# Patient Record
Sex: Male | Born: 1950
Health system: Southern US, Community
[De-identification: ages and names within clinical notes are randomized; demographics above are authoritative.]

## PROBLEM LIST (undated history)

## (undated) DIAGNOSIS — Z9989 Dependence on other enabling machines and devices: Secondary | ICD-10-CM

## (undated) DIAGNOSIS — I4891 Unspecified atrial fibrillation: Secondary | ICD-10-CM

## (undated) DIAGNOSIS — G4733 Obstructive sleep apnea (adult) (pediatric): Secondary | ICD-10-CM

## (undated) DIAGNOSIS — I779 Disorder of arteries and arterioles, unspecified: Secondary | ICD-10-CM

## (undated) DIAGNOSIS — E785 Hyperlipidemia, unspecified: Secondary | ICD-10-CM

## (undated) DIAGNOSIS — I219 Acute myocardial infarction, unspecified: Secondary | ICD-10-CM

## (undated) DIAGNOSIS — R001 Bradycardia, unspecified: Secondary | ICD-10-CM

## (undated) DIAGNOSIS — I2510 Atherosclerotic heart disease of native coronary artery without angina pectoris: Secondary | ICD-10-CM

## (undated) DIAGNOSIS — I10 Essential (primary) hypertension: Secondary | ICD-10-CM

## (undated) DIAGNOSIS — I739 Peripheral vascular disease, unspecified: Secondary | ICD-10-CM

## (undated) DIAGNOSIS — K219 Gastro-esophageal reflux disease without esophagitis: Secondary | ICD-10-CM

## (undated) DIAGNOSIS — I483 Typical atrial flutter: Secondary | ICD-10-CM

## (undated) HISTORY — DX: Peripheral vascular disease, unspecified: I73.9

## (undated) HISTORY — DX: Unspecified atrial fibrillation: I48.91

## (undated) HISTORY — DX: Typical atrial flutter: I48.3

## (undated) HISTORY — PX: KNEE ARTHROSCOPY: SHX127

## (undated) HISTORY — DX: Disorder of arteries and arterioles, unspecified: I77.9

## (undated) HISTORY — DX: Acute myocardial infarction, unspecified: I21.9

## (undated) HISTORY — DX: Hyperlipidemia, unspecified: E78.5

## (undated) HISTORY — DX: Essential (primary) hypertension: I10

## (undated) HISTORY — PX: MOLE REMOVAL: SHX2046

## (undated) HISTORY — DX: Atherosclerotic heart disease of native coronary artery without angina pectoris: I25.10

## (undated) HISTORY — DX: Bradycardia, unspecified: R00.1

## (undated) HISTORY — DX: Gastro-esophageal reflux disease without esophagitis: K21.9

---

## 2000-03-08 HISTORY — PX: CORONARY ANGIOPLASTY WITH STENT PLACEMENT: SHX49

## 2002-01-19 ENCOUNTER — Ambulatory Visit (HOSPITAL_COMMUNITY): Admission: RE | Admit: 2002-01-19 | Discharge: 2002-01-19 | Payer: Self-pay | Admitting: Cardiology

## 2002-01-19 HISTORY — PX: CARDIAC CATHETERIZATION: SHX172

## 2002-06-26 ENCOUNTER — Encounter: Payer: Self-pay | Admitting: Vascular Surgery

## 2002-06-26 ENCOUNTER — Inpatient Hospital Stay (HOSPITAL_COMMUNITY): Admission: RE | Admit: 2002-06-26 | Discharge: 2002-06-27 | Payer: Self-pay | Admitting: Vascular Surgery

## 2002-06-26 ENCOUNTER — Encounter (INDEPENDENT_AMBULATORY_CARE_PROVIDER_SITE_OTHER): Payer: Self-pay | Admitting: *Deleted

## 2002-06-26 HISTORY — PX: CAROTID ENDARTERECTOMY: SUR193

## 2006-02-02 ENCOUNTER — Ambulatory Visit: Payer: Self-pay | Admitting: *Deleted

## 2006-02-02 ENCOUNTER — Observation Stay (HOSPITAL_COMMUNITY): Admission: EM | Admit: 2006-02-02 | Discharge: 2006-02-02 | Payer: Self-pay | Admitting: Emergency Medicine

## 2006-02-02 ENCOUNTER — Ambulatory Visit: Payer: Self-pay | Admitting: Cardiology

## 2006-02-02 HISTORY — PX: CARDIAC CATHETERIZATION: SHX172

## 2006-02-16 ENCOUNTER — Ambulatory Visit: Payer: Self-pay | Admitting: *Deleted

## 2006-03-25 ENCOUNTER — Ambulatory Visit: Payer: Self-pay | Admitting: Internal Medicine

## 2006-11-02 ENCOUNTER — Ambulatory Visit: Payer: Self-pay | Admitting: Cardiology

## 2007-05-31 ENCOUNTER — Ambulatory Visit: Payer: Self-pay | Admitting: Cardiology

## 2007-05-31 ENCOUNTER — Ambulatory Visit: Payer: Self-pay | Admitting: Internal Medicine

## 2007-05-31 LAB — CONVERTED CEMR LAB
BUN: 17 mg/dL
Basophils Absolute: 0.1 10*3/uL
Basophils Relative: 1 %
CO2: 33 meq/L — ABNORMAL HIGH
Calcium: 9.8 mg/dL
Chloride: 109 meq/L
Creatinine, Ser: 1 mg/dL
Eosinophils Absolute: 0.1 10*3/uL
Eosinophils Relative: 2 %
GFR calc Af Amer: 100 mL/min
GFR calc non Af Amer: 83 mL/min
Glucose, Bld: 98 mg/dL
HCT: 43.8 %
Hemoglobin: 14.9 g/dL
INR: 1
Lymphocytes Relative: 19.5 %
MCHC: 34.1 g/dL
MCV: 90 fL
Monocytes Absolute: 0.8 10*3/uL — ABNORMAL HIGH
Monocytes Relative: 10.5 %
Neutro Abs: 4.8 10*3/uL
Neutrophils Relative %: 67 %
Platelets: 216 10*3/uL
Potassium: 3.8 meq/L
Prothrombin Time: 12.3 s
RBC: 4.87 M/uL
RDW: 12.4 %
Sodium: 146 meq/L — ABNORMAL HIGH
TSH: 0.82 u[IU]/mL
WBC: 7.2 10*3/uL
aPTT: 29.2 s

## 2007-06-01 ENCOUNTER — Inpatient Hospital Stay (HOSPITAL_BASED_OUTPATIENT_CLINIC_OR_DEPARTMENT_OTHER): Admission: RE | Admit: 2007-06-01 | Discharge: 2007-06-01 | Payer: Self-pay | Admitting: Cardiology

## 2007-06-01 ENCOUNTER — Ambulatory Visit: Payer: Self-pay | Admitting: Cardiology

## 2007-06-01 HISTORY — PX: CARDIAC CATHETERIZATION: SHX172

## 2007-10-08 ENCOUNTER — Encounter: Payer: Self-pay | Admitting: *Deleted

## 2007-10-08 DIAGNOSIS — I10 Essential (primary) hypertension: Secondary | ICD-10-CM

## 2007-10-08 DIAGNOSIS — K219 Gastro-esophageal reflux disease without esophagitis: Secondary | ICD-10-CM

## 2007-10-08 DIAGNOSIS — E785 Hyperlipidemia, unspecified: Secondary | ICD-10-CM | POA: Insufficient documentation

## 2007-10-08 DIAGNOSIS — Z9889 Other specified postprocedural states: Secondary | ICD-10-CM | POA: Insufficient documentation

## 2007-10-08 DIAGNOSIS — R0989 Other specified symptoms and signs involving the circulatory and respiratory systems: Secondary | ICD-10-CM | POA: Insufficient documentation

## 2007-10-08 HISTORY — DX: Gastro-esophageal reflux disease without esophagitis: K21.9

## 2007-10-08 HISTORY — DX: Essential (primary) hypertension: I10

## 2007-11-28 ENCOUNTER — Encounter: Payer: Self-pay | Admitting: Internal Medicine

## 2007-11-30 ENCOUNTER — Ambulatory Visit: Payer: Self-pay | Admitting: Cardiology

## 2007-12-09 ENCOUNTER — Ambulatory Visit: Payer: Self-pay | Admitting: Cardiology

## 2007-12-09 ENCOUNTER — Ambulatory Visit: Payer: Self-pay

## 2007-12-09 ENCOUNTER — Encounter: Payer: Self-pay | Admitting: Internal Medicine

## 2007-12-09 LAB — CONVERTED CEMR LAB
BUN: 15 mg/dL (ref 6–23)
CO2: 32 meq/L (ref 19–32)
Calcium: 9 mg/dL (ref 8.4–10.5)
Chloride: 110 meq/L (ref 96–112)
Creatinine, Ser: 1.3 mg/dL (ref 0.4–1.5)
GFR calc Af Amer: 74 mL/min
GFR calc non Af Amer: 61 mL/min
Glucose, Bld: 106 mg/dL — ABNORMAL HIGH (ref 70–99)
Potassium: 4.1 meq/L (ref 3.5–5.1)
Sodium: 145 meq/L (ref 135–145)

## 2008-01-13 ENCOUNTER — Ambulatory Visit: Payer: Self-pay | Admitting: Cardiology

## 2008-02-03 ENCOUNTER — Ambulatory Visit: Payer: Self-pay | Admitting: Pulmonary Disease

## 2008-02-03 DIAGNOSIS — I219 Acute myocardial infarction, unspecified: Secondary | ICD-10-CM

## 2008-02-03 DIAGNOSIS — G4733 Obstructive sleep apnea (adult) (pediatric): Secondary | ICD-10-CM | POA: Insufficient documentation

## 2008-02-03 HISTORY — DX: Obstructive sleep apnea (adult) (pediatric): G47.33

## 2008-02-03 HISTORY — DX: Acute myocardial infarction, unspecified: I21.9

## 2008-02-04 ENCOUNTER — Encounter: Payer: Self-pay | Admitting: Pulmonary Disease

## 2008-02-04 ENCOUNTER — Ambulatory Visit (HOSPITAL_BASED_OUTPATIENT_CLINIC_OR_DEPARTMENT_OTHER): Admission: RE | Admit: 2008-02-04 | Discharge: 2008-02-04 | Payer: Self-pay | Admitting: Pulmonary Disease

## 2008-02-21 ENCOUNTER — Ambulatory Visit: Payer: Self-pay | Admitting: Pulmonary Disease

## 2008-02-22 ENCOUNTER — Telehealth (INDEPENDENT_AMBULATORY_CARE_PROVIDER_SITE_OTHER): Payer: Self-pay | Admitting: *Deleted

## 2008-04-11 ENCOUNTER — Ambulatory Visit: Payer: Self-pay | Admitting: Pulmonary Disease

## 2008-05-04 ENCOUNTER — Telehealth: Payer: Self-pay | Admitting: Pulmonary Disease

## 2008-06-07 ENCOUNTER — Encounter: Payer: Self-pay | Admitting: Pulmonary Disease

## 2008-07-17 ENCOUNTER — Telehealth: Payer: Self-pay | Admitting: Internal Medicine

## 2008-07-18 ENCOUNTER — Ambulatory Visit: Payer: Self-pay | Admitting: Pulmonary Disease

## 2008-12-10 ENCOUNTER — Encounter: Payer: Self-pay | Admitting: Cardiology

## 2008-12-10 ENCOUNTER — Ambulatory Visit: Payer: Self-pay

## 2008-12-10 ENCOUNTER — Ambulatory Visit: Payer: Self-pay | Admitting: Cardiology

## 2008-12-10 LAB — CONVERTED CEMR LAB
ALT: 41 U/L (ref 0–53)
AST: 28 U/L (ref 0–37)
Albumin: 4.1 g/dL (ref 3.5–5.2)
Alkaline Phosphatase: 53 U/L (ref 39–117)
BUN: 16 mg/dL (ref 6–23)
Basophils Absolute: 0.1 K/uL (ref 0.0–0.1)
Basophils Relative: 0.9 % (ref 0.0–3.0)
Bilirubin, Direct: 0.1 mg/dL (ref 0.0–0.3)
CO2: 30 meq/L (ref 19–32)
Calcium: 9.1 mg/dL (ref 8.4–10.5)
Chloride: 107 meq/L (ref 96–112)
Cholesterol: 197 mg/dL (ref 0–200)
Creatinine, Ser: 1 mg/dL (ref 0.4–1.5)
Eosinophils Absolute: 0.2 K/uL (ref 0.0–0.7)
Eosinophils Relative: 3.1 % (ref 0.0–5.0)
GFR calc Af Amer: 99 mL/min
GFR calc non Af Amer: 82 mL/min
Glucose, Bld: 94 mg/dL (ref 70–99)
HCT: 39.3 % (ref 39.0–52.0)
HDL: 23.8 mg/dL — ABNORMAL LOW (ref >39.0)
Hemoglobin: 13.4 g/dL (ref 13.0–17.0)
LDL Cholesterol: 145 mg/dL — ABNORMAL HIGH (ref 0–99)
Lymphocytes Relative: 28.6 % (ref 12.0–46.0)
MCHC: 34.2 g/dL (ref 30.0–36.0)
MCV: 88.9 fL (ref 78.0–100.0)
Monocytes Absolute: 0.6 K/uL (ref 0.1–1.0)
Monocytes Relative: 10.9 % (ref 3.0–12.0)
Neutro Abs: 3.1 K/uL (ref 1.4–7.7)
Neutrophils Relative %: 56.5 % (ref 43.0–77.0)
Platelets: 178 K/uL (ref 150–400)
Potassium: 3.9 meq/L (ref 3.5–5.1)
RBC: 4.42 M/uL (ref 4.22–5.81)
RDW: 12.1 % (ref 11.5–14.6)
Sodium: 144 meq/L (ref 135–145)
Total Bilirubin: 1.4 mg/dL — ABNORMAL HIGH (ref 0.3–1.2)
Total CHOL/HDL Ratio: 8.3
Total Protein: 6.8 g/dL (ref 6.0–8.3)
Triglycerides: 143 mg/dL (ref 0–149)
VLDL: 29 mg/dL (ref 0–40)
WBC: 5.6 10*3/microliter (ref 4.5–10.5)

## 2008-12-21 ENCOUNTER — Telehealth: Payer: Self-pay | Admitting: Gastroenterology

## 2008-12-25 ENCOUNTER — Ambulatory Visit: Payer: Self-pay | Admitting: Gastroenterology

## 2008-12-25 DIAGNOSIS — I2510 Atherosclerotic heart disease of native coronary artery without angina pectoris: Secondary | ICD-10-CM | POA: Insufficient documentation

## 2008-12-25 DIAGNOSIS — R079 Chest pain, unspecified: Secondary | ICD-10-CM | POA: Insufficient documentation

## 2008-12-25 DIAGNOSIS — M79609 Pain in unspecified limb: Secondary | ICD-10-CM | POA: Insufficient documentation

## 2008-12-25 HISTORY — DX: Atherosclerotic heart disease of native coronary artery without angina pectoris: I25.10

## 2008-12-26 ENCOUNTER — Encounter: Payer: Self-pay | Admitting: Gastroenterology

## 2008-12-31 ENCOUNTER — Ambulatory Visit: Payer: Self-pay | Admitting: Internal Medicine

## 2008-12-31 DIAGNOSIS — IMO0001 Reserved for inherently not codable concepts without codable children: Secondary | ICD-10-CM | POA: Insufficient documentation

## 2008-12-31 LAB — CONVERTED CEMR LAB
ALT: 36 units/L (ref 0–53)
ALT: 36 units/L (ref 0–53)
AST: 23 units/L (ref 0–37)
AST: 23 units/L (ref 0–37)
Albumin: 4.2 g/dL (ref 3.5–5.2)
Albumin: 4.2 g/dL (ref 3.5–5.2)
Alkaline Phosphatase: 57 units/L (ref 39–117)
Alkaline Phosphatase: 57 units/L (ref 39–117)
BUN: 16 mg/dL (ref 6–23)
BUN: 16 mg/dL (ref 6–23)
Bilirubin, Direct: 0.2 mg/dL (ref 0.0–0.3)
Bilirubin, Direct: 0.2 mg/dL (ref 0.0–0.3)
CK-MB: 1.1 ng/mL (ref 0.3–4.0)
CK-MB: 1.1 ng/mL (ref 0.3–4.0)
CO2: 32 meq/L (ref 19–32)
CO2: 32 meq/L (ref 19–32)
Calcium: 9.8 mg/dL (ref 8.4–10.5)
Calcium: 9.8 mg/dL (ref 8.4–10.5)
Chloride: 104 meq/L (ref 96–112)
Chloride: 104 meq/L (ref 96–112)
Cholesterol, target level: 200 mg/dL
Creatinine, Ser: 1.2 mg/dL (ref 0.4–1.5)
Creatinine, Ser: 1.2 mg/dL (ref 0.4–1.5)
GFR calc Af Amer: 81 mL/min
GFR calc Af Amer: 81 mL/min
GFR calc non Af Amer: 67 mL/min
GFR calc non Af Amer: 67 mL/min
Glucose, Bld: 116 mg/dL — ABNORMAL HIGH (ref 70–99)
Glucose, Bld: 116 mg/dL — ABNORMAL HIGH (ref 70–99)
HDL goal, serum: 40 mg/dL
LDL Goal: 100 mg/dL
Potassium: 4.9 meq/L (ref 3.5–5.1)
Potassium: 4.9 meq/L (ref 3.5–5.1)
Sed Rate: 21 mm/hr — ABNORMAL HIGH (ref 0–16)
Sed Rate: 21 mm/hr — ABNORMAL HIGH (ref 0–16)
Sodium: 142 meq/L (ref 135–145)
Sodium: 142 meq/L (ref 135–145)
Total Bilirubin: 1.3 mg/dL — ABNORMAL HIGH (ref 0.3–1.2)
Total Bilirubin: 1.3 mg/dL — ABNORMAL HIGH (ref 0.3–1.2)
Total CK: 109 units/L (ref 7–195)
Total Protein: 7.3 g/dL (ref 6.0–8.3)
Total Protein: 7.3 g/dL (ref 6.0–8.3)

## 2009-03-28 ENCOUNTER — Telehealth: Payer: Self-pay | Admitting: Cardiology

## 2009-04-12 ENCOUNTER — Telehealth: Payer: Self-pay | Admitting: Cardiology

## 2009-04-12 ENCOUNTER — Ambulatory Visit: Payer: Self-pay

## 2009-04-12 ENCOUNTER — Ambulatory Visit: Payer: Self-pay | Admitting: Cardiology

## 2009-04-12 DIAGNOSIS — I498 Other specified cardiac arrhythmias: Secondary | ICD-10-CM | POA: Insufficient documentation

## 2009-08-25 ENCOUNTER — Encounter: Payer: Self-pay | Admitting: Cardiology

## 2009-11-12 ENCOUNTER — Telehealth: Payer: Self-pay | Admitting: Cardiology

## 2009-12-11 ENCOUNTER — Ambulatory Visit: Payer: Self-pay | Admitting: Cardiology

## 2009-12-11 DIAGNOSIS — R5383 Other fatigue: Secondary | ICD-10-CM

## 2009-12-11 DIAGNOSIS — R5381 Other malaise: Secondary | ICD-10-CM | POA: Insufficient documentation

## 2009-12-18 ENCOUNTER — Telehealth (INDEPENDENT_AMBULATORY_CARE_PROVIDER_SITE_OTHER): Payer: Self-pay | Admitting: *Deleted

## 2009-12-19 ENCOUNTER — Ambulatory Visit: Payer: Self-pay | Admitting: Internal Medicine

## 2009-12-19 ENCOUNTER — Encounter (HOSPITAL_COMMUNITY)
Admission: RE | Admit: 2009-12-19 | Discharge: 2010-02-18 | Payer: Self-pay | Source: Home / Self Care | Admitting: Cardiology

## 2009-12-19 ENCOUNTER — Ambulatory Visit: Payer: Self-pay

## 2009-12-19 ENCOUNTER — Ambulatory Visit: Payer: Self-pay | Admitting: Cardiology

## 2009-12-20 ENCOUNTER — Telehealth: Payer: Self-pay | Admitting: Cardiology

## 2009-12-20 LAB — CONVERTED CEMR LAB
ALT: 25 units/L (ref 0–53)
AST: 22 units/L (ref 0–37)
Albumin: 4 g/dL (ref 3.5–5.2)
Alkaline Phosphatase: 62 units/L (ref 39–117)
BUN: 16 mg/dL (ref 6–23)
Basophils Absolute: 0 10*3/uL (ref 0.0–0.1)
Basophils Relative: 0.4 % (ref 0.0–3.0)
Bilirubin, Direct: 0.1 mg/dL (ref 0.0–0.3)
CO2: 31 meq/L (ref 19–32)
Calcium: 9.5 mg/dL (ref 8.4–10.5)
Chloride: 110 meq/L (ref 96–112)
Cholesterol: 157 mg/dL (ref 0–200)
Creatinine, Ser: 1.1 mg/dL (ref 0.4–1.5)
Eosinophils Absolute: 0.2 10*3/uL (ref 0.0–0.7)
Eosinophils Relative: 3.3 % (ref 0.0–5.0)
GFR calc non Af Amer: 73.3 mL/min (ref >60)
Glucose, Bld: 103 mg/dL — ABNORMAL HIGH (ref 70–99)
HCT: 39.6 % (ref 39.0–52.0)
HDL: 31.1 mg/dL — ABNORMAL LOW (ref >39.00)
Hemoglobin: 13.3 g/dL (ref 13.0–17.0)
LDL Cholesterol: 100 mg/dL — ABNORMAL HIGH (ref 0–99)
Lymphocytes Relative: 15.9 % (ref 12.0–46.0)
Lymphs Abs: 1 10*3/uL (ref 0.7–4.0)
MCHC: 33.6 g/dL (ref 30.0–36.0)
MCV: 90.1 fL (ref 78.0–100.0)
Monocytes Absolute: 0.7 10*3/uL (ref 0.1–1.0)
Monocytes Relative: 10.6 % (ref 3.0–12.0)
Neutro Abs: 4.4 10*3/uL (ref 1.4–7.7)
Neutrophils Relative %: 69.8 % (ref 43.0–77.0)
Platelets: 160 10*3/uL (ref 150.0–400.0)
Potassium: 5.4 meq/L — ABNORMAL HIGH (ref 3.5–5.1)
RBC: 4.4 M/uL (ref 4.22–5.81)
RDW: 11.9 % (ref 11.5–14.6)
Sodium: 145 meq/L (ref 135–145)
TSH: 0.81 microintl units/mL (ref 0.35–5.50)
Total Bilirubin: 0.9 mg/dL (ref 0.3–1.2)
Total CHOL/HDL Ratio: 5
Total Protein: 7 g/dL (ref 6.0–8.3)
Triglycerides: 131 mg/dL (ref 0.0–149.0)
VLDL: 26.2 mg/dL (ref 0.0–40.0)
WBC: 6.3 10*3/uL (ref 4.5–10.5)

## 2009-12-25 ENCOUNTER — Telehealth: Payer: Self-pay | Admitting: Cardiology

## 2009-12-31 ENCOUNTER — Telehealth: Payer: Self-pay | Admitting: Cardiology

## 2010-03-10 ENCOUNTER — Telehealth: Payer: Self-pay | Admitting: Cardiology

## 2010-03-14 ENCOUNTER — Telehealth (INDEPENDENT_AMBULATORY_CARE_PROVIDER_SITE_OTHER): Payer: Self-pay | Admitting: *Deleted

## 2010-04-23 ENCOUNTER — Encounter (INDEPENDENT_AMBULATORY_CARE_PROVIDER_SITE_OTHER): Payer: Self-pay | Admitting: *Deleted

## 2010-05-30 ENCOUNTER — Encounter: Payer: Self-pay | Admitting: Cardiology

## 2010-07-31 ENCOUNTER — Telehealth (INDEPENDENT_AMBULATORY_CARE_PROVIDER_SITE_OTHER): Payer: Self-pay | Admitting: *Deleted

## 2010-11-18 NOTE — Progress Notes (Signed)
Summary: pt needs a generic med  Phone Note Call from Patient Call back at [phone]   Caller: Spouse Reason for Call: Talk to Nurse, Talk to Doctor Summary of Call: pt went to get his lipitor 80mg  and the price has gone up so high they just wanted to know is there something else he could take Initial call taken by: Omer Jack,  Mar 10, 2010 8:59 AM  Follow-up for Phone Call        I called and spoke with the pt's wife. I made her aware that we can probably change the pt to something generic, but the pt may not get as good of control on his lipids. I will discuss with Dr. Juanda Chance and call her back tomorrow.  Follow-up by: Sherri Rad, RN, BSN,  Mar 10, 2010 10:33 AM  Additional Follow-up for Phone Call Additional follow up Details #1::        Dr. Juanda Chance would like the pt to remain on lipitor. I will look at the $4 card and call the pt's wife back. Sherri Rad, RN, BSN  Mar 11, 2010 6:11 PM     Additional Follow-up for Phone Call Additional follow up Details #2::    wife calling again, [phone], Migdalia Dk  Mar 12, 2010 8:10 AM   Additional Follow-up for Phone Call Additional follow up Details #3:: Details for Additional Follow-up Action Taken: Called pt's wife back and advised that Herbert Seta and Dr.BB will be in tomorrow and look at med list. I advised her that Lipitor will go generic in about 7 months but she states that right now they cannot afford the 133.00 per month.  Additional Follow-up by: J REISS RN   Appended Document: pt needs a generic med I called Mrs. Vick at her contact # and left a message for the [phone] # or website of www. [email or web address].com/save so they can investigate the $4 co-pay card. Per Dr. Juanda Chance, he would like the pt to stay on lipitor if possible. I have also left a message that if the $4 co-pay card does not work out for him, to call us back with a list of meds that his insurance will cover at a lower co-pay.

## 2010-11-18 NOTE — Progress Notes (Signed)
Summary: sooner appt request  Phone Note Call from Patient Call back at [phone]   Caller: Spouse Call For: Dr. Russella Dar Reason for Call: Talk to Nurse Details for Reason: sooner appt request Summary of Call: pt has appt on the 9th... pt will be going out of town long term soon and wife wants to know if pt can have tests ordered without having to wait for upsoming consultation... explained to wife that doctor cannot order tests blindly with no evaluation of a patient that he has also never seen before...  Initial call taken by: Vallarie Mare,  December 21, 2008 8:22 AM  Follow-up for Phone Call        patient has never been seen here before, he has CP and has been referred by Cardiology.  Wife wants to have testing ordered prior to office visit on Tuesday.  Advised her that he need to come in and be examined and then Dr Russella Dar will determine what tests need to be performed. Follow-up by: Darcey Nora RN,  December 21, 2008 8:57 AM

## 2010-11-18 NOTE — Letter (Signed)
Summary: Results Follow-up Letter  [facility]  [address] [place], [place] [address]   Phone: [phone]  Fax: [phone]    12/31/2008  [address] [place], [place]  [address]  Dear Mr. HARTSTEIN,   The following are the results of your recent test(s):  Test     Result     blood sugar     slightly elevated liver/kidney   normal muscle enzyme   normal   _________________________________________________________  Please call for an appointment as directed _________________________________________________________ _________________________________________________________ _________________________________________________________  Sincerely,  Sanda Linger MD [facility]

## 2010-11-18 NOTE — Progress Notes (Signed)
  Faxed ROI over to [facility] @ [phone] [facility]  July 31, 2010 8:49 AM     Appended Document:  Release faxed to [facility] not [facility], records received back gave to [place]

## 2010-11-18 NOTE — Assessment & Plan Note (Signed)
Summary: rov for osa   Referred by:  Charlies Constable  Chief Complaint:  Pt is here for a follow up appt.  Pt states he uses his cpap machine every night- approx 8 hours per night.  Pt denied any new complaints. .  History of Present Illness: pt doing well with cpap.  He is sleeping well, and has seen improved daytime alertness.  He is having no difficulties with the mask or cpap pressure.  He comes in today for his yearly  visit.     Current Allergies: * NIASPAN     Review of Systems      See HPI   Vital Signs:  Patient Profile:   60 Years Old Male Weight:      208.25 pounds O2 Sat:      97 % O2 treatment:    Room Air Temp:     97.9 degrees F oral Pulse rate:   71 / minute BP sitting:   122 / 86  (right arm) Cuff size:   regular  Vitals Entered By: Cyndia Diver LPN (July 18, 2008 2:46 PM)             Comments Medications reviewed with patient Cyndia Diver LPN  July 18, 2008 2:46 PM      Physical Exam  General:     wd male in nad Nose:     no skin breakdown or pressure necrosis from the cpap mask      Impression & Recommendations:  Problem # 1:  OBSTRUCTIVE SLEEP APNEA (ICD-327.23) the pt is doing well with cpap, and has seen the benefit from therapy.  I have asked him to continue on cpap, and work on modest weight loss.  We will have his machine set to 11cm based on 2 wks of data from autotitrating device.   Patient Instructions: 1)  Please schedule a follow-up appointment in 1 year.   ]

## 2010-11-18 NOTE — Progress Notes (Signed)
Summary: refill folbee  Phone Note Refill Request   Refills Requested: Medication #1:  FOLIC ACID-VIT B6-VIT B12 0.4-50-0.1 MG  TABS Take 1 tablet by mouth once a day   Supply Requested: 1 year [employer]   Method Requested: Fax to Local Pharmacy Initial call taken by: Migdalia Dk,  November 12, 2009 12:12 PM  Follow-up for Phone Call        pharmacy called to verfiy med explained -which folbic pt is on . I called to see if he received his med ,there was no anwer so I left message for pt to call me back to make sure He got his med Follow-up by: Burnett Kanaris, CNA,  November 15, 2009 12:52 PM

## 2010-11-18 NOTE — Progress Notes (Signed)
Summary: set up f/u appt   ---- Converted from flag ---- ---- 12/28/2009 12:18 AM, Lenoria Farrier, MD, [facility] wrote: Herbert Seta, Can you schedule an apt week after next.  WE may need to consider cath. BB  ---- 12/25/2009 12:42 PM, Sherri Rad, RN, BSN wrote: Please review 12/25/09 phone note. ------------------------------  Phone Note Outgoing Call   Call placed by: Sherri Rad, RN, BSN,  December 31, 2009 12:42 PM Call placed to: spouse Summary of Call: I called and left Mrs. Lor a message to call back regarding setting up Mr. Ambs for an appt. Sherri Rad, RN, BSN  December 31, 2009 12:42 PM   Follow-up for Phone Call        returning [phone] or [phone], Migdalia Dk  January 01, 2010 12:08 PM   The pt's wife called back. She states the pt has had caths before that have been negative. He is very frustrated with not being able to find out what is going on. The pt is still out of town. I explained to Mrs. Francisco to talk with the pt. If he wants to see Dr. Juanda Chance when he comes back in town we will set his appt. up. If he wants to wait it out for now, then that is fine. Mrs. Hartog will talk to the pt. and they will call me back when they are ready with what they want to do. Follow-up by: Sherri Rad, RN, BSN,  January 01, 2010 4:06 PM

## 2010-11-18 NOTE — Progress Notes (Signed)
Summary: Patient's at Home Med List  Patient's at Home Med List   Imported By: Marylou Mccoy 01/31/2010 13:42:18  _____________________________________________________________________  External Attachment:    Type:   Image     Comment:   External Document

## 2010-11-18 NOTE — Assessment & Plan Note (Signed)
Summary: yearly/sl   Visit Type:  Follow-up Referring Provider:  Charlies Constable, MD Primary Provider:  Illene Regulus MD  CC:  no complaints.  History of Present Illness: The patient is 60 years old and return for management of CAD. In 2002 he had a bare metal stent placed to the right coronary artery. A catheterization in 2008 which showed nondestructive disease. Had a Myoview scan in 2009 which was negative for ischemia.  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 He says he is having increased fatigue over the last several weeks and he also says he has had some diaphoresis at night. He was concerned about his heart rate got some of the symptoms may be related to working harder  His past history is significant for hypertension and hyperlipidemia. He also had previous carotid enterectomy. He had duplex studies of his carotid one years ago with mild disease and followup recommended 2 years from that point.  Current Medications (verified): 1)  Altace 10 Mg Caps (Ramipril) .... Take 1 Capsule Bid 2)  Hydrochlorothiazide 50 Mg Tabs (Hydrochlorothiazide) .... Take 1/2 Tab Once Daily 3)  K-Tabs 10 Meq  Tbcr (Potassium Chloride) .... Take 2 Tabs Two Times A Day 4)  Tenormin 50 Mg  Tabs (Atenolol) .... Take 1/2 Tab By Mouth Once Daily 5)  Plavix 75 Mg  Tabs (Clopidogrel Bisulfate) .... Take One Tablet Once Daily 6)  Baby Aspirin 81 Mg Chew (Aspirin) 7)  Nitroquick 0.4 Mg  Subl (Nitroglycerin) .... Take Sl As As Needed and As Directed For Chest Pains. 8)  Amlodipine Besylate 5 Mg  Tabs (Amlodipine Besylate) .... Take 1 Tablet By Mouth Once A Day 9)  Prilosec 40 Mg Cpdr (Omeprazole) .Marland Kitchen.. 1 Tab Once Daily 10)  Folic Acid-Vit B6-Vit B12 0.4-50-0.1 Mg  Tabs (Folic Acid-Vit B6-Vit B12) .... Take 1 Tablet By Mouth Once A Day 11)  Imdur 30 Mg Xr24h-Tab (Isosorbide Mononitrate) .... Take 1 Tablet By Mouth Once A Day 12)  Lipitor 80 Mg Tabs (Atorvastatin Calcium)  .... Once Daily  Allergies (verified): 1)  * Niaspan  Past History:  Past Medical History: Reviewed history from 04/12/2009 and no changes required. Current Problems:  CORONARY ARTERY DISEASE (ICD-414.00) Hx of MYOCARDIAL INFARCTION (ICD-410.90) CHEST PAIN (ICD-786.50) BRADYCARDIA (ICD-427.89) HYPERTENSION (ICD-401.9) CAROTID BRUIT, RIGHT (ICD-785.9) HYPERLIPIDEMIA (ICD-272.4) GASTROESOPHAGEAL REFLUX DISEASE (ICD-530.81) MUSCLE PAIN (ICD-729.1) LEG PAIN (ICD-729.5) SPECIAL SCREENING FOR MALIGNANT NEOPLASMS COLON (ICD-V76.51) OBSTRUCTIVE SLEEP APNEA (ICD-327.23) ARTHROSCOPY, RIGHT KNEE, HX OF (ICD-V45.89) CAROTID ENDARTERECTOMY, RIGHT, HX OF (ICD-V15.1)  Review of Systems       ROS is negative except as outlined in HPI.   Vital Signs:  Patient profile:   60 year old male Height:      73 inches Weight:      193 pounds BMI:     25.56 Pulse rate:   67 / minute BP sitting:   120 / 73  (left arm) Cuff size:   large  Vitals Entered By: Burnett Kanaris, CNA (December 11, 2009 2:42 PM)  Physical Exam  Additional Exam:  Gen. Well-nourished, in no distress   Neck: No JVD, thyroid not enlarged, carotid endarterectomy scar Lungs: No tachypnea, clear without rales, rhonchi or wheezes Cardiovascular: Rhythm regular, PMI not displaced,  heart sounds  normal, no murmurs or gallops, no peripheral edema, pulses normal in all 4 extremities. Abdomen: BS normal, abdomen soft and non-tender without masses or organomegaly, no hepatosplenomegaly. MS: No deformities, no cyanosis or clubbing  Neuro:  No focal sns   Skin:  no lesions    Impression & Recommendations:  Problem # 1:  FATIGUE / MALAISE (ICD-780.79)  The patient has had symptoms of fatigue and diaphoresis. These could possibly bet is an ischemic equivalent. We will plan to evaluate this further with a rest chest exercise Myoview scan. We'll also get some blood work to evaluate for symptoms of fatigue. If all this is negative  we won't pursue further evaluation.  Orders: EKG w/ Interpretation (93000) Nuclear Stress Test (Nuc Stress Test)  Problem # 2:  CORONARY ARTERY DISEASE (ICD-414.00)  He had bare metal stent placed to the right coronary 2002 and had a negative catheter in 2008. We plan evaluation as outlined above. His updated medication list for this problem includes:    Altace 10 Mg Caps (Ramipril) .Marland Kitchen... Take 1 capsule bid    Tenormin 50 Mg Tabs (Atenolol) .Marland Kitchen... Take 1/2 tab by mouth once daily    Plavix 75 Mg Tabs (Clopidogrel bisulfate) .Marland Kitchen... Take one tablet once daily    Baby Aspirin 81 Mg Chew (Aspirin)    Nitroquick 0.4 Mg Subl (Nitroglycerin) .Marland Kitchen... Take sl as as needed and as directed for chest pains.    Amlodipine Besylate 5 Mg Tabs (Amlodipine besylate) .Marland Kitchen... Take 1 tablet by mouth once a day    Imdur 30 Mg Xr24h-tab (Isosorbide mononitrate) .Marland Kitchen... Take 1 tablet by mouth once a day  His updated medication list for this problem includes:    Altace 10 Mg Caps (Ramipril) .Marland Kitchen... Take 1 capsule bid    Tenormin 50 Mg Tabs (Atenolol) .Marland Kitchen... Take 1/2 tab by mouth once daily    Plavix 75 Mg Tabs (Clopidogrel bisulfate) .Marland Kitchen... Take one tablet once daily    Baby Aspirin 81 Mg Chew (Aspirin)    Nitroquick 0.4 Mg Subl (Nitroglycerin) .Marland Kitchen... Take sl as as needed and as directed for chest pains.    Amlodipine Besylate 5 Mg Tabs (Amlodipine besylate) .Marland Kitchen... Take 1 tablet by mouth once a day    Imdur 30 Mg Xr24h-tab (Isosorbide mononitrate) .Marland Kitchen... Take 1 tablet by mouth once a day  Orders: EKG w/ Interpretation (93000) Nuclear Stress Test (Nuc Stress Test)  Problem # 3:  HYPERTENSION (ICD-401.9)  His updated medication list for this problem includes:    Altace 10 Mg Caps (Ramipril) .Marland Kitchen... Take 1 capsule bid    Hydrochlorothiazide 50 Mg Tabs (Hydrochlorothiazide) .Marland Kitchen... Take 1/2 tab once daily    Tenormin 50 Mg Tabs (Atenolol) .Marland Kitchen... Take 1/2 tab by mouth once daily    Baby Aspirin 81 Mg Chew (Aspirin)     Amlodipine Besylate 5 Mg Tabs (Amlodipine besylate) .Marland Kitchen... Take 1 tablet by mouth once a day  His updated medication list for this problem includes:    Altace 10 Mg Caps (Ramipril) .Marland Kitchen... Take 1 capsule bid    Hydrochlorothiazide 50 Mg Tabs (Hydrochlorothiazide) .Marland Kitchen... Take 1/2 tab once daily    Tenormin 50 Mg Tabs (Atenolol) .Marland Kitchen... Take 1/2 tab by mouth once daily    Baby Aspirin 81 Mg Chew (Aspirin)    Amlodipine Besylate 5 Mg Tabs (Amlodipine besylate) .Marland Kitchen... Take 1 tablet by mouth once a day  Problem # 4:  CAROTID ENDARTERECTOMY, RIGHT, HX OF (ICD-V15.1) He had remote carotid endarterectomy and had Doppler studies a year ago which showed mild disease. The recommended followup is one year from now.  Patient Instructions: 1)  Your physician recommends that you return for lab work the day of your stress test: lipid/liver/bmet/cbc/tsh (  414.01;272.2;401.1) 2)  Your physician wants you to follow-up in: 1 year with Dr. Lisette Grinder will receive a reminder letter in the mail two months in advance. If you don't receive a letter, please call our office to schedule the follow-up appointment. 3)  Your physician has requested that you have an exercise stress myoview.  For further information please visit [email or web address]  Please follow instruction sheet, as given.

## 2010-11-18 NOTE — Letter (Signed)
Summary: Generic Letter  [facility], Main Office  [address]   [place], [place] [address]   Phone: [phone]  Fax: [phone]        April 23, 2010 MRN: [id number]    [facility] [address] [place], [place]  [address]    Dear Mr. GROSSER,  According to our records, you are due for a repeat lipid and liver panel since we have changed your cholesterol medication. Please call our office and we will be glad to set this appointment up for you, or you may take the enclosed lab order and have your bloodwork drawn closer to home. Do not eat or drink anything after midnight the night before your labs are drawn. If you have your labs drawn elsewhere, please encourage the lab to fax the results to our office at [phone].         Sincerely,  Sherri Rad, RN, BSN  This letter has been electronically signed by your physician.

## 2010-11-18 NOTE — Progress Notes (Signed)
Summary: returning call  Phone Note Call from Patient Call back at [phone]   Caller: Spouse Reason for Call: Talk to Nurse Summary of Call: returning call, request call back asap, almost out of meds Initial call taken by: Migdalia Dk,  Mar 14, 2010 8:19 AM  Follow-up for Phone Call        Talked with Mrs. Mancino.  She said they do not qualify for the online program of $4.00 for Lipitor.  Their insurance will cover Lovastatin, pravastatin, and simvastatin.  I spoke with Dr. Juanda Chance and medication was changed to Simvastatin 40mg  daily.  Mrs. Jelley is aware and prescription called in. Lisabeth Devoid RN Pt returning call  pt want a 90 day supply Judie Grieve  Mar 14, 2010 9:23 AM    New/Updated Medications: SIMVASTATIN 40 MG TABS (SIMVASTATIN) take 1 tablet daily Prescriptions: SIMVASTATIN 40 MG TABS (SIMVASTATIN) take 1 tablet daily  #90 x 3   Entered by:   Lisabeth Devoid RN   Authorized by:   Lenoria Farrier, MD, [facility]   Signed by:   Lisabeth Devoid RN on 03/14/2010   Method used:   Electronically to        [facility] [id number] (retail)       [address] [place], [place]  [address]       Ph: [phone]       Fax: [phone]   RxID:   [id number]

## 2010-11-18 NOTE — Progress Notes (Signed)
Summary: Nuclear Pre-Procedure  Phone Note Outgoing Call   Call placed by: Milana Na, EMT-P,  December 18, 2009 4:18 PM Summary of Call: Left message with information on Myoview Information Sheet (see scanned document for details).      Nuclear Med Background Indications for Stress Test: Evaluation for Ischemia, Stent Patency   History: Heart Catheterization, Myocardial Infarction, Myocardial Perfusion Study, Stents  History Comments: '02 STENTS RCA  MI '08 Heart Cath N/O CAD '09 MPS (-) ischemia EF 59%  Symptoms: Diaphoresis, Fatigue, Rapid HR    Nuclear Pre-Procedure Cardiac Risk Factors: Carotid Disease, Hypertension, Lipids Height (in): 73  Nuclear Med Study Referring MD:  B.Juanda Chance

## 2010-11-18 NOTE — Progress Notes (Signed)
Summary: NEED RESULTS  Phone Note Call from Patient Call back at [phone]   Caller: Spouse Call For: [facility] Summary of Call: CALLING FOR SLEEP STUDY RESULTS  Initial call taken by: Rickard Patience,  Feb 22, 2008 9:04 AM  Follow-up for Phone Call        Pt had sleep study done 02-04-08.  Results still not in e chart.  Have you read yet?  Please advise.  Thanks! Follow-up by: Vernie Murders,  Feb 22, 2008 9:38 AM  Additional Follow-up for Phone Call Additional follow up Details #1::        pt needs ov to discuss sleep study results per dr. Shelle Iron ....Marland KitchenMarland KitchenCyndia Diver LPN  Feb 22, 4539 10:54 AM   Pt called back and Nicholos Johns is going to schedule f/u appt w/ KC to discuss. Additional Follow-up by: Michel Bickers CMA,  Feb 23, 2008 10:07 AM    Additional Follow-up for Phone Call Additional follow up Details #2::    atc pt at [phone] na.  lmom at home tcb Vernie Murders  Feb 22, 2008 12:05 PM

## 2010-11-18 NOTE — Progress Notes (Signed)
Summary: test results (discuss w/ BB)  Phone Note Call from Patient Call back at Home Phone [phone] Call back at Work Phone [phone] Call back at [phone]   Caller: Spouse Reason for Call: Talk to Nurse, Lab or Test Results Initial call taken by: Lorne Skeens,  December 25, 2009 11:43 AM  Follow-up for Phone Call        I called and spoke with the pt's wife and made her aware that the pt's myoview is normal. She is still concerned b/c the pt has episodes of cp every month to 3 months and they end up in the ER with negative tests. She stated that the pt is on C-PAP and has been for a while. I explained there is a possibility that this may need to be readjusted. I will discuss with Dr. Juanda Chance and call her back next week.  Follow-up by: Sherri Rad, RN, BSN,  December 25, 2009 12:42 PM

## 2010-11-18 NOTE — Assessment & Plan Note (Signed)
Summary: Cardiology Nuclear Study  Nuclear Med Background Indications for Stress Test: Evaluation for Ischemia, Stent Patency   History: Heart Catheterization, Myocardial Infarction, Myocardial Perfusion Study, Stents  History Comments: '02 STENTS RCA  MI '08 Heart Cath N/O CAD '09 MPS (-) ischemia EF 59%  Symptoms: Chest Pain, Diaphoresis, Dizziness, Fatigue, Fatigue with Exertion, Light-Headedness, Rapid HR    Nuclear Pre-Procedure Cardiac Risk Factors: Carotid Disease, Family History - CAD, History of Smoking, Hypertension, Lipids Caffeine/Decaff Intake: None NPO After: 12:00 AM Lungs: clear IV 0.9% NS with Angio Cath: 22g     IV Site: (R) AC IV Started by: Irean Hong RN Chest Size (in) 44     Height (in): 73 Weight (lb): 188 BMI: 24.89 Tech Comments: Tenormin this am, HR=58,and the patient complaining of bilateral calf and knee pain, changed to lexiscan.Patsy Edwards,RN.  Nuclear Med Study 1 or 2 day study:  1 day     Stress Test Type:  Eugenie Birks Reading MD:  Dietrich Pates, MD     Referring MD:  B.Brodie Resting Radionuclide:  Technetium 20m Tetrofosmin     Resting Radionuclide Dose:  11.0 mCi  Stress Radionuclide:  Technetium 42m Tetrofosmin     Stress Radionuclide Dose:  33.0 mCi   Stress Protocol      Max HR:  78 bpm Max Systolic BP: 135 mm HgRate Pressure Product:  43329  Lexiscan: 0.4 mg   Stress Test Technologist:  Irean Hong RN     Nuclear Technologist:  Harlow Asa CNMT  Rest Procedure  Myocardial perfusion imaging was performed at rest 45 minutes following the intravenous administration of Myoview Technetium 9m Tetrofosmin.  Stress Procedure  The patient received IV Lexiscan 0.4 mg over 15-seconds.  Myoview injected at 30-seconds.  There were no significant changes with infusion.  Quantitative spect images were obtained after a 45 minute delay.  QPS Raw Data Images:  Soft tissue (diaphragm) underlies heart. Stress Images:  There is normal uptake  in all areas. Rest Images:  Normal homogeneous uptake in all areas of the myocardium. Subtraction (SDS):  No evidence of ischemia. Transient Ischemic Dilatation:  1.10  (Normal <1.22)  Lung/Heart Ratio:  .31  (Normal <0.45)  Quantitative Gated Spect Images QGS EDV:  116 ml QGS ESV:  52 ml QGS EF:  55 %   Overall Impression  Exercise Capacity: Lexiscan protocol BP Response: Normal blood pressure response. Clinical Symptoms: Short of breath ECG Impression: No significant ST segment change suggestive of ischemia. Overall Impression: Normal stress nuclear study.  Appended Document: Cardiology Nuclear Study No answer x 10 rings.  Appended Document: Cardiology Nuclear [facility].  Appended Document: Cardiology Nuclear Study Pt wife is aware of results.

## 2010-11-18 NOTE — Progress Notes (Signed)
Summary: phone note   ---- Converted from flag ---- ---- 02/22/2008 9:36 AM, Cyndia Diver LPN wrote: pt needs to schedule ov to discuss sleep study results with dr. Shelle Iron ------------------------------  [facility] and schedule ov....................Marland Kitchen Cyndia Diver LPN  Feb 21, 1609 9:36 AM   [facility] 02-27-08 Cloyde Reams, RN   See triage phone note from 02-22-2008 stating pt will call us to schedule ov with Dr. Shelle Iron to discuss sleep study results...........Marland Kitchen Cyndia Diver LPN  Mar 05, 2008 10:39 AM

## 2010-11-18 NOTE — Progress Notes (Signed)
Summary: returning call   Phone Note Call from Patient Call back at [phone]   Caller: Spouse Reason for Call: Talk to Nurse Summary of Call: returning call, call after 1:30 Initial call taken by: Migdalia Dk,  December 20, 2009 11:27 AM  Follow-up for Phone Call        I spoke with the pt's wife. She states the pt has only been taking potassium two times a day. The pt will be going out of town on [place] for a month. I instructed her to have the pt hold his potassium the rest of today and through sunday, then restart potassium at once daily. The pt's wife verbalizes understanding. Follow-up by: Sherri Rad, RN, BSN,  December 20, 2009 2:36 PM    New/Updated Medications: POTASSIUM CHLORIDE CR 10 MEQ CR-CAPS (POTASSIUM CHLORIDE) Take one tablet by mouth daily

## 2010-11-21 ENCOUNTER — Encounter: Payer: Self-pay | Admitting: Cardiovascular Disease

## 2010-11-24 ENCOUNTER — Encounter: Payer: Self-pay | Admitting: Cardiovascular Disease

## 2010-11-24 ENCOUNTER — Ambulatory Visit (INDEPENDENT_AMBULATORY_CARE_PROVIDER_SITE_OTHER): Payer: BC Managed Care – PPO | Admitting: Cardiovascular Disease

## 2010-11-24 DIAGNOSIS — I739 Peripheral vascular disease, unspecified: Secondary | ICD-10-CM

## 2010-11-24 DIAGNOSIS — I779 Disorder of arteries and arterioles, unspecified: Secondary | ICD-10-CM | POA: Insufficient documentation

## 2010-11-24 DIAGNOSIS — I2510 Atherosclerotic heart disease of native coronary artery without angina pectoris: Secondary | ICD-10-CM

## 2010-11-24 DIAGNOSIS — I10 Essential (primary) hypertension: Secondary | ICD-10-CM

## 2010-11-24 DIAGNOSIS — E7800 Pure hypercholesterolemia, unspecified: Secondary | ICD-10-CM

## 2010-11-24 DIAGNOSIS — I6529 Occlusion and stenosis of unspecified carotid artery: Secondary | ICD-10-CM

## 2010-11-25 ENCOUNTER — Telehealth: Payer: Self-pay | Admitting: Cardiovascular Disease

## 2010-11-26 ENCOUNTER — Telehealth: Payer: Self-pay | Admitting: Cardiovascular Disease

## 2010-11-26 NOTE — Miscellaneous (Signed)
  Clinical Lists Changes  Observations: Added new observation of NUCLEAR NOS: Exercise Capacity: Lexiscan protocol BP Response: Normal blood pressure response. Clinical Symptoms: Short of breath ECG Impression: No significant ST segment change suggestive of ischemia. Overall Impression: Normal stress nuclear study.   (12/19/2009 11:11)      Nuclear Study  Procedure date:  12/19/2009  Findings:      Exercise Capacity: Lexiscan protocol BP Response: Normal blood pressure response. Clinical Symptoms: Short of breath ECG Impression: No significant ST segment change suggestive of ischemia. Overall Impression: Normal stress nuclear study.

## 2010-12-04 NOTE — Assessment & Plan Note (Signed)
Summary: F1Y/PER PT WIFE CALL=MJ   Visit Type:  1 yr follow up Referring Provider:  Charlies Constable, MD Primary Provider:  Illene Regulus MD  CC:  occassionally has burning in chest. no other complaints today.  History of Present Illness: 60 yo WM with history of CAD, HTN, hyperlipidemia, carotid artery disease s/p CEA who is here today for routine cardiac follow up. He has been followed in the past by Dr. Juanda Chance.  In 2002 he had a bare metal stent placed to the right coronary artery. A catheterization in 2008 which showed nondestructive disease. Had a Myoview scan in March 2011 which was negative for ischemia. He was last seen by Dr. Juanda Chance in February 2011. He had duplex studies of his carotid in 2010  with mild disease and followup recommended 2 years from that point. Lipids in August at outside facility with total chol of 191, TG 133, HDL of 31, LDL 133.  Liver function tests were normal.   He tells me that he has been doing well. He has occasional heart burn. This is mostly at night while laying down. No associated SOB, N/V, diaphoresis. He denies dizziness, palpitations, near sycope or syncope.  He was admitted to [facility] in June 2011 with chest pain and had a negative workup per the pt including a stress test.   Current Medications (verified): 1)  Altace 10 Mg Caps (Ramipril) .... Take 1 Capsule Bid 2)  Hydrochlorothiazide 50 Mg Tabs (Hydrochlorothiazide) .... Take 1/2 Tab Once Daily 3)  Potassium Chloride Cr 10 Meq Cr-Caps (Potassium Chloride) .... Take One Tablet By Mouth Daily 4)  Tenormin 50 Mg  Tabs (Atenolol) .... Take 1/2 Tab By Mouth Once Daily 5)  Plavix 75 Mg  Tabs (Clopidogrel Bisulfate) .... Take One Tablet Once Daily 6)  Baby Aspirin 81 Mg Chew (Aspirin) 7)  Nitroquick 0.4 Mg  Subl (Nitroglycerin) .... Take Sl As As Needed and As Directed For Chest Pains. 8)  Folic Acid-Vit B6-Vit B12 0.4-50-0.1 Mg  Tabs (Folic Acid-Vit B6-Vit B12) .... Take 1 Tablet By Mouth Once A  Day 9)  Imdur 30 Mg Xr24h-Tab (Isosorbide Mononitrate) .... Take 1 Tablet By Mouth Once A Day 10)  Simvastatin 40 Mg Tabs (Simvastatin) .... Take 1 Tablet Daily  Allergies (verified): 1)  * Niaspan  Past History:  Past Medical History: Current Problems:  CORONARY ARTERY DISEASE (ICD-414.00) Hx of MYOCARDIAL INFARCTION (ICD-410.90) CHEST PAIN (ICD-786.50) BRADYCARDIA (ICD-427.89) HYPERTENSION (ICD-401.9) Carotid artery disease-right CEA  HYPERLIPIDEMIA (ICD-272.4) GASTROESOPHAGEAL REFLUX DISEASE (ICD-530.81) MUSCLE PAIN (ICD-729.1) LEG PAIN (ICD-729.5) SPECIAL SCREENING FOR MALIGNANT NEOPLASMS COLON (ICD-V76.51) OBSTRUCTIVE SLEEP APNEA (ICD-327.23) ARTHROSCOPY, RIGHT KNEE, HX OF (ICD-V45.89) CAROTID ENDARTERECTOMY, RIGHT, HX OF (ICD-V15.1)  Past Surgical History: ARTHROSCOPY, RIGHT KNEE, HX OF (ICD-V45.89) CAROTID ENDARTERECTOMY, RIGHT, HX OF (ICD-V15.1) Bare-metal stent placed in the  right coronary artery in 2002 in [place], [place]  and had a cath in 2008 Tonsillectomy  Family History: Reviewed history from 04/12/2009 and no changes required.  His father had an MI.    Mother alive, with PVD, pacemer,  had a carotid endarterectomy.  Two brothers alive with hypertension.  Social History: Patient states former smoker. Quit 2002. Pt is married. 1 child. 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 No changes. Will add PPI-Protonix 40 mg by mouth Qdaily.   The following medications were removed from the medication list:    Amlodipine Besylate 5 Mg Tabs (Amlodipine  besylate) .Marland Kitchen... Take 1 tablet by mouth once a day His updated medication list for this problem includes:    Altace 10 Mg Caps (Ramipril) .Marland Kitchen... Take 1 capsule bid    Tenormin 50 Mg Tabs (Atenolol) .Marland Kitchen... Take 1/2 tab by mouth once daily    Plavix 75 Mg Tabs (Clopidogrel bisulfate) .Marland Kitchen... Take one tablet once daily    Baby Aspirin 81 Mg Chew (Aspirin)    Nitroquick 0.4 Mg Subl (Nitroglycerin) .Marland Kitchen... Take sl as as needed and as directed for chest pains.    Imdur 30 Mg Xr24h-tab (Isosorbide mononitrate) .Marland Kitchen... Take 1 tablet by mouth once a day    Amlodipine Besylate 5 Mg Tabs (Amlodipine besylate) .Marland Kitchen... Take one tablet by mouth daily.  The following medications were removed from the medication list:    Amlodipine Besylate 5 Mg Tabs (Amlodipine besylate) .Marland Kitchen... Take 1 tablet by mouth once a day His updated medication list for this problem includes:    Altace 10 Mg Caps (Ramipril) .Marland Kitchen... Take 1 capsule bid    Tenormin 50 Mg Tabs (Atenolol) .Marland Kitchen... Take 1/2 tab by mouth once daily    Plavix 75 Mg Tabs (Clopidogrel bisulfate) .Marland Kitchen... Take one tablet once daily    Baby Aspirin 81 Mg Chew (Aspirin)    Nitroquick 0.4 Mg Subl (Nitroglycerin) .Marland Kitchen... Take sl as as needed and as directed for chest pains.    Imdur 30 Mg Xr24h-tab (Isosorbide mononitrate) .Marland Kitchen... Take 1 tablet by mouth once a day  Problem # 2:  HYPERTENSION (ICD-401.9) Uncontrolled. Will add Norvasc 5 mg by mouth Qdaily.   The following medications were removed from the medication list:    Amlodipine Besylate 5 Mg Tabs (Amlodipine besylate) .Marland Kitchen... Take 1 tablet by mouth once a day His updated medication list for this problem includes:    Altace 10 Mg Caps (Ramipril) .Marland Kitchen... Take 1 capsule bid    Hydrochlorothiazide 50 Mg Tabs (Hydrochlorothiazide) .Marland Kitchen... Take 1/2 tab once daily    Tenormin 50 Mg Tabs (Atenolol) .Marland Kitchen... Take 1/2 tab by mouth once daily    Baby Aspirin 81 Mg Chew (Aspirin)    Amlodipine Besylate 5 Mg Tabs (Amlodipine besylate) .Marland Kitchen... Take  one tablet by mouth daily.  The following medications were removed from the medication list:    Amlodipine Besylate 5 Mg Tabs (Amlodipine besylate) .Marland Kitchen... Take 1 tablet by mouth once a day His updated medication list for this problem includes:    Altace 10 Mg Caps (Ramipril) .Marland Kitchen... Take 1 capsule bid    Hydrochlorothiazide 50 Mg Tabs (Hydrochlorothiazide) .Marland Kitchen... Take 1/2 tab once daily    Tenormin 50 Mg Tabs (Atenolol) .Marland Kitchen... Take 1/2 tab by mouth once daily    Baby Aspirin 81 Mg Chew (Aspirin)  Problem # 3:  HYPERLIPIDEMIA (ICD-272.4) Not well controlled on Zocor. Will stop Zocor and start Crestor 20 mg by mouth at bedtime. Will give samples of Crestor and co-pay card today.  Repeat lipids and LFTs in 12 weeks.   The following medications were removed from the medication list:  Simvastatin 40 Mg Tabs (Simvastatin) .Marland Kitchen... Take 1 tablet daily His updated medication list for this problem includes:    Crestor 20 Mg Tabs (Rosuvastatin calcium) .Marland Kitchen... Take one tablet by mouth daily.  The following medications were removed from the medication list:    Simvastatin 40 Mg Tabs (Simvastatin) .Marland Kitchen... Take 1 tablet daily  Problem # 4:  CAROTID ARTERY DISEASE (ICD-433.10) s/p CEA. Repeat dopplers this spring, 2 year follow up.   His updated medication list for this problem includes:    Plavix 75 Mg Tabs (Clopidogrel bisulfate) .Marland Kitchen... Take one tablet once daily    Baby Aspirin 81 Mg Chew (Aspirin)  His updated medication list for this problem includes:    Plavix 75 Mg Tabs (Clopidogrel bisulfate) .Marland Kitchen... Take one tablet once daily    Baby Aspirin 81 Mg Chew (Aspirin)  Orders: EKG w/ Interpretation (93000) Carotid Duplex (Carotid Duplex)  Patient Instructions: 1)  Your physician recommends that you schedule a follow-up appointment in: 6 months 2)  Your physician recommends that you return for a FASTING lipid profile and liver function in 12 weeks.  3)  Your physician has recommended you make the  following change in your medication: STOP ZOCOR and START Crestor 20mg  by mouth daily. START Norvasc 5mg  by mouth daily. START Protonix 40mg  by mouth daily. 4)  Your physician has requested that you have a carotid duplex. This test is an ultrasound of the carotid arteries in your neck. It looks at blood flow through these arteries that supply the brain with blood. Allow one hour for this exam. There are no restrictions or special instructions. Prescriptions: PROTONIX 40 MG TBEC (PANTOPRAZOLE SODIUM) take one tablet by mouth daily.  #30 x 8   Entered by:   Whitney Maeola Sarah RN   Authorized by:   Verne Carrow, MD   Signed by:   Ellender Hose RN on 11/24/2010   Method used:   Electronically to        [facility] [id number] (retail)       [address]       [place], [place]  [address]       Ph: [phone]       Fax: [phone]   RxID:   [id number] AMLODIPINE BESYLATE 5 MG TABS (AMLODIPINE BESYLATE) take one tablet by mouth daily.  #30 x 8   Entered by:   Whitney Maeola Sarah RN   Authorized by:   Verne Carrow, MD   Signed by:   Ellender Hose RN on 11/24/2010   Method used:   Electronically to        [facility] [id number] (retail)       [address] [place], [place]  [address]       Ph: [phone]       Fax: [phone]   RxID:   [id number]

## 2010-12-04 NOTE — Progress Notes (Signed)
Summary: med was changed has question  Phone Note Call from Patient   Caller: Spouse-[phone]-work- loretta Reason for Call: Talk to Nurse Summary of Call: has question regarding on meds that was changed.  Initial call taken by: Lorne Skeens,  November 25, 2010 9:13 AM  Follow-up for Phone Call        Wife had questions regarding medication changes. She is aware & understands the chagnes we made yesterday to her husband's medications. Whitney Maeola Sarah RN  November 25, 2010 11:38 AM  Follow-up by: Whitney Maeola Sarah RN,  November 25, 2010 11:39 AM

## 2010-12-15 ENCOUNTER — Telehealth: Payer: Self-pay | Admitting: Cardiovascular Disease

## 2010-12-17 ENCOUNTER — Encounter: Payer: Self-pay | Admitting: Cardiovascular Disease

## 2010-12-25 NOTE — Miscellaneous (Signed)
Summary: folbic  Clinical Lists Changes Pt 's  rx was called into pharmacy  [phone] >>>>I spoke with Mardella Layman the pharmacist gave pt 6 refills.on meds.Marland KitchenMarland KitchenMarland Kitchen

## 2010-12-25 NOTE — Progress Notes (Signed)
Summary: pt's wife has question re med pls CALL between 1p and 2p  Phone Note Call from Patient   Caller: Spouse [phone] loretta Reason for Call: Talk to Nurse Summary of Call: pt's wife calling re med has a question re change Initial call taken by: Glynda Jaeger,  November 26, 2010 9:53 AM  Follow-up for Phone Call        LVMTCB* Whitney Maeola Sarah RN  November 28, 2010 10:29 AM pt rtn call pls call between 1p and 2p [phone] Ellender Hose RN  November 28, 2010 2:45 PM  LVMTCB** Whitney Maeola Sarah RN  November 28, 2010 2:45 PM  Follow-up by: Whitney Maeola Sarah RN,  November 28, 2010 10:29 AM  Additional Follow-up for Phone Call Additional follow up Details #1::        pt's wife calling back -can now reach at [phone] Glynda Jaeger  November 28, 2010 4:40 PM  Attempted to call patient's wife. Will try again tomorrow. Whitney Maeola Sarah RN  December 01, 2010 3:07 PM     Additional Follow-up for Phone Call Additional follow up Details #2::    I spoke with pt s wife -med was call

## 2010-12-25 NOTE — Progress Notes (Signed)
Summary: refill  pt out medication  Phone Note Refill Request Message from:  Patient on December 15, 2010 4:00 PM  Refills Requested: Medication #1:  FOLIC ACID-VIT B6-VIT B12 0.4-50-0.1 MG  TABS Take 1 tablet by mouth once a day [place]  [phone]  Initial call taken by: Judie Grieve,  December 15, 2010 4:01 PM    Prescriptions: FOLIC ACID-VIT B6-VIT B12 0.4-50-0.1 MG  TABS (FOLIC ACID-VIT B6-VIT B12) Take 1 tablet by mouth once a day  #30 Each x 3   Entered by:   Whitney Maeola Sarah RN   Authorized by:   Verne Carrow, MD   Signed by:   Ellender Hose RN on 12/15/2010   Method used:   Print then Give to Patient   RxID:   [id number]

## 2010-12-31 ENCOUNTER — Telehealth: Payer: Self-pay | Admitting: Cardiovascular Disease

## 2011-01-06 NOTE — Progress Notes (Signed)
Summary: refill  Phone Note Refill Request Call back at [phone] Message from:  wife/loretta on December 31, 2010 8:05 AM  Refills Requested: Medication #1:  CRESTOR 20 MG TABS Take one tablet by mouth daily. Jordan Hawks [phone] pt is out town and he is out medication  Initial call taken by: Judie Grieve,  December 31, 2010 8:07 AM  Follow-up for Phone Call        called in 2 refills at given [facility] ... wife is aware Follow-up by: Hardin Negus, RMA,  December 31, 2010 10:09 AM

## 2011-01-09 ENCOUNTER — Other Ambulatory Visit: Payer: Self-pay | Admitting: *Deleted

## 2011-01-09 DIAGNOSIS — I10 Essential (primary) hypertension: Secondary | ICD-10-CM

## 2011-01-09 DIAGNOSIS — I2510 Atherosclerotic heart disease of native coronary artery without angina pectoris: Secondary | ICD-10-CM

## 2011-01-09 MED ORDER — ISOSORBIDE MONONITRATE ER 30 MG PO TB24: 30.0000 mg | ORAL_TABLET | Freq: Every day | ORAL | Status: DC

## 2011-01-09 MED ORDER — ATENOLOL 50 MG PO TABS: ORAL_TABLET | ORAL | Status: DC

## 2011-01-09 MED ORDER — HYDROCHLOROTHIAZIDE 50 MG PO TABS: ORAL_TABLET | ORAL | Status: DC

## 2011-01-13 ENCOUNTER — Other Ambulatory Visit: Payer: Self-pay | Admitting: *Deleted

## 2011-01-13 DIAGNOSIS — I2510 Atherosclerotic heart disease of native coronary artery without angina pectoris: Secondary | ICD-10-CM

## 2011-01-13 MED ORDER — ISOSORBIDE MONONITRATE ER 30 MG PO TB24: ORAL_TABLET | ORAL | Status: DC

## 2011-01-19 ENCOUNTER — Telehealth: Payer: Self-pay | Admitting: Cardiovascular Disease

## 2011-01-19 NOTE — Telephone Encounter (Signed)
Pt needs altace to be called [facility] # [phone]

## 2011-01-19 NOTE — Telephone Encounter (Signed)
Altace refill called to [facility] at number provided. Left message for pt to call back.

## 2011-01-21 ENCOUNTER — Other Ambulatory Visit: Payer: Self-pay | Admitting: Cardiovascular Disease

## 2011-01-22 NOTE — Telephone Encounter (Signed)
Attempted to call patient but Altace was refilled and sent to his pharmacist.

## 2011-02-10 ENCOUNTER — Other Ambulatory Visit: Payer: Self-pay | Admitting: Cardiology

## 2011-02-10 DIAGNOSIS — E785 Hyperlipidemia, unspecified: Secondary | ICD-10-CM

## 2011-02-10 MED ORDER — ROSUVASTATIN CALCIUM 40 MG PO TABS: 40.0000 mg | ORAL_TABLET | Freq: Every day | ORAL | Status: DC

## 2011-02-10 NOTE — Telephone Encounter (Signed)
In [place], [place] waiting for a refill on Crestor.  Ins. Is wanting it to 40 mg 1/2 daily.  Pt is at phar now and pres was faxed to [place] yesterday.  Number to phar is [phone] speak to [facility].

## 2011-02-11 ENCOUNTER — Telehealth: Payer: Self-pay | Admitting: Cardiovascular Disease

## 2011-02-11 NOTE — Telephone Encounter (Signed)
Pharmacist has question re pt crestor. Pt is out of meds they need to talk to a nurse re how pt needs to take meds. Pharmacist needs a call today.

## 2011-02-12 NOTE — Telephone Encounter (Signed)
Called Pharmacy and left message, gave them [place] phone # so I can take care of it from here.

## 2011-02-19 ENCOUNTER — Other Ambulatory Visit: Payer: Self-pay | Admitting: *Deleted

## 2011-02-19 MED ORDER — PANTOPRAZOLE SODIUM 40 MG PO TBEC: 40.0000 mg | DELAYED_RELEASE_TABLET | Freq: Every day | ORAL | Status: DC

## 2011-02-19 MED ORDER — POTASSIUM CHLORIDE ER 10 MEQ PO TBCR: 10.0000 meq | EXTENDED_RELEASE_TABLET | Freq: Every day | ORAL | Status: DC

## 2011-02-19 MED ORDER — RAMIPRIL 10 MG PO CAPS: 10.0000 mg | ORAL_CAPSULE | Freq: Two times a day (BID) | ORAL | Status: DC

## 2011-03-03 ENCOUNTER — Telehealth: Payer: Self-pay | Admitting: Cardiovascular Disease

## 2011-03-03 NOTE — Assessment & Plan Note (Signed)
[facility]                            CARDIOLOGY OFFICE NOTE   AZARIAS, CHIOU                            MRN:          [id number]  DATE:11/30/2007                            DOB:          05/29/1951    Patient of Dr. Charlies Constable.   [name] is a  60 year old, married, white male patient who has history  of coronary artery disease status post stent placement in the RCA in  [place] in 2002. He was recathed in 2007 and had nonobstructive  disease at that time. He had recurrent chest pain in August 2008 and had  another cath which again showed only mild nonobstructive disease with  40% ostial stenosis in the LAD, no significant obstruction in the  circumflex and 20% within the stent of the proximal to mid RCA and  normal LV function. Dr. Juanda Chance did not feel his symptoms were not  related to myocardial ischemia at that time.   Friday the patient developed what he describes as severe burning chest  pain into his left neck down his left arm.  He took three nitroglycerin  at home without relief as well as a half a bottle of Tums. He then went  to the [facility] emergency room where he was given three more  nitroglycerin which he said felt a little bit stronger than what he had  at home.  The pain eventually eased off. He is not sure whether the  nitro helped or not. I do have some labs that they brought with then  from [facility]. CBC and CMET were all normal except for low potassium of  3.3.  They were told his cardiac markers were normal but I do not have a  copy of those. EKG with sinus bradycardia, otherwise normal. He was told  he had musculoskeletal chest pain and sent home.  The patient said he is  having more frequent chest pain.  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 He says sometimes they last as long as 15 minutes. He  has difficulty discerning between GI and cardiac.  He has been taking  Prilosec.  He denies any indigestion like symptoms or chest burning  related to food that he eats   CURRENT MEDICATIONS:  1. Prilosec 20 mg daily.  2. Aspirin 325 daily.  3. Zetia 10 mg daily.  4. Folbic daily.  5. Hydrochlorothiazide 25 mg daily.  6. Plavix 75 mg daily.  7. K-Dur 10 mEq 2 daily.  8. Ramipril 10 mg daily.  9. Atenolol 50 mg daily.   PHYSICAL EXAM:  A pleasant, 60 year old, white male in no acute  distress.  Blood pressure 139/78, pulse 56, weight 191.  NECK:  Without JVD, HJR, bruit or thyroid enlargement.  LUNGS:  Clear anterior, posterior and lateral.  HEART:  Regular rate and rhythm at 60 beats per minute. Normal S1 and  S2, positive S4. No murmur, rub, bruit, thrill or heave noted.  ABDOMEN:  Soft within organomegaly, masses, lesions  or abnormal  tenderness.  EXTREMITIES:  Without cyanosis or edema. He has good distal pulses.   IMPRESSION:  1. Recurrent chest pain.  Difficult to discern in this patient.  2. Coronary artery disease status post stenting of the RCA in 2002 in      [place] with nonobstructive disease on cath in 2007 and in      August 2008.  3. Status post right carotid endarterectomy by Dr. Hart Rochester in 2003.  4. Hypertension  5. Hyperlipidemia.  6. Family history of coronary artery disease.   PLAN:  It is difficult to discern where this chest pain is coming from.  He has not had a stress test in a long time.  He tells me Dr. Corinda Gubler  told him never to get on a treadmill. The last time he got on, he had to  be switched to an adenosine. I also told him to double up on his  potassium which he was told to do in the emergency room at [facility] but  wanted to wait until he was seen here. Will check carotid Dopplers which  have not been done recently and schedule an adenosine Myoview. He will  then see Dr. Juanda Chance back in follow-up after the stress test.  I have  also given him a prescription for Imdur 30 mg daily to see if  this does  not help his symptoms.      Jacolyn Reedy, PA-C  Electronically Signed      Luis Abed, MD, [facility]  Electronically Signed   ML/MedQ  DD: 11/30/2007  DT: 12/01/2007  Job #: [id number]

## 2011-03-03 NOTE — Assessment & Plan Note (Signed)
[facility]                                 ON-CALL NOTE   ACE, BERGFELD                            MRN:          [id number]  DATE:11/28/2007                            DOB:          09/08/1951    PRIMARY CARDIOLOGIST:  Dr. Charlies Constable.   PRIMARY CARE PHYSICIAN:  Barbette Hair. Artist Pais, DO   BRIEF HISTORY:  [name] is a 60 year old male whose daughter, Cheyenne Adas, called this evening stating that her father's had intermittent  chest discomfort since Friday.   On speaking with Mr. Ing, he states that since Friday, he has been  having intermittent chest discomfort.  It initially was relieved with  sublingual nitroglycerin.  He has had many of these over the weekend. He  apparently told his daughter about his symptoms and she came over this  evening to check him out.  According to his daughter, he has continued  to have chest discomfort intermittently throughout the day.  However,  this evening, it seems to be slightly worse which she initially gave it  an8 on a scale of 0 to 10 at approximately 7 o'clock. Over the next 30  minutes, he had three sublingual nitroglycerins reducing the discomfort  down to 4.  However, he continues to have active chest discomfort.  His  daughter has been monitoring his blood pressure after the nitroglycerin  and states that it is ranged between the 140-168/90s to 100s.  Her  father does not routinely check his blood pressure at home.   Given his ongoing chest discomfort and history of coronary artery  disease with prior stenting as well as his cardiac risk factors of  hypertension, hyperlipidemia and cerebral vascular disease,  I suggested  that his daughter called 9-1-1 and proceed to the nearest emergency room  for further evaluation.  The patient initially wanted to drive to [facility]. I advised against that given his ongoing symptoms and  felt that he should be transported via EMS to the closest emergency  room  if need be.  He could be transferred to [facility] for subsequent treatment.  I explained this both to Mr. Loughney and Ms. Idalia Needle. Ms. Idalia Needle states that  they will summon EMS and proceed to the nearest emergency room.  I  advised to please take all his medication bottles with him.     Joellyn Rued, PA-C  Electronically Signed    EW/MedQ  DD: 11/28/2007  DT: 11/29/2007  Job #: [id number]   cc:   Madolyn Frieze. Jens Som, MD, [facility]

## 2011-03-03 NOTE — Cardiovascular Report (Signed)
NAME:  [name], [name] NO.:  [id number]   MEDICAL RECORD NO.:  [id number]          PATIENT TYPE:  OIB   LOCATION:  1966                         FACILITY:  [facility]   PHYSICIAN:  Everardo Beals. Juanda Chance, MD, FACCDATE OF BIRTH:  11-Mar-1951   DATE OF PROCEDURE:  06/01/2007  DATE OF DISCHARGE:  06/01/2007                            CARDIAC CATHETERIZATION   PAST MEDICAL HISTORY:  Mr. Symmonds is 60 years old and has coronary heart  disease and has had previous stent placed in the right coronary in 2002.  He works in Nature conservation officer business.  Recently he has been having  symptoms of chest discomfort and left arm discomfort and episodes of  diaphoresis mostly at night.  His symptoms have not been exertional.  He  was seen by Dr. Artist Pais in the office and referred to me for consultation  yesterday and we made a decision to bring him today to the [facility] lab for  evaluation with angiography.   PROCEDURE:  The procedure was performed by the right femoral artery,  arterial sheath and 4-French preformed coronary catheters.  An arterial  puncture was performed and Omnipaque contrast was used.  A distal  aortogram was performed to rule out abdominal aortic aneurysm.  The  patient tolerated the procedure well and left the laboratory in  satisfactory condition.   RESULTS:  The aortic pressure was 141/71 with mean of 98.  Left  ventricle pressure.  The left ventricle pressure was 141/80.   Left main coronary artery:  The left main coronary artery was free of  disease.   Left anterior descending artery.  The left anterior descending artery  gave rise to three septal perforators and two diagonal branches.  There  was 40% narrowing at the ostium of the LAD.  There were irregularities  in the proximal mid-LAD but no significant obstruction.   The circumflex artery.  The circumflex artery gave rise to a large  marginal branch, atrial branch and a posterolateral branch.  These  vessels were free of  significant disease.   The right coronary artery.  The right coronary artery was a moderate-  sized vessel, gave rise to two right ventricular branches, both  descending branch and two small and one larger posterolateral branch.  There was 20% diffuse narrowing within the stent in the proximal right  coronary.  There was an aneurysmal dilatation of the vessel in the mid  to distal portion.   The left ventriculogram.  The left ventriculogram was performed in the  RAO projection, shows good wall motion with no areas of hypokinesis.  The estimated ejection fraction was 60%.   Distal aortogram.  A distal aortogram was performed which showed patent  renal arteries.  There were irregularities in the distal aorta but no  significant obstruction.   CONCLUSION:  Nonobstructive coronary artery disease with 40% ostial  stenosis in the left anterior descending artery, no significant  obstruction in the circumflex artery, 20% narrowing within the stent in  the proximal to mid right coronary and normal LV function.   RECOMMENDATIONS:  The patient has only  mild nonobstructive disease and I  do not think his symptoms are related to myocardial ischemia.  I am not  sure regarding the etiology of the symptoms and will consider a CT  angiogram to rule out pulmonary embolus and other causes.  I will  reevaluate the possibility that some of the symptoms could be related to  reflux.      Bruce Elvera Lennox Juanda Chance, MD, [facility]  Electronically Signed     BRB/MEDQ  D:  06/01/2007  T:  06/02/2007  Job:  [id number]   cc:   Barbette Hair. Artist Pais, DO

## 2011-03-03 NOTE — Assessment & Plan Note (Signed)
[facility]ALTHCARE                            CARDIOLOGY OFFICE NOTE   PANFILO, KETCHUM                            MRN:          [id number]  DATE:05/31/2007                            DOB:          06/21/1951    REASON FOR REFERRAL:  Evaluation of chest pain and shortness of breath.   CLINICAL HISTORY:  [name] is 60 years old, and has documented coronary  artery disease with placement of a stent in the right coronary artery in  [place], [place] in 2002.  He works as a Hydrographic surveyor, and had a syncopal episode prior to that admission with  catheterization and intervention.  He was catheterized in April 2007 for  chest pain, and had nonobstructive disease at that time.   Recently, he has had symptoms of chest discomfort and left arm  discomfort over the last several days.  He called and made an  appointment to see Dr. Artist Pais for these symptoms, and Dr. Artist Pais was concerned  about possible recurrent ischemia, and referred him here for further  evaluation.  He says he has not had any exertional related symptoms.  He  has had symptoms of shortness of breath and fatigue, and finds that he  gives out very easily.  He has also had sweats at night.   PAST MEDICAL HISTORY:  Significant for:  1. Gastrointestinal reflux.  2. Hypertension.  3. Hyperlipidemia.  4. Right carotid endarterectomy by Dr. Hart Rochester in 2003.   CURRENT MEDICATIONS:  Includes:  1. Aspirin 325 mg daily.  2. Zetia 10 mg daily.  3. Hydrochlorothiazide 25 mg daily.  4. Plavix 75 mg daily.  5. Lipitor 80 mg nightly.  6. K-Dur 10 mEq 2 tablets daily.  7. Ramipril 10 mg 1 tablet daily.  8. Atenolol 50 mg 1 tablet daily.   SOCIAL HISTORY:  He works as a Games developer.  His wife was  with him today.  He smoked until 2002.   FAMILY HISTORY:  His father had a previous myocardial infarction.  His  mother had a carotid endarterectomy.  He has 2 brothers with   hypertension.   REVIEW OF SYSTEMS:  Positive for symptoms of fatigue.   EXAMINATION:  Today, blood pressure is 146/82, and pulse 54 and regular.  There was no venous distention.  There was a right carotid  endarterectomy scar.  There were no bruits.  CHEST:  Clear without rales or rhonchi.  CARDIAC:  Rhythm was regular.  I could hear no murmurs or gallops.  ABDOMEN:  Soft with normal bowel sounds.  There was no  hepatosplenomegaly, and no pulsatile masses.  The peripheral pulses were full.  There was no peripheral edema.  MUSCULOSKELETAL:  Showed no deformities.  SKIN:  Warm and dry.  NEUROLOGIC:  Examination showed no focal neurological signs.   Electrocardiogram showed sinus bradycardia, and was otherwise normal.   IMPRESSION:  1. Chest and arm pain, and episodes of diaphoresis and shortness of      breath somewhat suggestive for ischemia.  2. Coronary artery disease.  Status post  prior stenting of the right      coronary artery in 2002 with nonobstructive disease at      catheterization in 2007.  3. Status post right carotid endarterectomy by Dr. Hart Rochester in 2003.  4. Hypertension.  5. Hyperlipidemia.  6. Family history of coronary heart disease.   RECOMMENDATIONS:  Mr. 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  I think the best way to evaluate him  would be with repeat angiography.  We will also get laboratory studies  to evaluate symptoms of fatigue, including a TSH.  We will set him up  for catheterization tomorrow in the [facility] lab.     Bruce Elvera Lennox Juanda Chance, MD, [facility]  Electronically Signed    BRB/MedQ  DD: 05/31/2007  DT: 05/31/2007  Job #: [id number]   cc:   Barbette Hair. Artist Pais, DO

## 2011-03-03 NOTE — Telephone Encounter (Signed)
Mr. Shaneyfelt wanted to schedule his carotid ultrasound and fasting blood work. Scheduled for 03/20/11 @ 9:30 am.

## 2011-03-03 NOTE — Telephone Encounter (Signed)
Pt thought he was to have some tests or appt next time in town, the recall shows a 6 mos appt not due till august, pt will be in town 5-24 for a week and a half does he need to have anything done now?

## 2011-03-03 NOTE — Procedures (Signed)
NAME:  ESKER, DEVER NO.:  [id number]   MEDICAL RECORD NO.:  [id number]          PATIENT TYPE:  OUT   LOCATION:  SLEEP CENTER                 FACILITY:  [facility]   PHYSICIAN:  Barbaraann Share, MD,FCCPDATE OF BIRTH:  [birth year]   DATE OF STUDY:  02/04/2008                            NOCTURNAL POLYSOMNOGRAM   REFERRING PHYSICIAN:  Barbaraann Share, MD,FCCP   INDICATION FOR STUDY:  Hypersomnia with sleep apnea.   EPWORTH SLEEPINESS SCORE:  13.   MEDICATIONS:   SLEEP ARCHITECTURE:  The patient had total sleep time of 402 minutes  with minimal slow wave sleep as well as REM.  Sleep onset latency was  normal, as was REM onset.  Sleep efficiency was fairly good at 90%.   RESPIRATORY DATA:  The patient was found to have 6 obstructive apnea's  and no hypopnea's for an apnea/hypopnea index of 1 event per hour.  He  was also noted to have 28 __________ which gave him a respiratory  disturbance index of 5 events per hour.  The events were clearly worse  during REM and there was loud snoring noted throughout.   OXYGEN DATA:  There was oxygen desaturation as low as 86% that was  transient in nature.   CARDIAC DATA:  No clinically significant arrhythmia's were noted.   MOVEMENT-PARASOMNIA:  The patient was found to have no periodic leg  movements or abnormal behaviors.   IMPRESSIONS-RECOMMENDATIONS:  1. Small numbers of obstructive events which do not meet the      apnea/hypopnea index criteria for the obstructive sleep apnea      syndrome.  2. The patient did have RERAS noted which gave him a respiratory      disturbance index that could be consistent with very mild      obstructive sleep apnea.  The events were clearly worse during REM.      There was oxygen      desaturation as low as 86%.  If the patient is significantly      symptomatic, thought should be given to possible treatment for      this.  Clinical correlation is suggested.      Barbaraann Share,  MD,FCCP  Diplomate, American Board of Sleep  Medicine  Electronically Signed     KMC/MEDQ  D:  02/21/2008 15:46:10  T:  02/21/2008 16:20:38  Job:  [id number]

## 2011-03-03 NOTE — Assessment & Plan Note (Signed)
[facility] HEALTHCARE                            CARDIOLOGY OFFICE NOTE   [name], [name]                            MRN:          [id number]  DATE:01/13/2008                            DOB:          17-Apr-1951    PRIMARY CARE PHYSICIAN:  Dr. Illene Regulus.   PAST MEDICAL HISTORY:  [name] is 60 years old and returns for followup  management of coronary heart disease.  He has had a stent placed in the  right coronary artery in 2002 in [place] and was cath'd here in 2007  and August 2008 which time he had nonobstructive disease.  Because of  recurrent symptoms of chest pain and palpitations, he was seen here by  Herma Carson PA-C on February 11.  She arranged for him to have a  stress Myoview scan and she also did laboratory workup.  The stress  Myoview scan was negative for ischemia with a good ejection fraction.  His potassium was low and this was corrected with potassium supplement  and repeat was normal.   He was started on isosorbide and chest pain has pretty much resolved.  However, he does have symptoms of fatigue and his family questioned  whether he might have sleep apnea since he snores a lot.   His past medical history significant for a previous carotid record by  Dr. Hart Rochester in 2003 in the right.  Also history of hypertension,  hyperlipidemia and positive family history for coronary heart disease.   CURRENT MEDICATION:  Include Prilosec, aspirin, Zetia, Foltx,  hydrochlorothiazide, Plavix and ramipril 10 mg daily, atenolol 50 mg  daily, potassium 10 mEq 4 tablets daily, isosorbide 30 mg daily, and  Tylenol.   REVIEW OF SYSTEMS:  Positive for his heart rate getting slow when he has  episodes of chest pain down the 40s.   EXAMINATION:  His blood pressure 150/86 and pulse 65 and regular.  There was no venous distension.  The carotid pulses were full without  bruits.  CHEST:  Was clear.  HEART:  Rhythm was regular.  No murmurs or gallops.  ABDOMEN:  Soft with normal bowel sounds.  No hepatosplenomegaly.  Peripherals were full and no peripheral edema.   IMPRESSION:  1. Coronary artery status post prior stenting of the right coronary in      2002 with nonobstructive coronary disease at cath in August 2008      and recent negative Myoview scan, now stable.  2. Hypertension.  3. Hyperlipidemia.  4. Status post right carotid endarterectomy by Dr. Hart Rochester 2003.  5. Symptoms of fatigue and snoring, rule out obstructive sleep apnea.  6. Bradycardia on atenolol.   RECOMMENDATIONS:  I think [name] is doing well from standpoint of  coronary heart disease.  Does have symptoms of fatigue and snoring and  sleep apnea and will arrange for him to see one of our pulmonary  physicians.  His heart rate is slow so will cut back his atenolol from  50 to 25 a day.  However, his blood pressure is to high before cutting  his  atenolol back, so will add amlodipine 5 a day.  I will plan to see  him back in the year.     Bruce Elvera Lennox Juanda Chance, MD, [facility]  Electronically Signed    BRB/MedQ  DD: 01/13/2008  DT: 01/14/2008  Job #: [id number]

## 2011-03-03 NOTE — Letter (Signed)
December 20, 2008     RE:  GYAN, CAMBRE  MRN:  [id number]  /  DOB:  03-12-1951   To whom it may concern:   [name] is a patient of ours and has a cardiac condition.  We  request permission for him to purchase a crossbow to deer hunt since his  medical condition makes it difficult for him to use a standard bow.  I  would be happy to answer further questions regarding his medical  condition.    Sincerely,      Bruce R. Juanda Chance, MD, [facility]  Electronically Signed    BRB/MedQ  DD: 12/20/2008  DT: 12/21/2008  Job #: [id number]

## 2011-03-03 NOTE — Assessment & Plan Note (Signed)
[facility] HEALTHCARE                            CARDIOLOGY OFFICE NOTE   [name], [name]                            MRN:          [id number]  DATE:12/10/2008                            DOB:          Nov 23, 1950    PRIMARY CARE PHYSICIAN:  Brandon Gess. Norins, MD   CLINICAL HISTORY:  [name] is a 60 year old and returned for management  of his coronary heart disease.  He had a bare-metal stent placed in the  right coronary artery in 2002 in [place] and had a cath in 2008,  which showed nonobstructive disease and in February of last year, he had  a Myoview scan after he was seen by [name] for chest pain and  this scan did not show any evidence of ischemia.   He has done well since that time, but over the last month he said, has  had some heartburn mostly at night when he lays down.  This occurs about  2 times a week.  On 2 occasions, he took nitroglycerin and seem to get  some relief.  Most times, he just wait and this goes away.  He has had  no exertionally related chest pain, chest tightness, or heartburn.   PAST MEDICAL HISTORY:  Significant for previous carotid enterectomy by  [name] in 2003.  He also has hypertension and hyperlipidemia.   FAMILY HISTORY:  Positive for coronary heart disease.   CURRENT MEDICATIONS:  1. Potassium 10 mEq 3 daily.  2. Amlodipine 5 mg daily.  3. Hydrochlorothiazide 50 mg one-half tablet daily.  4. Ramipril 10 mg b.i.d.  5. Plavix 75 mg daily.  6. Aspirin 325 mg daily.  7. Zetia 10 mg daily.  8. Prilosec 20 mg daily.  9. He was not able to tolerate LIPITOR.   PHYSICAL EXAMINATION:  VITAL SIGNS:  The blood pressure was 116/70 and  the pulse was 57 and regular.  NECK:  There was no venous distention.  The carotid pulses were full  without bruits.  There was a right carotid enterectomy scar.  CHEST:  Clear.  CARDIAC:  Rhythm was regular.  I could hear no murmurs or gallops.  ABDOMEN:  Soft with normal bowel  sounds.  There is hepatosplenomegaly.  EXTREMITIES:  The peripheral pulses were full with no peripheral edema.   An electrocardiogram was normal.   IMPRESSION:  1. Coronary artery disease, status post stenting of the right coronary      artery in 2002 in [place] with nonobstructive disease at cath in      2008 and a negative Myoview scan in 2009.  2. Nocturnal heartburn, probably related to reflux.  3. Hypertension.  4. Hyperlipidemia.  5. Status post right carotid endarterectomy by [name] in 2003.  6. Obstructive sleep apnea.  7. Bradycardia on atenolol.  8. Intolerance to LIPITOR.   RECOMMENDATIONS:  [name], I think is doing well from standpoint of his  heart.  I think, his nocturnal heartburn is not likely cardiac, although  he did take nitroglycerin with relief on 2 occasions.  He has  had no  exertional related symptoms.  We will plan to increase his Prilosec to  20 b.i.d. and arranged for a GI consult.  I debated with him about an ex-  stress test, but will hold off on this until we see if he responds to  the increased Prilosec as what GI evaluation shows.  I told him if his  symptoms persist and we do not get an answer from a GI standpoint that  we went to evaluate this further possibly with a stress test.  Otherwise, we will plan to get blood work today including lipid, liver,  CBC and BNP, and then we will see him back in a year.  We might consider  trying another statin on him, but he was intolerant to LIPITOR.     Bruce Elvera Lennox Juanda Chance, MD, [facility]  Electronically Signed    BRB/MedQ  DD: 12/10/2008  DT: 12/11/2008  Job #: [id number]

## 2011-03-06 NOTE — H&P (Signed)
NAME:  [name], [name] NO.:  [id number]   MEDICAL RECORD NO.:  [id number]           PATIENT TYPE:   LOCATION:                                 FACILITY:   PHYSICIAN:  Cecil Cranker, M.D.     DATE OF BIRTH:   DATE OF ADMISSION:  02/02/2006  DATE OF DISCHARGE:                                HISTORY & PHYSICAL   CHIEF COMPLAINT:  Chest pain.   HISTORY OF PRESENT ILLNESS:  This is a 60 year old white male patient with  history of coronary artery disease status post stenting of his RCA in  [place], [place], on Mar 08, 2001.  He had a subsequent false  positive adenosine Cardiolite suggesting mild anterior apical ischemia that  lead to a cardiac catheterization by Dr. Eden Emms in April 2003.  This  revealed  30% discrete LAD, first and second diagonals had 20 and 30%  discrete lesions.  Circumflex had a 30% aneurysmal type lesion in the first  OM, RCA with 20 to 30% multiple discrete lesions proximally.  The proximal  stent was widely patent.  Distal vessel had an aneurysmal segment.  Just  prior to this, there was a 40 to 50% lesions.  Distal PLA and PDA were  normal.  LV function was 60%.   Approximately a month ago, the patient was admitted to [facility] with  recurrent chest pain.  He ruled out for an MI and was started on Plavix and  felodipine.  He cannot tolerate the felodipine as it caused hand and leg  swelling, but maintained on his Plavix.  He did have a stress test the  following day which we are trying to obtain the results of.  Today he says  he continues to have recurrent chest pain, described as a burning on his  left side without radiation, but he does get short of breath and awakened  him three times last night and lasts 15 to 20 minutes.  He did become short  of breath with episodes last night.  When he awakened this morning he had  chest pain, he was still having it while in the office this morning.  It  eased after two  nitroglycerin. He has had some relief at home with Tums,  although it is not immediate relief.  The pain usually lasts 15 to 20  minutes after he takes a Tums.  He works as a Merchandiser, retail, does a lot of  walking, but no heavy lifting or strenuous activity.  All his episodes occur  at night or in the morning when he wakes up.  He says if he was rushing, he  could not make it as he would get out of breath.   CURRENT MEDICATIONS:  1.  Altace 10 mg b.i.d.  2.  Lipitor 40 mg daily.  3.  Foltx 1 daily.  4.  Atenolol 50 daily.  5.  Hydrochlorothiazide 25 mg daily.  6.  Protonix 40 daily.  7.  Enteric coated aspirin 325 daily.  8.  Zetia 10 mg daily.  9.  K-Dur  20 mEq daily.  10. Plavix 75 mg daily.  11. Lipitor 80 mg daily.  12. Prilosec 20 mg daily.   ALLERGIES:  NO KNOWN DRUG ALLERGIES.  Intolerance to FELODIPINE as it causes  swelling.   PAST MEDICAL HISTORY:  1.  Hypertension.  2.  Dyslipidemia.  3.  Status post left knee surgery.  4.  Prior moles removed.   SOCIAL HISTORY:  He works as a Games developer.  He has lived with  the same partner for the past 11 years.  He has a 73 year old daughter who  is with him today.  He quit smoking back in 2002.  He had a right carotid  endarterectomy by Dr. Hart Rochester in 2003.   FAMILY HISTORY:  His father had an MI.  Mother had a carotid endarterectomy.  Two brothers with hypertension.   REVIEW OF SYSTEMS:  Negative for dizziness or presyncopal signs or symptoms,  dyspepsia, dysphagia, nausea or vomiting, change in bowels or melena.  CARDIOPULMONARY:  Please see HPI.   PHYSICAL EXAMINATION:  GENERAL APPEARANCE:  This is a pleasant 60 year old  white male complaining of chest pain.  VITAL SIGNS:  Blood pressure 128/82, pulse 48, weight 193.  HEENT:  Head is normocephalic without sign of trauma.  Extraocular movements  are intact.  Pupils are equal, round, reactive to light and accommodation.  Nasal mucosa is moist.  Throat is without  erythema or exudate.  NECK:  Without JVD, HJR, bruit or thyroid enlargement.  There is a right  carotid endarterectomy scar.  LUNGS:  Decreased breath sounds but they are clear anterior, posterior and  lateral.  CARDIOVASCULAR:  Regular rate and rhythm at 48 beats per minute.  Normal S1  and S2.  No significant murmur, rub, bruit, thrill or heave noted.  ABDOMEN:  Soft without organomegaly, masses, lesions or abnormal tenderness.  EXTREMITIES:  Without clubbing, cyanosis, or edema.  Distal pulses.  NEUROLOGIC:  Without focal deficit.   IMPRESSION:  1.  Unstable angina, rule out myocardial infarction.  2.  Coronary artery disease, status post stenting of the right coronary      artery in [place], [place], in 2002.  Follow-up      catheterization after false positive Cardiolite in 2003, revealed patent      stent.  3.  Status post right carotid endarterectomy by Dr. Hart Rochester in 2003.  4.  Hypertension.  5.  Hyperlipidemia.  6.  Family history of coronary artery disease.  7.  Prior smoker.   PLAN:  We will admit this patient to [facility] and he  will undergo cardiac catheterization today to rule out significant  progression of coronary artery disease.      Jacolyn Reedy, P.A. [facility]    ______________________________  E. Graceann Congress, M.D.    ML/MEDQ  D:  02/02/2006  T:  02/02/2006  Job:  [id number]

## 2011-03-06 NOTE — Discharge Summary (Signed)
[name], [name] NO.:  [id number]   MEDICAL RECORD NO.:  [id number]          PATIENT TYPE:  INP   LOCATION:  3707                         FACILITY:  [facility]   PHYSICIAN:  Charlton Haws, M.D.     DATE OF BIRTH:  [birth year]   DATE OF ADMISSION:  02/02/2006  DATE OF DISCHARGE:  02/02/2006                                 DISCHARGE SUMMARY   REASON FOR ADMISSION:  Unstable angina pectoris.   DISCHARGE DIAGNOSES:  1.  Non-cardiac chest pain.  2.  Coronary artery disease.      1.  Status post stenting to the right coronary artery in [facility] May          of 2002.      2.  False positive Cardiolite April 2003 - non-obstructive disease at          catheterization.  3.  Low normal left ventricular function with an ejection fraction of 50%.  4.  Hypertension.  5.  Treated dyslipidemia.  6.  Cerebrovascular disease.      1.  Status post right carotid endarterectomy 2003.  7.  Past history of smoking.  8.  Family history of coronary disease.  9.  Bradycardia.   PROCEDURE:  Cardiac catheterization by Dr. Charlton Haws.  Please see the  dictated note for complete details.  Results:  Left main distal 40%; LAD 20%  proximal, 20% mid, 30% distal; first diagonal 30%; circumflex 30-40%, first  obtuse marginal small aneurysm segment; RCA 20% proximal stent widely  patent, 30% mid distal; EF 50%; mild inferior hypokinesis.   BRIEF HISTORY:  [name] is a 60 year old male patient with a history of  coronary disease who presented to the office today with complaints of chest  discomfort.  He had been admitted to [facility] recently with unstable  angina, but left against medical advice.  He apparently ruled out for  myocardial infarction at that time.  He apparently was to have a stress  test.  Those results were unavailable.  The patient was admitted for cardiac  catheterization.   HOSPITAL COURSE:  The patient went for cardiac catheterization with Dr.  Eden Emms as  noted above.  He had non-obstructive disease with a patent stent  in the RCA.  His EF was 50% with inferior hypokinesis.  The patient had some  minor bleeding post catheterization.  At the time of this dictation the  patient will be discharged to home later this evening if his groin remains  stable.  He will need follow-up with his primary care physician, Dr.  Corinda Gubler.   LABORATORIES:  White count 5500, hemoglobin 13, hematocrit 37.8, platelet  count 176,000.  INR 1.  Sodium 142, potassium 4.4, glucose 110, BUN 13,  creatinine 1.1.  LFTs okay.  CK-MB 0.8, troponin I 0.01.  Lipid panel  pending at the time of the dictation.   DISCHARGE MEDICATIONS:  1.  The patient has a listing of Prilosec and Protonix in his medicines.  He      has been asked to increase his Prilosec to 40  mg b.i.d. or to increase      his Protonix to 40 mg b.i.d.  The rest of his medications will be the      same.  2.  Altace 10 mg daily.  3.  Lipitor 40 mg q.h.s.  4.  Foltx daily.  5.  Atenolol 50 mg a day.  6.  HCTZ 25 mg a day.  7.  Coated aspirin 325 mg daily.  8.  Zetia 10 mg a day.  9.  K-Dur 20 mEq a day.  10. Plavix 75 mg a day.  11. Nitroglycerin p.r.n. chest pain.   FOLLOW-UP:  The patient will be following up with his primary care  physician, Dr. Theophilus Bones, in the next one to two weeks.  He will be set up  with a follow-up appointment with Dr. Corinda Gubler in the next three to four  weeks.   ACTIVITY:  He is asked to do no driving, heavy lifting for three days.  He  is to increase his activity slowly.  He is to maintain a low fat, low sodium  diet.   WOUND CARE:  He is to call office for any groin swelling, bleeding,  bruising, or fever.      Tereso Newcomer, P.A.    ______________________________  Charlton Haws, M.D.    SW/MEDQ  D:  02/02/2006  T:  02/03/2006  Job:  [id number]   cc:   Venetia Maxon  Fax: [phone]

## 2011-03-06 NOTE — Op Note (Signed)
NAME:  [name], [name]                             ACCOUNT NO.:  [id number]   MEDICAL RECORD NO.:  [id number]                   PATIENT TYPE:  INP   LOCATION:  3313                                 FACILITY:  [facility]   PHYSICIAN:  Quita Skye. Hart Rochester, M.D.               DATE OF BIRTH:  [birth year]   DATE OF PROCEDURE:  DATE OF DISCHARGE:                                 OPERATIVE REPORT   PREOPERATIVE DIAGNOSIS:  Severe right internal carotid stenosis,  asymptomatic.   POSTOPERATIVE DIAGNOSIS:  Severe right internal carotid stenosis,  asymptomatic.   OPERATION:  Right carotid endarterectomy with Dacron patch angioplasty.   SURGEON:  Vibhav Waddill. Hart Rochester, M.D.   FIRST ASSISTANT:  Toribio Harbour, R.N.   ANESTHESIA:  General endotracheal anesthesia.   BRIEF HISTORY:  This patient is a previous two pack a day smoker, has  previously undergone PTCA for coronary artery disease and had a known,  moderate light internal carotid stenosis. Repeat duplex scan now  reveals that this has progressed to 90% in severity, and he is scheduled for  a right carotid enterectomy for this asymptomatic lesion.   DESCRIPTION OF PROCEDURE:  The patient was taken to the operating room and  placed in the supine position, at which time satisfactory general  endotracheal anesthesia was administered. The right neck was prepped with  Betadine scrub and solution and draped in the  routine sterile manner.   An incision was made along the anterior border  of the  sternocleidomastoid  muscle and carried down through the subcutaneous tissue and platysma using  the Bovie. The common facial vein and external jugular veins were ligated  with 3-0 silk ties and divided, exposing the common, internal and external  carotid arteries. Care was taken not to injure the vagus or hypoglossal  nerves, both of which were exposed. There was a calcified atherosclerotic  plaque at the bifurcation extending up the internal carotid artery  about 3  to 4 cm posteriorly. The distal vessel appeared normal. A #10 shunt was  prepared and the patient was  heparinized.   The carotid vessels were occluded with vascular clamps. A longitudinal  opening was made in the common carotid with a #15 blade and extended up the  internal carotid with the Potts scissors to a point distal to the disease. A  #10 shunt was inserted without difficulty, reestablishing flow in about two  minutes. The plaque was about 90% stenotic in severity, being very focal and  also soft with ulcerations posteriorly at  the bifurcation and the posterior  plaque extended up  to the medial wall about 3 cm and terminated.   A standard endarterectomy was then performed using the elevator and a Potts  scissors with an eversion endarterectomy of the  external carotid. The  plaque feathered off this internal carotid artery nicely, not requiring any  tacking sutures.  The lumen was thoroughly irrigated with heparin saline.  All loose debris was carefully removed and all dry vein was closed with a  patch using continuous 6-0 Prolene.   Prior to  completion of the  closure the shunt was removed after about 30  minutes of shunt time, following antegrade and retrograde flushing the  closure was completed. The reestablishment of flow was initially up the  external and up the internal branch.  The carotid was occluded for less than  two minutes for removal of the  shunt.  Protamine was then given to reverse  the heparin.   Following adequate hemostasis the wound was irrigated with saline, closed in  layers with Vicryl in a subcuticular fashion. A sterile dressing was  applied. The patient was then to the recovery room in satisfactory  condition.                                               Quita Skye Hart Rochester, M.D.    JDL/MEDQ  D:  06/26/2002  T:  06/27/2002  Job:  [id number]   cc:   Cecil Cranker, M.D. [facility]

## 2011-03-10 NOTE — Assessment & Plan Note (Signed)
[facility]                            CARDIOLOGY OFFICE NOTE   MODESTO, GANOE                            MRN:          [id number]  DATE:11/02/2006                            DOB:          1951/02/24    REFERRING PHYSICIAN:  Cecil Cranker, MD, [facility]   PRIMARY PHYSICIAN:  Cecil Cranker, M.D.   SUBJECTIVE:  This is a pleasant 60 year old married white male, a  patient with a history of chronic coronary artery disease, status post  stent placement of the RCA in [place] in 2002.  He had a follow-up  catheterization.  His last follow-up catheterization in April 2007, for  chest pain, showed no critical re-stenosis of his stent or residual  disease.  The ejection fraction was 50%.  Medical therapy was  recommended.   The patient recently had stopped his Prilosec, thinking he did not need  it any more, and he started developing burning across his chest, usually  after he ate.  He does utilize spicy foods and says this may have  contributed.  Once he restarted his Prilosec, the chest pain abated.  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  He does not exercise outside of work.  He  quit smoking when he had his intervention back in 2002.  He denies any  exertional chest pain, dizziness, palpitations, dyspnea or pre-syncope.   CURRENT MEDICATIONS:  1. Aspirin 325 mg daily.  2. Altace 10 mg b.i.d.  3. Hydrochlorothiazide 25 mg daily.  4. Atenolol 50 mg daily.  5. Potassium 10 mEq, two daily.  6. Zetia 10 mg daily.  7. Foltx daily.  8. Plavix 75 mg daily.  9. Lipitor 80 mg daily.  10.Prilosec 20 mg daily.   PHYSICAL EXAMINATION:  GENERAL:  This is a pleasant 60 year old white  male, in no acute distress.  VITAL SIGNS:  Blood pressure 100/68, pulse 49.  NECK:  Without jugular venous distention, HJR, bruit or thyroid  enlargement.  LUNGS:  Clear anterior, posterior and  lateral.  HEART:  A regular rate and rhythm at 50 betas per minute.  Normal S1 and  S2.  No murmur, rub, bruit, thrill or heave noted.  ABDOMEN:  Soft, without organomegaly, masses, lesions or abnormal  tenderness.  EXTREMITIES:  Without clubbing, cyanosis or edema.  Good distal pulses.   Electrocardiogram:  Sinus bradycardia at 49 beats per minute.  No acute  change.   IMPRESSION:  1. Coronary artery disease, status post stent to the right coronary      artery in [place] in 2002.  Last cardiac catheterization in      April 2007, no re-stenosis, with a distal 40% eccentric left main      and scattered 20%-30% lesions elsewhere.  Ejection fraction was      50%.  2. Hyperlipidemia.  3. Hypertension, controlled.  4. Sinus bradycardia, on atenolol, tolerating.  5. Gastroesophageal reflux disease, improved with Prilosec.   PLAN:  The patient is stable from a cardiac standpoint.  I have asked  him to get a fasting lipid panel, as this has not been checked in over  one year.  If his blood pressure and heart rate continue to stay low, we  may be able to stop his hydrochlorothiazide and potassium.  We will set  him up to establish with Dr. Bevelyn Buckles. Bens[place]hon in May, in light of Dr.  Celso Sickle [place]'s retirement.      Jacolyn Reedy, PA-C  Electronically Signed      Salvadore Farber, MD  Electronically Signed   ML/MedQ  DD: 11/02/2006  DT: 11/02/2006  Job #: [id number]

## 2011-03-10 NOTE — Cardiovascular Report (Signed)
[name], [name] NO.:  [id number]   MEDICAL RECORD NO.:  [id number]          PATIENT TYPE:  INP   LOCATION:  1827                         FACILITY:  [facility]   PHYSICIAN:  Charlton Haws, M.D.     DATE OF BIRTH:  21-Sep-1951   DATE OF PROCEDURE:  02/02/2006  DATE OF DISCHARGE:                              CARDIAC CATHETERIZATION   Coronary angiography.   INDICATIONS:  Recurrent chest pain, history of distant stent in [place],  I believe, in 2001.   Cine catheterization was done from the right femoral artery using 6-French  catheters.  At the end of the case AngioSeal was used with moderate  hemostasis.  There was mild oozing.   Left main coronary artery has a distal eccentric 40% stenosis.   Left anterior descending artery had 20-30% multi discrete lesions in the  proximal, mid, and distal portion.  The ostium of the first diagonal had 20%  discrete lesion.  Second diagonal branch was normal.   Circumflex coronary artery was medium sized.  There was a very large first  obtuse marginal branch.  Proximal portion of the obtuse marginal branch has  an aneurysm segment with a 30% discrete stenosis.   Right coronary artery was somewhat small.  The stent in the proximal vessel  was widely patent with only 20% residual disease.  Mid and distal vessel had  20-30% multi discrete lesions.  PDA and PLA were normal.   RAO VENTRICULOGRAPHY:  RAO ventriculography showed mild inferior wall  hypokinesis.  The EF was 50%.  There was no gradient across the aortic valve  and no MR.  Aortic pressure was 150/80.  LV pressure was 150/18 with a post  A-wave, EDP of 24.   IMPRESSION:  Patient does not have critical restenosis of his stent or  residual coronary artery disease.  He will be treated medically.  Apparently, he does not have a doctor in [facility] anymore as Dr. Theophilus Bones  is retired.  He will be discharged later tonight if his groin is stable.  We  will see if he can  follow up with Dr. Glennon Hamilton in our office in a month  for risk factor modification and possibly to align him with a [place]  primary care M.D.           ______________________________  Charlton Haws, M.D.     PN/MEDQ  D:  02/02/2006  T:  02/03/2006  Job:  [id number]

## 2011-03-10 NOTE — Cardiovascular Report (Signed)
[facility]  Patient:    [name], [name] Visit Number: [id number] MRN: [id number]          Service Type: CAT Location: [facility] 2899 10 Attending Physician:  Lenoria Farrier Dictated by:   Noralyn Pick Eden Emms, M.D. [facility] Proc. Date: 01/19/02 Admit Date:  01/19/2002 Discharge Date: 01/19/2002   CC:         Cecil Cranker, M.D. [facility]   Cardiac Catheterization  PROCEDURE PERFORMED: Coronary arteriography.  INDICATIONS: Recurrent chest pain, status post PTCA and stenting of the right coronary artery in 2001 in [place].  CORONARY ARTERIOGRAPHY: Left main coronary artery had a 30% discrete distal stenosis.  The ostium of the LAD had a 30% discrete stenosis.  The distal LAD was small and diffusely diseased, but no discrete stenosis. THe first and second diagonal branches had 20-30% multiple discrete lesions.  The circumflex coronary artery had a 30% aneurysmal type lesion in the first obtuse marginal branch.  The right coronary artery had 20-30% multiple discrete lesions proximally. The proximal stent was widely patent. The distal vessel had an aneurysmal segment. Just prior to this there was a 40-50% lesion. Distal PLA and PDA were normal.  RIGHT ANTERIOR OBLIQUE VENTRICULOGRAPHY: RAO ventriculography was normal. Ejection fraction was in the 60% range. There was no gradient across the aortic valve and no MR.  IMPRESSION: The patients Cardiolite study suggesting mild anterior apical wall ischemia, would appear to be a false-positive, although the distal LAD is diffusely diseased. There were no critical lesions in it and there is totally normal flow in this previous stent to the right coronary artery is widely patent. The patient will continue medical therapy. He does need constant followup. However, as he is prone to calcification and hardening of the arteries, he also has a somewhat critical right carotid artery stenosis that will be followed  up with a duplex scan in three months.  He will be discharged later today as long as his groin heals well. Dictated by:   Noralyn Pick Eden Emms, M.D. [facility] Attending Physician:  Lenoria Farrier DD:  01/19/02 TD:  01/19/02 Job: [id number]

## 2011-03-19 ENCOUNTER — Other Ambulatory Visit: Payer: Self-pay | Admitting: Cardiology

## 2011-03-19 DIAGNOSIS — I6529 Occlusion and stenosis of unspecified carotid artery: Secondary | ICD-10-CM

## 2011-03-20 ENCOUNTER — Other Ambulatory Visit (INDEPENDENT_AMBULATORY_CARE_PROVIDER_SITE_OTHER): Payer: 59 | Admitting: *Deleted

## 2011-03-20 ENCOUNTER — Encounter: Payer: BC Managed Care – PPO | Admitting: Cardiology

## 2011-03-20 ENCOUNTER — Encounter (INDEPENDENT_AMBULATORY_CARE_PROVIDER_SITE_OTHER): Payer: 59 | Admitting: Cardiology

## 2011-03-20 DIAGNOSIS — I6529 Occlusion and stenosis of unspecified carotid artery: Secondary | ICD-10-CM

## 2011-03-20 DIAGNOSIS — E785 Hyperlipidemia, unspecified: Secondary | ICD-10-CM

## 2011-03-20 LAB — HEPATIC FUNCTION PANEL
ALT: 43 U/L (ref 0–53)
AST: 26 U/L (ref 0–37)
Albumin: 4 g/dL (ref 3.5–5.2)
Alkaline Phosphatase: 47 U/L (ref 39–117)
Bilirubin, Direct: 0.2 mg/dL (ref 0.0–0.3)
Total Bilirubin: 1.3 mg/dL — ABNORMAL HIGH (ref 0.3–1.2)
Total Protein: 6.7 g/dL (ref 6.0–8.3)

## 2011-03-20 LAB — LIPID PANEL
Cholesterol: 146 mg/dL (ref 0–200)
HDL: 32.3 mg/dL — ABNORMAL LOW (ref >39.00)
LDL Cholesterol: 83 mg/dL (ref 0–99)
Total CHOL/HDL Ratio: 5
Triglycerides: 153 mg/dL — ABNORMAL HIGH (ref 0.0–149.0)
VLDL: 30.6 mg/dL (ref 0.0–40.0)

## 2011-03-26 ENCOUNTER — Encounter: Payer: Self-pay | Admitting: Cardiovascular Disease

## 2011-04-06 ENCOUNTER — Other Ambulatory Visit: Payer: Self-pay | Admitting: *Deleted

## 2011-04-10 ENCOUNTER — Telehealth: Payer: Self-pay | Admitting: Cardiovascular Disease

## 2011-04-10 DIAGNOSIS — I2510 Atherosclerotic heart disease of native coronary artery without angina pectoris: Secondary | ICD-10-CM

## 2011-04-10 MED ORDER — ISOSORBIDE MONONITRATE ER 30 MG PO TB24: ORAL_TABLET | ORAL | Status: DC

## 2011-04-10 NOTE — Telephone Encounter (Signed)
Pt on isosorbide and was taking one a day, picked up refill and was 1/2 tab a day was it changed? pls call pt's wife

## 2011-04-10 NOTE — Telephone Encounter (Signed)
Patient should be taking 30 mg daily. Will send new prescription.

## 2011-05-04 ENCOUNTER — Other Ambulatory Visit: Payer: Self-pay | Admitting: Cardiovascular Disease

## 2011-05-07 ENCOUNTER — Other Ambulatory Visit: Payer: Self-pay | Admitting: *Deleted

## 2011-05-07 DIAGNOSIS — I10 Essential (primary) hypertension: Secondary | ICD-10-CM

## 2011-05-07 DIAGNOSIS — E785 Hyperlipidemia, unspecified: Secondary | ICD-10-CM

## 2011-05-07 DIAGNOSIS — I2510 Atherosclerotic heart disease of native coronary artery without angina pectoris: Secondary | ICD-10-CM

## 2011-05-07 MED ORDER — ISOSORBIDE MONONITRATE ER 30 MG PO TB24: ORAL_TABLET | ORAL | Status: DC

## 2011-05-07 MED ORDER — CLOPIDOGREL BISULFATE 75 MG PO TABS: 75.0000 mg | ORAL_TABLET | Freq: Every day | ORAL | Status: DC

## 2011-05-07 MED ORDER — AMLODIPINE BESYLATE 5 MG PO TABS: 5.0000 mg | ORAL_TABLET | Freq: Every day | ORAL | Status: DC

## 2011-05-07 MED ORDER — ROSUVASTATIN CALCIUM 40 MG PO TABS: 40.0000 mg | ORAL_TABLET | Freq: Every day | ORAL | Status: DC

## 2011-05-07 MED ORDER — ATENOLOL 50 MG PO TABS: ORAL_TABLET | ORAL | Status: DC

## 2011-05-07 MED ORDER — HYDROCHLOROTHIAZIDE 50 MG PO TABS: ORAL_TABLET | ORAL | Status: DC

## 2011-05-07 MED ORDER — PANTOPRAZOLE SODIUM 40 MG PO TBEC: 40.0000 mg | DELAYED_RELEASE_TABLET | Freq: Every day | ORAL | Status: DC

## 2011-05-07 MED ORDER — RAMIPRIL 10 MG PO CAPS: 10.0000 mg | ORAL_CAPSULE | Freq: Two times a day (BID) | ORAL | Status: DC

## 2011-05-07 MED ORDER — POTASSIUM CHLORIDE ER 10 MEQ PO TBCR: 10.0000 meq | EXTENDED_RELEASE_TABLET | Freq: Every day | ORAL | Status: DC

## 2011-05-12 ENCOUNTER — Telehealth: Payer: Self-pay | Admitting: Cardiovascular Disease

## 2011-05-12 DIAGNOSIS — E785 Hyperlipidemia, unspecified: Secondary | ICD-10-CM

## 2011-05-12 NOTE — Telephone Encounter (Signed)
Per pt wife call pt needs, Dr. Clifton Vail or his nurse to call [facility] as soon as possible. It will take 8 days for them to mail pt RX. Pt said [facility] has been trying to get in touch with [place] since July 19th. Please return call to [facility] to discuss.

## 2011-05-12 NOTE — Telephone Encounter (Signed)
Per pharmacy call, regarding crestor 40 mg. Pharmacy staff need help to clarify some information. Please return call to advise.

## 2011-05-13 NOTE — Telephone Encounter (Signed)
Pt wife calling a third time to check on status of pt RX refill request called into [facility]. Pt works out of town in [place] and needs medication to be called in today.

## 2011-05-13 NOTE — Telephone Encounter (Signed)
[place] pt. 

## 2011-05-14 NOTE — Telephone Encounter (Signed)
Pt calling back , out of med and insists it be called in today, her insurance won't cover after today she says

## 2011-05-15 ENCOUNTER — Telehealth: Payer: Self-pay | Admitting: *Deleted

## 2011-05-15 NOTE — Telephone Encounter (Signed)
Pt's wife called - pt c/o ankle swelling & discomfort off an on but more recently. Last OV w/Norins >1 year. Scheduled for f/u OV next week, 30 min spot.

## 2011-05-15 NOTE — Telephone Encounter (Signed)
Per pt wife calling, [facility] said we forgot to call in refill request for crestor 40 mg and folbic. Please call  [phone], option 2 to request refill of RX. Please return call to inform when RX has been called to [facility].

## 2011-05-18 MED ORDER — ROSUVASTATIN CALCIUM 40 MG PO TABS: 20.0000 mg | ORAL_TABLET | Freq: Every day | ORAL | Status: DC

## 2011-05-19 ENCOUNTER — Telehealth: Payer: Self-pay | Admitting: Cardiovascular Disease

## 2011-05-19 NOTE — Telephone Encounter (Signed)
Per pt wife call, pt needing fodic tablets (vitamin B-6), and nitrostat .4 mg called into [facility]. Please call this RX refills into [facility].

## 2011-05-20 MED ORDER — NITROGLYCERIN 0.4 MG SL SUBL: 0.4000 mg | SUBLINGUAL_TABLET | SUBLINGUAL | Status: DC | PRN

## 2011-05-20 MED ORDER — FOLIC ACID-VIT B6-VIT B12 0.4-50-0.1 MG PO TABS: 1.0000 | ORAL_TABLET | Freq: Every day | ORAL | Status: DC

## 2011-05-20 NOTE — Telephone Encounter (Signed)
LMOM for pt that rx was sent into pharmacy

## 2011-05-22 ENCOUNTER — Ambulatory Visit (INDEPENDENT_AMBULATORY_CARE_PROVIDER_SITE_OTHER)
Admission: RE | Admit: 2011-05-22 | Discharge: 2011-05-22 | Disposition: A | Discharge status: Home / Self Care | Payer: 59 | Source: Ambulatory Visit | Attending: Internal Medicine | Admitting: Internal Medicine

## 2011-05-22 ENCOUNTER — Ambulatory Visit (INDEPENDENT_AMBULATORY_CARE_PROVIDER_SITE_OTHER): Payer: 59 | Admitting: Internal Medicine

## 2011-05-22 ENCOUNTER — Other Ambulatory Visit: Payer: Self-pay | Admitting: Internal Medicine

## 2011-05-22 VITALS — BP 126/80 | HR 56 | Temp 97.2°F | Wt 194.0 lb

## 2011-05-22 DIAGNOSIS — M25579 Pain in unspecified ankle and joints of unspecified foot: Secondary | ICD-10-CM

## 2011-05-22 DIAGNOSIS — M25571 Pain in right ankle and joints of right foot: Secondary | ICD-10-CM

## 2011-05-22 DIAGNOSIS — M25572 Pain in left ankle and joints of left foot: Secondary | ICD-10-CM

## 2011-05-24 NOTE — Progress Notes (Signed)
  Subjective:    Patient ID: [name], male    DOB: 1951/02/20, 60 y.o.   MRN: [id number]  HPI [name] presents with a c/o left ankle pain, made worse with prolonged standing and walking. He describes the pain as a deep ache which becomes worse throughout the day. He will have tenderness to touch and mild erythema and skin will be red. No previous h/o gout or significant arthritis. He denies any trauma. He does get relief with aleve.   PMH, FamHx and SocHx reviewed for any changes and relevance.    Review of Systems System review is negative for any constitutional, cardiac, pulmonary, GI or neuro symptoms or complaints     Objective:   Physical Exam Vitals noted - normal Gen';l - WNWD white male in no distress Ankle- no swelling Normal flexion, extension, eversion. Mild tenderness to inversion. Tender to palpation along the tarsal - calcaneal joint.       Assessment & Plan:  Ankle pain - several week h/o pain worse with weight bearing and walking. Exam with tenderness but no erythema, heat or acute tenderness. Ddx - OA vs gout 9 less likely) vs stress fracture.  Plan - 2 view ankle           Continue with aleve           Supportive high top shoes.   Addendum: *RADIOLOGY REPORT*  Clinical Data: Left ankle pain, no known injury  LEFT ANKLE - 2 VIEW  Comparison: None  Findings:  Provided history states RIGHT ankle pain.  Patient states LEFT ankle pain.  Order is for LEFT ankle two views.  I confirmed with Dr. Alvera Novel office staff that LEFT ankle two views  is requested exam and side of patient's symptoms, and this is the  side which was imaged.  Osseous mineralization normal.  Narrowing of ankle mortise medially compatible with degenerative  changes.  Well-formed old appearing ossicle identified adjacent to tip of  medial malleolus.  Spurring identified at anterior margin of distal tibia.  Additional ossicles versus spurs at dorsal margin of talus.  No acute fracture  or dislocation.  No focal areas of bone destruction identified.  IMPRESSION:  Tibiotalar degenerative changes with joint space narrowing and  spurring.  Old appearing ossicle at medial malleolus.  No acute abnormalities.  Original Report Authenticated By: Lollie Marrow, M.D.   Ploan - continue use of aleve as needed.

## 2011-05-25 ENCOUNTER — Other Ambulatory Visit: Payer: Self-pay | Admitting: *Deleted

## 2011-05-25 MED ORDER — NITROGLYCERIN 0.4 MG SL SUBL: 0.4000 mg | SUBLINGUAL_TABLET | SUBLINGUAL | Status: DC | PRN

## 2011-05-26 ENCOUNTER — Telehealth: Payer: Self-pay | Admitting: Cardiovascular Disease

## 2011-05-26 ENCOUNTER — Telehealth: Payer: Self-pay | Admitting: *Deleted

## 2011-05-26 NOTE — Telephone Encounter (Signed)
Per [facility] calling regarding clarifying drug name on RX. Spoke w/ Amy earlier today.  Reference# [id number] Needs response before 4p on Thursday.

## 2011-05-26 NOTE — Telephone Encounter (Signed)
Informed pt that xray revealed arthritis in the ankle and to take aleve prn.

## 2011-05-26 NOTE — Telephone Encounter (Signed)
Message copied by [facility], Wade Asebedo B on Tue May 26, 2011 10:36 AM ------      Message from: Jacques Navy      Created: Sun May 24, 2011 12:25 PM       Please call patient : x-ray reveals arthritis ankle joint between tibia and ankle bone. Continue aleve as needed. No gout and no stress fracture. Thanks

## 2011-08-14 ENCOUNTER — Telehealth: Payer: Self-pay | Admitting: *Deleted

## 2011-08-14 NOTE — Telephone Encounter (Signed)
Received DOT paperwork in office for Dr.McAlhany to complete. Call back person to contact regarding paperwork was wife--Loretta [phone].  Pt was last seen by Dr. Clifton Caliber in Feb. 2012 with 6 month follow up recommended.  I spoke with wife and told her pt would need to be seen in office and be evaluated prior to paperwork being completed. She will check husband's schedule and call back to make appt.

## 2012-02-15 ENCOUNTER — Encounter: Payer: Self-pay | Admitting: *Deleted

## 2012-02-18 ENCOUNTER — Ambulatory Visit (INDEPENDENT_AMBULATORY_CARE_PROVIDER_SITE_OTHER): Payer: 59 | Admitting: Physician Assistant

## 2012-02-18 ENCOUNTER — Telehealth: Payer: Self-pay | Admitting: *Deleted

## 2012-02-18 ENCOUNTER — Encounter: Payer: Self-pay | Admitting: Physician Assistant

## 2012-02-18 VITALS — BP 116/60 | HR 50 | Ht 71.0 in | Wt 190.0 lb

## 2012-02-18 DIAGNOSIS — I10 Essential (primary) hypertension: Secondary | ICD-10-CM

## 2012-02-18 DIAGNOSIS — I6529 Occlusion and stenosis of unspecified carotid artery: Secondary | ICD-10-CM

## 2012-02-18 DIAGNOSIS — E785 Hyperlipidemia, unspecified: Secondary | ICD-10-CM

## 2012-02-18 DIAGNOSIS — I2510 Atherosclerotic heart disease of native coronary artery without angina pectoris: Secondary | ICD-10-CM

## 2012-02-18 DIAGNOSIS — K219 Gastro-esophageal reflux disease without esophagitis: Secondary | ICD-10-CM

## 2012-02-18 DIAGNOSIS — N529 Male erectile dysfunction, unspecified: Secondary | ICD-10-CM

## 2012-02-18 LAB — HEPATIC FUNCTION PANEL
ALT: 23 U/L (ref 0–53)
AST: 20 U/L (ref 0–37)
Albumin: 4.1 g/dL (ref 3.5–5.2)
Alkaline Phosphatase: 47 U/L (ref 39–117)
Bilirubin, Direct: 0.1 mg/dL (ref 0.0–0.3)
Total Bilirubin: 0.9 mg/dL (ref 0.3–1.2)
Total Protein: 7 g/dL (ref 6.0–8.3)

## 2012-02-18 LAB — BASIC METABOLIC PANEL
BUN: 13 mg/dL (ref 6–23)
CO2: 30 mEq/L (ref 19–32)
Calcium: 9.5 mg/dL (ref 8.4–10.5)
Chloride: 107 mEq/L (ref 96–112)
Creatinine, Ser: 1.2 mg/dL (ref 0.4–1.5)
GFR: 67.09 mL/min (ref >60.00)
Glucose, Bld: 110 mg/dL — ABNORMAL HIGH (ref 70–99)
Potassium: 4 mEq/L (ref 3.5–5.1)
Sodium: 143 mEq/L (ref 135–145)

## 2012-02-18 LAB — LIPID PANEL
Cholesterol: 158 mg/dL (ref 0–200)
HDL: 32.9 mg/dL — ABNORMAL LOW (ref >39.00)
LDL Cholesterol: 112 mg/dL — ABNORMAL HIGH (ref 0–99)
Total CHOL/HDL Ratio: 5
Triglycerides: 68 mg/dL (ref 0.0–149.0)
VLDL: 13.6 mg/dL (ref 0.0–40.0)

## 2012-02-18 MED ORDER — ROSUVASTATIN CALCIUM 40 MG PO TABS: 20.0000 mg | ORAL_TABLET | Freq: Every day | ORAL | Status: DC

## 2012-02-18 NOTE — Telephone Encounter (Signed)
Message copied by Tarri Fuller on Thu Feb 18, 2012  5:01 PM ------      Message from: [place], [place] T      Created: Thu Feb 18, 2012  1:54 PM       Increase Crestor to 40 mg QD      Repeat FLP and LFTs in 3 mos.      Mildly elevated fasting sugar         -  Follow up with PCP      Tereso Newcomer, PA-C  1:54 PM 02/18/2012

## 2012-02-18 NOTE — Telephone Encounter (Signed)
lmom ptcb to discuss lab results. needs to increase crestor to 40 mg daily

## 2012-02-18 NOTE — Progress Notes (Signed)
[address] [place], [place]  [address] Phone: [phone] Fax:  [phone]  Date:  02/18/2012   Name:  [name]       DOB:  20-Jan-1951 MRN:  [id number]  PCP:  Illene Regulus, MD, MD  Primary Cardiologist:  Dr. Verne Carrow  Primary Electrophysiologist:  None    History of Present Illness: [name] is a 61 y.o. male who returns for cardiology follow up.  He has a history of CAD, HTN, hyperlipidemia, carotid artery disease s/p CEA.  He is a prior patient of Dr. Juanda Chance. In 2002 he had a bare metal stent placed to the right coronary artery. A catheterization in 2008 which showed nonobstructive disease (oLAD 40%, pRCA stent ok with 20%, EF 60%). Had a Myoview scan in March 2011 which was negative for ischemia. Carotids 6/12: 0-39% bilateral ICA stenosis - f/u in 2 years.  Last seen by Dr. Verne Carrow 11/2010.  Patient had recently been admitted to [facility] in June 2011 with chest pain and had a negative workup per the pt including a stress test.  Plan was for 6 mos follow up.  He is doing well.  The patient denies chest pain, shortness of breath, syncope, orthopnea, PND or significant pedal edema.   Past Medical History  Diagnosis Date  . Coronary artery disease     s/p BMS to RCA 2002; LHC 2008: oLAD 40%, pRCA stent ok with 20%, EF 60%); Myoview scan in March 2011 which was negative for ischemia   . Hypertension   . Hyperlipidemia   . Bradycardia   . Carotid artery disease     Right CEA;  dopplers 6/12:  0-39% bilat ICA  . GERD (gastroesophageal reflux disease)   . Obstructive sleep apnea     Current Outpatient Prescriptions  Medication Sig Dispense Refill  . amLODipine (NORVASC) 5 MG tablet Take 1 tablet (5 mg total) by mouth daily.  90 tablet  3  . aspirin 81 MG tablet Take 81 mg by mouth daily.        Marland Kitchen atenolol (TENORMIN) 50 MG tablet Take 1/2 tablet daily  45 tablet  3  . clopidogrel (PLAVIX) 75 MG tablet Take 1  tablet (75 mg total) by mouth daily.  90 tablet  3  . Folic Acid-Vit B6-Vit B12 0.4-50-0.1 MG TABS Take 1 tablet by mouth daily.  90 each  3  . hydrochlorothiazide 50 MG tablet Take 1/2 tablet daily  45 tablet  3  . isosorbide mononitrate (IMDUR) 30 MG 24 hr tablet Take one tablet by mouth daily.  90 tablet  3  . nitroGLYCERIN (NITROSTAT) 0.4 MG SL tablet Place 1 tablet (0.4 mg total) under the tongue every 5 (five) minutes as needed for chest pain.  25 tablet  2  . pantoprazole (PROTONIX) 40 MG tablet Take 1 tablet (40 mg total) by mouth daily.  90 tablet  3  . potassium chloride (K-DUR) 10 MEQ tablet Take 1 tablet (10 mEq total) by mouth daily.  90 tablet  3  . ramipril (ALTACE) 10 MG capsule Take 1 capsule (10 mg total) by mouth 2 (two) times daily.  180 capsule  3  . rosuvastatin (CRESTOR) 40 MG tablet Take 0.5 tablets (20 mg total) by mouth daily.  45 tablet  6    Allergies: Allergies  Allergen Reactions  . Felodipine Swelling  . Niacin     REACTION: not tolerated.    History  Substance Use  Topics  . Smoking status: Former Smoker -- 1.0 packs/day for 30 years    Types: Cigarettes    Quit date: 10/19/2000  . Smokeless tobacco: Former Neurosurgeon    Types: Snuff  . Alcohol Use: Yes     Occasional beer     ROS:  Please see the history of present illness.   Occasional dyspepsia.  No dysphagia, hematemesis, weight loss or hematochezia.    Notes problems with erections.  All other systems reviewed and negative.   PHYSICAL EXAM: VS:  BP 116/60  Pulse 50  Ht 5\' 11"  (1.803 m)  Wt 190 lb (86.183 kg)  BMI 26.50 kg/m2 Well nourished, well developed, in no acute distress HEENT: normal Neck: no JVD Vascular: no carotid bruits Cardiac:  normal S1, S2; RRR; no murmur Lungs:  clear to auscultation bilaterally, no wheezing, rhonchi or rales Abd: soft, nontender, no hepatomegaly Ext: no edema Skin: warm and dry Neuro:  CNs 2-12 intact, no focal abnormalities noted  EKG:  Sinus  bradycardia, heart rate 50, normal axis, no ischemic changes  ASSESSMENT AND PLAN:  1. CAD Stable.  No angina.  Continue current Rx.  Follow up with Dr. Verne Carrow in 6 mos.   2. HTN Controlled.  Continue current therapy. Check bmet today.  3. Hyperlipidemia Check Lipids and LFTs today.   4. Carotid Stenosis Stable by recent dopplers.  Repeat 03/2013.   5. GERD Stable on PPI.   6. Erectile Dysfunction Cannot take PDE's as he is on nitrates.  He can discuss further with PCP or possible referral to urology.    Luna Glasgow, PA-C  9:13 AM 02/18/2012

## 2012-02-18 NOTE — Patient Instructions (Addendum)
Your physician wants you to follow-up in: 6 MONTHS WIT DR. Theodoro Parma will receive a reminder letter in the mail two months in advance. If you don't receive a letter, please call our office to schedule the follow-up appointment.   LABS TODAY BMET, LFT'S, LIPID  REFILL FOR CRESTOR SENT TO [facility] TODAY

## 2012-02-19 ENCOUNTER — Other Ambulatory Visit: Payer: Self-pay | Admitting: *Deleted

## 2012-02-19 NOTE — Telephone Encounter (Signed)
lmom x 2 for ptcb to discuss lab results. I did lm that pt needs to increase crestor to 40 mg daily and repeat L/L in 3 months and to f/u w/PCP for elevated glucose

## 2012-02-24 ENCOUNTER — Telehealth: Payer: Self-pay | Admitting: *Deleted

## 2012-02-24 ENCOUNTER — Telehealth: Payer: Self-pay | Admitting: Pulmonary Disease

## 2012-02-24 DIAGNOSIS — G4733 Obstructive sleep apnea (adult) (pediatric): Secondary | ICD-10-CM

## 2012-02-24 NOTE — Telephone Encounter (Signed)
LMTCB x 1 

## 2012-02-24 NOTE — Telephone Encounter (Signed)
Pt's wife returned call & can be reached at work at [phone], ask for Margaretha Glassing or mobile [phone] Antionette Fairy

## 2012-02-24 NOTE — Telephone Encounter (Signed)
Ok to send script for hose, but not for anything else until I see him.

## 2012-02-24 NOTE — Telephone Encounter (Signed)
New Problem:     Patient returned your call and needs a new prescription needs to be sent to the insurance company with the new dose.  Please call back if you have any questions.

## 2012-02-24 NOTE — Telephone Encounter (Signed)
Spoke with pt's spouse. She states that she is unsure where the order for hose needs to be sent to. She states will check with spouse and call back tomorrow

## 2012-02-24 NOTE — Telephone Encounter (Signed)
KC, please advise if you are okay doing this. Thanks.

## 2012-02-25 NOTE — Telephone Encounter (Signed)
Pt's wife called to give mailing address it is as follows:  [address] [place], [place] [address].Raylene Everts

## 2012-02-25 NOTE — Telephone Encounter (Signed)
Called, spoke with pt's wife.  She thought we were going to be mailing them the hose.  I explained to her we will place the order for a dme to supply this.  Someone will contact them to get this scheduled.  Per Margaretha Glassing, pt was using [facility] but they no longer accept his insurance.  She does not care which dme will supply the hose.    Order placed - [facility] aware.  Also, we did schedule pt a sleep consult with Dr. Shelle Iron on Aug 2 at 3:30.  Pt to arrive at 3:15 and bring all meds with him to appt.  Wife aware.

## 2012-04-07 ENCOUNTER — Telehealth: Payer: Self-pay | Admitting: Cardiovascular Disease

## 2012-04-07 DIAGNOSIS — I2510 Atherosclerotic heart disease of native coronary artery without angina pectoris: Secondary | ICD-10-CM

## 2012-04-07 DIAGNOSIS — I10 Essential (primary) hypertension: Secondary | ICD-10-CM

## 2012-04-07 NOTE — Telephone Encounter (Signed)
Refill-F/U  Patient wife called for status on med refill, she can be reached at [phone]

## 2012-04-07 NOTE — Telephone Encounter (Signed)
Last ov pt was to have refill of all meds and none were called in, to [facility] [phone] pt working out of town and out of some of these meds, needs called in asap, wife said she told nurse he needed refill and gave her the phone numbers

## 2012-04-08 ENCOUNTER — Telehealth: Payer: Self-pay | Admitting: Physician Assistant

## 2012-04-08 MED ORDER — CLOPIDOGREL BISULFATE 75 MG PO TABS: 75.0000 mg | ORAL_TABLET | Freq: Every day | ORAL | Status: DC

## 2012-04-08 MED ORDER — RAMIPRIL 10 MG PO CAPS: 10.0000 mg | ORAL_CAPSULE | Freq: Two times a day (BID) | ORAL | Status: DC

## 2012-04-08 MED ORDER — POTASSIUM CHLORIDE ER 10 MEQ PO TBCR: 10.0000 meq | EXTENDED_RELEASE_TABLET | Freq: Every day | ORAL | Status: DC

## 2012-04-08 MED ORDER — ATENOLOL 50 MG PO TABS: ORAL_TABLET | ORAL | Status: DC

## 2012-04-08 MED ORDER — ISOSORBIDE MONONITRATE ER 30 MG PO TB24: ORAL_TABLET | ORAL | Status: DC

## 2012-04-08 MED ORDER — HYDROCHLOROTHIAZIDE 50 MG PO TABS: ORAL_TABLET | ORAL | Status: DC

## 2012-04-08 MED ORDER — ROSUVASTATIN CALCIUM 40 MG PO TABS: 40.0000 mg | ORAL_TABLET | Freq: Every day | ORAL | Status: DC

## 2012-04-08 MED ORDER — AMLODIPINE BESYLATE 5 MG PO TABS: 5.0000 mg | ORAL_TABLET | Freq: Every day | ORAL | Status: DC

## 2012-04-08 NOTE — Telephone Encounter (Signed)
Pt's wife calling back, left two messages yesterday, pls call

## 2012-04-08 NOTE — Telephone Encounter (Signed)
pt's wfie cb and said she [facility] said still did not have med orders. I then called [facility] and s/w pharmacist today and she states she has 8 scripts to be filled. she verified to make sure it was the same meds, sending out today RUSH!!, wife notified

## 2012-04-08 NOTE — Telephone Encounter (Signed)
s/w pt's wife and I stated that looks like meds were sent out to [facility] on 6/19 but that I went ahead and re-sent them again today. I told her to let me know what else I need to do to help her. 

## 2012-04-08 NOTE — Telephone Encounter (Signed)
s/w pt's wife and I stated that looks like meds were sent out to [facility] on 6/19 but that I went ahead and re-sent them again today. I told her to let me know what else I need to do to help her.

## 2012-05-17 ENCOUNTER — Telehealth: Payer: Self-pay | Admitting: Cardiovascular Disease

## 2012-05-17 ENCOUNTER — Other Ambulatory Visit (HOSPITAL_COMMUNITY): Payer: Self-pay

## 2012-05-17 MED ORDER — PANTOPRAZOLE SODIUM 40 MG PO TBEC: 40.0000 mg | DELAYED_RELEASE_TABLET | Freq: Every day | ORAL | Status: DC

## 2012-05-17 NOTE — Telephone Encounter (Signed)
New Problem:    Patient's wife called in needing a refill of her husbands pantoprazole (PROTONIX) 40 MG tablet fax [phone]. Please call back when the order has been placed.

## 2012-05-17 NOTE — Telephone Encounter (Signed)
rx refilled and patient's wife notified

## 2012-05-17 NOTE — Telephone Encounter (Signed)
..   Requested Prescriptions   Signed Prescriptions Disp Refills  . pantoprazole (PROTONIX) 40 MG tablet 90 tablet 3    Sig: Take 1 tablet (40 mg total) by mouth daily.    Authorizing Provider: Verne Carrow D    Ordering User: Christella Hartigan, Yara Tomkinson Judie Petit

## 2012-05-20 ENCOUNTER — Institutional Professional Consult (permissible substitution): Payer: 59 | Admitting: Pulmonary Disease

## 2012-06-16 ENCOUNTER — Telehealth: Payer: Self-pay | Admitting: Pulmonary Disease

## 2012-06-16 NOTE — Telephone Encounter (Signed)
Pt given appt with Dr Maple Hudson on 06-22-12 at 11:15 to re establish for sleep.

## 2012-06-22 ENCOUNTER — Ambulatory Visit (INDEPENDENT_AMBULATORY_CARE_PROVIDER_SITE_OTHER): Payer: 59 | Admitting: Internal Medicine

## 2012-06-22 ENCOUNTER — Encounter: Payer: Self-pay | Admitting: Internal Medicine

## 2012-06-22 VITALS — BP 130/76 | HR 61 | Ht 71.0 in | Wt 200.6 lb

## 2012-06-22 DIAGNOSIS — G4733 Obstructive sleep apnea (adult) (pediatric): Secondary | ICD-10-CM

## 2012-06-22 NOTE — Progress Notes (Signed)
06/22/12- 15 yoM former smoker self-referred for Sleep Medicine evaluation because of sleep apnea Self referral- originally dx'd OSA in 2009 after his LHC cardiologist was concerned in context of CAD/ stents. NPSG 02/04/08- RDI 5.1/ hr based on RERAs. Wife had complained of his loud snoring and recognized apneas. He saw Dr Shelle Iron once and was fitted w/ CPAP at unknown pressure, through a DME in [place] ? [facility]?. They gave him replacement hose and supplies recently, but asked him to re-establish here to suport ongoing care. He states he sleeps better every night   Prior to Admission medications   Medication Sig Start Date End Date Taking? Authorizing Provider  amLODipine (NORVASC) 5 MG tablet Take 1 tablet (5 mg total) by mouth daily. 04/08/12  Yes Beatrice Lecher, PA  aspirin 81 MG tablet Take 81 mg by mouth daily.     Yes Historical Provider, MD  atenolol (TENORMIN) 50 MG tablet Take 1/2 tablet daily 04/08/12  Yes Beatrice Lecher, PA  clopidogrel (PLAVIX) 75 MG tablet Take 1 tablet (75 mg total) by mouth daily. 04/08/12 04/08/13 Yes Beatrice Lecher, PA  Folic Acid-Vit B6-Vit B12 0.4-50-0.1 MG TABS Take 1 tablet by mouth daily. 05/20/11  Yes Kathleene Hazel, MD  hydrochlorothiazide (HYDRODIURIL) 50 MG tablet Take 1/2 tablet daily 04/08/12  Yes Beatrice Lecher, PA  isosorbide mononitrate (IMDUR) 30 MG 24 hr tablet Take one tablet by mouth daily. 04/08/12  Yes Beatrice Lecher, PA  nitroGLYCERIN (NITROSTAT) 0.4 MG SL tablet Place 1 tablet (0.4 mg total) under the tongue every 5 (five) minutes as needed for chest pain. 05/25/11 07/22/12 Yes Kathleene Hazel, MD  pantoprazole (PROTONIX) 40 MG tablet Take 1 tablet (40 mg total) by mouth daily. 05/17/12 05/17/13 Yes Kathleene Hazel, MD  potassium chloride (K-DUR) 10 MEQ tablet Take 1 tablet (10 mEq total) by mouth daily. 04/08/12 04/08/13 Yes Beatrice Lecher, PA  ramipril (ALTACE) 10 MG capsule Take 1 capsule (10 mg total) by mouth 2 (two)  times daily. 04/08/12  Yes Beatrice Lecher, PA  rosuvastatin (CRESTOR) 40 MG tablet Take 1 tablet (40 mg total) by mouth daily. 04/08/12  Yes Beatrice Lecher, PA   Family History  Problem Relation Age of Onset  . Heart failure Father   . Peripheral vascular disease Mother     with pacemaker, and had a carotid endarterectomy  . Hypertension Brother   . Hypertension Brother    History   Social History  . Marital Status: Married    Spouse Name: N/A    Number of Children: N/A  . Years of Education: N/A   Occupational History  . Not on file.   Social History Main Topics  . Smoking status: Former Smoker -- 1.0 packs/day for 30 years    Types: Cigarettes    Quit date: 10/19/2000  . Smokeless tobacco: Former Neurosurgeon    Types: Snuff  . Alcohol Use: Yes     Occasional beer  . Drug Use: No  . Sexually Active: Not on file   Other Topics Concern  . Not on file   Social History Narrative  . No narrative on file   ROS-see HPI Constitutional:   No-   weight loss, night sweats, fevers, chills, fatigue, lassitude. HEENT:   No-  headaches, difficulty swallowing, tooth/dental problems, sore throat,       No-  sneezing, itching, ear ache, nasal congestion, post nasal drip,  CV:  No-   chest  pain, orthopnea, PND, swelling in lower extremities, anasarca,  dizziness, palpitations Resp: No-   shortness of breath with exertion or at rest.              No-   productive cough,  No non-productive cough,  No- coughing up of blood.              No-   change in color of mucus.  No- wheezing.   Skin: No-   rash or lesions. GI:  No-   heartburn, indigestion, abdominal pain, nausea, vomiting, diarrhea,                 change in bowel habits, loss of appetite GU: No-   dysuria, change in color of urine, no urgency or frequency.  No- flank pain. MS:  No-   joint pain or swelling.  No- decreased range of motion.  No- back pain. Neuro-     nothing unusual Psych:  No- change in mood or affect. No depression or  anxiety.  No memory loss.  OBJ- Physical Exam General- Alert, Oriented, Affect-appropriate, Distress- none acute, trim Skin- rash-none, lesions- none, excoriation- none Lymphadenopathy- none Head- atraumatic            Eyes- Gross vision intact, PERRLA, conjunctivae and secretions clear            Ears- Hearing, canals-normal            Nose- Clear, no-Septal dev, mucus, polyps, erosion, perforation             Throat- Mallampati III-IV , mucosa clear , drainage- none, tonsils- atrophic, dentures Neck- flexible , trachea midline, no stridor , thyroid nl, carotid no bruit Chest - symmetrical excursion , unlabored           Heart/CV- RRR , no murmur , no gallop  , no rub, nl s1 s2                           - JVD- none , edema- none, stasis changes- none, varices- none           Lung- clear to P&A, wheeze- none, cough- none , dullness-none, rub- none           Chest wall-  Abd- tender-no, distended-no, bowel sounds-present, HSM- no Br/ Gen/ Rectal- Not done, not indicated Extrem- cyanosis- none, clubbing, none, atrophy- none, strength- nl Neuro- grossly intact to observation

## 2012-06-22 NOTE — Patient Instructions (Addendum)
Order- [facility]ME (need to verify it is [facility] in [place])  Replacement CPAP mask of choice and supplies   Dx OSA              Please ask for our records- what is the pressure setting?   Please call as needed

## 2012-07-01 ENCOUNTER — Ambulatory Visit (INDEPENDENT_AMBULATORY_CARE_PROVIDER_SITE_OTHER): Payer: 59 | Admitting: Physician Assistant

## 2012-07-01 ENCOUNTER — Encounter: Payer: Self-pay | Admitting: Physician Assistant

## 2012-07-01 VITALS — BP 131/78 | HR 57 | Ht 71.0 in | Wt 196.0 lb

## 2012-07-01 DIAGNOSIS — I2510 Atherosclerotic heart disease of native coronary artery without angina pectoris: Secondary | ICD-10-CM

## 2012-07-01 DIAGNOSIS — I10 Essential (primary) hypertension: Secondary | ICD-10-CM

## 2012-07-01 DIAGNOSIS — I6529 Occlusion and stenosis of unspecified carotid artery: Secondary | ICD-10-CM

## 2012-07-01 DIAGNOSIS — E785 Hyperlipidemia, unspecified: Secondary | ICD-10-CM

## 2012-07-01 LAB — LIPID PANEL
Cholesterol: 158 mg/dL (ref 0–200)
HDL: 27.6 mg/dL — ABNORMAL LOW (ref >39.00)
LDL Cholesterol: 103 mg/dL — ABNORMAL HIGH (ref 0–99)
Total CHOL/HDL Ratio: 6
Triglycerides: 135 mg/dL (ref 0.0–149.0)
VLDL: 27 mg/dL (ref 0.0–40.0)

## 2012-07-01 LAB — HEPATIC FUNCTION PANEL
ALT: 37 U/L (ref 0–53)
AST: 28 U/L (ref 0–37)
Albumin: 4.3 g/dL (ref 3.5–5.2)
Alkaline Phosphatase: 54 U/L (ref 39–117)
Bilirubin, Direct: 0.1 mg/dL (ref 0.0–0.3)
Total Bilirubin: 1.1 mg/dL (ref 0.3–1.2)
Total Protein: 7.1 g/dL (ref 6.0–8.3)

## 2012-07-01 LAB — BASIC METABOLIC PANEL
BUN: 16 mg/dL (ref 6–23)
CO2: 28 mEq/L (ref 19–32)
Calcium: 9.6 mg/dL (ref 8.4–10.5)
Chloride: 104 mEq/L (ref 96–112)
Creatinine, Ser: 1.2 mg/dL (ref 0.4–1.5)
GFR: 69.03 mL/min (ref >60.00)
Glucose, Bld: 114 mg/dL — ABNORMAL HIGH (ref 70–99)
Potassium: 3.9 mEq/L (ref 3.5–5.1)
Sodium: 140 mEq/L (ref 135–145)

## 2012-07-01 NOTE — Patient Instructions (Signed)
Schedule follow up appointment with Dr. Verne Carrow in 6 months.

## 2012-07-01 NOTE — Progress Notes (Signed)
[address] [place], [place]  [address] Phone: [phone] Fax:  [phone]  Date:  07/01/2012   Name:  [name]   DOB:  1950/10/30   MRN:  [id number]  PCP:  Illene Regulus, MD  Primary Cardiologist:  Dr. Verne Carrow  Primary Electrophysiologist:  None    History of Present Illness: [name] is a 61 y.o. male who returns for follow up.  He has a hx of CAD, s/p BMS to RCA in 2002, HTN, HL, carotid artery disease s/p CEA. Patient was admitted to [facility] in June 2011 with chest pain and had a negative workup per the pt including a stress test.  I saw him last in 5/13.  Since then he is doing well.  He denies chest pain, shortness of breath, syncope, orthopnea, PND or significant pedal edema.    Wt Readings from Last 3 Encounters:  06/22/12 200 lb 9.6 oz (90.992 kg)  02/18/12 190 lb (86.183 kg)  11/24/10 203 lb (92.08 kg)    Labs (5/13):  K 4, creatinine 1.2, ALT 23, TC 158, TG 68, HDL 32.9, LDL 112 (Crestor was to be increased)   Past Medical History  Diagnosis Date  . Coronary artery disease     s/p BMS to RCA 2002; LHC 2008: oLAD 40%, pRCA stent ok with 20%, EF 60%); Myoview scan in March 2011 which was negative for ischemia   . Hypertension   . Hyperlipidemia   . Bradycardia   . Carotid artery disease     Right CEA;  dopplers 6/12:  0-39% bilat ICA  . GERD (gastroesophageal reflux disease)   . Obstructive sleep apnea     Current Outpatient Prescriptions  Medication Sig Dispense Refill  . amLODipine (NORVASC) 5 MG tablet Take 1 tablet (5 mg total) by mouth daily.  90 tablet  3  . aspirin 81 MG tablet Take 81 mg by mouth daily.        Marland Kitchen atenolol (TENORMIN) 50 MG tablet Take 1/2 tablet daily  45 tablet  3  . clopidogrel (PLAVIX) 75 MG tablet Take 1 tablet (75 mg total) by mouth daily.  90 tablet  3  . Folic Acid-Vit B6-Vit B12 0.4-50-0.1 MG TABS Take 1 tablet by mouth daily.  90 each  3  . hydrochlorothiazide  (HYDRODIURIL) 50 MG tablet Take 1/2 tablet daily  45 tablet  3  . isosorbide mononitrate (IMDUR) 30 MG 24 hr tablet Take one tablet by mouth daily.  90 tablet  3  . nitroGLYCERIN (NITROSTAT) 0.4 MG SL tablet Place 1 tablet (0.4 mg total) under the tongue every 5 (five) minutes as needed for chest pain.  25 tablet  2  . pantoprazole (PROTONIX) 40 MG tablet Take 1 tablet (40 mg total) by mouth daily.  90 tablet  3  . potassium chloride (K-DUR) 10 MEQ tablet Take 1 tablet (10 mEq total) by mouth daily.  90 tablet  3  . ramipril (ALTACE) 10 MG capsule Take 1 capsule (10 mg total) by mouth 2 (two) times daily.  180 capsule  3  . rosuvastatin (CRESTOR) 40 MG tablet Take 1 tablet (40 mg total) by mouth daily.  90 tablet  3    Allergies: Allergies  Allergen Reactions  . Felodipine Swelling  . Niacin     REACTION: not tolerated.    History  Substance Use Topics  . Smoking status: Former Smoker -- 1.0 packs/day for 30 years    Types:  Cigarettes    Quit date: 10/19/2000  . Smokeless tobacco: Former Neurosurgeon    Types: Snuff  . Alcohol Use: Yes     Occasional beer     ROS:  Please see the history of present illness.   He denies cough, melena, hematochezia, TIA symptoms.  PHYSICAL EXAM: VS:  BP 131/78  Pulse 57  Ht 5\' 11"  (1.803 m)  Wt 196 lb (88.905 kg)  BMI 27.34 kg/m2 Well nourished, well developed, in no acute distress HEENT: normal Neck: no JVD Vascular: no carotid bruits; R CEA scar noted Cardiac:  normal S1, S2; RRR; no murmur Lungs:  clear to auscultation bilaterally, no wheezing, rhonchi or rales Abd: soft, nontender, no hepatomegaly Ext: no edema Skin: warm and dry Neuro:  CNs 2-12 intact, no focal abnormalities noted   EKG:  Sinus brady, HR 46, normal axis, no change from prior tracing      ASSESSMENT AND PLAN:  1. Coronary Artery Disease:  Doing well.  No angina.  Continue current Rx. Follow up with Dr. Verne Carrow in 6 mos.  2. Hyperlipidemia:  Check Lipids  and LFTs.  3. Hypertension:  Controlled.  Continue current therapy. Check BMET.  4. Carotid Artery Disease:  Next dopplers due in 6/14.    Signed, Tereso Newcomer, PA-C  8:28 AM 07/01/2012

## 2012-07-01 NOTE — Assessment & Plan Note (Signed)
Good compliance and control. He sleeps well now without snoring loudly when he did before CPAP. Using CPAP all night every night with a fullface mask and humidifier. Plan-replacement CPAP mask and supplies. We will need to check with his DME for pressure level.

## 2012-07-04 ENCOUNTER — Telehealth: Payer: Self-pay | Admitting: *Deleted

## 2012-07-04 ENCOUNTER — Telehealth: Payer: Self-pay | Admitting: Cardiovascular Disease

## 2012-07-04 DIAGNOSIS — E785 Hyperlipidemia, unspecified: Secondary | ICD-10-CM

## 2012-07-04 MED ORDER — OMEGA-3-ACID ETHYL ESTERS 1 G PO CAPS: 1.0000 g | ORAL_CAPSULE | Freq: Two times a day (BID) | ORAL | Status: DC

## 2012-07-04 NOTE — Telephone Encounter (Signed)
Message copied by Tarri Fuller on Mon Jul 04, 2012  3:20 PM ------      Message from: [place], [place] T      Created: Fri Jul 01, 2012  5:25 PM       LDL high ==> Confirm that he is taking Crestor 40 mg EVERY DAY.      Add Lovaza 1 gm BID      Check Lipids and LFTs in 3 mos.      Tereso Newcomer, PA-C  5:24 PM 07/01/2012

## 2012-07-04 NOTE — Telephone Encounter (Signed)
New problem    Returning call back to nurse.   

## 2012-07-04 NOTE — Telephone Encounter (Signed)
lmom ptcb to go over lab results and to start lovaza and repeat L/L 3 months. I will send in an rx for lovaza, L/L 10/18/12

## 2012-07-04 NOTE — Telephone Encounter (Signed)
Notes Recorded by Beatrice Lecher, PA on 07/01/2012 at 5:25 PM LDL high ==> Confirm that he is taking Crestor 40 mg EVERY DAY. Add Lovaza 1 gm BID Check Lipids and LFTs in 3 mos.  Advised wife and will mail Rx to have labs where they live in three months

## 2012-07-08 ENCOUNTER — Encounter: Payer: Self-pay | Admitting: Pulmonary Disease

## 2012-08-24 ENCOUNTER — Telehealth: Payer: Self-pay | Admitting: Internal Medicine

## 2012-08-24 NOTE — Telephone Encounter (Signed)
Note  Pt was set up with [facility] (who is no longer doing cpap's) Order sent to [facility] ([facility]) for  1) Replacement CPAP, mask of choice and supplies.  2) Please advise what pressure setting patient is using on his current machine for our records.  3) Pt to decide when he wants replacement device and supplies. States that he doesn't need any supplies at this time. Just needed ov to get any additional supplies. Contact patient or wife on home number. Rhonda J Alfonso Ramus will fax order to [facility]. Will forward to her to document.

## 2012-08-24 NOTE — Telephone Encounter (Signed)
LMOAM on home number for pt to return my call in regards to message that was left earlier today. DPR on file to speak with pt's wife. LMOAM (cell phone) for Mrs. Kennerly that we had received the request for cpap order to be faxed to [facility] at [phone]. Order has been faxed. Originally given to [facility] on 06/22/12. Advised to call me directly if anything else is needed. [phone]. Rhonda J Cobb

## 2012-08-24 NOTE — Telephone Encounter (Signed)
Order from 06/22/12 faxed to [facility] [phone] for Replacement CPAP, mask of choice and supplies. Please check pressure on old machine and advise Dr. Maple Hudson what current CPAP pressure is. Please call if you have any questions. Rhonda J Cobb

## 2012-08-26 ENCOUNTER — Telehealth: Payer: Self-pay | Admitting: Cardiovascular Disease

## 2012-08-26 NOTE — Telephone Encounter (Signed)
Pt's wife calling re folbic needs refill, pt now out and travels out of town, needs to get filled before leaves out of town again Sunday,  [phone] [facility]

## 2012-08-30 MED ORDER — FOLIC ACID-VIT B6-VIT B12 0.4-50-0.1 MG PO TABS: 1.0000 | ORAL_TABLET | Freq: Every day | ORAL | Status: DC

## 2012-08-30 NOTE — Telephone Encounter (Signed)
F/u   Pt wife calling for status on the this matter, need folbic ASAP. Plz return call to pt wife [phone]

## 2012-08-30 NOTE — Telephone Encounter (Signed)
Spoke with pt's wife and told her I would send refill for folic acid to [facility] today

## 2012-09-01 ENCOUNTER — Other Ambulatory Visit: Payer: Self-pay | Admitting: *Deleted

## 2012-09-01 DIAGNOSIS — I2510 Atherosclerotic heart disease of native coronary artery without angina pectoris: Secondary | ICD-10-CM

## 2012-09-01 MED ORDER — FA-PYRIDOXINE-CYANOCOBALAMIN 2.5-25-2 MG PO TABS: 1.0000 | ORAL_TABLET | Freq: Every day | ORAL | Status: DC

## 2012-10-03 ENCOUNTER — Other Ambulatory Visit: Payer: 59

## 2012-10-13 ENCOUNTER — Other Ambulatory Visit: Payer: Self-pay | Admitting: Cardiovascular Disease

## 2012-10-31 ENCOUNTER — Encounter (INDEPENDENT_AMBULATORY_CARE_PROVIDER_SITE_OTHER): Payer: 59

## 2012-10-31 ENCOUNTER — Ambulatory Visit (INDEPENDENT_AMBULATORY_CARE_PROVIDER_SITE_OTHER): Payer: 59 | Admitting: Cardiovascular Disease

## 2012-10-31 ENCOUNTER — Encounter: Payer: Self-pay | Admitting: Cardiovascular Disease

## 2012-10-31 VITALS — BP 142/88 | HR 77 | Ht 73.0 in | Wt 200.0 lb

## 2012-10-31 DIAGNOSIS — M79605 Pain in left leg: Secondary | ICD-10-CM

## 2012-10-31 DIAGNOSIS — I2510 Atherosclerotic heart disease of native coronary artery without angina pectoris: Secondary | ICD-10-CM

## 2012-10-31 DIAGNOSIS — I739 Peripheral vascular disease, unspecified: Secondary | ICD-10-CM

## 2012-10-31 DIAGNOSIS — M79609 Pain in unspecified limb: Secondary | ICD-10-CM

## 2012-10-31 DIAGNOSIS — M79604 Pain in right leg: Secondary | ICD-10-CM

## 2012-10-31 DIAGNOSIS — I10 Essential (primary) hypertension: Secondary | ICD-10-CM

## 2012-10-31 NOTE — Patient Instructions (Addendum)
Your physician recommends that you schedule a follow-up appointment in:  6 weeks.   Your physician has requested that you have an ankle brachial index (ABI). During this test an ultrasound and blood pressure cuff are used to evaluate the arteries that supply the arms and legs with blood. Allow thirty minutes for this exam. There are no restrictions or special instructions.   Your physician has recommended you make the following change in your medication:  Stop Crestor for one month to see if this helps with leg pain

## 2012-10-31 NOTE — Progress Notes (Signed)
History of Present Illness: 62 yo WM with history of CAD, HTN, hyperlipidemia, carotid artery disease s/p CEA who is here today for cardiac follow up. He has been followed in the past by Dr. Juanda Chance. In 2002 he had a bare metal stent placed to the right coronary artery. A catheterization in 2008 showed non-obstructive disease. Had a Myoview scan in March 2011 which was negative for ischemia. He had duplex studies of his carotid in June 2012 with mild disease and stable right CEA site.    He is here today for follow up. He has had no chest pain or SOB. He does c/o pain in both legs. This has been severe for the past month. It is hard to get out of bed in the morning. This gets better as the day progresses. No claudication type symptoms. No back pains. He has been taking Crestor as prescribed.   Primary Care Physician: Illene Regulus  Last Lipid Profile:Lipid Panel     Component Value Date/Time   CHOL 158 07/01/2012 0943   TRIG 135.0 07/01/2012 0943   HDL 27.60* 07/01/2012 0943   CHOLHDL 6 07/01/2012 0943   VLDL 27.0 07/01/2012 0943   LDLCALC 103* 07/01/2012 0943     Past Medical History  Diagnosis Date  . Coronary artery disease     s/p BMS to RCA 2002; LHC 2008: oLAD 40%, pRCA stent ok with 20%, EF 60%); Myoview scan in March 2011 which was negative for ischemia   . Hypertension   . Hyperlipidemia   . Bradycardia   . Carotid artery disease     Right CEA;  dopplers 6/12:  0-39% bilat ICA  . GERD (gastroesophageal reflux disease)   . Obstructive sleep apnea     Past Surgical History  Procedure Date  . Knee arthroscopy     right  . Carotid endarterectomy 06/26/2002    right  . Coronary angioplasty with stent placement 03/08/2000    Bare-metal stent in RCA, in [place]  . Cardiac catheterization 06/01/2007     EF of 65% -- Nonobstructive CAD with 40% ostial stenosis in the LAD, no significant obstruction in the circumflex artery, 20% narrowing within the stent in the proximal to  mid right coronary and normal LV function  . Cardiac catheterization 02/02/2006    EF of 50%  . Cardiac catheterization 01/19/2002    EF of 60%  . Mole removal     Prior moles removed    Current Outpatient Prescriptions  Medication Sig Dispense Refill  . amLODipine (NORVASC) 5 MG tablet Take 1 tablet (5 mg total) by mouth daily.  90 tablet  3  . aspirin 81 MG tablet Take 81 mg by mouth daily.        Marland Kitchen atenolol (TENORMIN) 50 MG tablet Take 1/2 tablet daily  45 tablet  3  . folic acid-pyridoxine-cyancobalamin (FOLBIC) 2.5-25-2 MG TABS Take 1 tablet by mouth daily.  90 each  3  . hydrochlorothiazide (HYDRODIURIL) 50 MG tablet Take 1/2 tablet daily  45 tablet  3  . isosorbide mononitrate (IMDUR) 30 MG 24 hr tablet Take one tablet by mouth daily.  90 tablet  3  . nitroGLYCERIN (NITROSTAT) 0.4 MG SL tablet Place 0.4 mg under the tongue every 5 (five) minutes as needed.      . pantoprazole (PROTONIX) 40 MG tablet Take 1 tablet (40 mg total) by mouth daily.  90 tablet  3  . PLAVIX 75 MG tablet TAKE ONE TABLET BY MOUTH EVERY DAY  30 tablet  5  . potassium chloride (K-DUR) 10 MEQ tablet Take 1 tablet (10 mEq total) by mouth daily.  90 tablet  3  . ramipril (ALTACE) 10 MG capsule Take 1 capsule (10 mg total) by mouth 2 (two) times daily.  180 capsule  3  . rosuvastatin (CRESTOR) 40 MG tablet Take 1 tablet (40 mg total) by mouth daily.  90 tablet  3  . Folic Acid-Vit B6-Vit B12 0.4-50-0.1 MG TABS Take 1 tablet by mouth daily.  90 each  3    Allergies  Allergen Reactions  . Felodipine Swelling  . Niacin     REACTION: not tolerated.    History   Social History  . Marital Status: Married    Spouse Name: N/A    Number of Children: N/A  . Years of Education: N/A   Occupational History  . Not on file.   Social History Main Topics  . Smoking status: Former Smoker -- 1.0 packs/day for 30 years    Types: Cigarettes    Quit date: 10/19/2000  . Smokeless tobacco: Former Neurosurgeon    Types: Snuff   . Alcohol Use: Yes     Comment: Occasional beer  . Drug Use: No  . Sexually Active: Not on file   Other Topics Concern  . Not on file   Social History Narrative  . No narrative on file    Family History  Problem Relation Age of Onset  . Heart failure Father   . Peripheral vascular disease Mother     with pacemaker, and had a carotid endarterectomy  . Hypertension Brother   . Hypertension Brother     Review of Systems:  As stated in the HPI and otherwise negative.   BP 142/88  Pulse 77  Ht 6\' 1"  (1.854 m)  Wt 200 lb (90.719 kg)  BMI 26.39 kg/m2  SpO2 96%  Physical Examination: General: Well developed, well nourished, NAD HEENT: OP clear, mucus membranes moist SKIN: warm, dry. No rashes. Neuro: No focal deficits Musculoskeletal: Muscle strength 5/5 all ext Psychiatric: Mood and affect normal Neck: No JVD, no carotid bruits, no thyromegaly, no lymphadenopathy. Lungs:Clear bilaterally, no wheezes, rhonci, crackles Cardiovascular: Regular rate and rhythm. No murmurs, gallops or rubs. Abdomen:Soft. Bowel sounds present. Non-tender.  Extremities: No lower extremity edema. Pulses are 2 + in the bilateral DP/PT.  Assessment and Plan:   1. Coronary Artery Disease: Doing well. No angina. Continue current therapy.  2. Hyperlipidemia: He has been on a statin. With leg pain, will try holding his Crestor for one month. If no resolution of leg pain and vascular workup negative, will resume statin in 6 weeks.   3. Hypertension: Controlled. Continue current therapy.   4. Carotid Artery Disease: Next dopplers due in 6/14.   5. Bilateral leg pain: Will arrange ABI. Pulses are normal on examination. ? Statin related. Will hold statin. If workup negative, will send to primary care.

## 2012-11-02 ENCOUNTER — Telehealth: Payer: Self-pay | Admitting: Cardiovascular Disease

## 2012-11-02 NOTE — Telephone Encounter (Signed)
I did not think his leg pain was vascular. Were his ABI normal? I can't find in computer and wasn't sure if this had been done. I am not in the office to check with Missy. I would recommend continuing to hold his Crestor for now. He will need to see primary care for further workup of his leg pain. Thanks, chris

## 2012-11-02 NOTE — Telephone Encounter (Signed)
N/A.  LMTC. 

## 2012-11-02 NOTE — Telephone Encounter (Signed)
Spoke with Missy and she confirmed ABI normal.  Left message to call back

## 2012-11-02 NOTE — Telephone Encounter (Signed)
Per wife patient received a call this am that his tests were ok, no blockage. Wife states that since stopping Crestor the leg pain is actually worse, it woke him up during night because the pain was so severe.  Wife not sure what do next. Will forward to Dr Clifton Karen for review

## 2012-11-02 NOTE — Telephone Encounter (Signed)
F/u  Returning call back to [facility]

## 2012-11-02 NOTE — Telephone Encounter (Signed)
Pt was in day before yesterday, was taking off crestor for 30 days , having pain in his legs for a few months, got severe as soon as stopped crestor two days ago, pls call [phone]

## 2012-11-02 NOTE — Telephone Encounter (Signed)
Advised wife, verbalized understanding.  

## 2012-11-03 ENCOUNTER — Ambulatory Visit (INDEPENDENT_AMBULATORY_CARE_PROVIDER_SITE_OTHER): Payer: 59 | Admitting: Internal Medicine

## 2012-11-03 ENCOUNTER — Encounter: Payer: Self-pay | Admitting: Internal Medicine

## 2012-11-03 VITALS — BP 148/88 | HR 76 | Temp 97.9°F | Resp 10 | Wt 201.0 lb

## 2012-11-03 DIAGNOSIS — M722 Plantar fascial fibromatosis: Secondary | ICD-10-CM

## 2012-11-03 NOTE — Patient Instructions (Addendum)
Plantar fasciitis - (Heel spurs) is where the pain is originating from and causing collateral inflammation in the achilles tendons up the calve.  Plan  Over the counter (Dr. Oletta Cohn) heel cups or full length orthotics  Stretches (see hand out)  Use of Ibuprofen as you need it.  If these first steps fail the next step is cortisone injections. If that fails then referral to a sports medicine doctor for diagnosis and custom orthotics. Surgery is a last resort.   Plantar Fasciitis Plantar fasciitis is a common condition that causes foot pain. It is soreness (inflammation) of the band of tough fibrous tissue on the bottom of the foot that runs from the heel bone (calcaneus) to the ball of the foot. The cause of this soreness may be from excessive standing, poor fitting shoes, running on hard surfaces, being overweight, having an abnormal walk, or overuse (this is common in runners) of the painful foot or feet. It is also common in aerobic exercise dancers and ballet dancers. SYMPTOMS   Most people with plantar fasciitis complain of:  Severe pain in the morning on the bottom of their foot especially when taking the first steps out of bed. This pain recedes after a few minutes of walking.   Severe pain is experienced also during walking following a long period of inactivity.   Pain is worse when walking barefoot or up stairs  DIAGNOSIS    Your caregiver will diagnose this condition by examining and feeling your foot.   Special tests such as X-rays of your foot, are usually not needed.  PREVENTION    Consult a sports medicine professional before beginning a new exercise program.   Walking programs offer a good workout. With walking there is a lower chance of overuse injuries common to runners. There is less impact and less jarring of the joints.   Begin all new exercise programs slowly. If problems or pain develop, decrease the amount of time or distance until you are at a comfortable level.    Wear good shoes and replace them regularly.   Stretch your foot and the heel cords at the back of the ankle (Achilles tendon) both before and after exercise.   Run or exercise on even surfaces that are not hard. For example, asphalt is better than pavement.   Do not run barefoot on hard surfaces.   If using a treadmill, vary the incline.   Do not continue to workout if you have foot or joint problems. Seek professional help if they do not improve.  HOME CARE INSTRUCTIONS    Avoid activities that cause you pain until you recover.   Use ice or cold packs on the problem or painful areas after working out.   Only take over-the-counter or prescription medicines for pain, discomfort, or fever as directed by your caregiver.   Soft shoe inserts or athletic shoes with air or gel sole cushions may be helpful.   If problems continue or become more severe, consult a sports medicine caregiver or your own health care provider. Cortisone is a potent anti-inflammatory medication that may be injected into the painful area. You can discuss this treatment with your caregiver.  MAKE SURE YOU:    Understand these instructions.   Will watch your condition.   Will get help right away if you are not doing well or get worse.  Document Released: 06/30/2001 Document Revised: 12/28/2011 Document Reviewed: 08/29/2008 [facility] Patient Information 2013 [place], [place].     Plantar Fasciitis (Heel Spur  Syndrome) with Rehab The plantar fascia is a fibrous, ligament-like, soft-tissue structure that spans the bottom of the foot. Plantar fasciitis is a condition that causes pain in the foot due to inflammation of the tissue. SYMPTOMS    Pain and tenderness on the underneath side of the foot.   Pain that worsens with standing or walking.  CAUSES   Plantar fasciitis is caused by irritation and injury to the plantar fascia on the underneath side of the foot. Common mechanisms of injury include:  Direct  trauma to bottom of the foot.   Damage to a small nerve that runs under the foot where the main fascia attaches to the heel bone.   Stress placed on the plantar fascia due to bone spurs.  RISK INCREASES WITH:    Activities that place stress on the plantar fascia (running, jumping, pivoting, or cutting).   Poor strength and flexibility.   Improperly fitted shoes.   Tight calf muscles.   Flat feet.   Failure to warm-up properly before activity.   Obesity.  PREVENTION  Warm up and stretch properly before activity.   Allow for adequate recovery between workouts.   Maintain physical fitness:   Strength, flexibility, and endurance.   Cardiovascular fitness.   Maintain a health body weight.   Avoid stress on the plantar fascia.   Wear properly fitted shoes, including arch supports for individuals who have flat feet.  PROGNOSIS   If treated properly, then the symptoms of plantar fasciitis usually resolve without surgery. However, occasionally surgery is necessary. RELATED COMPLICATIONS    Recurrent symptoms that may result in a chronic condition.   Problems of the lower back that are caused by compensating for the injury, such as limping.   Pain or weakness of the foot during push-off following surgery.   Chronic inflammation, scarring, and partial or complete fascia tear, occurring more often from repeated injections.  TREATMENT   Treatment initially involves the use of ice and medication to help reduce pain and inflammation. The use of strengthening and stretching exercises may help reduce pain with activity, especially stretches of the Achilles tendon. These exercises may be performed at home or with a therapist. Your caregiver may recommend that you use heel cups of arch supports to help reduce stress on the plantar fascia. Occasionally, corticosteroid injections are given to reduce inflammation. If symptoms persist for greater than 6 months despite non-surgical  (conservative), then surgery may be recommended.   MEDICATION    If pain medication is necessary, then nonsteroidal anti-inflammatory medications, such as aspirin and ibuprofen, or other minor pain relievers, such as acetaminophen, are often recommended.   Do not take pain medication within 7 days before surgery.   Prescription pain relievers may be given if deemed necessary by your caregiver. Use only as directed and only as much as you need.   Corticosteroid injections may be given by your caregiver. These injections should be reserved for the most serious cases, because they may only be given a certain number of times.  HEAT AND COLD  Cold treatment (icing) relieves pain and reduces inflammation. Cold treatment should be applied for 10 to 15 minutes every 2 to 3 hours for inflammation and pain and immediately after any activity that aggravates your symptoms. Use ice packs or massage the area with a piece of ice (ice massage).   Heat treatment may be used prior to performing the stretching and strengthening activities prescribed by your caregiver, physical therapist, or athletic trainer. Use a  heat pack or soak the injury in warm water.  SEEK IMMEDIATE MEDICAL CARE IF:  Treatment seems to offer no benefit, or the condition worsens.   Any medications produce adverse side effects.  EXERCISES RANGE OF MOTION (ROM) AND STRETCHING EXERCISES - Plantar Fasciitis (Heel Spur Syndrome) These exercises may help you when beginning to rehabilitate your injury. Your symptoms may resolve with or without further involvement from your physician, physical therapist or athletic trainer. While completing these exercises, remember:    Restoring tissue flexibility helps normal motion to return to the joints. This allows healthier, less painful movement and activity.   An effective stretch should be held for at least 30 seconds.   A stretch should never be painful. You should only feel a gentle lengthening or  release in the stretched tissue.  RANGE OF MOTION - Toe Extension, Flexion  Sit with your right / left leg crossed over your opposite knee.   Grasp your toes and gently pull them back toward the top of your foot. You should feel a stretch on the bottom of your toes and/or foot.   Hold this stretch for __________ seconds.   Now, gently pull your toes toward the bottom of your foot. You should feel a stretch on the top of your toes and or foot.   Hold this stretch for __________ seconds.  Repeat __________ times. Complete this stretch __________ times per day.   RANGE OF MOTION - Ankle Dorsiflexion, Active Assisted  Remove shoes and sit on a chair that is preferably not on a carpeted surface.   Place right / left foot under knee. Extend your opposite leg for support.   Keeping your heel down, slide your right / left foot back toward the chair until you feel a stretch at your ankle or calf. If you do not feel a stretch, slide your bottom forward to the edge of the chair, while still keeping your heel down.   Hold this stretch for __________ seconds.  Repeat __________ times. Complete this stretch __________ times per day.   STRETCH  Gastroc, Standing  Place hands on wall.   Extend right / left leg, keeping the front knee somewhat bent.   Slightly point your toes inward on your back foot.   Keeping your right / left heel on the floor and your knee straight, shift your weight toward the wall, not allowing your back to arch.   You should feel a gentle stretch in the right / left calf. Hold this position for __________ seconds.  Repeat __________ times. Complete this stretch __________ times per day. STRETCH  Soleus, Standing  Place hands on wall.   Extend right / left leg, keeping the other knee somewhat bent.   Slightly point your toes inward on your back foot.   Keep your right / left heel on the floor, bend your back knee, and slightly shift your weight over the back leg so  that you feel a gentle stretch deep in your back calf.   Hold this position for __________ seconds.  Repeat __________ times. Complete this stretch __________ times per day. STRETCH  Gastrocsoleus, Standing  Note: This exercise can place a lot of stress on your foot and ankle. Please complete this exercise only if specifically instructed by your caregiver.    Place the ball of your right / left foot on a step, keeping your other foot firmly on the same step.   Hold on to the wall or a rail for balance.  Slowly lift your other foot, allowing your body weight to press your heel down over the edge of the step.   You should feel a stretch in your right / left calf.   Hold this position for __________ seconds.   Repeat this exercise with a slight bend in your right / left knee.  Repeat __________ times. Complete this stretch __________ times per day.   STRENGTHENING EXERCISES - Plantar Fasciitis (Heel Spur Syndrome)  These exercises may help you when beginning to rehabilitate your injury. They may resolve your symptoms with or without further involvement from your physician, physical therapist or athletic trainer. While completing these exercises, remember:    Muscles can gain both the endurance and the strength needed for everyday activities through controlled exercises.   Complete these exercises as instructed by your physician, physical therapist or athletic trainer. Progress the resistance and repetitions only as guided.  STRENGTH - Towel Curls  Sit in a chair positioned on a non-carpeted surface.   Place your foot on a towel, keeping your heel on the floor.   Pull the towel toward your heel by only curling your toes. Keep your heel on the floor.   If instructed by your physician, physical therapist or athletic trainer, add ____________________ at the end of the towel.  Repeat __________ times. Complete this exercise __________ times per day. STRENGTH - Ankle Inversion  Secure  one end of a rubber exercise band/tubing to a fixed object (table, pole). Loop the other end around your foot just before your toes.   Place your fists between your knees. This will focus your strengthening at your ankle.   Slowly, pull your big toe up and in, making sure the band/tubing is positioned to resist the entire motion.   Hold this position for __________ seconds.   Have your muscles resist the band/tubing as it slowly pulls your foot back to the starting position.  Repeat __________ times. Complete this exercises __________ times per day.   Document Released: 10/05/2005 Document Revised: 12/28/2011 Document Reviewed: 01/17/2009 [facility] Patient Information 2013 [place], [place].

## 2012-11-03 NOTE — Progress Notes (Signed)
Subjective:    Patient ID: [name], male    DOB: 05/08/1951, 62 y.o.   MRN: [id number]  HPI Mr. Luhman has had a month of bilateral leg pain from heel to posterior thigh, both legs. The longer he is on his feet the more it hurts. He has had no paresthesia, no weakness. The pain is dull in nature. No pain at rest except fore tenderness in the heel. No back pain. No claudication sensation. If he rests it will take 101-5 minutes to get fully active. LE arterial doppler Jan 14th revealed normal flow with positve ABI's.   Past Medical History  Diagnosis Date  . Coronary artery disease     s/p BMS to RCA 2002; LHC 2008: oLAD 40%, pRCA stent ok with 20%, EF 60%); Myoview scan in March 2011 which was negative for ischemia   . Hypertension   . Hyperlipidemia   . Bradycardia   . Carotid artery disease     Right CEA;  dopplers 6/12:  0-39% bilat ICA  . GERD (gastroesophageal reflux disease)   . Obstructive sleep apnea    Past Surgical History  Procedure Date  . Knee arthroscopy     right  . Carotid endarterectomy 06/26/2002    right  . Coronary angioplasty with stent placement 03/08/2000    Bare-metal stent in RCA, in [place]  . Cardiac catheterization 06/01/2007     EF of 65% -- Nonobstructive CAD with 40% ostial stenosis in the LAD, no significant obstruction in the circumflex artery, 20% narrowing within the stent in the proximal to mid right coronary and normal LV function  . Cardiac catheterization 02/02/2006    EF of 50%  . Cardiac catheterization 01/19/2002    EF of 60%  . Mole removal     Prior moles removed   Family History  Problem Relation Age of Onset  . Heart failure Father   . Peripheral vascular disease Mother     with pacemaker, and had a carotid endarterectomy  . Hypertension Brother   . Hypertension Brother    History   Social History  . Marital Status: Married    Spouse Name: N/A    Number of Children: N/A  . Years of Education: N/A   Occupational  History  . Not on file.   Social History Main Topics  . Smoking status: Former Smoker -- 1.0 packs/day for 30 years    Types: Cigarettes    Quit date: 10/19/2000  . Smokeless tobacco: Former Neurosurgeon    Types: Snuff  . Alcohol Use: Yes     Comment: Occasional beer  . Drug Use: No  . Sexually Active: Not on file   Other Topics Concern  . Not on file   Social History Narrative  . No narrative on file    Current Outpatient Prescriptions on File Prior to Visit  Medication Sig Dispense Refill  . amLODipine (NORVASC) 5 MG tablet Take 1 tablet (5 mg total) by mouth daily.  90 tablet  3  . aspirin 81 MG tablet Take 81 mg by mouth daily.        Marland Kitchen atenolol (TENORMIN) 50 MG tablet Take 1/2 tablet daily  45 tablet  3  . folic acid-pyridoxine-cyancobalamin (FOLBIC) 2.5-25-2 MG TABS Take 1 tablet by mouth daily.  90 each  3  . Folic Acid-Vit B6-Vit B12 0.4-50-0.1 MG TABS Take 1 tablet by mouth daily.  90 each  3  . hydrochlorothiazide (HYDRODIURIL) 50 MG tablet Take 1/2 tablet  daily  45 tablet  3  . isosorbide mononitrate (IMDUR) 30 MG 24 hr tablet Take one tablet by mouth daily.  90 tablet  3  . nitroGLYCERIN (NITROSTAT) 0.4 MG SL tablet Place 0.4 mg under the tongue every 5 (five) minutes as needed.      . pantoprazole (PROTONIX) 40 MG tablet Take 1 tablet (40 mg total) by mouth daily.  90 tablet  3  . PLAVIX 75 MG tablet TAKE ONE TABLET BY MOUTH EVERY DAY  30 tablet  5  . potassium chloride (K-DUR) 10 MEQ tablet Take 1 tablet (10 mEq total) by mouth daily.  90 tablet  3  . ramipril (ALTACE) 10 MG capsule Take 1 capsule (10 mg total) by mouth 2 (two) times daily.  180 capsule  3  . rosuvastatin (CRESTOR) 40 MG tablet Take 1 tablet (40 mg total) by mouth daily.  90 tablet  3      Review of Systems System review is negative for any constitutional, cardiac, pulmonary, GI or neuro symptoms or complaints other than as described in the HPI.     Objective:   Physical Exam Filed Vitals:    11/03/12 1527  BP: 148/88  Pulse: 76  Temp: 97.9 F (36.6 C)  Resp: 10   WNWD white man Cor- RR Pulm - normal respiration MSK - very tender at the heel to point pressure, tender at the achilles tendons extending up the calves.     Assessment & Plan:  Plantar fasciitis - (Heel spurs) is where the pain is originating from and causing collateral inflammation in the achilles tendons up the calve.  Plan  Over the counter (Dr. Oletta Cohn) heel cups or full length orthotics  Stretches (see hand out)  Use of Ibuprofen as you need it.

## 2012-11-05 ENCOUNTER — Other Ambulatory Visit: Payer: Self-pay | Admitting: Pulmonary Disease

## 2012-11-14 ENCOUNTER — Telehealth: Payer: Self-pay | Admitting: Cardiovascular Disease

## 2012-11-14 NOTE — Telephone Encounter (Signed)
Patient's wife called was told Dr.McAlhany advised to restart crestor at previous dose.

## 2012-11-14 NOTE — Telephone Encounter (Signed)
Spoke to patient's wife she stated patient saw Dr.Norrins and was diagnosed with heel spurs,wants to know if patient should restart crestor.Mesage sent to Dr.McAlhany.

## 2012-11-14 NOTE — Telephone Encounter (Signed)
He should restart Crestor at previous dose. Thanks, chris

## 2012-11-14 NOTE — Telephone Encounter (Signed)
New Problem:    Patient's wife called in because Dr. Scarlett Presto diagnosed what was going on with his leg issues and wanted to know if he would be able to go back on his Crestor.  Please call back.

## 2012-11-30 ENCOUNTER — Telehealth: Payer: Self-pay | Admitting: Internal Medicine

## 2012-11-30 NOTE — Telephone Encounter (Signed)
Pt's wife called to say [name]'s heel is worse.  He has been wearing the heel inserts.  They want to know what to do next?  Injection? Referral? Her number is [phone].

## 2012-11-30 NOTE — Telephone Encounter (Signed)
Appointment for heel injection

## 2012-12-05 NOTE — Telephone Encounter (Signed)
Pt has an appt for Feb. 24.

## 2012-12-08 ENCOUNTER — Telehealth: Payer: Self-pay | Admitting: *Deleted

## 2012-12-08 NOTE — Telephone Encounter (Signed)
For acute anxiety related to procedure may take alprazolam 0.5 mg q 8 hours. #12  For heel pain it is ok to take aleve 1-2 tabs twice a day. Watch for belly pain.

## 2012-12-08 NOTE — Telephone Encounter (Signed)
Pt's wife called stating pt very anxious about his procedure on Monday and would like something to help with it. Pt was hurting severely at night and is afraid to take anything for the pain. Please advise.

## 2012-12-09 MED ORDER — ALPRAZOLAM 0.5 MG PO TBDP: 0.5000 mg | ORAL_TABLET | Freq: Three times a day (TID) | ORAL | Status: DC | PRN

## 2012-12-09 NOTE — Telephone Encounter (Signed)
Phoned in rx for alprazolam 0.5 mg q 8 hrs as needed for anxierty. #12; 0 refills. Called and LVM on wife's phone letting her know I called in the rx to [facility] in [facility], and for heel pain it is ok to take aleve 1-2 tabs twice a day. Watch for belly pain.

## 2012-12-12 ENCOUNTER — Ambulatory Visit (INDEPENDENT_AMBULATORY_CARE_PROVIDER_SITE_OTHER): Payer: 59 | Admitting: Internal Medicine

## 2012-12-12 VITALS — BP 138/80 | HR 63 | Temp 97.8°F | Resp 16 | Wt 198.2 lb

## 2012-12-12 DIAGNOSIS — M722 Plantar fascial fibromatosis: Secondary | ICD-10-CM

## 2012-12-12 MED ORDER — METHYLPREDNISOLONE ACETATE 80 MG/ML IJ SUSP
80.0000 mg | Freq: Once | INTRAMUSCULAR | Status: AC
  Administered 2012-12-12: 80 mg via INTRAMUSCULAR

## 2012-12-12 NOTE — Progress Notes (Signed)
  Subjective:    Patient ID: [name], male    DOB: 1951-01-21, 62 y.o.   MRN: [id number]  HPI [name] presents with persistent plantar fasciitis both feet much worse on the right. He has tried conservative treatment w/o success and is willing to have steroid injection.  PMH, FamHx and SocHx reviewed for any changes and relevance. Current Outpatient Prescriptions on File Prior to Visit  Medication Sig Dispense Refill  . ALPRAZolam (NIRAVAM) 0.5 MG dissolvable tablet Take 1 tablet (0.5 mg total) by mouth every 8 (eight) hours as needed for anxiety.  14 tablet  0  . amLODipine (NORVASC) 5 MG tablet Take 1 tablet (5 mg total) by mouth daily.  90 tablet  3  . aspirin 81 MG tablet Take 81 mg by mouth daily.        Marland Kitchen atenolol (TENORMIN) 50 MG tablet Take 1/2 tablet daily  45 tablet  3  . folic acid-pyridoxine-cyancobalamin (FOLBIC) 2.5-25-2 MG TABS Take 1 tablet by mouth daily.  90 each  3  . Folic Acid-Vit B6-Vit B12 0.4-50-0.1 MG TABS Take 1 tablet by mouth daily.  90 each  3  . hydrochlorothiazide (HYDRODIURIL) 50 MG tablet Take 1/2 tablet daily  45 tablet  3  . isosorbide mononitrate (IMDUR) 30 MG 24 hr tablet Take one tablet by mouth daily.  90 tablet  3  . nitroGLYCERIN (NITROSTAT) 0.4 MG SL tablet Place 0.4 mg under the tongue every 5 (five) minutes as needed.      . pantoprazole (PROTONIX) 40 MG tablet Take 1 tablet (40 mg total) by mouth daily.  90 tablet  3  . PLAVIX 75 MG tablet TAKE ONE TABLET BY MOUTH EVERY DAY  30 tablet  5  . potassium chloride (K-DUR) 10 MEQ tablet Take 1 tablet (10 mEq total) by mouth daily.  90 tablet  3  . ramipril (ALTACE) 10 MG capsule Take 1 capsule (10 mg total) by mouth 2 (two) times daily.  180 capsule  3  . rosuvastatin (CRESTOR) 40 MG tablet Take 1 tablet (40 mg total) by mouth daily.  90 tablet  3   No current facility-administered medications on file prior to visit.      Review of Systems System review is negative for any constitutional,  cardiac, pulmonary, GI or neuro symptoms or complaints other than as described in the HPI.     Objective:   Physical Exam Filed Vitals:   12/12/12 0933  BP: 138/80  Pulse: 63  Temp: 97.8 F (36.6 C)  Resp: 16   Gen'l WNWD - white man in no distress msk - point tenderness over the calcaneus right foot.  Procedure heel injection  Indication - localized pain Consent - informed verbal consent from patient after explanation of risks of bleeding and infection Prep - injection site identified, prepped with betadine followed by alcohol. Med -    80  Mg depomedrol with  0.5  Cc 2% xylocain w/ epi Injection -. Injected to bone without difficulty. Patient tolerated this well. Post-procedure - patient with rapid reduction in discomfort. Bandaid applied. Routine precautions provided including instruction to return for fever, drainage or increased pain        Assessment & Plan:

## 2012-12-12 NOTE — Patient Instructions (Addendum)
Plantar fasciitis - worse on the right. Injection today will hopefull provide durable relief. If not the next step is to see an orthopedic surgeon. Do continue stretches and support shoes or inserts.  Call for any soreness, redness or other signs of inflammation.

## 2012-12-13 DIAGNOSIS — M722 Plantar fascial fibromatosis: Secondary | ICD-10-CM

## 2012-12-13 HISTORY — DX: Plantar fascial fibromatosis: M72.2

## 2012-12-13 NOTE — Assessment & Plan Note (Signed)
Bilateral plantar fasciitis, worse on the right. He has been doing stretches and has got inserts for his shoes. He had a good response to depomedrol injection right heel at today's visit.  Plan Continue stretches and inserts  For recurrent severe right heel pain will refer to ortho.

## 2012-12-15 ENCOUNTER — Telehealth: Payer: Self-pay | Admitting: Internal Medicine

## 2012-12-15 ENCOUNTER — Telehealth: Payer: Self-pay | Admitting: *Deleted

## 2012-12-15 DIAGNOSIS — M722 Plantar fascial fibromatosis: Secondary | ICD-10-CM

## 2012-12-15 NOTE — Telephone Encounter (Signed)
Patient daughter called, Maryan Rued, stated her dad had injection on Monday for plantar fasciitis. States he still can not put any weight on his foot. Please advise. Daughter call back # cell [phone]

## 2012-12-15 NOTE — Telephone Encounter (Signed)
referral to Dr. Darrick Penna and his fellows at the sport medicine program at [facility]. [facility] notified

## 2012-12-15 NOTE — Telephone Encounter (Signed)
PATIENT DAUGHTER AWARE OF REFERRAL SENT FOR HER DAD TO DR. FIELDS. [facility] WILL  NOTIFY OF DATE AND TIME OF APPT.

## 2012-12-15 NOTE — Telephone Encounter (Signed)
Pt can't put weight on foot after heel injection.

## 2012-12-15 NOTE — Telephone Encounter (Signed)
See phone not to Hoy Register. Pt referred to Dr. Roanna Epley

## 2012-12-16 ENCOUNTER — Ambulatory Visit: Payer: 59 | Admitting: Cardiovascular Disease

## 2012-12-22 ENCOUNTER — Ambulatory Visit: Payer: 59 | Admitting: Sports Medicine

## 2013-01-24 ENCOUNTER — Other Ambulatory Visit: Payer: Self-pay | Admitting: *Deleted

## 2013-01-24 DIAGNOSIS — I10 Essential (primary) hypertension: Secondary | ICD-10-CM

## 2013-01-24 DIAGNOSIS — I2510 Atherosclerotic heart disease of native coronary artery without angina pectoris: Secondary | ICD-10-CM

## 2013-01-24 MED ORDER — RAMIPRIL 10 MG PO CAPS: 10.0000 mg | ORAL_CAPSULE | Freq: Two times a day (BID) | ORAL | Status: DC

## 2013-01-24 MED ORDER — POTASSIUM CHLORIDE ER 10 MEQ PO TBCR: 10.0000 meq | EXTENDED_RELEASE_TABLET | Freq: Every day | ORAL | Status: DC

## 2013-01-24 MED ORDER — ATENOLOL 50 MG PO TABS: ORAL_TABLET | ORAL | Status: DC

## 2013-01-24 MED ORDER — ISOSORBIDE MONONITRATE ER 30 MG PO TB24: ORAL_TABLET | ORAL | Status: DC

## 2013-01-24 MED ORDER — HYDROCHLOROTHIAZIDE 50 MG PO TABS: ORAL_TABLET | ORAL | Status: DC

## 2013-01-24 MED ORDER — AMLODIPINE BESYLATE 5 MG PO TABS: 5.0000 mg | ORAL_TABLET | Freq: Every day | ORAL | Status: DC

## 2013-01-24 NOTE — Telephone Encounter (Signed)
refill to [facility] for HCTZ, Tenormin, K+

## 2013-01-24 NOTE — Telephone Encounter (Signed)
Refills sent in today to [facility]

## 2013-02-07 ENCOUNTER — Other Ambulatory Visit: Payer: Self-pay

## 2013-02-07 MED ORDER — PANTOPRAZOLE SODIUM 40 MG PO TBEC: 40.0000 mg | DELAYED_RELEASE_TABLET | Freq: Every day | ORAL | Status: DC

## 2013-02-21 ENCOUNTER — Other Ambulatory Visit: Payer: Self-pay | Admitting: *Deleted

## 2013-02-21 DIAGNOSIS — I2510 Atherosclerotic heart disease of native coronary artery without angina pectoris: Secondary | ICD-10-CM

## 2013-02-21 DIAGNOSIS — I10 Essential (primary) hypertension: Secondary | ICD-10-CM

## 2013-02-21 MED ORDER — CLOPIDOGREL BISULFATE 75 MG PO TABS: 75.0000 mg | ORAL_TABLET | Freq: Every day | ORAL | Status: DC

## 2013-02-21 MED ORDER — ATENOLOL 50 MG PO TABS: ORAL_TABLET | ORAL | Status: DC

## 2013-02-21 MED ORDER — HYDROCHLOROTHIAZIDE 50 MG PO TABS: ORAL_TABLET | ORAL | Status: DC

## 2013-03-01 ENCOUNTER — Other Ambulatory Visit: Payer: Self-pay

## 2013-03-01 MED ORDER — ROSUVASTATIN CALCIUM 40 MG PO TABS: 40.0000 mg | ORAL_TABLET | Freq: Every day | ORAL | Status: DC

## 2013-03-03 ENCOUNTER — Other Ambulatory Visit: Payer: Self-pay | Admitting: *Deleted

## 2013-03-03 DIAGNOSIS — I2510 Atherosclerotic heart disease of native coronary artery without angina pectoris: Secondary | ICD-10-CM

## 2013-03-03 MED ORDER — AMLODIPINE BESYLATE 5 MG PO TABS: 5.0000 mg | ORAL_TABLET | Freq: Every day | ORAL | Status: DC

## 2013-03-03 MED ORDER — ISOSORBIDE MONONITRATE ER 30 MG PO TB24: ORAL_TABLET | ORAL | Status: DC

## 2013-03-03 MED ORDER — RAMIPRIL 10 MG PO CAPS: 10.0000 mg | ORAL_CAPSULE | Freq: Two times a day (BID) | ORAL | Status: DC

## 2013-03-03 MED ORDER — ROSUVASTATIN CALCIUM 40 MG PO TABS: 40.0000 mg | ORAL_TABLET | Freq: Every day | ORAL | Status: DC

## 2013-04-26 ENCOUNTER — Other Ambulatory Visit: Payer: Self-pay

## 2013-04-26 DIAGNOSIS — I2510 Atherosclerotic heart disease of native coronary artery without angina pectoris: Secondary | ICD-10-CM

## 2013-04-26 MED ORDER — ATENOLOL 50 MG PO TABS: ORAL_TABLET | ORAL | Status: DC

## 2013-05-30 ENCOUNTER — Other Ambulatory Visit: Payer: Self-pay

## 2013-05-30 MED ORDER — POTASSIUM CHLORIDE ER 10 MEQ PO TBCR: 10.0000 meq | EXTENDED_RELEASE_TABLET | Freq: Every day | ORAL | Status: DC

## 2013-06-02 ENCOUNTER — Other Ambulatory Visit: Payer: Self-pay | Admitting: *Deleted

## 2013-06-14 ENCOUNTER — Telehealth: Payer: Self-pay | Admitting: Physician Assistant

## 2013-06-14 MED ORDER — POTASSIUM CHLORIDE ER 10 MEQ PO TBCR: 10.0000 meq | EXTENDED_RELEASE_TABLET | Freq: Every day | ORAL | Status: DC

## 2013-06-14 NOTE — Telephone Encounter (Signed)
Refill

## 2013-07-15 ENCOUNTER — Other Ambulatory Visit: Payer: Self-pay | Admitting: Cardiovascular Disease

## 2013-07-21 ENCOUNTER — Ambulatory Visit (INDEPENDENT_AMBULATORY_CARE_PROVIDER_SITE_OTHER): Payer: 59 | Admitting: Cardiovascular Disease

## 2013-07-21 ENCOUNTER — Encounter: Payer: Self-pay | Admitting: Cardiovascular Disease

## 2013-07-21 VITALS — BP 158/96 | HR 62 | Ht 73.0 in | Wt 197.0 lb

## 2013-07-21 DIAGNOSIS — E785 Hyperlipidemia, unspecified: Secondary | ICD-10-CM

## 2013-07-21 DIAGNOSIS — I6529 Occlusion and stenosis of unspecified carotid artery: Secondary | ICD-10-CM

## 2013-07-21 DIAGNOSIS — I2510 Atherosclerotic heart disease of native coronary artery without angina pectoris: Secondary | ICD-10-CM

## 2013-07-21 DIAGNOSIS — I10 Essential (primary) hypertension: Secondary | ICD-10-CM

## 2013-07-21 MED ORDER — AMLODIPINE BESYLATE 10 MG PO TABS: 10.0000 mg | ORAL_TABLET | Freq: Every day | ORAL | Status: DC

## 2013-07-21 NOTE — Patient Instructions (Addendum)
Your physician wants you to follow-up in: 12 months. You will receive a reminder letter in the mail two months in advance. If you don't receive a letter, please call our office to schedule the follow-up appointment.  Your physician has recommended you make the following change in your medication: Increase amlodipine to 10 mg by mouth daily

## 2013-07-21 NOTE — Progress Notes (Signed)
History of Present Illness: 62 yo WM with history of CAD, HTN, hyperlipidemia, carotid artery disease s/p CEA who is here today for cardiac follow up. He has been followed in the past by Dr. Juanda Chance. In 2002 he had a bare metal stent placed to the right coronary artery. A catheterization in 2008 showed non-obstructive disease. Had a Myoview scan in March 2011 which was negative for ischemia. He had duplex studies of his carotid in June 2012 with mild disease and stable right CEA site.   He is here today for follow up. He has had no chest pain or SOB.    Primary Care Physician: Illene Regulus  Last Lipid Profile:Lipid Panel     Component Value Date/Time   CHOL 158 07/01/2012 0943   TRIG 135.0 07/01/2012 0943   HDL 27.60* 07/01/2012 0943   CHOLHDL 6 07/01/2012 0943   VLDL 27.0 07/01/2012 0943   LDLCALC 103* 07/01/2012 0943     Past Medical History  Diagnosis Date  . Coronary artery disease     s/p BMS to RCA 2002; LHC 2008: oLAD 40%, pRCA stent ok with 20%, EF 60%); Myoview scan in March 2011 which was negative for ischemia   . Hypertension   . Hyperlipidemia   . Bradycardia   . Carotid artery disease     Right CEA;  dopplers 6/12:  0-39% bilat ICA  . GERD (gastroesophageal reflux disease)   . Obstructive sleep apnea     Past Surgical History  Procedure Laterality Date  . Knee arthroscopy      right  . Carotid endarterectomy  06/26/2002    right  . Coronary angioplasty with stent placement  03/08/2000    Bare-metal stent in RCA, in [place]  . Cardiac catheterization  06/01/2007     EF of 65% -- Nonobstructive CAD with 40% ostial stenosis in the LAD, no significant obstruction in the circumflex artery, 20% narrowing within the stent in the proximal to mid right coronary and normal LV function  . Cardiac catheterization  02/02/2006    EF of 50%  . Cardiac catheterization  01/19/2002    EF of 60%  . Mole removal      Prior moles removed    Current Outpatient  Prescriptions  Medication Sig Dispense Refill  . ALPRAZolam (NIRAVAM) 0.5 MG dissolvable tablet Take 1 tablet (0.5 mg total) by mouth every 8 (eight) hours as needed for anxiety.  14 tablet  0  . amLODipine (NORVASC) 5 MG tablet Take 1 tablet (5 mg total) by mouth daily.  90 tablet  3  . aspirin 81 MG tablet Take 81 mg by mouth daily.        Marland Kitchen atenolol (TENORMIN) 50 MG tablet Take one-half tablet by  mouth every day  90 tablet  0  . clopidogrel (PLAVIX) 75 MG tablet Take 1 tablet (75 mg total) by mouth daily.  90 tablet  2  . FOLBIC 2.5-25-2 MG TABS Take 1 tablet by mouth  daily  90 each  0  . hydrochlorothiazide (HYDRODIURIL) 50 MG tablet Take 1/2 tablet daily  90 tablet  1  . isosorbide mononitrate (IMDUR) 30 MG 24 hr tablet Take one tablet by mouth daily.  90 tablet  3  . nitroGLYCERIN (NITROSTAT) 0.4 MG SL tablet Place 0.4 mg under the tongue every 5 (five) minutes as needed.      . pantoprazole (PROTONIX) 40 MG tablet Take 1 tablet (40 mg total) by mouth daily.  90 tablet  1  . potassium chloride (K-DUR) 10 MEQ tablet Take 1 tablet (10 mEq total) by mouth daily.  90 tablet  1  . ramipril (ALTACE) 10 MG capsule Take 1 capsule (10 mg total) by mouth 2 (two) times daily.  180 capsule  3  . rosuvastatin (CRESTOR) 40 MG tablet Take 1 tablet (40 mg total) by mouth daily.  90 tablet  3   No current facility-administered medications for this visit.    Allergies  Allergen Reactions  . Felodipine Swelling  . Niacin     REACTION: not tolerated.    History   Social History  . Marital Status: Married    Spouse Name: N/A    Number of Children: N/A  . Years of Education: N/A   Occupational History  . Not on file.   Social History Main Topics  . Smoking status: Former Smoker -- 1.00 packs/day for 30 years    Types: Cigarettes    Quit date: 10/19/2000  . Smokeless tobacco: Former Neurosurgeon    Types: Snuff  . Alcohol Use: Yes     Comment: Occasional beer  . Drug Use: No  . Sexual  Activity: Not on file   Other Topics Concern  . Not on file   Social History Narrative  . No narrative on file    Family History  Problem Relation Age of Onset  . Heart failure Father   . Peripheral vascular disease Mother     with pacemaker, and had a carotid endarterectomy  . Hypertension Brother   . Hypertension Brother     Review of Systems:  As stated in the HPI and otherwise negative.   BP 158/96  Pulse 62  Ht 6\' 1"  (1.854 m)  Wt 197 lb (89.359 kg)  BMI 26 kg/m2  Physical Examination: General: Well developed, well nourished, NAD HEENT: OP clear, mucus membranes moist SKIN: warm, dry. No rashes. Neuro: No focal deficits Musculoskeletal: Muscle strength 5/5 all ext Psychiatric: Mood and affect normal Neck: No JVD, no carotid bruits, no thyromegaly, no lymphadenopathy. Lungs:Clear bilaterally, no wheezes, rhonci, crackles Cardiovascular: Regular rate and rhythm. No murmurs, gallops or rubs. Abdomen:Soft. Bowel sounds present. Non-tender.  Extremities: No lower extremity edema. Pulses are 2 + in the bilateral DP/PT.  EKG:NSR, rate 62 bpm.   Assessment and Plan:   1. Coronary Artery Disease: Doing well. No angina. Continue current therapy.  2. Hyperlipidemia:  Continue statin.   3. Hypertension: Elevated. Will increase Norvasc to 10 mg po Qdaily.   4. Carotid Artery Disease: Next dopplers due at f/u visit.   5. Bilateral leg pain: Resolved. ABI normal 2014.

## 2013-07-26 ENCOUNTER — Other Ambulatory Visit: Payer: Self-pay

## 2013-07-26 DIAGNOSIS — I2510 Atherosclerotic heart disease of native coronary artery without angina pectoris: Secondary | ICD-10-CM

## 2013-07-26 MED ORDER — AMLODIPINE BESYLATE 10 MG PO TABS: 10.0000 mg | ORAL_TABLET | Freq: Every day | ORAL | Status: DC

## 2013-08-28 ENCOUNTER — Telehealth: Payer: Self-pay | Admitting: Internal Medicine

## 2013-08-28 NOTE — Telephone Encounter (Signed)
LM with [name]. Pt has not been seen since 06/2012 and doesn't have a pending appointment. We will need to see him before we could send in a new order for supplies.

## 2013-09-08 ENCOUNTER — Other Ambulatory Visit: Payer: Self-pay | Admitting: Cardiovascular Disease

## 2013-09-18 ENCOUNTER — Other Ambulatory Visit: Payer: Self-pay | Admitting: Cardiovascular Disease

## 2013-09-22 ENCOUNTER — Telehealth: Payer: Self-pay | Admitting: Cardiovascular Disease

## 2013-09-22 NOTE — Telephone Encounter (Signed)
Wife called stating that her husband's primary care dr was going to give him a medication for his Erectile Dysfunction.  Advised that he is taking Isosorbide and can not take any ED meds because of that medication.  She wants to know if he can stop Isosorbide.  Advised Dr. Sanjuana Kava is not in office until Tues 12/9.  Advised her that I would forward message to him for him to decide and someone will call her back.

## 2013-09-22 NOTE — Telephone Encounter (Signed)
LMTCB 11:04 am/PE

## 2013-09-22 NOTE — Telephone Encounter (Signed)
New Problem:  Pt's wife is calling in reference to her husband's penis. Pt states she thinks one of his meds is causing erectile dysfunction. Pt's wife is requesting a call back.

## 2013-09-25 ENCOUNTER — Other Ambulatory Visit: Payer: Self-pay | Admitting: Cardiovascular Disease

## 2013-09-25 NOTE — Telephone Encounter (Signed)
It will be ok to stop his Imdur. Thayer Ohm

## 2013-09-25 NOTE — Telephone Encounter (Signed)
Left message to call back  

## 2013-09-26 NOTE — Telephone Encounter (Signed)
Left message to call back  

## 2013-09-26 NOTE — Telephone Encounter (Signed)
Pt's wife notified.

## 2013-10-23 ENCOUNTER — Other Ambulatory Visit: Payer: Self-pay | Admitting: Cardiovascular Disease

## 2013-10-27 ENCOUNTER — Other Ambulatory Visit: Payer: Self-pay | Admitting: Cardiovascular Disease

## 2013-10-27 ENCOUNTER — Other Ambulatory Visit: Payer: Self-pay

## 2013-10-27 MED ORDER — RAMIPRIL 10 MG PO CAPS: 10.0000 mg | ORAL_CAPSULE | Freq: Two times a day (BID) | ORAL | Status: DC

## 2013-11-14 ENCOUNTER — Telehealth: Payer: Self-pay | Admitting: Internal Medicine

## 2013-11-14 NOTE — Telephone Encounter (Signed)
Patient is coming in for an injection this Friday with Dr. Tamala Julian and is wanting to know if he can be given something before the injection to help ease the pain he gets after the injection. Patient says last time he received the injection he was in very severe pain. Please advise.

## 2013-11-15 ENCOUNTER — Telehealth: Payer: Self-pay | Admitting: *Deleted

## 2013-11-15 NOTE — Telephone Encounter (Signed)
I have no knowledge of this appointment, why it was made and what type of injection he will be getting.  If he will be seeing Dr. Tamala Julian - please direct this question to him as to what type of pre-medication he would recommend.

## 2013-11-15 NOTE — Telephone Encounter (Signed)
Left message on VM, medication is not prescribed prior to evaluation.

## 2013-11-15 NOTE — Telephone Encounter (Addendum)
Patient's wife left vm requesting medication. Patient has appt 11/17/13 w/Dr. Tamala Julian for injection in his foot. Patient would like something for his foot pain to take before he comes in for appt. Please advise?

## 2013-11-16 NOTE — Telephone Encounter (Signed)
Spoke with pts wife advised of MDs message. 

## 2013-11-16 NOTE — Telephone Encounter (Signed)
I have not seen patient and do not know what he is talking about.  I do not know if I will do an injection or not.  I would not premedicate with anything stronger then ibuprofen or tylenol and even then I would like him not to take it because I would like to see how bad the pain is.  Nitchia can you call and relay this when you have time?

## 2013-11-17 ENCOUNTER — Ambulatory Visit (INDEPENDENT_AMBULATORY_CARE_PROVIDER_SITE_OTHER): Payer: 59 | Admitting: Family Medicine

## 2013-11-17 ENCOUNTER — Encounter: Payer: Self-pay | Admitting: Family Medicine

## 2013-11-17 ENCOUNTER — Other Ambulatory Visit (INDEPENDENT_AMBULATORY_CARE_PROVIDER_SITE_OTHER): Payer: 59

## 2013-11-17 VITALS — BP 138/80 | HR 59 | Temp 97.3°F | Resp 16 | Wt 201.0 lb

## 2013-11-17 DIAGNOSIS — M722 Plantar fascial fibromatosis: Secondary | ICD-10-CM

## 2013-11-17 MED ORDER — MELOXICAM 15 MG PO TABS: 15.0000 mg | ORAL_TABLET | Freq: Every day | ORAL | Status: DC

## 2013-11-17 NOTE — Progress Notes (Signed)
Pre-visit discussion using our clinic review tool. No additional management support is needed unless otherwise documented below in the visit note.  

## 2013-11-17 NOTE — Patient Instructions (Signed)
Very nice to meet you Do the exercises daily Please read handouts on Plantar Fascitis.  STRETCHING and Strengthening program critically important.  Strengthening on foot and calf muscles as seen in handout. Calf raises, 2 legged, on step, drop heels then go up on toes hold for 2 seconds, then down slow for a count of 4, 1 set of 30 daily for 1 week, then 2 sets daily for 1 week, then 3 sets daily therafter.  Foot massage with tennis ball. Ice bath 20 minutes 2 times daily.  Look into [place] splint at night for your foot.   Towel Scrunches: get a towel or hand towel, use toes to pick up and scrunch up the towel.  Marble pick-ups, practice picking up marbles with toes and placing into a cup  NEEDS TO BE DONE EVERY DAY  Recommended over the counter insoles. ([employer] orthotics at [employer] or dicks.   A rigid shoe with good arch support helps: Dansko (great), Bronwen Betters No easily bendable shoes.   Tuli's heel cups Meloxicam daily for 10 days then as needed Come back again in 3-4 weeks.

## 2013-11-17 NOTE — Assessment & Plan Note (Signed)
Plantar Fascitis: We reviewed that stretching is critically important to the treatment of PF. Reviewed footwear. Rigid soles have been shown to help with PF. Night splints can help. Reviewed rehab of stretching and calf raises.  Could benefit from a corticosteroid injection, orthotics, or other measures if conservative treatment fails. Meloxicam given for 10 days and discussed icing When patient comes back again in 3-4 weeks if still in pain I would consider another injection versus custom orthotics.

## 2013-11-17 NOTE — Progress Notes (Signed)
  I'm seeing this patient by the request  of:  Adella Hare, MD   CC: Right foot pain  HPI: Patient is a very pleasant 63 year old gentleman who has had plantar fasciitis he states for over one year. Patient did have an injection in February of 2014 which did resolve some of his pain for approximately 4 months. Since that time and is insidiously started to come back. Patient states that his pain is mostly on the medial aspect of the posterior heel it is worse in the morning and after sitting for a long period of time. Patient describes it as a sharp tearing sensation it does get better with ambulation but takes 10-20 minutes. Patient denies any numbness or tingling denies any swelling that he can no. Patient has been trying ibuprofen with no significant improvement. Patient does walk a significant amount of work and states that this is not getting in the way of work but he is scared to take a break secondary to the pain. Patient does have a long trip coming up and is scared of the car ride and getting out of the car after sitting for a long amount time. Severity 8/10.  Past medical, surgical, family and social history reviewed. Medications reviewed all in the electronic medical record.   Review of Systems: No headache, visual changes, nausea, vomiting, diarrhea, constipation, dizziness, abdominal pain, skin rash, fevers, chills, night sweats, weight loss, swollen lymph nodes, body aches, joint swelling, muscle aches, chest pain, shortness of breath, mood changes.   Objective:    Blood pressure 138/80, pulse 59, temperature 97.3 F (36.3 C), temperature source Oral, resp. rate 16, weight 201 lb (91.173 kg), SpO2 97.00%.   General: No apparent distress alert and oriented x3 mood and affect normal, dressed appropriately.  HEENT: Pupils equal, extraocular movements intact Respiratory: Patient's speak in full sentences and does not appear short of breath Cardiovascular: No lower extremity edema, non  tender, no erythema Skin: Warm dry intact with no signs of infection or rash on extremities or on axial skeleton. Abdomen: Soft nontender Neuro: Cranial nerves II through XII are intact, neurovascularly intact in all extremities with 2+ DTRs and 2+ pulses. Lymph: No lymphadenopathy of posterior or anterior cervical chain or axillae bilaterally.  Gait normal with good balance and coordination.  MSK: Non tender with full range of motion and good stability and symmetric strength and tone of shoulders, elbows, wrist, hip, knee and ankles bilaterally.   right foot exam: Normal inspection with no visable or palpable fat pad atrophy and no visible swelling/erythema. Patient is tender at medial insertion of plantar fascia into calcaneus. Great toe motion: Mild hallux rigidus Arch shape: Patient is having breakdown of the longitudinal arch Other foot breakdown: Mild breakdown of the transverse arch  Limited musculoskeletal ultrasound was performed and interpreted by Hulan Saas, M today. Ultrasound shows a patient's plantar fasciitis does have significant hypoechoic changes in does measure 1.49 cm. No true tear appreciated. Very small bone spur noted. Otherwise unremarkable scan Impression: Plantar fasciitis  Impression and Recommendations:     This case required medical decision making of moderate complexity.

## 2013-11-20 ENCOUNTER — Telehealth: Payer: Self-pay | Admitting: *Deleted

## 2013-11-20 NOTE — Telephone Encounter (Signed)
[facility] Triage Call Report Triage Record Num: [id number] Operator: Claudine Mouton Patient Name: [name] Call Date & Time: 11/17/2013 5:57:11PM Patient Phone: [phone] PCP: Charlann Boxer Patient Gender: Male PCP Fax : [phone] Patient DOB: 10-21-1950 Practice Name: [facility] Reason for Call: Caller: Osvaldo Angst; PCP: Hulan Saas; CB#: [phone]; Call regarding Medication Issue; Medication(s): meloxicam; EMR accessed and Meloxicam 15mg  tab 1 daily #30 NR on chart for OV today. It appears that rx was sent to mail order pharm and pt does not want it from there he needs it at local pharm. Rx called in to [facility] Protocol(s) Used: Medication Questions - Adult Recommended Outcome per Protocol: Provided Health Information Reason for Outcome: Caller has medication question(s) that was answered with available resources Care Advice: ~ 01/

## 2013-12-01 ENCOUNTER — Telehealth: Payer: Self-pay | Admitting: *Deleted

## 2013-12-01 NOTE — Telephone Encounter (Signed)
Spouse phoned stating that the patient had a reaction to the mobic prescribed 11/17/13-BLE 1+pitting edema, painless.  States he quit taking the meds and is asking if an alternate med can be prescribed or is it safe to continue with original?  Please advise.  Also needs to cancel appt on 12/08/13.   CB# [phone]

## 2013-12-04 NOTE — Telephone Encounter (Signed)
Understand canceling.  Would try Ibuprofen 600mg  three times daily instead for 5 days straight.   Do NOT continue the mobic. Will be happy to see patient whenever is a good time for patient.

## 2013-12-04 NOTE — Telephone Encounter (Signed)
Spoke with pts wife advised of MDs message.

## 2013-12-08 ENCOUNTER — Ambulatory Visit: Payer: 59 | Admitting: Family Medicine

## 2014-02-13 ENCOUNTER — Other Ambulatory Visit: Payer: Self-pay | Admitting: Cardiovascular Disease

## 2014-02-16 ENCOUNTER — Other Ambulatory Visit: Payer: Self-pay | Admitting: Cardiovascular Disease

## 2014-05-02 ENCOUNTER — Other Ambulatory Visit: Payer: Self-pay | Admitting: Cardiovascular Disease

## 2014-05-26 ENCOUNTER — Other Ambulatory Visit: Payer: Self-pay | Admitting: Cardiovascular Disease

## 2014-05-26 ENCOUNTER — Other Ambulatory Visit: Payer: Self-pay | Admitting: Family Medicine

## 2014-05-28 NOTE — Telephone Encounter (Signed)
Refill done.  

## 2014-06-01 ENCOUNTER — Other Ambulatory Visit: Payer: Self-pay

## 2014-06-01 MED ORDER — POTASSIUM CHLORIDE ER 10 MEQ PO TBCR: 10.0000 meq | EXTENDED_RELEASE_TABLET | Freq: Every day | ORAL | Status: DC

## 2014-06-10 ENCOUNTER — Other Ambulatory Visit: Payer: Self-pay | Admitting: Cardiovascular Disease

## 2014-06-18 ENCOUNTER — Telehealth: Payer: Self-pay

## 2014-06-18 ENCOUNTER — Telehealth: Payer: Self-pay | Admitting: Cardiovascular Disease

## 2014-06-18 ENCOUNTER — Telehealth: Payer: Self-pay | Admitting: *Deleted

## 2014-06-18 MED ORDER — ISOSORBIDE MONONITRATE ER 30 MG PO TB24
30.0000 mg | ORAL_TABLET | Freq: Every day | ORAL | Status: DC
Start: 2014-06-18 — End: 2015-01-27

## 2014-06-18 NOTE — Telephone Encounter (Deleted)
Error

## 2014-06-18 NOTE — Telephone Encounter (Signed)
Received rx request from pt's pharmacy for meloxicam 15 mg po daily.  You annotated on rx that if pt still had pain he needed to be seen.  I attempted to call pt to see if he still had pain, left message on voicemail.  Do you want to refill this med? Please advise

## 2014-06-18 NOTE — Telephone Encounter (Signed)
Is this patient supposed to be taking imdur. I see where he was told ok to stop it in December 2014, but the wife called to see why it was not refilled. Please advise. Thanks, MI

## 2014-06-18 NOTE — Telephone Encounter (Signed)
Patient states he has plenty left bc he does not take often.  Patient is not sure why pharmacy sent request.  [place].  He states he will make an appt to come back in once he is in town.

## 2014-06-18 NOTE — Telephone Encounter (Signed)
I think I would like to see him just to touch base.  Thank you

## 2014-06-18 NOTE — Telephone Encounter (Signed)
Dr. Angelena Form had given OK for pt to stop Imdur so ED medication could be started. I spoke with pt. He has continued to take Imdur 30 mg daily and has never taken medication for ED. He has no plans to take ED medication and is aware this medication cannot be prescribed while taking Imdur.  Will send refill for Imdur to pt's mail order.

## 2014-06-18 NOTE — Telephone Encounter (Signed)
Left message on pt's cell phone and wife's cell phone to call office.

## 2014-07-31 ENCOUNTER — Other Ambulatory Visit: Payer: Self-pay | Admitting: Cardiovascular Disease

## 2014-07-31 NOTE — Telephone Encounter (Signed)
error 

## 2014-09-10 ENCOUNTER — Ambulatory Visit (INDEPENDENT_AMBULATORY_CARE_PROVIDER_SITE_OTHER): Payer: 59 | Admitting: Cardiovascular Disease

## 2014-09-10 ENCOUNTER — Encounter: Payer: Self-pay | Admitting: Cardiovascular Disease

## 2014-09-10 ENCOUNTER — Ambulatory Visit (HOSPITAL_COMMUNITY): Payer: 59 | Attending: Cardiovascular Disease | Admitting: Cardiology

## 2014-09-10 VITALS — BP 110/90 | HR 58 | Ht 73.0 in | Wt 210.0 lb

## 2014-09-10 DIAGNOSIS — I779 Disorder of arteries and arterioles, unspecified: Secondary | ICD-10-CM

## 2014-09-10 DIAGNOSIS — I10 Essential (primary) hypertension: Secondary | ICD-10-CM

## 2014-09-10 DIAGNOSIS — E785 Hyperlipidemia, unspecified: Secondary | ICD-10-CM

## 2014-09-10 DIAGNOSIS — Z87891 Personal history of nicotine dependence: Secondary | ICD-10-CM | POA: Diagnosis not present

## 2014-09-10 DIAGNOSIS — I2510 Atherosclerotic heart disease of native coronary artery without angina pectoris: Secondary | ICD-10-CM | POA: Insufficient documentation

## 2014-09-10 DIAGNOSIS — I739 Peripheral vascular disease, unspecified: Secondary | ICD-10-CM

## 2014-09-10 LAB — HEPATIC FUNCTION PANEL
ALT: 53 U/L (ref 0–53)
AST: 34 U/L (ref 0–37)
Albumin: 4.2 g/dL (ref 3.5–5.2)
Alkaline Phosphatase: 54 U/L (ref 39–117)
Bilirubin, Direct: 0.1 mg/dL (ref 0.0–0.3)
Total Bilirubin: 1 mg/dL (ref 0.2–1.2)
Total Protein: 7 g/dL (ref 6.0–8.3)

## 2014-09-10 LAB — BASIC METABOLIC PANEL
BUN: 17 mg/dL (ref 6–23)
CO2: 28 mEq/L (ref 19–32)
Calcium: 9.6 mg/dL (ref 8.4–10.5)
Chloride: 103 mEq/L (ref 96–112)
Creatinine, Ser: 1.1 mg/dL (ref 0.4–1.5)
GFR: 69.22 mL/min (ref >60.00)
Glucose, Bld: 107 mg/dL — ABNORMAL HIGH (ref 70–99)
Potassium: 4 mEq/L (ref 3.5–5.1)
Sodium: 141 mEq/L (ref 135–145)

## 2014-09-10 LAB — CBC
HCT: 42.4 % (ref 39.0–52.0)
Hemoglobin: 14.1 g/dL (ref 13.0–17.0)
MCHC: 33.3 g/dL (ref 30.0–36.0)
MCV: 88.9 fl (ref 78.0–100.0)
Platelets: 192 10*3/uL (ref 150.0–400.0)
RBC: 4.77 Mil/uL (ref 4.22–5.81)
RDW: 13 % (ref 11.5–15.5)
WBC: 6.9 10*3/uL (ref 4.0–10.5)

## 2014-09-10 LAB — LIPID PANEL
Cholesterol: 151 mg/dL (ref 0–200)
HDL: 30.2 mg/dL — ABNORMAL LOW (ref >39.00)
LDL Cholesterol: 102 mg/dL — ABNORMAL HIGH (ref 0–99)
NonHDL: 120.8
Total CHOL/HDL Ratio: 5
Triglycerides: 92 mg/dL (ref 0.0–149.0)
VLDL: 18.4 mg/dL (ref 0.0–40.0)

## 2014-09-10 NOTE — Progress Notes (Signed)
Carotid duplex performed 

## 2014-09-10 NOTE — Patient Instructions (Signed)
Your physician wants you to follow-up in:  12 months.  You will receive a reminder letter in the mail two months in advance. If you don't receive a letter, please call our office to schedule the follow-up appointment.  Your physician has requested that you have a carotid duplex. This test is an ultrasound of the carotid arteries in your neck. It looks at blood flow through these arteries that supply the brain with blood. Allow one hour for this exam. There are no restrictions or special instructions.   Lab work to be done today--CBC, BMP, Lipid and Liver profiles

## 2014-09-10 NOTE — Progress Notes (Signed)
History of Present Illness: 63 yo WM with history of CAD, HTN, hyperlipidemia, carotid artery disease s/p CEA who is here today for cardiac follow up. He has been followed in the past by Dr. Olevia Perches. In 2002 he had a bare metal stent placed in the right coronary artery. A catheterization in 2008 showed non-obstructive disease. Had a Myoview scan in March 2011 which was negative for ischemia. He had duplex studies of his carotid in June 2012 with mild disease and stable right CEA site.   He is here today for follow up. He has had no chest pain or SOB. He is very active. Taking all meds.   Primary Care Physician: Formerly Norins, now Hulan Saas  Last Lipid Profile:Lipid Panel     Component Value Date/Time   CHOL 158 07/01/2012 0943   TRIG 135.0 07/01/2012 0943   HDL 27.60* 07/01/2012 0943   CHOLHDL 6 07/01/2012 0943   VLDL 27.0 07/01/2012 0943   LDLCALC 103* 07/01/2012 0943    Past Medical History  Diagnosis Date  . Coronary artery disease     s/p BMS to RCA 2002; [place] 2008: oLAD 40%, pRCA stent ok with 20%, EF 60%); Myoview scan in March 2011 which was negative for ischemia   . Hypertension   . Hyperlipidemia   . Bradycardia   . Carotid artery disease     Right CEA;  dopplers 6/12:  0-39% bilat ICA  . GERD (gastroesophageal reflux disease)   . Obstructive sleep apnea     Past Surgical History  Procedure Laterality Date  . Knee arthroscopy      right  . Carotid endarterectomy  06/26/2002    right  . Coronary angioplasty with stent placement  03/08/2000    Bare-metal stent in RCA, in [place]  . Cardiac catheterization  06/01/2007     EF of 65% -- Nonobstructive CAD with 40% ostial stenosis in the LAD, no significant obstruction in the circumflex artery, 20% narrowing within the stent in the proximal to mid right coronary and normal LV function  . Cardiac catheterization  02/02/2006    EF of 50%  . Cardiac catheterization  01/19/2002    EF of 60%  . Mole removal      Prior moles removed    Current Outpatient Prescriptions  Medication Sig Dispense Refill  . amLODipine (NORVASC) 10 MG tablet Take 1 tablet by mouth  daily 90 tablet 0  . aspirin 81 MG tablet Take 81 mg by mouth daily.      Marland Kitchen atenolol (TENORMIN) 50 MG tablet Take one-half tablet by  mouth every day 90 tablet 0  . clopidogrel (PLAVIX) 75 MG tablet Take 1 tablet by mouth  daily 90 tablet 0  . CRESTOR 40 MG tablet Take 1 tablet by mouth  daily 90 tablet 0  . FOLBIC 2.5-25-2 MG TABS Take 1 tablet by mouth  daily 90 each 0  . hydrochlorothiazide (HYDRODIURIL) 50 MG tablet Take one-half tablet by  mouth daily 90 tablet 0  . isosorbide mononitrate (IMDUR) 30 MG 24 hr tablet Take 1 tablet (30 mg total) by mouth daily. 90 tablet 2  . meloxicam (MOBIC) 15 MG tablet Take 1 tablet by mouth  daily 14 tablet 0  . nitroGLYCERIN (NITROSTAT) 0.4 MG SL tablet Place 0.4 mg under the tongue every 5 (five) minutes as needed.    . pantoprazole (PROTONIX) 40 MG tablet Take 1 tablet by mouth  daily 90 tablet 0  . potassium chloride (K-DUR)  10 MEQ tablet Take 1 tablet (10 mEq total) by mouth daily. 90 tablet 3  . potassium chloride (K-DUR,KLOR-CON) 10 MEQ tablet Take 1 tablet by mouth  daily. 90 tablet 0  . ramipril (ALTACE) 10 MG capsule Take 1 capsule (10 mg total) by mouth 2 (two) times daily. 180 capsule 3   No current facility-administered medications for this visit.    Allergies  Allergen Reactions  . Felodipine Swelling  . Niacin     REACTION: not tolerated.    History   Social History  . Marital Status: Married    Spouse Name: N/A    Number of Children: N/A  . Years of Education: N/A   Occupational History  . Not on file.   Social History Main Topics  . Smoking status: Former Smoker -- 1.00 packs/day for 30 years    Types: Cigarettes    Quit date: 10/19/2000  . Smokeless tobacco: Former Systems developer    Types: Snuff  . Alcohol Use: Yes     Comment: Occasional beer  . Drug Use: No  . Sexual  Activity: Not on file   Other Topics Concern  . Not on file   Social History Narrative    Family History  Problem Relation Age of Onset  . Heart failure Father   . Peripheral vascular disease Mother     with pacemaker, and had a carotid endarterectomy  . Hypertension Brother   . Hypertension Brother     Review of Systems:  As stated in the HPI and otherwise negative.   BP 110/90 mmHg  Pulse 58  Ht 6\' 1"  (1.854 m)  Wt 210 lb (95.255 kg)  BMI 27.71 kg/m2  Physical Examination: General: Well developed, well nourished, NAD HEENT: OP clear, mucus membranes moist SKIN: warm, dry. No rashes. Neuro: No focal deficits Musculoskeletal: Muscle strength 5/5 all ext Psychiatric: Mood and affect normal Neck: No JVD, no carotid bruits, no thyromegaly, no lymphadenopathy. Lungs:Clear bilaterally, no wheezes, rhonci, crackles Cardiovascular: Regular rate and rhythm. No murmurs, gallops or rubs. Abdomen:Soft. Bowel sounds present. Non-tender.  Extremities: No lower extremity edema. Pulses are 2 + in the bilateral DP/PT.  EKG: Sinus brady, rate 58 bpm.   Assessment and Plan:   1. Coronary Artery Disease: Doing well. No angina. Continue current therapy with ASA, Plavix, statin, beta blocker, Imdur.   2. Hyperlipidemia:  Continue statin. Check lipids and LFTs today.   3. Hypertension: BP controlled. No changes in medications.   4. Carotid Artery Disease: Arrange f/u dopplers today

## 2014-09-12 ENCOUNTER — Telehealth: Payer: Self-pay | Admitting: Cardiovascular Disease

## 2014-09-12 ENCOUNTER — Telehealth: Payer: Self-pay | Admitting: *Deleted

## 2014-09-12 DIAGNOSIS — E785 Hyperlipidemia, unspecified: Secondary | ICD-10-CM

## 2014-09-12 MED ORDER — EZETIMIBE 10 MG PO TABS: 10.0000 mg | ORAL_TABLET | Freq: Every day | ORAL | Status: DC

## 2014-09-12 NOTE — Addendum Note (Signed)
Addended by: Thompson Grayer on: 09/12/2014 01:15 PM   Modules accepted: Orders

## 2014-09-12 NOTE — Telephone Encounter (Signed)
Opened in error

## 2014-09-12 NOTE — Telephone Encounter (Signed)
New Msg  Patient returning a call. Please contact at [phone].

## 2014-09-12 NOTE — Telephone Encounter (Signed)
Spoke with pt and reviewed lab results, carotid doppler results and recommendations from Dr. Angelena Form with him.  He would like to start Zetia.  He has private insurance but [facility].  Will send in.  Will also leave copay card for Zetia and samples at front desk for his daughter to pick up.  Zetia 10 mg, 28 tablets, Lot [id number], exp 3/18 left at desk.  Pt will come in for fasting lab work on December 11, 2014.

## 2014-09-13 ENCOUNTER — Other Ambulatory Visit: Payer: Self-pay | Admitting: Cardiovascular Disease

## 2014-09-21 ENCOUNTER — Telehealth: Payer: Self-pay | Admitting: Internal Medicine

## 2014-09-21 DIAGNOSIS — G4733 Obstructive sleep apnea (adult) (pediatric): Secondary | ICD-10-CM

## 2014-09-21 NOTE — Telephone Encounter (Signed)
Looks like [facility]'s faxed order at 2:55pm [facility] and spoke with [facility] in the CPAP replacement dept - verified that they have not received order Called spoke with [facility]/ [facility] who reported that they still do not have access to epic (system-wide outages) but she requested that I print off the order and fax it to [facility] at [phone] and she will help to look out for the order.  This has been done.  Called spoke with pt's spouse and discussed the above with her.  Advised spouse to let us know if she hasn't heard anything before end of business today.  Will hold in triage to follow as pt and spouse are anxious to get this issue resolved.

## 2014-09-21 NOTE — Telephone Encounter (Signed)
Spoke with pt wife, aware that order placed. Will relay message to patient.  Nothing further needed.

## 2014-09-21 NOTE — Telephone Encounter (Signed)
Ok to order [facility]- replacement CPAP hose and supplies as needed. Patient requests 2 hoses. Dx OSA

## 2014-09-21 NOTE — Telephone Encounter (Signed)
Spoke with pt's wife, states that the hose on pt's cpap broke last night, is requesting a new rx for 2 hoses be sent to [facility] so they have a spare and won't run into this problem again. Last ov- 06/2012  Where can this pt be scheduled for rov, and are you ok with ordering this in the meantime?  Thanks!  Allergies  Allergen Reactions  . Felodipine Swelling  . Niacin     REACTION: not tolerated.   Current Outpatient Prescriptions on File Prior to Visit  Medication Sig Dispense Refill  . amLODipine (NORVASC) 10 MG tablet Take 1 tablet by mouth  daily 90 tablet 0  . aspirin 81 MG tablet Take 81 mg by mouth daily.      Marland Kitchen atenolol (TENORMIN) 50 MG tablet Take one-half tablet by  mouth every day 45 tablet 1  . clopidogrel (PLAVIX) 75 MG tablet Take 1 tablet by mouth  daily 90 tablet 0  . CRESTOR 40 MG tablet Take 1 tablet by mouth  daily 90 tablet 0  . ezetimibe (ZETIA) 10 MG tablet Take 1 tablet (10 mg total) by mouth daily. 90 tablet 3  . FOLBIC 2.5-25-2 MG TABS Take 1 tablet by mouth  daily 90 each 0  . hydrochlorothiazide (HYDRODIURIL) 50 MG tablet Take one-half tablet by  mouth daily 90 tablet 0  . isosorbide mononitrate (IMDUR) 30 MG 24 hr tablet Take 1 tablet (30 mg total) by mouth daily. 90 tablet 2  . meloxicam (MOBIC) 15 MG tablet Take 1 tablet by mouth  daily 14 tablet 0  . nitroGLYCERIN (NITROSTAT) 0.4 MG SL tablet Place 0.4 mg under the tongue every 5 (five) minutes as needed.    . pantoprazole (PROTONIX) 40 MG tablet Take 1 tablet by mouth  daily 90 tablet 0  . potassium chloride (K-DUR) 10 MEQ tablet Take 1 tablet (10 mEq total) by mouth daily. 90 tablet 3  . potassium chloride (K-DUR,KLOR-CON) 10 MEQ tablet Take 1 tablet by mouth  daily. 90 tablet 0  . ramipril (ALTACE) 10 MG capsule Take 1 capsule (10 mg total) by mouth 2 (two) times daily. 180 capsule 3   No current facility-administered medications on file prior to visit.

## 2014-09-24 NOTE — Telephone Encounter (Signed)
LMTCB to see if everything was taken care of. [place], CMA

## 2014-09-25 ENCOUNTER — Ambulatory Visit: Payer: 59 | Admitting: Family Medicine

## 2014-09-25 NOTE — Telephone Encounter (Signed)
LMTC x 1  

## 2014-09-25 NOTE — Telephone Encounter (Signed)
[phone] returning a call

## 2014-09-25 NOTE — Telephone Encounter (Signed)
LMTCB

## 2014-09-26 NOTE — Telephone Encounter (Signed)
LMTCB-wanted to confirm that the patient has received all supplies he needed. Thanks.

## 2014-09-26 NOTE — Telephone Encounter (Signed)
Wife called back-they did receive the item needed after the daughter had to go pick up; this is the 2nd time patient has had trouble with [facility]. They are looking into changing DME's and wife will call back with new message to give [place] name of new DME they would like to go through. Nothing further needed at this time.

## 2014-09-27 ENCOUNTER — Ambulatory Visit (INDEPENDENT_AMBULATORY_CARE_PROVIDER_SITE_OTHER): Payer: 59 | Admitting: Family Medicine

## 2014-09-27 ENCOUNTER — Encounter: Payer: Self-pay | Admitting: Family Medicine

## 2014-09-27 VITALS — BP 122/72 | HR 64 | Ht 73.0 in | Wt 200.0 lb

## 2014-09-27 DIAGNOSIS — M79661 Pain in right lower leg: Secondary | ICD-10-CM

## 2014-09-27 DIAGNOSIS — M79662 Pain in left lower leg: Secondary | ICD-10-CM

## 2014-09-27 MED ORDER — DICLOFENAC SODIUM 2 % TD SOLN: TRANSDERMAL | Status: DC

## 2014-09-27 NOTE — Assessment & Plan Note (Signed)
This is likely secondary to him being on concrete, this is likely secondary to more mechanical symptoms. Patient has been wearing heel lifts and not getting significant better. Patient was given a pneumatic compression heel cup that I think will be beneficial as well. We discussed compression stockings and patient was given topical anti-inflammatories and patient will avoid oral anti-inflammatories secondary to his other comorbidities. We discussed icing after activity and given home exercises as well. Patient will try these conservative measures. Patient continues to have difficulty secondary to his coronary artery disease I would consider an ABI to rule out peripheral vascular disease but this is low likelihood with patient having no atrophy of the musculature or any alopecia of the legs.  Spent greater than 25 minutes with patient face-to-face and had greater than 50% of counseling including as described above in assessment and plan.

## 2014-09-27 NOTE — Patient Instructions (Signed)
Good to see you Happy holidays! Try the pennsaid up to 2 times daily.  Wear the new brace Continue with the heel cups.  Exercises 3 times a week.  Compression socks could help a lot See me when you need me.

## 2014-09-27 NOTE — Progress Notes (Signed)
   CC: Right foot pain, lateral leg pain  HPI: Patient is a very pleasant 63 year old gentleman who has had plantar fasciitis.  Patient states that he has been doing better but continues to have some mild right heel pain that seems to be worse with standing. Patient continues at the heel cups and is doing relatively well. Patient states that the meloxicam that I did give him does help but if he takes it for multiple days in a row he had quickly does have swelling in his lower extremity. Patient does have coronary artery disease but denies any chest pain. Patient states that the pain is not stopping him from any activities but has noticed that both of his legs especially his calves seem to ache at the end of a long day. Patient describes it as an aching or muscle soreness. No redness and no signs of infection he states. No fevers or chills or any abnormal weight loss.  Past medical, surgical, family and social history reviewed. Reviewed today by me.  Medications reviewed all in the electronic medical record.   Review of Systems: No headache, visual changes, nausea, vomiting, diarrhea, constipation, dizziness, abdominal pain, skin rash, fevers, chills, night sweats, weight loss, swollen lymph nodes, body aches, joint swelling, muscle aches, chest pain, shortness of breath, mood changes.   Objective:    Pulse 64, height 6\' 1"  (1.854 m), weight 200 lb (90.719 kg), SpO2 96 %.   General: No apparent distress alert and oriented x3 mood and affect normal, dressed appropriately.  HEENT: Pupils equal, extraocular movements intact Respiratory: Patient's speak in full sentences and does not appear short of breath Cardiovascular: No lower extremity edema, non tender, no erythema Skin: Warm dry intact with no signs of infection or rash on extremities or on axial skeleton. Abdomen: Soft nontender Neuro: Cranial nerves II through XII are intact, neurovascularly intact in all extremities with 2+ DTRs and 2+  pulses. Lymph: No lymphadenopathy of posterior or anterior cervical chain or axillae bilaterally.  Gait normal with good balance and coordination.  MSK: Non tender with full range of motion and good stability and symmetric strength and tone of shoulders, elbows, wrist, hip, knee and ankles bilaterally.   right foot exam: Calves are minimally tender to palpation Normal inspection with no visable or palpable fat pad atrophy and no visible swelling/erythema. Patient is tender at medial insertion of plantar fascia into calcaneus. Great toe motion: Mild hallux rigidus Arch shape: Patient is having breakdown of the longitudinal arch Other foot breakdown: Mild breakdown of the transverse arch    Impression and Recommendations:     This case required medical decision making of moderate complexity.

## 2014-10-09 ENCOUNTER — Other Ambulatory Visit: Payer: Self-pay | Admitting: Cardiovascular Disease

## 2014-10-09 ENCOUNTER — Other Ambulatory Visit: Payer: Self-pay

## 2014-11-27 ENCOUNTER — Other Ambulatory Visit: Payer: Self-pay | Admitting: Family Medicine

## 2014-12-06 ENCOUNTER — Telehealth: Payer: Self-pay | Admitting: Cardiovascular Disease

## 2014-12-06 NOTE — Telephone Encounter (Signed)
New Message  Pt wife called to see if pt can do lab work at local hospital. Pelase call back and discuss.

## 2014-12-06 NOTE — Telephone Encounter (Signed)
Spoke with pt's wife. She reports pt started Zetia but it made him feel horrible. He was going to restart it and see if it was Zetia or something else that made him feel bad.  He has not restarted.  Wife states he is going to try it again and will let us know how he does. If able to tolerate will need lipid profile in 3 months.  If unable to tolerate will review with Dr. Angelena Form for further recommendations.  He is out of town for several weeks at a time and I told him we could send prescription to him for lab work if he would like drawn at another location.

## 2014-12-11 ENCOUNTER — Other Ambulatory Visit: Payer: 59

## 2015-01-07 ENCOUNTER — Other Ambulatory Visit: Payer: Self-pay | Admitting: Cardiovascular Disease

## 2015-01-27 ENCOUNTER — Other Ambulatory Visit: Payer: Self-pay | Admitting: Cardiovascular Disease

## 2015-03-10 ENCOUNTER — Other Ambulatory Visit: Payer: Self-pay | Admitting: Cardiovascular Disease

## 2015-03-18 ENCOUNTER — Other Ambulatory Visit: Payer: Self-pay | Admitting: Cardiovascular Disease

## 2015-03-18 ENCOUNTER — Other Ambulatory Visit: Payer: Self-pay | Admitting: Family Medicine

## 2015-03-19 ENCOUNTER — Other Ambulatory Visit: Payer: Self-pay | Admitting: *Deleted

## 2015-03-19 DIAGNOSIS — E785 Hyperlipidemia, unspecified: Secondary | ICD-10-CM

## 2015-03-19 MED ORDER — CLOPIDOGREL BISULFATE 75 MG PO TABS: ORAL_TABLET | ORAL | Status: DC

## 2015-03-19 MED ORDER — EZETIMIBE 10 MG PO TABS: 10.0000 mg | ORAL_TABLET | Freq: Every day | ORAL | Status: DC

## 2015-03-19 NOTE — Telephone Encounter (Signed)
Refill done.  

## 2015-07-17 ENCOUNTER — Other Ambulatory Visit: Payer: Self-pay | Admitting: *Deleted

## 2015-07-17 ENCOUNTER — Telehealth: Payer: Self-pay | Admitting: Cardiovascular Disease

## 2015-07-17 MED ORDER — NITROGLYCERIN 0.4 MG SL SUBL: 0.4000 mg | SUBLINGUAL_TABLET | SUBLINGUAL | Status: DC | PRN

## 2015-07-17 NOTE — Telephone Encounter (Signed)
LMOM for pt that we received his request for his Nitro to be refilled at [facility] in [place].  Advised pt to give [place] a call if he had any questions.

## 2015-07-17 NOTE — Telephone Encounter (Signed)
New message      Refill nitro at [facility]

## 2015-07-26 ENCOUNTER — Other Ambulatory Visit: Payer: Self-pay | Admitting: Cardiovascular Disease

## 2015-08-05 ENCOUNTER — Other Ambulatory Visit: Payer: Self-pay | Admitting: Family Medicine

## 2015-08-05 NOTE — Telephone Encounter (Signed)
Refill done.  Pt needs appointment for future refills.  

## 2015-08-16 ENCOUNTER — Other Ambulatory Visit: Payer: Self-pay | Admitting: *Deleted

## 2015-08-16 ENCOUNTER — Other Ambulatory Visit: Payer: Self-pay | Admitting: Cardiovascular Disease

## 2015-08-16 DIAGNOSIS — E785 Hyperlipidemia, unspecified: Secondary | ICD-10-CM

## 2015-08-16 MED ORDER — EZETIMIBE 10 MG PO TABS: 10.0000 mg | ORAL_TABLET | Freq: Every day | ORAL | Status: DC

## 2015-08-28 ENCOUNTER — Ambulatory Visit: Payer: Self-pay | Admitting: Family

## 2015-09-09 ENCOUNTER — Encounter: Payer: Self-pay | Admitting: Family

## 2015-09-09 ENCOUNTER — Ambulatory Visit (INDEPENDENT_AMBULATORY_CARE_PROVIDER_SITE_OTHER): Payer: Commercial Managed Care - HMO | Admitting: Family

## 2015-09-09 VITALS — BP 134/80 | HR 59 | Temp 97.9°F | Resp 18 | Ht 73.0 in | Wt 199.0 lb

## 2015-09-09 DIAGNOSIS — K219 Gastro-esophageal reflux disease without esophagitis: Secondary | ICD-10-CM

## 2015-09-09 DIAGNOSIS — I10 Essential (primary) hypertension: Secondary | ICD-10-CM | POA: Diagnosis not present

## 2015-09-09 DIAGNOSIS — I2510 Atherosclerotic heart disease of native coronary artery without angina pectoris: Secondary | ICD-10-CM | POA: Diagnosis not present

## 2015-09-09 DIAGNOSIS — E785 Hyperlipidemia, unspecified: Secondary | ICD-10-CM

## 2015-09-09 MED ORDER — EZETIMIBE 10 MG PO TABS: 10.0000 mg | ORAL_TABLET | Freq: Every day | ORAL | Status: DC

## 2015-09-09 NOTE — Assessment & Plan Note (Signed)
Hyperlipidemia currently maintained on Zetia and Crestor. Denies adverse side effects or myalgias. Continue current dosage of Zetia and Crestor. Lipid profile through cardiology. Follow-up as needed.

## 2015-09-09 NOTE — Progress Notes (Signed)
Pre visit review using our clinic review tool, if applicable. No additional management support is needed unless otherwise documented below in the visit note. 

## 2015-09-09 NOTE — Assessment & Plan Note (Signed)
Stable and below goal of 140/90 with current regimen. Denies adverse side effects. Continue current dosage of ramipril, hydrochlorothiazide, and atenolol. Encouraged to monitor blood pressure at home. Follow-up with cardiology as scheduled.

## 2015-09-09 NOTE — Progress Notes (Signed)
Subjective:    Patient ID: [name], male    DOB: Nov 09, 1950, 64 y.o.   MRN: [id number]  Chief Complaint  Patient presents with  . Establish Care    Refill for zetia?     HPI:  [name] is a 64 y.o. male who  has a past medical history of Coronary artery disease; Hypertension; Hyperlipidemia; Bradycardia; Carotid artery disease ([place]); GERD (gastroesophageal reflux disease); and Obstructive sleep apnea. and presents today for an office visit to establish care.   1.)  Hypertension - currently maintained on ramipril, hydrochlorothiazide, and atenolol. Takes the medication as prescribed and denies adverse side effects or hypertensive events. Denies symptoms of end organ damage. Does not currently frequently monitor blood pressure at home as he is gone on trips a lot.  BP Readings from Last 3 Encounters:  09/09/15 134/80  09/27/14 122/72  09/10/14 110/90   2.) Hyperlipidemia - currently maintained on Zetia and Crestor. Takes the medication as prescribed and denies adverse side effects. Denies myalgias.  3.) GERD - stable with current dosage of pantoprazole. Takes medications prescribed and denies adverse side effects. Reports his reflux is well controlled with current medication regimen.  Allergies  Allergen Reactions  . Felodipine Swelling  . Niacin     REACTION: not tolerated.     Current Outpatient Prescriptions on File Prior to Visit  Medication Sig Dispense Refill  . amLODipine (NORVASC) 10 MG tablet Take 1 tablet by mouth  daily 90 tablet 0  . aspirin 81 MG tablet Take 81 mg by mouth daily.      Marland Kitchen atenolol (TENORMIN) 50 MG tablet Take one-half tablet by  mouth every day 45 tablet 2  . clopidogrel (PLAVIX) 75 MG tablet Take 1 tablet by mouth  daily 90 tablet 1  . CRESTOR 40 MG tablet Take 1 tablet by mouth  daily 90 tablet 0  . FOLBIC 2.5-25-2 MG TABS tablet Take 1 tablet by mouth  daily 90 tablet 0  . hydrochlorothiazide (HYDRODIURIL) 50 MG tablet Take one-half  tablet by  mouth daily 45 tablet 0  . isosorbide mononitrate (IMDUR) 30 MG 24 hr tablet Take 1 tablet by mouth  daily 90 tablet 0  . meloxicam (MOBIC) 15 MG tablet Take 1 tablet by mouth  daily 90 tablet 0  . nitroGLYCERIN (NITROSTAT) 0.4 MG SL tablet Place 1 tablet (0.4 mg total) under the tongue every 5 (five) minutes as needed. 25 tablet 3  . pantoprazole (PROTONIX) 40 MG tablet Take 1 tablet by mouth  daily 90 tablet 0  . potassium chloride (K-DUR) 10 MEQ tablet Take 1 tablet (10 mEq total) by mouth daily. 90 tablet 3  . potassium chloride (K-DUR,KLOR-CON) 10 MEQ tablet Take 1 tablet by mouth  daily 90 tablet 1  . ramipril (ALTACE) 10 MG capsule Take 1 capsule by mouth two times daily 180 capsule 0   No current facility-administered medications on file prior to visit.    Past Medical History  Diagnosis Date  . Coronary artery disease     s/p BMS to RCA 2002; [place] 2008: oLAD 40%, pRCA stent ok with 20%, EF 60%); Myoview scan in March 2011 which was negative for ischemia   . Hypertension   . Hyperlipidemia   . Bradycardia   . Carotid artery disease ([place])     Right CEA;  dopplers 6/12:  0-39% bilat ICA  . GERD (gastroesophageal reflux disease)   . Obstructive sleep apnea  Review of Systems  Constitutional: Negative for fever and chills.  Respiratory: Negative for chest tightness and shortness of breath.   Cardiovascular: Negative for chest pain, palpitations and leg swelling.  Neurological: Negative for weakness and headaches.      Objective:    BP 134/80 mmHg  Pulse 59  Temp(Src) 97.9 F (36.6 C) (Oral)  Resp 18  Ht 6\' 1"  (1.854 m)  Wt 199 lb (90.266 kg)  BMI 26.26 kg/m2  SpO2 97% Nursing note and vital signs reviewed.  Physical Exam  Constitutional: He is oriented to person, place, and time. He appears well-developed and well-nourished. No distress.  Cardiovascular: Normal rate, regular rhythm, normal heart sounds and intact distal pulses.   Pulmonary/Chest:  Effort normal and breath sounds normal.  Neurological: He is alert and oriented to person, place, and time.  Skin: Skin is warm and dry.  Psychiatric: He has a normal mood and affect. His behavior is normal. Judgment and thought content normal.       Assessment & Plan:   Problem List Items Addressed This Visit      Cardiovascular and Mediastinum   Essential hypertension    Stable and below goal of 140/90 with current regimen. Denies adverse side effects. Continue current dosage of ramipril, hydrochlorothiazide, and atenolol. Encouraged to monitor blood pressure at home. Follow-up with cardiology as scheduled.      Relevant Medications   ezetimibe (ZETIA) 10 MG tablet   Coronary atherosclerosis    Stable and managed through risk reduction of hypertension, hyperlipidemia, and tobacco use. Encouraged a nutritional intake that is moderate and varied that emphasizes nutrient dense foods and is low in saturated/transfer fats. Encouraged to discontinue use of smokeless tobacco secondary to increased risk for coronary artery disease. Cessation material provided to patient. Continue monitoring and follow-up with cardiology.      Relevant Medications   ezetimibe (ZETIA) 10 MG tablet     Digestive   GASTROESOPHAGEAL REFLUX DISEASE    Stable and asymptomatic with current regimen. Continue current dosage of pantoprazole. Check B12 at next annual exam.        Other   Hyperlipidemia - Primary    Hyperlipidemia currently maintained on Zetia and Crestor. Denies adverse side effects or myalgias. Continue current dosage of Zetia and Crestor. Lipid profile through cardiology. Follow-up as needed.      Relevant Medications   ezetimibe (ZETIA) 10 MG tablet

## 2015-09-09 NOTE — Assessment & Plan Note (Signed)
Stable and asymptomatic with current regimen. Continue current dosage of pantoprazole. Check B12 at next annual exam.

## 2015-09-09 NOTE — Assessment & Plan Note (Signed)
Stable and managed through risk reduction of hypertension, hyperlipidemia, and tobacco use. Encouraged a nutritional intake that is moderate and varied that emphasizes nutrient dense foods and is low in saturated/transfer fats. Encouraged to discontinue use of smokeless tobacco secondary to increased risk for coronary artery disease. Cessation material provided to patient. Continue monitoring and follow-up with cardiology.

## 2015-09-09 NOTE — Patient Instructions (Addendum)
Thank you for choosing [facility].  Summary/Instructions:  Please schedule a time for your physical at your convenience.   Continue to take your medications as prescribed.   Your prescription(s) have been submitted to your pharmacy or been printed and provided for you. Please take as directed and contact our office if you believe you are having problem(s) with the medication(s) or have any questions.  If your symptoms worsen or fail to improve, please contact our office for further instruction, or in case of emergency go directly to the emergency room at the closest medical facility.   Smokeless Tobacco Use Smokeless tobacco is a loose, fine, or stringy tobacco. The tobacco is not smoked like a cigarette, but it is chewed or held in the lips or cheeks. It resembles tea and comes from the leaves of the tobacco plant. Smokeless tobacco is usually flavored, sweetened, or processed in some way. Although smokeless tobacco is not smoked into the lungs, its chemicals are absorbed through the membranes in the mouth and into the bloodstream. Its chemicals are also swallowed in saliva. The chemicals (nicotine and other toxins) are known to cause cancer. Smokeless tobacco contains up to 28 differentcarcinogens. CAUSES Nicotine is addictive. Smokeless tobacco contains nicotine, which is a stimulant. This stimulant can give you a "buzz" or altered state. People can become addicted to the feeling it delivers.  SYMPTOMS Smokeless tobacco can cause health problems, including:  Bad breath.  Yellow-brown teeth.  Mouth sores.  Cracking and bleeding lips.  Gum disease, gum recession, and bone loss around the teeth.  Tooth decay.  Increased or irregular heart rate.  High blood pressure, heart disease, and stroke.  Cancer of the mouth, lips, tongue, pancreas, voice box (larynx), esophagus, colon, and bladder.  Precancerous lesion of the soft tissues of the mouth (leukoplakia).  Loss of  your sense of taste. TREATMENT Talk with your caregiver about ways you can quit. Quitting tobacco is a good decision for your health. Nicotine is addictive, but several options are available to help you quit including:  Nicotine replacement therapy (gum or patch).  Support and cessation programs. The following tips can help you quit:  Write down the reasons you would like to quit and look at them often.  Set a date during a low stress time to stop or cut back.  Ask family and friends for their support.  Remove all tobacco products from your home and work.  Replace the chewing tobacco with things like beef jerky, sunflower seeds, or shredded coconut.  Avoid situations that may make you want to chew tobacco.  Exercise and eat a healthy diet.  When you crave tobacco, distract yourself with drinking water, sugarless chewing gum, sugarless hard candy, exercising, or deep breathing. HOME CARE INSTRUCTIONS  See your dentist for regular oral health exams every 6 months.  Follow up with your caregiver as recommended. SEEK MEDICAL CARE OR DENTAL CARE IF:  You have bleeding or cracking lips, gums, or cheeks.  You have mouth sores, discolorations, or pain.  You have tooth pain.  You develop persistent irritation, burning, or sores in the mouth.  You have pain, tenderness, or numbness in the mouth.  You develop a lump, bumpy patch, or hardened skin inside the mouth.  The color changes inside your mouth (gray, white, or red spots).  You have difficulty chewing, swallowing, or speaking.   This information is not intended to replace advice given to you by your health care provider. Make sure you discuss any  questions you have with your health care provider.   Document Released: 03/09/2011 Document Revised: 12/28/2011 Document Reviewed: 03/09/2011 Elsevier Interactive Patient Education [employer].

## 2015-10-24 ENCOUNTER — Other Ambulatory Visit: Payer: Self-pay | Admitting: Cardiovascular Disease

## 2015-10-29 ENCOUNTER — Institutional Professional Consult (permissible substitution): Payer: Self-pay | Admitting: Internal Medicine

## 2015-10-31 ENCOUNTER — Ambulatory Visit: Payer: Self-pay | Admitting: Cardiovascular Disease

## 2015-11-26 ENCOUNTER — Other Ambulatory Visit: Payer: Self-pay | Admitting: Family Medicine

## 2015-11-26 ENCOUNTER — Other Ambulatory Visit: Payer: Self-pay | Admitting: Cardiovascular Disease

## 2015-11-26 NOTE — Telephone Encounter (Signed)
Refill denied. Pt has not been seen in over a year.  

## 2015-11-28 ENCOUNTER — Institutional Professional Consult (permissible substitution): Payer: Commercial Managed Care - HMO | Admitting: Pulmonary Disease

## 2015-12-02 ENCOUNTER — Telehealth: Payer: Self-pay | Admitting: Cardiovascular Disease

## 2015-12-02 ENCOUNTER — Encounter (HOSPITAL_COMMUNITY): Payer: Self-pay | Admitting: *Deleted

## 2015-12-02 ENCOUNTER — Inpatient Hospital Stay (HOSPITAL_COMMUNITY)
Admission: EM | Admit: 2015-12-02 | Discharge: 2015-12-03 | DRG: 287 | Disposition: A | Discharge status: Home / Self Care | Payer: Managed Care, Other (non HMO) | Attending: Cardiovascular Disease | Admitting: Cardiovascular Disease

## 2015-12-02 ENCOUNTER — Emergency Department (HOSPITAL_COMMUNITY): Payer: Managed Care, Other (non HMO)

## 2015-12-02 ENCOUNTER — Other Ambulatory Visit: Payer: Self-pay

## 2015-12-02 DIAGNOSIS — Z7982 Long term (current) use of aspirin: Secondary | ICD-10-CM | POA: Diagnosis not present

## 2015-12-02 DIAGNOSIS — I200 Unstable angina: Secondary | ICD-10-CM

## 2015-12-02 DIAGNOSIS — E876 Hypokalemia: Secondary | ICD-10-CM | POA: Diagnosis present

## 2015-12-02 DIAGNOSIS — E785 Hyperlipidemia, unspecified: Secondary | ICD-10-CM | POA: Diagnosis present

## 2015-12-02 DIAGNOSIS — G4733 Obstructive sleep apnea (adult) (pediatric): Secondary | ICD-10-CM | POA: Diagnosis present

## 2015-12-02 DIAGNOSIS — Z79899 Other long term (current) drug therapy: Secondary | ICD-10-CM

## 2015-12-02 DIAGNOSIS — Z87891 Personal history of nicotine dependence: Secondary | ICD-10-CM

## 2015-12-02 DIAGNOSIS — Z888 Allergy status to other drugs, medicaments and biological substances status: Secondary | ICD-10-CM | POA: Diagnosis not present

## 2015-12-02 DIAGNOSIS — I2510 Atherosclerotic heart disease of native coronary artery without angina pectoris: Secondary | ICD-10-CM | POA: Diagnosis present

## 2015-12-02 DIAGNOSIS — Z7902 Long term (current) use of antithrombotics/antiplatelets: Secondary | ICD-10-CM | POA: Diagnosis not present

## 2015-12-02 DIAGNOSIS — K219 Gastro-esophageal reflux disease without esophagitis: Secondary | ICD-10-CM | POA: Diagnosis present

## 2015-12-02 DIAGNOSIS — R072 Precordial pain: Secondary | ICD-10-CM | POA: Insufficient documentation

## 2015-12-02 DIAGNOSIS — I25110 Atherosclerotic heart disease of native coronary artery with unstable angina pectoris: Principal | ICD-10-CM | POA: Diagnosis present

## 2015-12-02 DIAGNOSIS — Z955 Presence of coronary angioplasty implant and graft: Secondary | ICD-10-CM

## 2015-12-02 DIAGNOSIS — I252 Old myocardial infarction: Secondary | ICD-10-CM | POA: Diagnosis not present

## 2015-12-02 DIAGNOSIS — I10 Essential (primary) hypertension: Secondary | ICD-10-CM | POA: Diagnosis present

## 2015-12-02 HISTORY — DX: Unstable angina: I20.0

## 2015-12-02 LAB — BASIC METABOLIC PANEL
Anion gap: 14 (ref 5–15)
BUN: 13 mg/dL (ref 6–20)
CO2: 23 mmol/L (ref 22–32)
Calcium: 9.9 mg/dL (ref 8.9–10.3)
Chloride: 104 mmol/L (ref 101–111)
Creatinine, Ser: 1.21 mg/dL (ref 0.61–1.24)
GFR calc Af Amer: >60 mL/min (ref >60)
GFR calc non Af Amer: >60 mL/min (ref >60)
Glucose, Bld: 107 mg/dL — ABNORMAL HIGH (ref 65–99)
Potassium: 3.8 mmol/L (ref 3.5–5.1)
Sodium: 141 mmol/L (ref 135–145)

## 2015-12-02 LAB — TROPONIN I: Troponin I: <0.03 ng/mL (ref <0.031)

## 2015-12-02 LAB — I-STAT TROPONIN, ED: Troponin i, poc: 0 ng/mL (ref 0.00–0.08)

## 2015-12-02 LAB — CBC
HCT: 44.5 % (ref 39.0–52.0)
Hemoglobin: 15.7 g/dL (ref 13.0–17.0)
MCH: 30.3 pg (ref 26.0–34.0)
MCHC: 35.3 g/dL (ref 30.0–36.0)
MCV: 85.9 fL (ref 78.0–100.0)
Platelets: 164 10*3/uL (ref 150–400)
RBC: 5.18 MIL/uL (ref 4.22–5.81)
RDW: 12.3 % (ref 11.5–15.5)
WBC: 5.8 10*3/uL (ref 4.0–10.5)

## 2015-12-02 MED ORDER — SODIUM CHLORIDE 0.9 % WEIGHT BASED INFUSION
3.0000 mL/kg/h | INTRAVENOUS | Status: DC
  Administered 2015-12-03: 3 mL/kg/h via INTRAVENOUS

## 2015-12-02 MED ORDER — ACETAMINOPHEN 500 MG PO TABS
1000.0000 mg | ORAL_TABLET | Freq: Once | ORAL | Status: AC
  Administered 2015-12-02: 1000 mg via ORAL
  Filled 2015-12-02: qty 2

## 2015-12-02 MED ORDER — SODIUM CHLORIDE 0.9% FLUSH: 3.0000 mL | Freq: Two times a day (BID) | INTRAVENOUS | Status: DC

## 2015-12-02 MED ORDER — EZETIMIBE 10 MG PO TABS
10.0000 mg | ORAL_TABLET | Freq: Every day | ORAL | Status: DC
  Filled 2015-12-02: qty 1

## 2015-12-02 MED ORDER — ATENOLOL 25 MG PO TABS
25.0000 mg | ORAL_TABLET | Freq: Every day | ORAL | Status: DC
  Filled 2015-12-02: qty 1

## 2015-12-02 MED ORDER — SODIUM CHLORIDE 0.9% FLUSH: 3.0000 mL | INTRAVENOUS | Status: DC | PRN

## 2015-12-02 MED ORDER — ROSUVASTATIN CALCIUM 10 MG PO TABS: 40.0000 mg | ORAL_TABLET | Freq: Every day | ORAL | Status: DC

## 2015-12-02 MED ORDER — NITROGLYCERIN 0.4 MG SL SUBL: 0.4000 mg | SUBLINGUAL_TABLET | SUBLINGUAL | Status: DC | PRN

## 2015-12-02 MED ORDER — ASPIRIN 81 MG PO CHEW
81.0000 mg | CHEWABLE_TABLET | Freq: Every day | ORAL | Status: DC
  Administered 2015-12-03: 81 mg via ORAL
  Filled 2015-12-02 (×2): qty 1

## 2015-12-02 MED ORDER — NITROGLYCERIN IN D5W 200-5 MCG/ML-% IV SOLN
0.0000 ug/min | INTRAVENOUS | Status: DC
  Administered 2015-12-02: 25 ug/min via INTRAVENOUS
  Filled 2015-12-02: qty 250

## 2015-12-02 MED ORDER — AMLODIPINE BESYLATE 10 MG PO TABS
10.0000 mg | ORAL_TABLET | Freq: Every day | ORAL | Status: DC
  Filled 2015-12-02: qty 1

## 2015-12-02 MED ORDER — CLOPIDOGREL BISULFATE 75 MG PO TABS
75.0000 mg | ORAL_TABLET | Freq: Every day | ORAL | Status: DC
  Administered 2015-12-03: 75 mg via ORAL
  Filled 2015-12-02: qty 1

## 2015-12-02 MED ORDER — ASPIRIN 325 MG PO TABS
325.0000 mg | ORAL_TABLET | Freq: Once | ORAL | Status: AC
  Administered 2015-12-02: 325 mg via ORAL
  Filled 2015-12-02: qty 1

## 2015-12-02 MED ORDER — PANTOPRAZOLE SODIUM 40 MG PO TBEC
40.0000 mg | DELAYED_RELEASE_TABLET | Freq: Every day | ORAL | Status: DC
  Administered 2015-12-03: 40 mg via ORAL
  Filled 2015-12-02: qty 1

## 2015-12-02 MED ORDER — ONDANSETRON HCL 4 MG/2ML IJ SOLN: 4.0000 mg | Freq: Four times a day (QID) | INTRAMUSCULAR | Status: DC | PRN

## 2015-12-02 MED ORDER — HEPARIN BOLUS VIA INFUSION
4000.0000 [IU] | Freq: Once | INTRAVENOUS | Status: AC
  Administered 2015-12-02: 4000 [IU] via INTRAVENOUS
  Filled 2015-12-02: qty 4000

## 2015-12-02 MED ORDER — SODIUM CHLORIDE 0.9 % IV SOLN: 250.0000 mL | INTRAVENOUS | Status: DC | PRN

## 2015-12-02 MED ORDER — ASPIRIN 81 MG PO TABS: 81.0000 mg | ORAL_TABLET | Freq: Every day | ORAL | Status: DC

## 2015-12-02 MED ORDER — RAMIPRIL 5 MG PO CAPS
10.0000 mg | ORAL_CAPSULE | Freq: Two times a day (BID) | ORAL | Status: DC
  Filled 2015-12-02 (×2): qty 2

## 2015-12-02 MED ORDER — SODIUM CHLORIDE 0.9 % WEIGHT BASED INFUSION: 1.0000 mL/kg/h | INTRAVENOUS | Status: DC

## 2015-12-02 MED ORDER — HEPARIN (PORCINE) IN NACL 100-0.45 UNIT/ML-% IJ SOLN
1000.0000 [IU]/h | INTRAMUSCULAR | Status: DC
  Administered 2015-12-02: 1000 [IU]/h via INTRAVENOUS
  Filled 2015-12-02: qty 250

## 2015-12-02 MED ORDER — ACETAMINOPHEN 325 MG PO TABS
650.0000 mg | ORAL_TABLET | ORAL | Status: DC | PRN
  Administered 2015-12-03: 650 mg via ORAL
  Filled 2015-12-02: qty 2

## 2015-12-02 NOTE — Telephone Encounter (Signed)
Called patient about going to ED for his chest pain. Patient agreed to plan and will go to [facility] to be evaluated.

## 2015-12-02 NOTE — H&P (Signed)
History & Physical    Patient ID: [name] MRN: [id number], DOB/AGE: Nov 18, 1950   Admit date: 12/02/2015  Primary Physician: Mauricio Po, [place] Primary Cardiologist: Dr. Angelena Form   History of Present Illness    [name] is a 65 y.o. male with past medical history of CAD (s/p BMS to RCA in 2002), HTN, HLD, Carotid Artery Disease (s/p R CEA in 2003), and OSA who presents to [facility] ED on 12/02/2015 for evaluation of chest pain.  The patient reports having episodes of chest pain for the past 3 days. It is associated with dyspnea and pain radiating down his left arm. He has not noticed an association between the pain occuring at rest or with exertrion. It is relieved with SL NTG. He reports the pain can last for 15 minutes up to 2 hours. He reports it is different from his pain with his previus heart attack, for this is more of a pressure. His previous angina presented with a shooting pain going across his chest and diaphoresis.  The patient denies any chest pain in the past several years. Was last seen by Dr. Angelena Form in 2015 and doing well at that time. Was continued on ASA, Plavix, statin, BB, and Imdur. Has not had a cardiac catheterization or stress test since his cath in 2002.  While in the ED, his initial troponin has been negative. EKG shows no acute ischemic changes. CBC without any abnormalities. Creatinine stable at 1.21.  Past Medical History    Past Medical History  Diagnosis Date  . Coronary artery disease     s/p BMS to RCA 2002; [place] 2008: oLAD 40%, pRCA stent ok with 20%, EF 60%); Myoview scan in March 2011 which was negative for ischemia   . Hypertension   . Hyperlipidemia   . Bradycardia   . Carotid artery disease ([place])     Right CEA;  dopplers 6/12:  0-39% bilat ICA  . GERD (gastroesophageal reflux disease)   . Obstructive sleep apnea     Past Surgical History  Procedure Laterality Date  . Knee arthroscopy      right  . Carotid endarterectomy   06/26/2002    right  . Coronary angioplasty with stent placement  03/08/2000    Bare-metal stent in RCA, in [place]  . Cardiac catheterization  06/01/2007     EF of 65% -- Nonobstructive CAD with 40% ostial stenosis in the LAD, no significant obstruction in the circumflex artery, 20% narrowing within the stent in the proximal to mid right coronary and normal LV function  . Cardiac catheterization  02/02/2006    EF of 50%  . Cardiac catheterization  01/19/2002    EF of 60%  . Mole removal      Prior moles removed     Allergies  Allergies  Allergen Reactions  . Felodipine Swelling  . Niacin     REACTION: not tolerated.     Home Medications    Prior to Admission medications   Medication Sig Start Date End Date Taking? Authorizing Provider  aspirin 81 MG tablet Take 81 mg by mouth daily.     Yes Historical Provider, MD  atenolol (TENORMIN) 50 MG tablet Take one-half tablet by  mouth every day 11/26/15  Yes Burnell Blanks, MD  clopidogrel (PLAVIX) 75 MG tablet Take 1 tablet by mouth  daily 03/19/15  Yes Burnell Blanks, MD  CRESTOR 40 MG tablet Take 1 tablet by mouth  daily 11/26/15  Yes Burnell Blanks, MD  ezetimibe (ZETIA) 10 MG tablet Take 1 tablet (10 mg total) by mouth daily. 09/09/15  Yes Golden Circle, FNP  FOLBIC 2.5-25-2 MG TABS tablet Take 1 tablet by mouth  daily 08/16/15  Yes Burnell Blanks, MD  hydrochlorothiazide (HYDRODIURIL) 50 MG tablet Take one-half tablet by  mouth daily 08/16/15  Yes Burnell Blanks, MD  isosorbide mononitrate (IMDUR) 30 MG 24 hr tablet Take 1 tablet by mouth  daily 10/24/15  Yes Burnell Blanks, MD  meloxicam [facility]) 15 MG tablet Take 1 tablet by mouth  daily 08/05/15  Yes Lyndal Pulley, DO  nitroGLYCERIN (NITROSTAT) 0.4 MG SL tablet Place 1 tablet (0.4 mg total) under the tongue every 5 (five) minutes as needed. 07/17/15  Yes Burnell Blanks, MD  NORVASC 10 MG tablet Take 1 tablet by mouth   daily 11/26/15  Yes Burnell Blanks, MD  potassium chloride (K-DUR) 10 MEQ tablet Take 1 tablet (10 mEq total) by mouth daily. 06/01/14  Yes Burnell Blanks, MD  potassium chloride (K-DUR,KLOR-CON) 10 MEQ tablet Take 1 tablet by mouth  daily 11/26/15  Yes Burnell Blanks, MD  PROTONIX 40 MG tablet Take 1 tablet by mouth  daily 11/26/15  Yes Burnell Blanks, MD  ramipril (ALTACE) 10 MG capsule Take 1 capsule by mouth two times daily 08/16/15  Yes Burnell Blanks, MD    Family History    Family History  Problem Relation Age of Onset  . Heart failure Father   . Peripheral vascular disease Mother     with pacemaker, and had a carotid endarterectomy  . Hypertension Brother   . Hypertension Brother     Social History    Social History   Social History  . Marital Status: Married    Spouse Name: N/A  . Number of Children: 1  . Years of Education: 12   Occupational History  . Sales    Social History Main Topics  . Smoking status: Former Smoker -- 1.00 packs/day for 30 years    Types: Cigarettes    Quit date: 10/19/2000  . Smokeless tobacco: Current User    Types: Snuff  . Alcohol Use: Yes     Comment: Occasional beer  . Drug Use: No  . Sexual Activity: Not on file   Other Topics Concern  . Not on file   Social History Narrative   Fun: [place]     Review of Systems    General:  No chills, fever, night sweats or weight changes.  Cardiovascular:  No edema, orthopnea, palpitations, paroxysmal nocturnal dyspnea. Positive for chest pain and dyspnea on exertion. Dermatological: No rash, lesions/masses Respiratory: No cough, dyspnea Urologic: No hematuria, dysuria Abdominal:   No nausea, vomiting, diarrhea, bright red blood per rectum, melena, or hematemesis Neurologic:  No visual changes, wkns, changes in mental status. All other systems reviewed and are otherwise negative except as noted above.  Physical Exam    Blood pressure 116/84, pulse 50,  temperature 97.8 F (36.6 C), temperature source Oral, resp. rate 17, SpO2 99 %.  General: Well developed, well nourished,male in no acute distress. Head: Normocephalic, atraumatic, sclera non-icteric, no xanthomas, nares are without discharge. Dentition:  Neck: No carotid bruits. JVD not elevated.  Lungs: Respirations regular and unlabored, without wheezes or rales.  Heart: Regular rate and rhythm. No S3 or S4.  No murmur, no rubs, or gallops appreciated. Abdomen: Soft, non-tender, non-distended with normoactive bowel sounds. No  hepatomegaly. No rebound/guarding. No obvious abdominal masses. Msk:  Strength and tone appear normal for age. No joint deformities or effusions. Extremities: No clubbing or cyanosis. No edema.  Distal pedal pulses are 2+ bilaterally. Neuro: Alert and oriented X 3. Moves all extremities spontaneously. No focal deficits noted. Psych:  Responds to questions appropriately with a normal affect. Skin: No rashes or lesions noted  Labs    Troponin (Point of Care Test)  Recent Labs  12/02/15 1314  TROPIPOC 0.00   No results for input(s): CKTOTAL, CKMB, TROPONINI in the last 72 hours. Lab Results  Component Value Date   WBC 5.8 12/02/2015   HGB 15.7 12/02/2015   HCT 44.5 12/02/2015   MCV 85.9 12/02/2015   PLT 164 12/02/2015     Recent Labs Lab 12/02/15 1245  NA 141  K 3.8  CL 104  CO2 23  BUN 13  CREATININE 1.21  CALCIUM 9.9  GLUCOSE 107*   Lab Results  Component Value Date   CHOL 151 09/10/2014   HDL 30.20* 09/10/2014   LDLCALC 102* 09/10/2014   TRIG 92.0 09/10/2014    Radiology Studies    Dg Chest 2 View: 12/02/2015  CLINICAL DATA:  Left chest and arm pain for 1 week. No known injury. Initial encounter. EXAM: CHEST  2 VIEW COMPARISON:  Single view of the chest 08/25/2009 and 11/28/2007. FINDINGS: The lungs are clear. Heart size is normal. No pneumothorax or pleural effusion. No focal bony abnormality. Aortic atherosclerosis is noted.  IMPRESSION: No acute disease. Electronically Signed   By: Inge Rise M.D.   On: 12/02/2015 13:13    EKG & Cardiac Imaging    EKG: Sinus bradycardia, HR 57. No acute ST or T-wave changes.  ECHOCARDIOGRAM: None on file.  Assessment & Plan     1. Unstable angina/ History of CAD -  history of CAD (s/p BMS to RCA in 2002). No ischemic evaluation since. - presents with worsening chest pain over the past 3 days, associated with dyspnea with exertion and pain radiating down his left arm. - initial troponin negative and EKG without acute ischemic changes. Will cycle troponin values. - continue NTG drip, ASA, Plavix, BB, and Zetia. Will initiate Heparin per pharmacy consult. - discussed the patient with Dr. Johnsie Cancel. Will plan for LHC with possible PCI in the morning. NPO after midnight.   2. Hyperlipidemia - continue Zetia. Intolerant to statins.  3. Essential hypertension - BP has been 116/74 - 141/86 while in the ED - will continue home medication regimen  4. Carotid Artery Disease - s/p R CEA in 2003 - continue ASA and Plavix  Signed, Erma Heritage, PA-C 12/02/2015, 6:28 PM Pager: [phone]

## 2015-12-02 NOTE — ED Notes (Signed)
PT states left chest and left arm has been hurting him and he had to take ntg over the weekend.  Pt states he has been having pain in left chest and radiates to left arm and associated SOB.

## 2015-12-02 NOTE — Progress Notes (Signed)
ANTICOAGULATION CONSULT NOTE - Initial Consult  Pharmacy Consult for heparin Indication: chest pain/ACS  Allergies  Allergen Reactions  . Felodipine Swelling  . Niacin     REACTION: not tolerated.    Patient Measurements: Height: 5\' 9"  (175.3 cm) Weight: 187 lb (84.823 kg) IBW/kg (Calculated) : 70.7 Heparin Dosing Weight: 84.8  Vital Signs: Temp: 97.8 F (36.6 C) (02/13 2228) Temp Source: Oral (02/13 2228) BP: 108/65 mmHg (02/13 2228) Pulse Rate: 44 (02/13 2228)  Labs:  Recent Labs  12/02/15 1245  HGB 15.7  HCT 44.5  PLT 164  CREATININE 1.21    Estimated Creatinine Clearance: 62.5 mL/min (by C-G formula based on Cr of 1.21).   Medical History: Past Medical History  Diagnosis Date  . Coronary artery disease     s/p BMS to RCA 2002; [place] 2008: oLAD 40%, pRCA stent ok with 20%, EF 60%); Myoview scan in March 2011 which was negative for ischemia   . Hypertension   . Hyperlipidemia   . Bradycardia   . Carotid artery disease ([place])     Right CEA;  dopplers 6/12:  0-39% bilat ICA  . GERD (gastroesophageal reflux disease)   . Obstructive sleep apnea     Assessment: 77 yoF, pharmacy consulted to dose heparin for ACS/chest pain, plan for cath in the morning History of BMS RCA in 2002  Goal of Therapy:  Heparin level 0.3-0.7 units/ml Monitor platelets by anticoagulation protocol: Yes   Plan:  Give 4000 units bolus x 1 Start heparin infusion at 1000 units/hr Check anti-Xa level in 6 hours and daily while on heparin Continue to monitor H&H and platelets  Thank you for allowing [place] to participate in this patients care. Jens Som, PharmD Pager: [phone]  12/02/2015,10:43 PM

## 2015-12-02 NOTE — Telephone Encounter (Signed)
F/u  Pt daughter calling- stated that pt refuses to go to ED- wants to be seen in office. Please call back and discuss.

## 2015-12-02 NOTE — Progress Notes (Signed)
Patient ID: [name], male   DOB: Mar 09, 1951, 65 y.o.   MRN: [id number] See admitting note 65 y.o. history of BMS to RCA 2002 with normal myovue 2011 New onset SSCP anginal sounding Responding to nitro Now pain free on iv nitro Negative enzymes and no acute ECG changes  Discussed care with daughter ( works for [employer]) and patient Favor diagnostic cath with his primary cardiologist Mcalhany in am Admit to telemetry bed  Exam remarkable for no murmer previous RCEA ( duplex last year plaque no stenosis) Good radial pulse with clear lungs and good distal pulses  Jenkins Rouge

## 2015-12-02 NOTE — Telephone Encounter (Signed)
New Message  Pt c/o of Chest Pain: 1. Are you having CP right now? He just feels tired and weak right now  2. Are you experiencing any other symptoms (ex. SOB, nausea, vomiting, sweating)? No Just left arm pain when he has the chest pain  3. How long have you been experiencing CP? Just over the weekend. He says that he has had it a couple of times over the past couple of weeks  4. Is your CP continuous or coming and going? Coming and going  5. Have you taken Nitroglycerin? Has taken 2 sets of 3 Saturday and a set of 3 on Sunday.  Please call back to discuss.

## 2015-12-02 NOTE — ED Provider Notes (Signed)
CSN: [id number]     Arrival date & time 12/02/15  1220 History   First MD Initiated Contact with Patient 12/02/15 1624     Chief Complaint  Patient presents with  . Chest Pain     (Consider location/radiation/quality/duration/timing/severity/associated sxs/prior Treatment) HPI Complains of left-sided chest pain accompanied by numbness in left arm and left neck onset 2 days ago. Nothing makes symptoms better or worse however he is treated himself with sublingual nitroglycerin tablets 3 tablets on 2 different occasions in the past 2 days with relief. Discomfort is presently mild at left anterior chest. He called his cardiologist earlier today who advised him to come to the emergency department for further evaluation. Presently discomfort is mild. Symptoms nonexertional however feel like "heart pain" he's had in the past. Past Medical History  Diagnosis Date  . Coronary artery disease     s/p BMS to RCA 2002; [place] 2008: oLAD 40%, pRCA stent ok with 20%, EF 60%); Myoview scan in March 2011 which was negative for ischemia   . Hypertension   . Hyperlipidemia   . Bradycardia   . Carotid artery disease ([place])     Right CEA;  dopplers 6/12:  0-39% bilat ICA  . GERD (gastroesophageal reflux disease)   . Obstructive sleep apnea    Past Surgical History  Procedure Laterality Date  . Knee arthroscopy      right  . Carotid endarterectomy  06/26/2002    right  . Coronary angioplasty with stent placement  03/08/2000    Bare-metal stent in RCA, in [place]  . Cardiac catheterization  06/01/2007     EF of 65% -- Nonobstructive CAD with 40% ostial stenosis in the LAD, no significant obstruction in the circumflex artery, 20% narrowing within the stent in the proximal to mid right coronary and normal LV function  . Cardiac catheterization  02/02/2006    EF of 50%  . Cardiac catheterization  01/19/2002    EF of 60%  . Mole removal      Prior moles removed   Family History  Problem Relation Age of  Onset  . Heart failure Father   . Peripheral vascular disease Mother     with pacemaker, and had a carotid endarterectomy  . Hypertension Brother   . Hypertension Brother    Social History  Substance Use Topics  . Smoking status: Former Smoker -- 1.00 packs/day for 30 years    Types: Cigarettes    Quit date: 10/19/2000  . Smokeless tobacco: Current User    Types: Snuff  . Alcohol Use: Yes     Comment: Occasional beer    Review of Systems  Cardiovascular: Positive for chest pain.  Neurological: Positive for numbness.       Numbness left arm and left neck  All other systems reviewed and are negative.     Allergies  Felodipine and Niacin  Home Medications   Prior to Admission medications   Medication Sig Start Date End Date Taking? Authorizing Provider  aspirin 81 MG tablet Take 81 mg by mouth daily.      Historical Provider, MD  atenolol (TENORMIN) 50 MG tablet Take one-half tablet by  mouth every day 11/26/15   Burnell Blanks, MD  clopidogrel (PLAVIX) 75 MG tablet Take 1 tablet by mouth  daily 03/19/15   Burnell Blanks, MD  CRESTOR 40 MG tablet Take 1 tablet by mouth  daily 11/26/15   Burnell Blanks, MD  ezetimibe (ZETIA) 10 MG tablet Take  1 tablet (10 mg total) by mouth daily. 09/09/15   Golden Circle, FNP  FOLBIC 2.5-25-2 MG TABS tablet Take 1 tablet by mouth  daily 08/16/15   Burnell Blanks, MD  hydrochlorothiazide (HYDRODIURIL) 50 MG tablet Take one-half tablet by  mouth daily 08/16/15   Burnell Blanks, MD  isosorbide mononitrate (IMDUR) 30 MG 24 hr tablet Take 1 tablet by mouth  daily 10/24/15   Burnell Blanks, MD  meloxicam [facility] 15 MG tablet Take 1 tablet by mouth  daily 08/05/15   Lyndal Pulley, DO  nitroGLYCERIN (NITROSTAT) 0.4 MG SL tablet Place 1 tablet (0.4 mg total) under the tongue every 5 (five) minutes as needed. 07/17/15   Burnell Blanks, MD  NORVASC 10 MG tablet Take 1 tablet by mouth  daily 11/26/15    Burnell Blanks, MD  potassium chloride (K-DUR) 10 MEQ tablet Take 1 tablet (10 mEq total) by mouth daily. 06/01/14   Burnell Blanks, MD  potassium chloride (K-DUR,KLOR-CON) 10 MEQ tablet Take 1 tablet by mouth  daily 11/26/15   Burnell Blanks, MD  PROTONIX 40 MG tablet Take 1 tablet by mouth  daily 11/26/15   Burnell Blanks, MD  ramipril (ALTACE) 10 MG capsule Take 1 capsule by mouth two times daily 08/16/15   Burnell Blanks, MD   BP 134/76 mmHg  Pulse 52  Temp(Src) 97.8 F (36.6 C) (Oral)  Resp 18  SpO2 97% Physical Exam  Constitutional: He appears well-developed and well-nourished. No distress.  HENT:  Head: Normocephalic and atraumatic.  Eyes: Conjunctivae are normal. Pupils are equal, round, and reactive to light.  Neck: Neck supple. No tracheal deviation present. No thyromegaly present.  Cardiovascular: Regular rhythm.   No murmur heard. Bradycardic  Pulmonary/Chest: Effort normal and breath sounds normal.  Abdominal: Soft. Bowel sounds are normal. He exhibits no distension. There is no tenderness.  Musculoskeletal: Normal range of motion. He exhibits no edema or tenderness.  Neurological: He is alert. Coordination normal.  Skin: Skin is warm and dry. No rash noted.  Psychiatric: He has a normal mood and affect.  Nursing note and vitals reviewed.   ED Course  Procedures (including critical care time) Labs Review Labs Reviewed  BASIC METABOLIC PANEL - Abnormal; Notable for the following:    Glucose, Bld 107 (*)    All other components within normal limits  CBC  I-STAT TROPOININ, ED    Imaging Review Dg Chest 2 View  12/02/2015  CLINICAL DATA:  Left chest and arm pain for 1 week. No known injury. Initial encounter. EXAM: CHEST  2 VIEW COMPARISON:  Single view of the chest 08/25/2009 and 11/28/2007. FINDINGS: The lungs are clear. Heart size is normal. No pneumothorax or pleural effusion. No focal bony abnormality. Aortic atherosclerosis  is noted. IMPRESSION: No acute disease. Electronically Signed   By: Inge Rise M.D.   On: 12/02/2015 13:13   I have personally reviewed and evaluated these images and lab results as part of my medical decision-making.   EKG Interpretation None     ED ECG REPORT   Date: 12/02/2015  Rate: 55  Rhythm: sinus bradycardia  QRS Axis: normal  Intervals: normal  ST/T Wave abnormalities: normal  Conduction Disutrbances:none  Narrative Interpretation:   Old EKG Reviewed: unchanged No significant change from 12/05/2005 I have personally reviewed the EKG tracing and agree with the computerized printout as noted. Chest x-ray viewed by me  6:35 PM patient asymptomatic, pain-free after treatment  with intravenous nitroglycerin drip and oral aspirin Results for orders placed or performed during the hospital encounter of 0000000  Basic metabolic panel  Result Value Ref Range   Sodium 141 135 - 145 mmol/L   Potassium 3.8 3.5 - 5.1 mmol/L   Chloride 104 101 - 111 mmol/L   CO2 23 22 - 32 mmol/L   Glucose, Bld 107 (H) 65 - 99 mg/dL   BUN 13 6 - 20 mg/dL   Creatinine, Ser 1.21 0.61 - 1.24 mg/dL   Calcium 9.9 8.9 - 10.3 mg/dL   GFR calc non Af Amer >60 >60 mL/min   GFR calc Af Amer >60 >60 mL/min   Anion gap 14 5 - 15  CBC  Result Value Ref Range   WBC 5.8 4.0 - 10.5 K/uL   RBC 5.18 4.22 - 5.81 MIL/uL   Hemoglobin 15.7 13.0 - 17.0 g/dL   HCT 44.5 39.0 - 52.0 %   MCV 85.9 78.0 - 100.0 fL   MCH 30.3 26.0 - 34.0 pg   MCHC 35.3 30.0 - 36.0 g/dL   RDW 12.3 11.5 - 15.5 %   Platelets 164 150 - 400 K/uL  I-stat troponin, ED (not at [facility])  Result Value Ref Range   Troponin i, poc 0.00 0.00 - 0.08 ng/mL   Comment 3           Dg Chest 2 View  12/02/2015  CLINICAL DATA:  Left chest and arm pain for 1 week. No known injury. Initial encounter. EXAM: CHEST  2 VIEW COMPARISON:  Single view of the chest 08/25/2009 and 11/28/2007. FINDINGS: The lungs are clear. Heart size is normal. No  pneumothorax or pleural effusion. No focal bony abnormality. Aortic atherosclerosis is noted. IMPRESSION: No acute disease. Electronically Signed   By: Inge Rise M.D.   On: 12/02/2015 13:13    MDM  Intravenous nitroglycerin drip ordered by me as patient having persistent intermittent anginal type pain at rest is taking his Plavix earlier today. Aspirin ordered. Dr Johnsie Cancel from cardiology was consulted and made arrangements for inpatient stay Final diagnoses:  None   diagnosis unstable angina  CRITICAL CARE Performed by: Orlie Dakin Total critical care time: 30 minutes Critical care time was exclusive of separately billable procedures and treating other patients. Critical care was necessary to treat or prevent imminent or life-threatening deterioration. Critical care was time spent personally by me on the following activities: development of treatment plan with patient and/or surrogate as well as nursing, discussions with consultants, evaluation of patient's response to treatment, examination of patient, obtaining history from patient or surrogate, ordering and performing treatments and interventions, ordering and review of laboratory studies, ordering and review of radiographic studies, pulse oximetry and re-evaluation of patient's condition.    Orlie Dakin, MD 12/02/15 [phone]

## 2015-12-02 NOTE — Telephone Encounter (Signed)
Called Crystal Pike (EC) back. Informed her that if patient has taken nitroglycerin and his chest pain did not go away, that he needs to go to ED. She verbalized understanding and will let patient know. Will forward to Dr. Camillia Herter nurse so she is aware.

## 2015-12-03 ENCOUNTER — Encounter (HOSPITAL_COMMUNITY): Payer: Self-pay | Admitting: Cardiovascular Disease

## 2015-12-03 ENCOUNTER — Encounter (HOSPITAL_COMMUNITY)
Admission: EM | Disposition: A | Discharge status: Home / Self Care | Payer: Self-pay | Source: Home / Self Care | Attending: Cardiovascular Disease

## 2015-12-03 DIAGNOSIS — E785 Hyperlipidemia, unspecified: Secondary | ICD-10-CM

## 2015-12-03 DIAGNOSIS — I10 Essential (primary) hypertension: Secondary | ICD-10-CM

## 2015-12-03 DIAGNOSIS — R072 Precordial pain: Secondary | ICD-10-CM | POA: Insufficient documentation

## 2015-12-03 DIAGNOSIS — I25110 Atherosclerotic heart disease of native coronary artery with unstable angina pectoris: Principal | ICD-10-CM

## 2015-12-03 HISTORY — PX: CARDIAC CATHETERIZATION: SHX172

## 2015-12-03 LAB — BASIC METABOLIC PANEL
Anion gap: 15 (ref 5–15)
BUN: 15 mg/dL (ref 6–20)
CO2: 24 mmol/L (ref 22–32)
Calcium: 9.6 mg/dL (ref 8.9–10.3)
Chloride: 101 mmol/L (ref 101–111)
Creatinine, Ser: 1.15 mg/dL (ref 0.61–1.24)
GFR calc Af Amer: >60 mL/min (ref >60)
GFR calc non Af Amer: >60 mL/min (ref >60)
Glucose, Bld: 136 mg/dL — ABNORMAL HIGH (ref 65–99)
Potassium: 3.1 mmol/L — ABNORMAL LOW (ref 3.5–5.1)
Sodium: 140 mmol/L (ref 135–145)

## 2015-12-03 LAB — CBC
HCT: 40.1 % (ref 39.0–52.0)
Hemoglobin: 13.8 g/dL (ref 13.0–17.0)
MCH: 29.5 pg (ref 26.0–34.0)
MCHC: 34.4 g/dL (ref 30.0–36.0)
MCV: 85.7 fL (ref 78.0–100.0)
Platelets: 151 10*3/uL (ref 150–400)
RBC: 4.68 MIL/uL (ref 4.22–5.81)
RDW: 12.4 % (ref 11.5–15.5)
WBC: 5.2 10*3/uL (ref 4.0–10.5)

## 2015-12-03 LAB — PROTIME-INR
INR: 1.12 (ref 0.00–1.49)
Prothrombin Time: 14.6 seconds (ref 11.6–15.2)

## 2015-12-03 LAB — HEPARIN LEVEL (UNFRACTIONATED): Heparin Unfractionated: 0.32 IU/mL (ref 0.30–0.70)

## 2015-12-03 LAB — LIPID PANEL
Cholesterol: 96 mg/dL (ref 0–200)
HDL: 28 mg/dL — ABNORMAL LOW (ref >40)
LDL Cholesterol: 45 mg/dL (ref 0–99)
Total CHOL/HDL Ratio: 3.4 RATIO
Triglycerides: 114 mg/dL (ref <150)
VLDL: 23 mg/dL (ref 0–40)

## 2015-12-03 LAB — TROPONIN I
Troponin I: <0.03 ng/mL (ref <0.031)
Troponin I: <0.03 ng/mL (ref <0.031)

## 2015-12-03 SURGERY — LEFT HEART CATH AND CORONARY ANGIOGRAPHY
Anesthesia: LOCAL

## 2015-12-03 MED ORDER — HEPARIN (PORCINE) IN NACL 2-0.9 UNIT/ML-% IJ SOLN
INTRAMUSCULAR | Status: AC
  Filled 2015-12-03: qty 1000

## 2015-12-03 MED ORDER — MIDAZOLAM HCL 2 MG/2ML IJ SOLN
INTRAMUSCULAR | Status: DC | PRN
  Administered 2015-12-03: 2 mg via INTRAVENOUS

## 2015-12-03 MED ORDER — SODIUM CHLORIDE 0.9% FLUSH: 3.0000 mL | Freq: Two times a day (BID) | INTRAVENOUS | Status: DC

## 2015-12-03 MED ORDER — LIDOCAINE HCL (PF) 1 % IJ SOLN
INTRAMUSCULAR | Status: DC | PRN
Start: 2015-12-03 — End: 2015-12-03
  Administered 2015-12-03: 2 mL via SUBCUTANEOUS

## 2015-12-03 MED ORDER — SODIUM CHLORIDE 0.9% FLUSH: 3.0000 mL | INTRAVENOUS | Status: DC | PRN

## 2015-12-03 MED ORDER — HEPARIN SODIUM (PORCINE) 1000 UNIT/ML IJ SOLN
INTRAMUSCULAR | Status: DC | PRN
  Administered 2015-12-03: 4500 [IU] via INTRAVENOUS

## 2015-12-03 MED ORDER — FENTANYL CITRATE (PF) 100 MCG/2ML IJ SOLN
INTRAMUSCULAR | Status: DC | PRN
  Administered 2015-12-03: 50 ug via INTRAVENOUS

## 2015-12-03 MED ORDER — MIDAZOLAM HCL 2 MG/2ML IJ SOLN
INTRAMUSCULAR | Status: AC
  Filled 2015-12-03: qty 2

## 2015-12-03 MED ORDER — VERAPAMIL HCL 2.5 MG/ML IV SOLN
INTRAVENOUS | Status: DC | PRN
  Administered 2015-12-03: 11:00:00 via INTRA_ARTERIAL

## 2015-12-03 MED ORDER — FENTANYL CITRATE (PF) 100 MCG/2ML IJ SOLN
INTRAMUSCULAR | Status: AC
  Filled 2015-12-03: qty 2

## 2015-12-03 MED ORDER — SODIUM CHLORIDE 0.9 % IV SOLN: 250.0000 mL | INTRAVENOUS | Status: DC | PRN

## 2015-12-03 MED ORDER — IOHEXOL 350 MG/ML SOLN
INTRAVENOUS | Status: DC | PRN
  Administered 2015-12-03: 90 mL via INTRACARDIAC

## 2015-12-03 MED ORDER — LIDOCAINE HCL (PF) 1 % IJ SOLN
INTRAMUSCULAR | Status: AC
  Filled 2015-12-03: qty 30

## 2015-12-03 MED ORDER — HEPARIN SODIUM (PORCINE) 1000 UNIT/ML IJ SOLN
INTRAMUSCULAR | Status: AC
  Filled 2015-12-03: qty 1

## 2015-12-03 MED ORDER — NITROGLYCERIN IN D5W 200-5 MCG/ML-% IV SOLN
INTRAVENOUS | Status: AC
  Administered 2015-12-03: 50000 ug
  Filled 2015-12-03: qty 250

## 2015-12-03 MED ORDER — SODIUM CHLORIDE 0.9 % IV SOLN
INTRAVENOUS | Status: DC
  Administered 2015-12-03: 15:00:00 via INTRAVENOUS

## 2015-12-03 MED ORDER — HEPARIN (PORCINE) IN NACL 2-0.9 UNIT/ML-% IJ SOLN
INTRAMUSCULAR | Status: DC | PRN
  Administered 2015-12-03: 2000 mL

## 2015-12-03 MED ORDER — POTASSIUM CHLORIDE CRYS ER 20 MEQ PO TBCR
40.0000 meq | EXTENDED_RELEASE_TABLET | Freq: Two times a day (BID) | ORAL | Status: DC
  Administered 2015-12-03: 40 meq via ORAL
  Filled 2015-12-03: qty 2

## 2015-12-03 MED ORDER — VERAPAMIL HCL 2.5 MG/ML IV SOLN
INTRAVENOUS | Status: AC
  Filled 2015-12-03: qty 2

## 2015-12-03 SURGICAL SUPPLY — 11 items

## 2015-12-03 NOTE — Discharge Summary (Signed)
Discharge Summary    Patient ID: [name],  MRN: [id number], DOB/AGE: 1951/05/20 65 y.o.  Admit date: 12/02/2015 Discharge date: 12/03/2015  Primary Care Provider: Mauricio Po Primary Cardiologist: Dr. Angelena Form  Discharge Diagnoses    Principal Problem:   Unstable angina [facility]) Active Problems:   Hyperlipidemia   Essential hypertension   Coronary atherosclerosis   Precordial pain   Carotid Artery Disease (s/p R CEA in 2003)   OSA    Hypokalemia  Allergies Allergies  Allergen Reactions  . Felodipine Swelling  . Niacin     REACTION: not tolerated.    Diagnostic Studies/Procedures   Left Heart Cath and Coronary Angiography    Conclusion     Prox RCA to Mid RCA lesion, 30% stenosed. The lesion was previously treated with a bare metal stent greater than two years ago.  Prox RCA lesion, 30% stenosed.  Mid RCA lesion, 30% stenosed.  Dist RCA lesion, 40% stenosed.  Mid Cx lesion, 30% stenosed.  1st Mrg lesion, 30% stenosed.  Prox LAD to Mid LAD lesion, 20% stenosed.  Ost LAD lesion, 30% stenosed.  1st Diag lesion, 20% stenosed.  Dist LAD lesion, 20% stenosed.  The left ventricular systolic function is normal.  Recommendations: Continue medical management of CAD. No further ischemic workup.       History of Present Illness    [name] is a 65 y.o. male with past medical history of CAD (s/p BMS to RCA in 2002), HTN, HLD, Carotid Artery Disease (s/p R CEA in 2003), and OSA who presented to [facility] ED on 12/02/2015 for evaluation of chest pain.  The patient reports having episodes of chest pain for the past 3 days. It is associated with dyspnea and pain radiating down his left arm. He has not noticed an association between the pain occuring at rest or with exertrion. It is relieved with SL NTG. He reports the pain can last for 15 minutes up to 2 hours. He reports it is different from his pain with his previus heart attack, for this is more of a  pressure. His previous angina presented with a shooting pain going across his chest and diaphoresis.  The patient denies any chest pain in the past several years. Was last seen by Dr. Angelena Form in 2015 and doing well at that time. Was continued on ASA, Plavix, statin, BB, and Imdur. Has not had a cardiac catheterization or stress test since his cath in 2002.  While in the ED, his initial troponin has been negative. EKG shows no acute ischemic changes. CBC without any abnormalities. Creatinine stable at 1.21.  Hospital Course     Consultants: None  The patient was admitted with symptoms worrisome for angina. Started on IV heparin and IV nitro. Favored diagnostic cath with his primary cardiologist Mcalhany in am. Troponin x 3 were negative. LDL 45. Given supplement for low potassium. Cath showed patent RCA stent and diffuse disease throughout. Medical therapy recommended. No change in home medications.   The patient has been seen by Dr. Angelena Form  today and deemed ready for discharge home. All follow-up appointments have been scheduled. Discharge medications are listed below.    Discharge Vitals Blood pressure 108/60, pulse 0, temperature 97.6 F (36.4 C), temperature source Oral, resp. rate 0, height 5\' 9"  (1.753 m), weight 188 lb (85.276 kg), SpO2 0 %.  Filed Weights   12/02/15 2228 12/03/15 0500  Weight: 187 lb (84.823 kg) 188 lb (85.276 kg)  Labs & Radiologic Studies     CBC  Recent Labs  12/02/15 1245 12/03/15 0408  WBC 5.8 5.2  HGB 15.7 13.8  HCT 44.5 40.1  MCV 85.9 85.7  PLT 164 [id number]   Basic Metabolic Panel  Recent Labs  12/02/15 1245 12/03/15 0408  NA 141 140  K 3.8 3.1*  CL 104 101  CO2 23 24  GLUCOSE 107* 136*  BUN 13 15  CREATININE 1.21 1.15  CALCIUM 9.9 9.6   Liver Function Tests No results for input(s): AST, ALT, ALKPHOS, BILITOT, PROT, ALBUMIN in the last 72 hours. No results for input(s): LIPASE, AMYLASE in the last 72 hours. Cardiac  Enzymes  Recent Labs  12/02/15 2255 12/03/15 0408 12/03/15 1020  TROPONINI <0.03 <0.03 <0.03   BNP Invalid input(s): POCBNP D-Dimer No results for input(s): DDIMER in the last 72 hours. Hemoglobin A1C No results for input(s): HGBA1C in the last 72 hours. Fasting Lipid Panel  Recent Labs  12/03/15 0408  CHOL 96  HDL 28*  LDLCALC 45  TRIG 114  CHOLHDL 3.4   Thyroid Function Tests No results for input(s): TSH, T4TOTAL, T3FREE, THYROIDAB in the last 72 hours.  Invalid input(s): FREET3  Dg Chest 2 View  12/02/2015  CLINICAL DATA:  Left chest and arm pain for 1 week. No known injury. Initial encounter. EXAM: CHEST  2 VIEW COMPARISON:  Single view of the chest 08/25/2009 and 11/28/2007. FINDINGS: The lungs are clear. Heart size is normal. No pneumothorax or pleural effusion. No focal bony abnormality. Aortic atherosclerosis is noted. IMPRESSION: No acute disease. Electronically Signed   By: Inge Rise M.D.   On: 12/02/2015 13:13    Disposition   Pt is being discharged home today in good condition.  Follow-up Plans & Appointments    Follow-up Information    Follow up with Ermalinda Barrios, PA-C. Go on 12/30/2015.   Specialty:  Cardiology   Why:  @2 :15pm for post hospital   Contact information:   [address] [place] [address] [phone]      Discharge Instructions    Diet - low sodium heart healthy    Complete by:  As directed      Discharge instructions    Complete by:  As directed   No driving for 48 hours. No lifting over 5 lbs for 1 week. No sexual activity for 1 week. You may return to work on 12/06/15 Keep procedure site clean & dry. If you notice increased pain, swelling, bleeding or pus, call/return!  You may shower, but no soaking baths/hot tubs/pools for 1 week.     Increase activity slowly    Complete by:  As directed            Discharge Medications   Current Discharge Medication List    CONTINUE these medications which  have NOT CHANGED   Details  aspirin 81 MG tablet Take 81 mg by mouth daily.      atenolol (TENORMIN) 50 MG tablet Take one-half tablet by  mouth every day Qty: 15 tablet, Refills: 0    clopidogrel (PLAVIX) 75 MG tablet Take 1 tablet by mouth  daily Qty: 90 tablet, Refills: 1    CRESTOR 40 MG tablet Take 1 tablet by mouth  daily Qty: 30 tablet, Refills: 0    ezetimibe (ZETIA) 10 MG tablet Take 1 tablet (10 mg total) by mouth daily. Qty: 90 tablet, Refills: 3   Associated Diagnoses: Hyperlipidemia; Atherosclerosis of native coronary artery of  native heart without angina pectoris    FOLBIC 2.5-25-2 MG TABS tablet Take 1 tablet by mouth  daily Qty: 90 tablet, Refills: 0    hydrochlorothiazide (HYDRODIURIL) 50 MG tablet Take one-half tablet by  mouth daily Qty: 45 tablet, Refills: 0    isosorbide mononitrate (IMDUR) 30 MG 24 hr tablet Take 1 tablet by mouth  daily Qty: 90 tablet, Refills: 0    meloxicam (MOBIC) 15 MG tablet Take 1 tablet by mouth  daily Qty: 90 tablet, Refills: 0    nitroGLYCERIN (NITROSTAT) 0.4 MG SL tablet Place 1 tablet (0.4 mg total) under the tongue every 5 (five) minutes as needed. Qty: 25 tablet, Refills: 3    NORVASC 10 MG tablet Take 1 tablet by mouth  daily Qty: 30 tablet, Refills: 0    potassium chloride (K-DUR) 10 MEQ tablet Take 1 tablet (10 mEq total) by mouth daily. Qty: 90 tablet, Refills: 3    potassium chloride (K-DUR,KLOR-CON) 10 MEQ tablet Take 1 tablet by mouth  daily Qty: 30 tablet, Refills: 0    PROTONIX 40 MG tablet Take 1 tablet by mouth  daily Qty: 30 tablet, Refills: 0    ramipril (ALTACE) 10 MG capsule Take 1 capsule by mouth two times daily Qty: 180 capsule, Refills: 0           Outstanding Labs/Studies   None  Duration of Discharge Encounter   Greater than 30 minutes including physician time.  Signed, Ellia Knowlton PA-C 12/03/2015, 1:31 PM

## 2015-12-03 NOTE — H&P (View-Only) (Signed)
Patient ID: TALION LOWRIE, male   DOB: 08/31/1951, 65 y.o.   MRN: [id number]    Subjective:  Mild pain this am   Objective:  Filed Vitals:   12/03/15 0233 12/03/15 0332 12/03/15 0500 12/03/15 0645  BP: 111/68 108/70 106/64 101/63  Pulse:   57   Temp:   97.6 F (36.4 C)   TempSrc:   Oral   Resp:      Height:      Weight:   85.276 kg (188 lb)   SpO2:   97%     Intake/Output from previous day:  Intake/Output Summary (Last 24 hours) at 12/03/15 [id number] Last data filed at 12/03/15 0800  Gross per 24 hour  Intake    720 ml  Output    400 ml  Net    320 ml    Physical Exam: Affect appropriate Healthy:  appears stated age HEENT: normal Neck supple with no adenopathy JVP normal no bruits no thyromegaly Lungs clear with no wheezing and good diaphragmatic motion Heart:  S1/S2 no murmur, no rub, gallop or click PMI normal Abdomen: benighn, BS positve, no tenderness, no AAA no bruit.  No HSM or HJR Distal pulses intact with no bruits No edema Neuro non-focal Skin warm and dry No muscular weakness   Lab Results: Basic Metabolic Panel:  Recent Labs  12/02/15 1245 12/03/15 0408  NA 141 140  K 3.8 3.1*  CL 104 101  CO2 23 24  GLUCOSE 107* 136*  BUN 13 15  CREATININE 1.21 1.15  CALCIUM 9.9 9.6   CBC:  Recent Labs  12/02/15 1245 12/03/15 0408  WBC 5.8 5.2  HGB 15.7 13.8  HCT 44.5 40.1  MCV 85.9 85.7  PLT 164 151   Cardiac Enzymes:  Recent Labs  12/02/15 2255 12/03/15 0408  TROPONINI <0.03 <0.03   Fasting Lipid Panel:  Recent Labs  12/03/15 0408  CHOL 96  HDL 28*  LDLCALC 45  TRIG 114  CHOLHDL 3.4    Imaging: Dg Chest 2 View  12/02/2015  CLINICAL DATA:  Left chest and arm pain for 1 week. No known injury. Initial encounter. EXAM: CHEST  2 VIEW COMPARISON:  Single view of the chest 08/25/2009 and 11/28/2007. FINDINGS: The lungs are clear. Heart size is normal. No pneumothorax or pleural effusion. No focal bony abnormality. Aortic atherosclerosis  is noted. IMPRESSION: No acute disease. Electronically Signed   By: Inge Rise M.D.   On: 12/02/2015 13:13    Cardiac Studies:  ECG:  Orders placed or performed during the hospital encounter of 12/02/15  . EKG 12-Lead  . EKG 12-Lead  . ED EKG within 10 minutes  . ED EKG within 10 minutes  . EKG 12-lead  . EKG 12-Lead  . EKG 12-Lead     Telemetry:  NSR no arrythmia  Echo:   Medications:   . amLODipine  10 mg Oral Daily  . aspirin  81 mg Oral Daily  . atenolol  25 mg Oral Daily  . clopidogrel  75 mg Oral Daily  . ezetimibe  10 mg Oral Daily  . pantoprazole  40 mg Oral Daily  . potassium chloride  40 mEq Oral BID  . ramipril  10 mg Oral BID  . rosuvastatin  40 mg Oral q1800  . sodium chloride flush  3 mL Intravenous Q12H     . sodium chloride 1 mL/kg/hr (12/03/15 0700)  . heparin 1,000 Units/hr (12/02/15 2309)  . nitroGLYCERIN 25 mcg/min (12/02/15 1721)  Assessment/Plan:   HTN:  Well controlled.  Continue current medications and low sodium Dash type diet.   Chol:  On statin and zetia  LDL 45 at goal Chest Pain: worrisome Enzymes negative on nitro and heparin BMS to RCA 2002  Cath today with CM  Jenkins Rouge 12/03/2015, 8:52 AM

## 2015-12-03 NOTE — Progress Notes (Signed)
Patient ID: [name], male   DOB: 05-24-1951, 65 y.o.   MRN: [id number]    Subjective:  Mild pain this am   Objective:  Filed Vitals:   12/03/15 0233 12/03/15 0332 12/03/15 0500 12/03/15 0645  BP: 111/68 108/70 106/64 101/63  Pulse:   57   Temp:   97.6 F (36.4 C)   TempSrc:   Oral   Resp:      Height:      Weight:   85.276 kg (188 lb)   SpO2:   97%     Intake/Output from previous day:  Intake/Output Summary (Last 24 hours) at 12/03/15 [id number] Last data filed at 12/03/15 0800  Gross per 24 hour  Intake    720 ml  Output    400 ml  Net    320 ml    Physical Exam: Affect appropriate Healthy:  appears stated age HEENT: normal Neck supple with no adenopathy JVP normal no bruits no thyromegaly Lungs clear with no wheezing and good diaphragmatic motion Heart:  S1/S2 no murmur, no rub, gallop or click PMI normal Abdomen: benighn, BS positve, no tenderness, no AAA no bruit.  No HSM or HJR Distal pulses intact with no bruits No edema Neuro non-focal Skin warm and dry No muscular weakness   Lab Results: Basic Metabolic Panel:  Recent Labs  12/02/15 1245 12/03/15 0408  NA 141 140  K 3.8 3.1*  CL 104 101  CO2 23 24  GLUCOSE 107* 136*  BUN 13 15  CREATININE 1.21 1.15  CALCIUM 9.9 9.6   CBC:  Recent Labs  12/02/15 1245 12/03/15 0408  WBC 5.8 5.2  HGB 15.7 13.8  HCT 44.5 40.1  MCV 85.9 85.7  PLT 164 151   Cardiac Enzymes:  Recent Labs  12/02/15 2255 12/03/15 0408  TROPONINI <0.03 <0.03   Fasting Lipid Panel:  Recent Labs  12/03/15 0408  CHOL 96  HDL 28*  LDLCALC 45  TRIG 114  CHOLHDL 3.4    Imaging: Dg Chest 2 View  12/02/2015  CLINICAL DATA:  Left chest and arm pain for 1 week. No known injury. Initial encounter. EXAM: CHEST  2 VIEW COMPARISON:  Single view of the chest 08/25/2009 and 11/28/2007. FINDINGS: The lungs are clear. Heart size is normal. No pneumothorax or pleural effusion. No focal bony abnormality. Aortic atherosclerosis  is noted. IMPRESSION: No acute disease. Electronically Signed   By: Inge Rise M.D.   On: 12/02/2015 13:13    Cardiac Studies:  ECG:  Orders placed or performed during the hospital encounter of 12/02/15  . EKG 12-Lead  . EKG 12-Lead  . ED EKG within 10 minutes  . ED EKG within 10 minutes  . EKG 12-lead  . EKG 12-Lead  . EKG 12-Lead     Telemetry:  NSR no arrythmia  Echo:   Medications:   . amLODipine  10 mg Oral Daily  . aspirin  81 mg Oral Daily  . atenolol  25 mg Oral Daily  . clopidogrel  75 mg Oral Daily  . ezetimibe  10 mg Oral Daily  . pantoprazole  40 mg Oral Daily  . potassium chloride  40 mEq Oral BID  . ramipril  10 mg Oral BID  . rosuvastatin  40 mg Oral q1800  . sodium chloride flush  3 mL Intravenous Q12H     . sodium chloride 1 mL/kg/hr (12/03/15 0700)  . heparin 1,000 Units/hr (12/02/15 2309)  . nitroGLYCERIN 25 mcg/min (12/02/15 1721)  Assessment/Plan:   HTN:  Well controlled.  Continue current medications and low sodium Dash type diet.   Chol:  On statin and zetia  LDL 45 at goal Chest Pain: worrisome Enzymes negative on nitro and heparin BMS to RCA 2002  Cath today with CM  Jenkins Rouge 12/03/2015, 8:52 AM

## 2015-12-03 NOTE — Interval H&P Note (Signed)
History and Physical Interval Note:  12/03/2015 10:19 AM  [name]  has presented today for cardiac cath with the diagnosis of unstable angina  The various methods of treatment have been discussed with the patient and family. After consideration of risks, benefits and other options for treatment, the patient has consented to  Procedure(s): Left Heart Cath and Coronary Angiography (N/A) as a surgical intervention .  The patient's history has been reviewed, patient examined, no change in status, stable for surgery.  I have reviewed the patient's chart and labs.  Questions were answered to the patient's satisfaction.    Cath Lab Visit (complete for each Cath Lab visit)  Clinical Evaluation Leading to the Procedure:   ACS: No.  Non-ACS:    Anginal Classification: CCS III  Anti-ischemic medical therapy: Maximal Therapy (2 or more classes of medications)  Non-Invasive Test Results: No non-invasive testing performed  Prior CABG: No previous CABG         Rande Dario

## 2015-12-03 NOTE — Progress Notes (Signed)
ANTICOAGULATION CONSULT NOTE - Follow-up Consult  Pharmacy Consult for heparin Indication: chest pain/ACS  Allergies  Allergen Reactions  . Felodipine Swelling  . Niacin     REACTION: not tolerated.    Patient Measurements: Height: 5\' 9"  (175.3 cm) Weight: 187 lb (84.823 kg) IBW/kg (Calculated) : 70.7 Heparin Dosing Weight: 84.8  Vital Signs: Temp: 97.7 F (36.5 C) (02/14 0019) Temp Source: Oral (02/14 0019) BP: 108/70 mmHg (02/14 0332) Pulse Rate: 58 (02/14 0019)  Labs:  Recent Labs  12/02/15 1245 12/02/15 2255 12/03/15 0408  HGB 15.7  --  13.8  HCT 44.5  --  40.1  PLT 164  --  151  LABPROT  --   --  14.6  INR  --   --  1.12  HEPARINUNFRC  --   --  0.32  CREATININE 1.21  --   --   TROPONINI  --  <0.03  --     Estimated Creatinine Clearance: 62.5 mL/min (by C-G formula based on Cr of 1.21).   Assessment: 65 yoF on heparin for ACS/chest pain. Heparin level therapeutic on 1000 units/hr. Hgb trending down but still wnl. Plan for cath today.  Goal of Therapy:  Heparin level 0.3-0.7 units/ml Monitor platelets by anticoagulation protocol: Yes   Plan:  Continue heparin at 1000 units/hr Will f/u post cath  Sherlon Handing, PharmD, BCPS Clinical pharmacist, pager [phone] 12/03/2015,5:40 AM

## 2015-12-05 ENCOUNTER — Ambulatory Visit (INDEPENDENT_AMBULATORY_CARE_PROVIDER_SITE_OTHER): Payer: Managed Care, Other (non HMO) | Admitting: Pulmonary Disease

## 2015-12-05 ENCOUNTER — Encounter: Payer: Self-pay | Admitting: Pulmonary Disease

## 2015-12-05 VITALS — BP 128/72 | HR 65 | Ht 69.0 in | Wt 194.4 lb

## 2015-12-05 DIAGNOSIS — G4733 Obstructive sleep apnea (adult) (pediatric): Secondary | ICD-10-CM

## 2015-12-05 DIAGNOSIS — R0602 Shortness of breath: Secondary | ICD-10-CM | POA: Insufficient documentation

## 2015-12-05 DIAGNOSIS — G4710 Hypersomnia, unspecified: Secondary | ICD-10-CM

## 2015-12-05 DIAGNOSIS — G4730 Sleep apnea, unspecified: Secondary | ICD-10-CM | POA: Diagnosis not present

## 2015-12-05 HISTORY — DX: Hypersomnia, unspecified: G47.10

## 2015-12-05 NOTE — Assessment & Plan Note (Signed)
35 pack year smoking history. Quit when he was in his 61s. Recent dyspnea. Still able to do ADLs and work. Sees cardiology. Recent cath. --May have underlying COPD. --May need PFTs. --Check chest x-ray first.

## 2015-12-05 NOTE — Assessment & Plan Note (Signed)
Recent hypersomnia. CPAP machine is old. --Plan to get a new CPAP machine. --We'll observe hypersomnia after the new CPAP machine and download.

## 2015-12-05 NOTE — Assessment & Plan Note (Signed)
Patient has obstructive sleep apnea. He significantly improved with CPAP machine but through the years, the machine has lost its luster. Machine is starting to malfunction. Couple months ago, it stopped working. It's working some now but there are nights that he feels the pressure is not enough. He has an 63-year-old machine. He has not received supplies in years. He drives out of state and basis CPAP machine with him. --Plan to get an auto CPAP 5-15 cm water. He will need a mask fitting session. --Not sure if he can get a new machine without the sleep study. He probably had a different insurance in 2009. If need be, he will get a home sleep study. Might be difficult to schedule as he drives out of state. --in The meantime, continue using current CPAP machine. -- Advised not to drive when sleepy. Advised not to engage in activities requiring vigilance when he sleepy. --We'll need to see him 30 days after getting his use CPAP machine. Patient will call to make that appointment.

## 2015-12-05 NOTE — Patient Instructions (Signed)
  You are diagnosed with Obstructive Sleep Apnea or OSA.   We will order you a CPAP   machine.  Please call the office if you do NOT receive your machine in the next 1-2 weeks.   Please make sure you use your CPAP device everytime you sleep.  We will monitor the usage of your machine per your insurance requirement.  Your insurance company may take the machine from you if you are not using it regularly.   Please clean the mask, tubings, filter, water reservoir with soapy water every week.  Please use distilled water for the water reservoir.   Please call the office or your machine provider (DME company) if you are having issues with the device.    Return to clinic in  30 days after you get your CPAP machine.    Elsie Saas A. Corrie Dandy, MD Pulmonary and [place] [phone], Fax: [phone]

## 2015-12-05 NOTE — Progress Notes (Signed)
Subjective:    Patient ID: [name], male    DOB: [birth year], 65 y.o.   MRN: [id number]  HPI   This is the case of [name], 65 y.o. Male, who was referred by Mauricio Po FNP  in consultation regarding OSA.  As you very well know, patient is known to have obstructive sleep apnea. He had a study on 02/04/2008. Sleep index was elevated at 6. He had a 2 week auto CPAP. Based on that, optimal pressure for him was CPAP 11 cm water. He was started on CPAP in October 2009. He feels benefit of using CPAP. More energy, less sleepiness, significantly improved.  Through the years, his machine has lost its luster. Several months ago, machine just stopped working. He tried to fix it. It started to work again but sometimes, he feels, depression is not enough. The machine is malfunctioning. He has had the machine since 2009. He also has been using the same mass, tubings, filter for several years now. Recent hypersomnia, fatigue, sleepiness. These affect his functionality. He drives out of state for work.    Review of Systems  Constitutional: Negative.  Negative for fever and unexpected weight change.  HENT: Negative.  Negative for congestion, dental problem, ear pain, nosebleeds, postnasal drip, rhinorrhea, sinus pressure, sneezing, sore throat and trouble swallowing.   Eyes: Negative.  Negative for redness and itching.  Respiratory: Positive for shortness of breath. Negative for cough, chest tightness and wheezing.   Cardiovascular: Negative.  Negative for palpitations and leg swelling.  Gastrointestinal: Negative.  Negative for nausea and vomiting.  Endocrine: Negative.   Genitourinary: Negative.  Negative for dysuria.  Musculoskeletal: Negative.  Negative for joint swelling.  Skin: Negative.  Negative for rash.  Allergic/Immunologic: Negative.   Neurological: Negative.  Negative for headaches.  Hematological: Negative.  Does not bruise/bleed easily.  Psychiatric/Behavioral: Negative.   Negative for dysphoric mood. The patient is not nervous/anxious.   All other systems reviewed and are negative.  Past Medical History  Diagnosis Date  . Coronary artery disease     s/p BMS to RCA 2002; [place] 2008: oLAD 40%, pRCA stent ok with 20%, EF 60%); Myoview scan in March 2011 which was negative for ischemia   . Hypertension   . Hyperlipidemia   . Bradycardia   . Carotid artery disease ([place])     Right CEA;  dopplers 6/12:  0-39% bilat ICA  . GERD (gastroesophageal reflux disease)   . Obstructive sleep apnea    (-) CA, DVT, asthma, COPD.   Family History  Problem Relation Age of Onset  . Heart failure Father   . Peripheral vascular disease Mother     with pacemaker, and had a carotid endarterectomy  . Hypertension Brother   . Hypertension Brother      Past Surgical History  Procedure Laterality Date  . Knee arthroscopy      right  . Carotid endarterectomy  06/26/2002    right  . Coronary angioplasty with stent placement  03/08/2000    Bare-metal stent in RCA, in [place]  . Cardiac catheterization  06/01/2007     EF of 65% -- Nonobstructive CAD with 40% ostial stenosis in the LAD, no significant obstruction in the circumflex artery, 20% narrowing within the stent in the proximal to mid right coronary and normal LV function  . Cardiac catheterization  02/02/2006    EF of 50%  . Cardiac catheterization  01/19/2002    EF of 60%  . Mole  removal      Prior moles removed  . Cardiac catheterization N/A 12/03/2015    Procedure: Left Heart Cath and Coronary Angiography;  Surgeon: Burnell Blanks, MD;  Location: [facility];  Service: Cardiovascular;  Laterality: N/A;    Social History   Social History  . Marital Status: Married    Spouse Name: N/A  . Number of Children: 1  . Years of Education: 12   Occupational History  . Sales    Social History Main Topics  . Smoking status: Former Smoker -- 1.00 packs/day for 30 years    Types: Cigarettes    Quit  date: 10/19/2000  . Smokeless tobacco: Current User    Types: Snuff  . Alcohol Use: Yes     Comment: Occasional beer  . Drug Use: No  . Sexual Activity: Not on file   Other Topics Concern  . Not on file   Social History Narrative   Fun: [place]   Married with 3 children. 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  50 PY -- quit when he was 74. Occasional ETOH.    Allergies  Allergen Reactions  . Felodipine Swelling  . Niacin     REACTION: not tolerated.     Outpatient Prescriptions Prior to Visit  Medication Sig Dispense Refill  . aspirin 81 MG tablet Take 81 mg by mouth daily.      Marland Kitchen atenolol (TENORMIN) 50 MG tablet Take one-half tablet by  mouth every day 15 tablet 0  . clopidogrel (PLAVIX) 75 MG tablet Take 1 tablet by mouth  daily 90 tablet 1  . CRESTOR 40 MG tablet Take 1 tablet by mouth  daily 30 tablet 0  . ezetimibe (ZETIA) 10 MG tablet Take 1 tablet (10 mg total) by mouth daily. 90 tablet 3  . FOLBIC 2.5-25-2 MG TABS tablet Take 1 tablet by mouth  daily 90 tablet 0  . hydrochlorothiazide (HYDRODIURIL) 50 MG tablet Take one-half tablet by  mouth daily 45 tablet 0  . isosorbide mononitrate (IMDUR) 30 MG 24 hr tablet Take 1 tablet by mouth  daily 90 tablet 0  . meloxicam (MOBIC) 15 MG tablet Take 1 tablet by mouth  daily 90 tablet 0  . nitroGLYCERIN (NITROSTAT) 0.4 MG SL tablet Place 1 tablet (0.4 mg total) under the tongue every 5 (five) minutes as needed. 25 tablet 3  . potassium chloride (K-DUR) 10 MEQ tablet Take 1 tablet (10 mEq total) by mouth daily. 90 tablet 3  . PROTONIX 40 MG tablet Take 1 tablet by mouth  daily 30 tablet 0  . ramipril (ALTACE) 10 MG capsule Take 1 capsule by mouth two times daily 180 capsule 0  . NORVASC 10 MG tablet Take 1 tablet by mouth  daily (Patient not taking: Reported on 12/05/2015) 30 tablet 0  . potassium chloride (K-DUR,KLOR-CON) 10 MEQ tablet Take 1 tablet by mouth  daily 30 tablet 0   No facility-administered  medications prior to visit.   No orders of the defined types were placed in this encounter.         Objective:   Physical Exam  Vitals:  Filed Vitals:   12/05/15 1530  BP: 128/72  Pulse: 65  Height: 5\' 9"  (1.753 m)  Weight: 194 lb 6.4 oz (88.179 kg)  SpO2: 96%    Constitutional/General:  Pleasant, well-nourished, well-developed, not in any distress,  Comfortably seating.  Well kempt  Body mass index is 28.69 kg/(m^2).  Wt Readings from Last 3 Encounters:  12/05/15 194 lb 6.4 oz (88.179 kg)  12/03/15 188 lb (85.276 kg)  09/09/15 199 lb (90.266 kg)    Neck circumference: 17 inches  HEENT: Pupils equal and reactive to light and accommodation. Anicteric sclerae. Normal nasal mucosa.   No oral  lesions,  mouth clear,  oropharynx clear, no postnasal drip. (-) Oral thrush. No dental caries.  Airway - Mallampati class III  Neck: No masses. Midline trachea. No JVD, (-) LAD. (-) bruits appreciated.  Respiratory/Chest: Grossly normal chest. (-) deformity. (-) Accessory muscle use.  Symmetric expansion. (-) Tenderness on palpation.  Resonant on percussion.  Diminished BS on both lower lung zones. (-) wheezing, crackles, rhonchi (-) egophony  Cardiovascular: Regular rate and  rhythm, heart sounds normal, no murmur or gallops, no peripheral edema  Gastrointestinal:  Normal bowel sounds. Soft, non-tender. No hepatosplenomegaly.  (-) masses.   Musculoskeletal:  Normal muscle tone. Normal gait.   Extremities: Grossly normal. (-) clubbing, cyanosis.  (-) edema  Skin: (-) rash,lesions seen.   Neurological/Psychiatric : alert, oriented to time, place, person. Normal mood and affect            Assessment & Plan:  Obstructive sleep apnea Patient has obstructive sleep apnea. He significantly improved with CPAP machine but through the years, the machine has lost its luster. Machine is starting to malfunction. Couple months ago, it stopped working. It's working some now  but there are nights that he feels the pressure is not enough. He has an 34-year-old machine. He has not received supplies in years. He drives out of state and basis CPAP machine with him. --Plan to get an auto CPAP 5-15 cm water. He will need a mask fitting session. --Not sure if he can get a new machine without the sleep study. He probably had a different insurance in 2009. If need be, he will get a home sleep study. Might be difficult to schedule as he drives out of state. --in The meantime, continue using current CPAP machine. -- Advised not to drive when sleepy. Advised not to engage in activities requiring vigilance when he sleepy. --We'll need to see him 30 days after getting his use CPAP machine. Patient will call to make that appointment.  Shortness of breath 35 pack year smoking history. Quit when he was in his 25s. Recent dyspnea. Still able to do ADLs and work. Sees cardiology. Recent cath. --May have underlying COPD. --May need PFTs. --Check chest x-ray first.  Hypersomnia Recent hypersomnia. CPAP machine is old. --Plan to get a new CPAP machine. --We'll observe hypersomnia after the new CPAP machine and download.    Thank you very much for letting me participate in this patient's care. Please do not hesitate to give me a call if you have any questions or concerns regarding the treatment plan.   Patient will follow up with me in 6 weeks    J. Shirl Harris, MD Pulmonary and [place] Pager: [phone] Office: [phone], Fax: [phone]

## 2015-12-09 ENCOUNTER — Telehealth: Payer: Self-pay | Admitting: Pulmonary Disease

## 2015-12-09 DIAGNOSIS — G4733 Obstructive sleep apnea (adult) (pediatric): Secondary | ICD-10-CM

## 2015-12-09 NOTE — Telephone Encounter (Signed)
[phone]. Pt daughter cb

## 2015-12-09 NOTE — Telephone Encounter (Signed)
LMOMTCB x1 for [employer]

## 2015-12-09 NOTE — Telephone Encounter (Signed)
Called and spoke with the patient' daughter [name]. She states that the patient and herself is not happy with the service they have received with [facility] and would like to discuss switching to [place]. I explained to her that I would need to contact [employer] to make sure they are eligable to switch DME's at this time. I informed her that due to [facility] hours of business I would try to reach someone but it might be tomorrow before we receive a answer. She voiced understanding and had no further questions.   Called [facility] and spoke with [place] with the answering service. I LM with him to have [place] return our call. Will await call back.

## 2015-12-10 NOTE — Telephone Encounter (Signed)
Spoke with [name] at [facility], states that she will forward this message to the referral coordinators who would be able to better answer if the pt is able to transfer care to another DME.  Will await call back.

## 2015-12-11 ENCOUNTER — Telehealth: Payer: Self-pay | Admitting: Cardiovascular Disease

## 2015-12-11 ENCOUNTER — Telehealth: Payer: Self-pay | Admitting: *Deleted

## 2015-12-11 DIAGNOSIS — E785 Hyperlipidemia, unspecified: Secondary | ICD-10-CM

## 2015-12-11 DIAGNOSIS — I2510 Atherosclerotic heart disease of native coronary artery without angina pectoris: Secondary | ICD-10-CM

## 2015-12-11 MED ORDER — CLOPIDOGREL BISULFATE 75 MG PO TABS: ORAL_TABLET | ORAL | Status: DC

## 2015-12-11 MED ORDER — PANTOPRAZOLE SODIUM 40 MG PO TBEC: DELAYED_RELEASE_TABLET | ORAL | Status: DC

## 2015-12-11 MED ORDER — ATENOLOL 50 MG PO TABS: ORAL_TABLET | ORAL | Status: DC

## 2015-12-11 MED ORDER — HYDROCHLOROTHIAZIDE 50 MG PO TABS: ORAL_TABLET | ORAL | Status: DC

## 2015-12-11 MED ORDER — RAMIPRIL 10 MG PO CAPS: ORAL_CAPSULE | ORAL | Status: DC

## 2015-12-11 MED ORDER — EZETIMIBE 10 MG PO TABS: 10.0000 mg | ORAL_TABLET | Freq: Every day | ORAL | Status: DC

## 2015-12-11 MED ORDER — ISOSORBIDE MONONITRATE ER 30 MG PO TB24: ORAL_TABLET | ORAL | Status: DC

## 2015-12-11 MED ORDER — ROSUVASTATIN CALCIUM 40 MG PO TABS: ORAL_TABLET | ORAL | Status: DC

## 2015-12-11 MED ORDER — POTASSIUM CHLORIDE ER 10 MEQ PO TBCR: 10.0000 meq | EXTENDED_RELEASE_TABLET | Freq: Every day | ORAL | Status: DC

## 2015-12-11 NOTE — Telephone Encounter (Signed)
Crystal Ladean Raya (daughter)- has not heard anything yet from [facility], and would like to know the status of this.  Call back at [phone]

## 2015-12-11 NOTE — Telephone Encounter (Signed)
Called spoke with Crystal. She is aware will place an order to change DME's. Aware they may not be able but we can try. Order placed. Nothing further needed

## 2015-12-11 NOTE — Telephone Encounter (Signed)
Received call from pt mom she states son insurance change and [facility] are [facility] now he will be needing new prescriptions to be sent. Inform mom per chart we can send his Zetia, all other meds will have to come from cardiologist Dr. Angelena Form gave her his # to contact for refills. Sent zetia...Johny Chess

## 2015-12-11 NOTE — Telephone Encounter (Signed)
New message       *STAT* If patient is at the pharmacy, call can be transferred to refill team.   1. Which medications need to be refilled? (please list name of each medication and dose if known) atenolol 50mg , generic plavix, crestor 40mg , HCTZ 50mg , isosorbide 30mg , potassium, ramipril 10mg  2. Which pharmacy/location (including street and city if local pharmacy) is medication to be sent to? [facility]new pharmacy  3. Do they need a 30 day or 90 day supply? 90 day supply

## 2015-12-18 ENCOUNTER — Other Ambulatory Visit: Payer: Self-pay | Admitting: *Deleted

## 2015-12-18 MED ORDER — POTASSIUM CHLORIDE ER 10 MEQ PO TBCR: 10.0000 meq | EXTENDED_RELEASE_TABLET | Freq: Every day | ORAL | Status: DC

## 2015-12-26 ENCOUNTER — Other Ambulatory Visit: Payer: Self-pay | Admitting: *Deleted

## 2015-12-26 MED ORDER — CLOPIDOGREL BISULFATE 75 MG PO TABS: ORAL_TABLET | ORAL | Status: DC

## 2015-12-26 MED ORDER — ATENOLOL 50 MG PO TABS: ORAL_TABLET | ORAL | Status: DC

## 2015-12-26 MED ORDER — HYDROCHLOROTHIAZIDE 50 MG PO TABS: ORAL_TABLET | ORAL | Status: DC

## 2015-12-26 MED ORDER — ISOSORBIDE MONONITRATE ER 30 MG PO TB24: ORAL_TABLET | ORAL | Status: DC

## 2015-12-30 ENCOUNTER — Encounter: Payer: Commercial Managed Care - HMO | Admitting: Physician Assistant

## 2016-01-03 ENCOUNTER — Encounter: Payer: Managed Care, Other (non HMO) | Admitting: Nurse Practitioner

## 2016-01-08 ENCOUNTER — Other Ambulatory Visit: Payer: Self-pay | Admitting: *Deleted

## 2016-01-08 ENCOUNTER — Other Ambulatory Visit: Payer: Self-pay | Admitting: Cardiovascular Disease

## 2016-01-08 MED ORDER — CLOPIDOGREL BISULFATE 75 MG PO TABS: ORAL_TABLET | ORAL | Status: DC

## 2016-01-08 MED ORDER — FA-PYRIDOXINE-CYANOCOBALAMIN 2.5-25-2 MG PO TABS: ORAL_TABLET | ORAL | Status: DC

## 2016-01-08 NOTE — Telephone Encounter (Signed)
Left msg on triage stating needing Rx for folbic sent to mail order. [facility].....[name]

## 2016-01-08 NOTE — Telephone Encounter (Signed)
had to call and inform pt's [place] that pt needed an appt before he could get anymore refills, pt is out of plavix, will send in refill and EC making appt.

## 2016-01-28 ENCOUNTER — Other Ambulatory Visit: Payer: Self-pay | Admitting: Cardiovascular Disease

## 2016-01-28 NOTE — Telephone Encounter (Signed)
PT IS OVER DUE FOR AN APPT, HE WAS GIVEN A REFILL WITH THE WARNING TO KEEP HIS [place] APPT, HE CANCELLED ALL 3 OF THOSE APPTS, HE CAN REQUEST REFILLS AT THE APR 14TH 2017 APPT.

## 2016-01-31 ENCOUNTER — Ambulatory Visit: Payer: Managed Care, Other (non HMO) | Admitting: Physician Assistant

## 2016-02-03 ENCOUNTER — Other Ambulatory Visit: Payer: Self-pay | Admitting: *Deleted

## 2016-02-03 MED ORDER — ROSUVASTATIN CALCIUM 40 MG PO TABS: ORAL_TABLET | ORAL | Status: DC

## 2016-02-03 NOTE — Telephone Encounter (Signed)
Patients daughter requested a crestor refill for the patient. She is aware that he will need to keep upcoming appointment for further refills.

## 2016-02-11 NOTE — Progress Notes (Signed)
Cardiology Office Note    Date:  02/14/2016   ID:  [name] [birth year], MRN [id number]  PCP:  [name], [place]  Cardiologist:  Dr. Julianne Reid   Cardiology follow up  History of Present Illness:  [name] is a 65 y.o. male with history of CAD s/p BMS to RCA (2002) , HTN, HLD, carotid artery disease s/p R CEA (2003) and OSA on CPAP who presents to clinic today for cardiac follow up.  He was admitted 2/13-2/14/17 for chest pain. He ruled out for MI but symptoms were concerning for [place] and he underwent coronary angiography. This revealed a patent RCA stent with diffuse non obstructive CAD. Continued medical therapy was recommended.   Today he presents to clinic for follow up. He lives in [place]. He works in [employer] and was 8 hours away. But, he drove here to get to the appointment. He has been feeling pretty good. He does get some heart burn from time to time but relieved for PPI. No SOB. No LE edema, orthopnea or PND. No dizziness or syncope. No palpitations. No blood in his stool or urine. No complaints at all, he needs Crestor filled.   Past Medical History  Diagnosis Date  . Coronary artery disease     s/p BMS to RCA 2002; [place] 2008: oLAD 40%, pRCA stent ok with 20%, EF 60%); Myoview scan in March 2011 which was negative for ischemia   . Hypertension   . Hyperlipidemia   . Bradycardia   . Carotid artery disease ([place])     Right CEA;  dopplers 6/12:  0-39% bilat ICA  . GERD (gastroesophageal reflux disease)   . Obstructive sleep apnea     Past Surgical History  Procedure Laterality Date  . Knee arthroscopy      right  . Carotid endarterectomy  06/26/2002    right  . Coronary angioplasty with stent placement  03/08/2000    Bare-metal stent in RCA, in [place]  . Cardiac catheterization  06/01/2007     EF of 65% -- Nonobstructive CAD with 40% ostial stenosis in the LAD, no significant obstruction in the circumflex artery, 20% narrowing within the stent in  the proximal to mid right coronary and normal LV function  . Cardiac catheterization  02/02/2006    EF of 50%  . Cardiac catheterization  01/19/2002    EF of 60%  . Mole removal      Prior moles removed  . Cardiac catheterization N/A 12/03/2015    Procedure: Left Heart Cath and Coronary Angiography;  Surgeon: Brandon Blanks, MD;  Location: [place] [facility];  Service: Cardiovascular;  Laterality: N/A;    Current Medications: Outpatient Prescriptions Prior to Visit  Medication Sig Dispense Refill  . aspirin 81 MG tablet Take 81 mg by mouth daily.      . folic acid-pyridoxine-cyancobalamin (FOLBIC) 2.5-25-2 MG TABS tablet Take 1 tablet by mouth  daily 90 tablet 2  . ramipril (ALTACE) 10 MG capsule Take 1 capsule by mouth two times daily 60 capsule 0  . atenolol (TENORMIN) 50 MG tablet Take one-half tablet by  mouth every day 90 tablet 0  . ezetimibe (ZETIA) 10 MG tablet Take 1 tablet (10 mg total) by mouth daily. 90 tablet 2  . hydrochlorothiazide (HYDRODIURIL) 50 MG tablet Take one-half tablet by  mouth daily 90 tablet 0  . isosorbide mononitrate (IMDUR) 30 MG 24 hr tablet Take 1 tablet by mouth  daily 90 tablet 0  .  meloxicam (MOBIC) 15 MG tablet Take 1 tablet by mouth  daily 90 tablet 0  . nitroGLYCERIN (NITROSTAT) 0.4 MG SL tablet Place 1 tablet (0.4 mg total) under the tongue every 5 (five) minutes as needed. 25 tablet 3  . NORVASC 10 MG tablet Take 1 tablet by mouth  daily 30 tablet 0  . pantoprazole (PROTONIX) 40 MG tablet Take 1 tablet by mouth  daily 90 tablet 2  . potassium chloride (K-DUR) 10 MEQ tablet Take 1 tablet (10 mEq total) by mouth daily. 90 tablet 0  . rosuvastatin (CRESTOR) 40 MG tablet Take 1 tablet by mouth daily **Patient must keep 02/14/16 appointment for further refills* 30 tablet 0  . clopidogrel (PLAVIX) 75 MG tablet Take 1 tablet by mouth  daily (Patient taking differently: Take 75 mg by mouth daily. Take 1 tablet by mouth  daily) 90 tablet 0   No  facility-administered medications prior to visit.     Allergies:   Felodipine and Niacin   Social History   Social History  . Marital Status: Married    Spouse Name: N/A  . Number of Children: 1  . Years of Education: 12   Occupational History  . Sales    Social History Main Topics  . Smoking status: Former Smoker -- 1.00 packs/day for 30 years    Types: Cigarettes    Quit date: 10/19/2000  . Smokeless tobacco: Current User    Types: Snuff  . Alcohol Use: Yes     Comment: Occasional beer  . Drug Use: No  . Sexual Activity: Not on file   Other Topics Concern  . Not on file   Social History Narrative   Fun: [place]     Family History:  The patient's family history includes Heart failure in his father; Hypertension in his brother and brother; Peripheral vascular disease in his mother.   ROS:   Please see the history of present illness.    ROS All other systems reviewed and are negative.   PHYSICAL EXAM:   VS:  There were no vitals taken for this visit.   GEN: Well nourished, well developed, in no acute distress HEENT: normal Neck: no JVD, carotid bruits, or masses Cardiac: RRR; no murmurs, rubs, or gallops,no edema  Respiratory:  clear to auscultation bilaterally, normal work of breathing GI: soft, nontender, nondistended, + BS MS: no deformity or atrophy Skin: warm and dry, no rash Neuro:  Alert and Oriented x 3, Strength and sensation are intact Psych: euthymic mood, full affect  Wt Readings from Last 3 Encounters:  12/05/15 194 lb 6.4 oz (88.179 kg)  12/03/15 188 lb (85.276 kg)  09/09/15 199 lb (90.266 kg)      Studies/Labs Reviewed:   EKG:  EKG is NOT ordered today.   Recent Labs: 12/03/2015: BUN 15; Creatinine, Ser 1.15; Hemoglobin 13.8; Platelets 151; Potassium 3.1*; Sodium 140   Lipid Panel    Component Value Date/Time   CHOL 96 12/03/2015 0408   TRIG 114 12/03/2015 0408   HDL 28* 12/03/2015 0408   CHOLHDL 3.4 12/03/2015 0408   VLDL 23  12/03/2015 0408   LDLCALC 45 12/03/2015 0408    Additional studies/ records that were reviewed today include:  LHC 12/03/15 Conclusion     Prox RCA to Mid RCA lesion, 30% stenosed. The lesion was previously treated with a bare metal stent greater than two years ago.  Prox RCA lesion, 30% stenosed.  Mid RCA lesion, 30% stenosed.  Dist RCA lesion, 40%  stenosed.  Mid Cx lesion, 30% stenosed.  1st Mrg lesion, 30% stenosed.  Prox LAD to Mid LAD lesion, 20% stenosed.  Ost LAD lesion, 30% stenosed.  1st Diag lesion, 20% stenosed.  Dist LAD lesion, 20% stenosed.  The left ventricular systolic function is normal.  Recommendations: Continue medical management of CAD. No further ischemic workup.            ASSESSMENT:    1. Coronary artery disease involving native coronary artery of native heart without angina pectoris   2. Hyperlipidemia   3. Essential hypertension   4. Bilateral carotid artery disease (HCC)      PLAN:  In order of problems listed above:  CAD: recent cath in 11/2015 with patent stent and diffuse non obstructive CAD. Continue ASA, plavix, and BB.  HLD: recent lipid panel 12/03/15 with excellent LDL (45). Continue statin and Zetia.   HTN: BP well controlled on current regimen.   Carotid artery disease: repeat carotids due in 08/2016. I will have this arranged.   Medication Adjustments/Labs and Tests Ordered: Current medicines are reviewed at length with the patient today.  Concerns regarding medicines are outlined above.  Medication changes, Labs and Tests ordered today are listed in the Patient Instructions below. There are no Patient Instructions on file for this visit.   Renea Ee  02/14/2016 2:54 PM    [facility] [place], [place], [place]  [address] Phone: [phone]; Fax: [phone]

## 2016-02-14 ENCOUNTER — Ambulatory Visit (INDEPENDENT_AMBULATORY_CARE_PROVIDER_SITE_OTHER): Payer: Managed Care, Other (non HMO) | Admitting: Physician Assistant

## 2016-02-14 ENCOUNTER — Other Ambulatory Visit: Payer: Self-pay | Admitting: Physician Assistant

## 2016-02-14 DIAGNOSIS — I2510 Atherosclerotic heart disease of native coronary artery without angina pectoris: Secondary | ICD-10-CM

## 2016-02-14 DIAGNOSIS — E785 Hyperlipidemia, unspecified: Secondary | ICD-10-CM | POA: Diagnosis not present

## 2016-02-14 DIAGNOSIS — I10 Essential (primary) hypertension: Secondary | ICD-10-CM | POA: Diagnosis not present

## 2016-02-14 DIAGNOSIS — I779 Disorder of arteries and arterioles, unspecified: Secondary | ICD-10-CM

## 2016-02-14 DIAGNOSIS — I739 Peripheral vascular disease, unspecified: Secondary | ICD-10-CM

## 2016-02-14 MED ORDER — NITROGLYCERIN 0.4 MG SL SUBL: 0.4000 mg | SUBLINGUAL_TABLET | SUBLINGUAL | Status: DC | PRN

## 2016-02-14 MED ORDER — POTASSIUM CHLORIDE ER 10 MEQ PO TBCR: 10.0000 meq | EXTENDED_RELEASE_TABLET | Freq: Every day | ORAL | Status: DC

## 2016-02-14 MED ORDER — ATENOLOL 50 MG PO TABS: ORAL_TABLET | ORAL | Status: DC

## 2016-02-14 MED ORDER — PANTOPRAZOLE SODIUM 40 MG PO TBEC: DELAYED_RELEASE_TABLET | ORAL | Status: DC

## 2016-02-14 MED ORDER — CLOPIDOGREL BISULFATE 75 MG PO TABS: 75.0000 mg | ORAL_TABLET | Freq: Every day | ORAL | Status: DC

## 2016-02-14 MED ORDER — AMLODIPINE BESYLATE 10 MG PO TABS: 10.0000 mg | ORAL_TABLET | Freq: Every day | ORAL | Status: DC

## 2016-02-14 MED ORDER — ISOSORBIDE MONONITRATE ER 30 MG PO TB24: ORAL_TABLET | ORAL | Status: DC

## 2016-02-14 MED ORDER — EZETIMIBE 10 MG PO TABS: 10.0000 mg | ORAL_TABLET | Freq: Every day | ORAL | Status: DC

## 2016-02-14 MED ORDER — ROSUVASTATIN CALCIUM 40 MG PO TABS: 40.0000 mg | ORAL_TABLET | Freq: Every day | ORAL | Status: DC

## 2016-02-14 MED ORDER — HYDROCHLOROTHIAZIDE 50 MG PO TABS: ORAL_TABLET | ORAL | Status: DC

## 2016-02-14 NOTE — Patient Instructions (Signed)
Medication Instructions:  Your physician recommends that you continue on your current medications as directed. Please refer to the Current Medication list given to you today.   Labwork: None ordered  Testing/Procedures: Your physician has requested that you have a carotid duplex IN November 2017 This test is an ultrasound of the carotid arteries in your neck. It looks at blood flow through these arteries that supply the brain with blood. Allow one hour for this exam. There are no restrictions or special instructions.    Follow-Up: Your physician wants you to follow-up in: [place] DR. Angelena Form  You will receive a reminder letter in the mail two months in advance. If you don't receive a letter, please call our office to schedule the follow-up appointment.   Any Other Special Instructions Will Be Listed Below (If Applicable).     If you need a refill on your cardiac medications before your next appointment, please call your pharmacy.

## 2016-07-20 ENCOUNTER — Other Ambulatory Visit: Payer: Self-pay | Admitting: Cardiovascular Disease

## 2016-09-16 ENCOUNTER — Other Ambulatory Visit: Payer: Self-pay | Admitting: Family

## 2016-10-07 ENCOUNTER — Ambulatory Visit (HOSPITAL_COMMUNITY)
Admission: RE | Admit: 2016-10-07 | Discharge: 2016-10-07 | Disposition: A | Discharge status: Home / Self Care | Payer: Managed Care, Other (non HMO) | Source: Ambulatory Visit | Attending: Cardiovascular Disease | Admitting: Cardiovascular Disease

## 2016-10-07 DIAGNOSIS — I2510 Atherosclerotic heart disease of native coronary artery without angina pectoris: Secondary | ICD-10-CM | POA: Insufficient documentation

## 2016-10-07 DIAGNOSIS — I10 Essential (primary) hypertension: Secondary | ICD-10-CM | POA: Diagnosis not present

## 2016-10-07 DIAGNOSIS — E785 Hyperlipidemia, unspecified: Secondary | ICD-10-CM | POA: Insufficient documentation

## 2016-10-07 DIAGNOSIS — I6523 Occlusion and stenosis of bilateral carotid arteries: Secondary | ICD-10-CM | POA: Diagnosis not present

## 2016-10-07 DIAGNOSIS — Z87891 Personal history of nicotine dependence: Secondary | ICD-10-CM | POA: Insufficient documentation

## 2016-12-30 ENCOUNTER — Other Ambulatory Visit: Payer: Self-pay | Admitting: Cardiovascular Disease

## 2017-01-06 ENCOUNTER — Other Ambulatory Visit: Payer: Self-pay | Admitting: Physician Assistant

## 2017-01-14 ENCOUNTER — Other Ambulatory Visit: Payer: Self-pay | Admitting: Physician Assistant

## 2017-02-10 ENCOUNTER — Other Ambulatory Visit: Payer: Self-pay | Admitting: Physician Assistant

## 2017-02-10 DIAGNOSIS — E785 Hyperlipidemia, unspecified: Secondary | ICD-10-CM

## 2017-03-15 ENCOUNTER — Other Ambulatory Visit: Payer: Self-pay | Admitting: Physician Assistant

## 2017-03-21 ENCOUNTER — Other Ambulatory Visit: Payer: Self-pay | Admitting: Physician Assistant

## 2017-04-02 ENCOUNTER — Other Ambulatory Visit: Payer: Self-pay

## 2017-04-02 MED ORDER — NITROGLYCERIN 0.4 MG SL SUBL: 0.4000 mg | SUBLINGUAL_TABLET | SUBLINGUAL | 0 refills | Status: DC | PRN

## 2017-04-05 ENCOUNTER — Other Ambulatory Visit: Payer: Self-pay | Admitting: Cardiovascular Disease

## 2017-04-07 ENCOUNTER — Other Ambulatory Visit: Payer: Self-pay | Admitting: Cardiovascular Disease

## 2017-04-20 ENCOUNTER — Ambulatory Visit: Payer: Managed Care, Other (non HMO) | Admitting: Family

## 2017-04-22 ENCOUNTER — Encounter: Payer: Self-pay | Admitting: Family

## 2017-04-22 ENCOUNTER — Other Ambulatory Visit (INDEPENDENT_AMBULATORY_CARE_PROVIDER_SITE_OTHER): Payer: Managed Care, Other (non HMO)

## 2017-04-22 ENCOUNTER — Other Ambulatory Visit: Payer: Self-pay | Admitting: Family

## 2017-04-22 ENCOUNTER — Ambulatory Visit (INDEPENDENT_AMBULATORY_CARE_PROVIDER_SITE_OTHER): Payer: Managed Care, Other (non HMO) | Admitting: Family

## 2017-04-22 VITALS — BP 128/72 | HR 62 | Temp 98.2°F | Resp 16 | Ht 69.0 in | Wt 205.4 lb

## 2017-04-22 DIAGNOSIS — E7800 Pure hypercholesterolemia, unspecified: Secondary | ICD-10-CM

## 2017-04-22 DIAGNOSIS — I10 Essential (primary) hypertension: Secondary | ICD-10-CM

## 2017-04-22 DIAGNOSIS — R972 Elevated prostate specific antigen [PSA]: Secondary | ICD-10-CM

## 2017-04-22 DIAGNOSIS — I2510 Atherosclerotic heart disease of native coronary artery without angina pectoris: Secondary | ICD-10-CM

## 2017-04-22 DIAGNOSIS — Z125 Encounter for screening for malignant neoplasm of prostate: Secondary | ICD-10-CM

## 2017-04-22 DIAGNOSIS — K219 Gastro-esophageal reflux disease without esophagitis: Secondary | ICD-10-CM | POA: Diagnosis not present

## 2017-04-22 LAB — LIPID PANEL
Cholesterol: 117 mg/dL (ref 0–200)
HDL: 37.4 mg/dL — ABNORMAL LOW (ref >39.00)
LDL Cholesterol: 64 mg/dL (ref 0–99)
NonHDL: 79.47
Total CHOL/HDL Ratio: 3
Triglycerides: 75 mg/dL (ref 0.0–149.0)
VLDL: 15 mg/dL (ref 0.0–40.0)

## 2017-04-22 LAB — COMPREHENSIVE METABOLIC PANEL
ALT: 23 U/L (ref 0–53)
AST: 20 U/L (ref 0–37)
Albumin: 4.5 g/dL (ref 3.5–5.2)
Alkaline Phosphatase: 54 U/L (ref 39–117)
BUN: 16 mg/dL (ref 6–23)
CO2: 30 mEq/L (ref 19–32)
Calcium: 9.9 mg/dL (ref 8.4–10.5)
Chloride: 107 mEq/L (ref 96–112)
Creatinine, Ser: 1.15 mg/dL (ref 0.40–1.50)
GFR: 67.95 mL/min (ref >60.00)
Glucose, Bld: 104 mg/dL — ABNORMAL HIGH (ref 70–99)
Potassium: 4.8 mEq/L (ref 3.5–5.1)
Sodium: 144 mEq/L (ref 135–145)
Total Bilirubin: 1.2 mg/dL (ref 0.2–1.2)
Total Protein: 7 g/dL (ref 6.0–8.3)

## 2017-04-22 LAB — CBC
HCT: 39.3 % (ref 39.0–52.0)
Hemoglobin: 13.6 g/dL (ref 13.0–17.0)
MCHC: 34.7 g/dL (ref 30.0–36.0)
MCV: 87.7 fl (ref 78.0–100.0)
Platelets: 175 10*3/uL (ref 150.0–400.0)
RBC: 4.48 Mil/uL (ref 4.22–5.81)
RDW: 13 % (ref 11.5–15.5)
WBC: 5.8 10*3/uL (ref 4.0–10.5)

## 2017-04-22 LAB — PSA: PSA: 6.85 ng/mL — ABNORMAL HIGH (ref 0.10–4.00)

## 2017-04-22 MED ORDER — EZETIMIBE 10 MG PO TABS: 10.0000 mg | ORAL_TABLET | Freq: Every day | ORAL | 0 refills | Status: DC

## 2017-04-22 MED ORDER — ISOSORBIDE MONONITRATE ER 30 MG PO TB24: 30.0000 mg | ORAL_TABLET | Freq: Every day | ORAL | 0 refills | Status: DC

## 2017-04-22 MED ORDER — ROSUVASTATIN CALCIUM 40 MG PO TABS: 40.0000 mg | ORAL_TABLET | Freq: Every day | ORAL | 0 refills | Status: DC

## 2017-04-22 MED ORDER — NITROGLYCERIN 0.4 MG SL SUBL: 0.4000 mg | SUBLINGUAL_TABLET | SUBLINGUAL | 0 refills | Status: DC | PRN

## 2017-04-22 MED ORDER — POTASSIUM CHLORIDE CRYS ER 10 MEQ PO TBCR: 10.0000 meq | EXTENDED_RELEASE_TABLET | Freq: Every day | ORAL | 0 refills | Status: DC

## 2017-04-22 MED ORDER — CLOPIDOGREL BISULFATE 75 MG PO TABS: 75.0000 mg | ORAL_TABLET | Freq: Every day | ORAL | 0 refills | Status: DC

## 2017-04-22 MED ORDER — RAMIPRIL 10 MG PO CAPS: ORAL_CAPSULE | ORAL | 0 refills | Status: DC

## 2017-04-22 MED ORDER — FA-PYRIDOXINE-CYANOCOBALAMIN 2.5-25-2 MG PO TABS: 1.0000 | ORAL_TABLET | Freq: Every day | ORAL | 0 refills | Status: DC

## 2017-04-22 MED ORDER — ATENOLOL 50 MG PO TABS: 25.0000 mg | ORAL_TABLET | Freq: Every day | ORAL | 0 refills | Status: DC

## 2017-04-22 MED ORDER — PANTOPRAZOLE SODIUM 40 MG PO TBEC: 40.0000 mg | DELAYED_RELEASE_TABLET | Freq: Every day | ORAL | 0 refills | Status: DC

## 2017-04-22 MED ORDER — HYDROCHLOROTHIAZIDE 50 MG PO TABS: 25.0000 mg | ORAL_TABLET | Freq: Every day | ORAL | 0 refills | Status: DC

## 2017-04-22 MED ORDER — AMLODIPINE BESYLATE 10 MG PO TABS: ORAL_TABLET | ORAL | 0 refills | Status: DC

## 2017-04-22 NOTE — Assessment & Plan Note (Signed)
Stable with current medication regimen and no adverse side effects. Discussed GERD related intake. Continue current dosage of pantoprazole.

## 2017-04-22 NOTE — Patient Instructions (Addendum)
Thank you for choosing [facility].  SUMMARY AND INSTRUCTIONS:  Please call our office to schedule an overdue yearly appointment before anymore refills. [phone].   Continue to take your medications as prescribed.  Follow up with cardiology.  Monitor at blood pressure at home.   Medication:  Your prescription(s) have been submitted to your pharmacy or been printed and provided for you. Please take as directed and contact our office if you believe you are having problem(s) with the medication(s) or have any questions.  Labs:  Please stop by the lab on the lower level of the building for your blood work. Your results will be released to [place] (or called to you) after review, usually within 72 hours after test completion. If any changes need to be made, you will be notified at that same time.  1.) The lab is open from 7:30am to 5:30 pm Monday-Friday 2.) No appointment is necessary 3.) Fasting (if needed) is 6-8 hours after food and drink; black coffee and water are okay   Follow up:  If your symptoms worsen or fail to improve, please contact our office for further instruction, or in case of emergency go directly to the emergency room at the closest medical facility.

## 2017-04-22 NOTE — Assessment & Plan Note (Signed)
Blood pressure well-controlled and below goal 140/90 with current medication regimen and no adverse side effects. Denies worse headache of life was noted symptoms of end organ damage noted physical exam. Continue current dosage of atenolol, hydrochlorothiazide, amlodipine, and ramipril. Encouraged while her blood pressure at home and follow low-sodium diet.

## 2017-04-22 NOTE — Assessment & Plan Note (Signed)
Appears stable with current medication regimen and no adverse side effects or myalgias. Obtain lipid profile. Encouraged a low fat and Mediterranean-style intake. Continue current dosage of rosuvastatin and ezetimibe pending lipid profile results.

## 2017-04-22 NOTE — Assessment & Plan Note (Signed)
Stable with no current cardiac symptoms. Anticoagulated with clopidogrel. Continue risk factor management with control of chronic co-morbidities. Recommend annual follow up with cardiology.

## 2017-04-22 NOTE — Progress Notes (Signed)
Subjective:    Patient ID: [name], male    DOB: [birth year], 66 y.o.   MRN: [id number]  Chief Complaint  Patient presents with  . Medication Refill    refill on medications, could not get refills until he was seen, [facility]    HPI:  [name] is a 66 y.o. male who  has a past medical history of Bradycardia; Carotid artery disease ([place]); Coronary artery disease; GERD (gastroesophageal reflux disease); Hyperlipidemia; Hypertension; and Obstructive sleep apnea. and presents today for a follow up office visit.  1.) Hypertension - Currently maintained on amlodipine, atenolol, hydrochlorothiazide, and ramipril. Reports taking the medications as prescribed and denies adverse side effects or hypotensive readings. Blood pressures at home about once per week has been well controlled. . Denies changes in vision, worst headache of life or new symptoms of end organ damage. Working on following a low sodium diet.   BP Readings from Last 3 Encounters:  04/22/17 128/72  12/05/15 128/72  12/03/15 123/65   2.) Hyperlipidemia - Currently maintained on rosuvastatin and ezetimibe. Reports taking the medication as prescribed and denies adverse side effects or myalgias. Denies chest pain, dyspnea on exertion, or shortness of breath.   Lab Results  Component Value Date   CHOL 117 04/22/2017   HDL 37.40 (L) 04/22/2017   LDLCALC 64 04/22/2017   TRIG 75.0 04/22/2017   CHOLHDL 3 04/22/2017     3.) GERD- Currently maintained on pantoprazole. Reports taking the medication as prescribed and denies adverse side effects. No abdominal pain, nausea, vomiting, diarrhea, or constipation.   Allergies  Allergen Reactions  . Felodipine Swelling  . Niacin     REACTION: not tolerated.      Outpatient Medications Prior to Visit  Medication Sig Dispense Refill  . aspirin 81 MG tablet Take 81 mg by mouth daily.      Marland Kitchen amLODipine (NORVASC) 10 MG tablet TAKE 1 TABLET DAILY (NEED TO  SCHEDULE AN APPOINTMENT, PLEASE CALL [phone]) 60 tablet 0  . atenolol (TENORMIN) 50 MG tablet TAKE ONE-HALF (1/2) TABLET DAILY 45 tablet 0  . clopidogrel (PLAVIX) 75 MG tablet TAKE 1 TABLET DAILY 30 tablet 0  . ezetimibe (ZETIA) 10 MG tablet TAKE 1 TABLET DAILY 30 tablet 0  . FOLBIC 2.5-25-2 MG TABS tablet TAKE 1 TABLET DAILY 90 tablet 2  . hydrochlorothiazide (HYDRODIURIL) 50 MG tablet TAKE ONE-HALF (1/2) TABLET DAILY 15 tablet 0  . isosorbide mononitrate (IMDUR) 30 MG 24 hr tablet TAKE 1 TABLET DAILY 30 tablet 0  . nitroGLYCERIN (NITROSTAT) 0.4 MG SL tablet Place 1 tablet (0.4 mg total) under the tongue every 5 (five) minutes as needed. 15 tablet 0  . pantoprazole (PROTONIX) 40 MG tablet TAKE 1 TABLET DAILY 30 tablet 0  . potassium chloride (K-DUR) 10 MEQ tablet Take 1 tablet (10 mEq total) by mouth daily. 90 tablet 3  . potassium chloride (K-DUR,KLOR-CON) 10 MEQ tablet TAKE 1 TABLET DAILY 30 tablet 0  . ramipril (ALTACE) 10 MG capsule TAKE 1 CAPSULE TWICE A DAY (PLEASE CALL AND SCHEDULE A ONE YEAR FOLLOW UP APPOINTMENT) 60 capsule 0  . rosuvastatin (CRESTOR) 40 MG tablet TAKE 1 TABLET DAILY 90 tablet 3   No facility-administered medications prior to visit.       Past Surgical History:  Procedure Laterality Date  . CARDIAC CATHETERIZATION  06/01/2007    EF of 65% -- Nonobstructive CAD with 40% ostial stenosis in the LAD, no significant obstruction  in the circumflex artery, 20% narrowing within the stent in the proximal to mid right coronary and normal LV function  . CARDIAC CATHETERIZATION  02/02/2006   EF of 50%  . CARDIAC CATHETERIZATION  01/19/2002   EF of 60%  . CARDIAC CATHETERIZATION N/A 12/03/2015   Procedure: Left Heart Cath and Coronary Angiography;  Surgeon: Burnell Blanks, MD;  Location: [facility];  Service: Cardiovascular;  Laterality: N/A;  . CAROTID ENDARTERECTOMY  06/26/2002   right  . CORONARY ANGIOPLASTY WITH STENT PLACEMENT  03/08/2000    Bare-metal stent in RCA, in [place]  . KNEE ARTHROSCOPY     right  . MOLE REMOVAL     Prior moles removed      Past Medical History:  Diagnosis Date  . Bradycardia   . Carotid artery disease ([place])    Right CEA;  dopplers 6/12:  0-39% bilat ICA  . Coronary artery disease    s/p BMS to RCA 2002; [place] 2008: oLAD 40%, pRCA stent ok with 20%, EF 60%); Myoview scan in March 2011 which was negative for ischemia   . GERD (gastroesophageal reflux disease)   . Hyperlipidemia   . Hypertension   . Obstructive sleep apnea       Review of Systems  Constitutional: Negative for chills and fever.  Eyes:       Negative for changes in vision  Respiratory: Negative for cough, chest tightness, shortness of breath and wheezing.   Cardiovascular: Negative for chest pain, palpitations and leg swelling.  Musculoskeletal: Negative for arthralgias and myalgias.  Neurological: Negative for dizziness, weakness and light-headedness.      Objective:    BP 128/72 (BP Location: Left Arm, Patient Position: Sitting, Cuff Size: Large)   Pulse 62   Temp 98.2 F (36.8 C) (Oral)   Resp 16   Ht 5\' 9"  (1.753 m)   Wt 205 lb 6.4 oz (93.2 kg)   SpO2 96%   BMI 30.33 kg/m  Nursing note and vital signs reviewed.  Physical Exam  Constitutional: He is oriented to person, place, and time. He appears well-developed and well-nourished. No distress.  Cardiovascular: Normal rate, regular rhythm, normal heart sounds and intact distal pulses.   Pulmonary/Chest: Effort normal and breath sounds normal.  Neurological: He is alert and oriented to person, place, and time.  Skin: Skin is warm and dry.  Psychiatric: He has a normal mood and affect. His behavior is normal. Judgment and thought content normal.       Assessment & Plan:   Problem List Items Addressed This Visit      Cardiovascular and Mediastinum   Essential hypertension - Primary    Blood pressure well-controlled and below goal 140/90 with current  medication regimen and no adverse side effects. Denies worse headache of life was noted symptoms of end organ damage noted physical exam. Continue current dosage of atenolol, hydrochlorothiazide, amlodipine, and ramipril. Encouraged while her blood pressure at home and follow low-sodium diet.      Relevant Medications   nitroGLYCERIN (NITROSTAT) 0.4 MG SL tablet   amLODipine (NORVASC) 10 MG tablet   atenolol (TENORMIN) 50 MG tablet   ezetimibe (ZETIA) 10 MG tablet   hydrochlorothiazide (HYDRODIURIL) 50 MG tablet   isosorbide mononitrate (IMDUR) 30 MG 24 hr tablet   ramipril (ALTACE) 10 MG capsule   rosuvastatin (CRESTOR) 40 MG tablet   Other Relevant Orders   CBC (Completed)   Comprehensive metabolic panel (Completed)   Coronary atherosclerosis  Stable with no current cardiac symptoms. Anticoagulated with clopidogrel. Continue risk factor management with control of chronic co-morbidities. Recommend annual follow up with cardiology.       Relevant Medications   nitroGLYCERIN (NITROSTAT) 0.4 MG SL tablet   amLODipine (NORVASC) 10 MG tablet   atenolol (TENORMIN) 50 MG tablet   ezetimibe (ZETIA) 10 MG tablet   hydrochlorothiazide (HYDRODIURIL) 50 MG tablet   isosorbide mononitrate (IMDUR) 30 MG 24 hr tablet   ramipril (ALTACE) 10 MG capsule   rosuvastatin (CRESTOR) 40 MG tablet   Other Relevant Orders   CBC (Completed)   Comprehensive metabolic panel (Completed)     Digestive   GASTROESOPHAGEAL REFLUX DISEASE    Stable with current medication regimen and no adverse side effects. Discussed GERD related intake. Continue current dosage of pantoprazole.       Relevant Medications   pantoprazole (PROTONIX) 40 MG tablet     Other   Hyperlipidemia    Appears stable with current medication regimen and no adverse side effects or myalgias. Obtain lipid profile. Encouraged a low fat and Mediterranean-style intake. Continue current dosage of rosuvastatin and ezetimibe pending lipid  profile results.      Relevant Medications   nitroGLYCERIN (NITROSTAT) 0.4 MG SL tablet   amLODipine (NORVASC) 10 MG tablet   atenolol (TENORMIN) 50 MG tablet   ezetimibe (ZETIA) 10 MG tablet   hydrochlorothiazide (HYDRODIURIL) 50 MG tablet   isosorbide mononitrate (IMDUR) 30 MG 24 hr tablet   ramipril (ALTACE) 10 MG capsule   rosuvastatin (CRESTOR) 40 MG tablet   Other Relevant Orders   Lipid panel (Completed)   CBC (Completed)   Comprehensive metabolic panel (Completed)    Other Visit Diagnoses    Special screening, prostate cancer       Relevant Orders   PSA (Completed)       I have discontinued Mr. Mcfarland's potassium chloride. I have changed his FOLBIC to folic acid-pyridoxine-cyancobalamin. I have also changed his nitroGLYCERIN, amLODipine, atenolol, clopidogrel, ezetimibe, hydrochlorothiazide, isosorbide mononitrate, pantoprazole, ramipril, rosuvastatin, and potassium chloride. Additionally, I am having him maintain his aspirin.   Meds ordered this encounter  Medications  . nitroGLYCERIN (NITROSTAT) 0.4 MG SL tablet    Sig: Place 1 tablet (0.4 mg total) under the tongue every 5 (five) minutes as needed. Call 9-1-1 if need more than 2.    Dispense:  15 tablet    Refill:  0    Order Specific Question:   Supervising Provider    Answer:   Pricilla Holm A [[id number]]  . amLODipine (NORVASC) 10 MG tablet    Sig: Take 1 tablet by mouth daily    Dispense:  90 tablet    Refill:  0    Order Specific Question:   Supervising Provider    Answer:   Pricilla Holm A [[id number]]  . atenolol (TENORMIN) 50 MG tablet    Sig: Take 0.5 tablets (25 mg total) by mouth daily.    Dispense:  45 tablet    Refill:  0    Order Specific Question:   Supervising Provider    Answer:   Pricilla Holm A [[id number]]  . clopidogrel (PLAVIX) 75 MG tablet    Sig: Take 1 tablet (75 mg total) by mouth daily.    Dispense:  90 tablet    Refill:  0    Order Specific Question:   Supervising Provider     Answer:   Pricilla Holm A [[id number]]  . ezetimibe (ZETIA) 10 MG tablet  Sig: Take 1 tablet (10 mg total) by mouth daily.    Dispense:  30 tablet    Refill:  0    Order Specific Question:   Supervising Provider    Answer:   Pricilla Holm A [[id number]]  . folic acid-pyridoxine-cyancobalamin (FOLBIC) 2.5-25-2 MG TABS tablet    Sig: Take 1 tablet by mouth daily.    Dispense:  90 tablet    Refill:  0    Order Specific Question:   Supervising Provider    Answer:   Pricilla Holm A [[id number]]  . hydrochlorothiazide (HYDRODIURIL) 50 MG tablet    Sig: Take 0.5 tablets (25 mg total) by mouth daily.    Dispense:  45 tablet    Refill:  0    Order Specific Question:   Supervising Provider    Answer:   Pricilla Holm A [[id number]]  . isosorbide mononitrate (IMDUR) 30 MG 24 hr tablet    Sig: Take 1 tablet (30 mg total) by mouth daily.    Dispense:  90 tablet    Refill:  0    Order Specific Question:   Supervising Provider    Answer:   Pricilla Holm A [[id number]]  . pantoprazole (PROTONIX) 40 MG tablet    Sig: Take 1 tablet (40 mg total) by mouth daily.    Dispense:  90 tablet    Refill:  0    Order Specific Question:   Supervising Provider    Answer:   Pricilla Holm A [[id number]]  . ramipril (ALTACE) 10 MG capsule    Sig: Take 1 capsule by mouth twice daily.    Dispense:  180 capsule    Refill:  0    Order Specific Question:   Supervising Provider    Answer:   Pricilla Holm A [[id number]]  . rosuvastatin (CRESTOR) 40 MG tablet    Sig: Take 1 tablet (40 mg total) by mouth daily.    Dispense:  90 tablet    Refill:  0    Order Specific Question:   Supervising Provider    Answer:   Pricilla Holm A [[id number]]  . potassium chloride (K-DUR,KLOR-CON) 10 MEQ tablet    Sig: Take 1 tablet (10 mEq total) by mouth daily.    Dispense:  90 tablet    Refill:  0    Order Specific Question:   Supervising Provider    Answer:   Pricilla Holm A [[id number]]     Follow-up: Return in about 6  months (around 10/23/2017), or if symptoms worsen or fail to improve.  Mauricio Po, FNP

## 2017-07-26 ENCOUNTER — Other Ambulatory Visit: Payer: Self-pay | Admitting: Family

## 2017-08-16 ENCOUNTER — Ambulatory Visit (INDEPENDENT_AMBULATORY_CARE_PROVIDER_SITE_OTHER): Payer: Managed Care, Other (non HMO) | Admitting: Nurse Practitioner

## 2017-08-16 ENCOUNTER — Encounter: Payer: Self-pay | Admitting: Nurse Practitioner

## 2017-08-16 VITALS — BP 130/72 | HR 67 | Temp 97.7°F | Ht 69.0 in | Wt 199.0 lb

## 2017-08-16 DIAGNOSIS — I739 Peripheral vascular disease, unspecified: Secondary | ICD-10-CM | POA: Diagnosis not present

## 2017-08-16 DIAGNOSIS — R0989 Other specified symptoms and signs involving the circulatory and respiratory systems: Secondary | ICD-10-CM | POA: Diagnosis not present

## 2017-08-16 NOTE — Patient Instructions (Addendum)
You will be contacted to schedule appt for arterial ultrasound and appt with vascular surgeon.  Go to hospital if symptoms worsen.

## 2017-08-16 NOTE — Progress Notes (Signed)
Subjective:  Patient ID: NOLLAN MULDROW, male    DOB: 02/11/1951  Age: 66 y.o. MRN: [id number]  CC: Leg Pain (toes turn black going for 2 days,caft painful at time---going on 1 days, left ankle swelling--going on for a while/ had prostate precedure recently and he was off of his blood thinner  for this. )  Accompanied by wife and daughter.  Leg Pain   The incident occurred 2 days ago. The injury mechanism is unknown. The pain is present in the left toes, left foot, right foot and right toes (bilateral calf muscles). The quality of the pain is described as cramping. The pain has been intermittent since onset. Associated symptoms include a loss of sensation, numbness and tingling. Pertinent negatives include no inability to bear weight, loss of motion or muscle weakness. He reports no foreign bodies present. Exacerbated by: ambulation and leg elavation. He has tried rest for the symptoms.  wife and daughter report change in skin color yesterday (greyish color of both toes)  Outpatient Medications Prior to Visit  Medication Sig Dispense Refill  . amLODipine (NORVASC) 10 MG tablet Take 1 tablet by mouth daily 90 tablet 0  . aspirin 81 MG tablet Take 81 mg by mouth daily.      Marland Kitchen atenolol (TENORMIN) 50 MG tablet Take 0.5 tablets (25 mg total) by mouth daily. 45 tablet 0  . clopidogrel (PLAVIX) 75 MG tablet Take 1 tablet (75 mg total) by mouth daily. 90 tablet 0  . ezetimibe (ZETIA) 10 MG tablet Take 1 tablet (10 mg total) by mouth daily. 30 tablet 0  . folic acid-pyridoxine-cyancobalamin (FOLBIC) 2.5-25-2 MG TABS tablet Take 1 tablet by mouth daily. 90 tablet 0  . hydrochlorothiazide (HYDRODIURIL) 50 MG tablet Take 0.5 tablets (25 mg total) by mouth daily. 45 tablet 0  . isosorbide mononitrate (IMDUR) 30 MG 24 hr tablet Take 1 tablet (30 mg total) by mouth daily. 90 tablet 0  . nitroGLYCERIN (NITROSTAT) 0.4 MG SL tablet Place 1 tablet (0.4 mg total) under the tongue every 5 (five) minutes as needed.  Call 9-1-1 if need more than 2. 15 tablet 0  . pantoprazole (PROTONIX) 40 MG tablet Take 1 tablet (40 mg total) by mouth daily. 90 tablet 0  . potassium chloride (K-DUR,KLOR-CON) 10 MEQ tablet TAKE 1 TABLET DAILY 90 tablet 0  . ramipril (ALTACE) 10 MG capsule Take 1 capsule by mouth twice daily. 180 capsule 0  . rosuvastatin (CRESTOR) 40 MG tablet Take 1 tablet (40 mg total) by mouth daily. 90 tablet 0   No facility-administered medications prior to visit.     ROS See HPI  Objective:  BP 130/72   Pulse 67   Temp 97.7 F (36.5 C)   Ht 5\' 9"  (1.753 m)   Wt 199 lb (90.3 kg)   SpO2 99%   BMI 29.39 kg/m   BP Readings from Last 3 Encounters:  08/16/17 130/72  04/22/17 128/72  12/05/15 128/72    Wt Readings from Last 3 Encounters:  08/16/17 199 lb (90.3 kg)  04/22/17 205 lb 6.4 oz (93.2 kg)  12/05/15 194 lb 6.4 oz (88.2 kg)    Physical Exam  Constitutional: He is oriented to person, place, and time. No distress.  Cardiovascular: Normal rate and regular rhythm.  Exam reveals decreased pulses.   Pulses:      Femoral pulses are 2+ on the right side, and 1+ on the left side.      Dorsalis pedis pulses are 2+  on the right side, and 1+ on the left side.       Posterior tibial pulses are 2+ on the right side, and 1+ on the left side.  Pulmonary/Chest: Effort normal.  Musculoskeletal: He exhibits no edema or tenderness.  Neurological: He is alert and oriented to person, place, and time.  Skin: Skin is warm and dry. No erythema.  Vitals reviewed.   Lab Results  Component Value Date   WBC 5.8 04/22/2017   HGB 13.6 04/22/2017   HCT 39.3 04/22/2017   PLT 175.0 04/22/2017   GLUCOSE 104 (H) 04/22/2017   CHOL 117 04/22/2017   TRIG 75.0 04/22/2017   HDL 37.40 (L) 04/22/2017   LDLCALC 64 04/22/2017   ALT 23 04/22/2017   AST 20 04/22/2017   NA 144 04/22/2017   K 4.8 04/22/2017   CL 107 04/22/2017   CREATININE 1.15 04/22/2017   BUN 16 04/22/2017   CO2 30 04/22/2017   TSH  0.81 12/19/2009   PSA 6.85 (H) 04/22/2017   INR 1.12 12/03/2015    Assessment & Plan:   Brennon was seen today for leg pain.  Diagnoses and all orders for this visit:  Claudication of both lower extremities ([place]) -     VAS [place] ABI WITH/WO TBI; Future -     Ambulatory referral to Vascular Surgery  Diminished pulses in lower extremity -     VAS [place] ABI WITH/WO TBI; Future -     Ambulatory referral to Vascular Surgery   I am having Mr. Mutz maintain his aspirin, nitroGLYCERIN, amLODipine, atenolol, clopidogrel, ezetimibe, folic acid-pyridoxine-cyancobalamin, hydrochlorothiazide, isosorbide mononitrate, pantoprazole, ramipril, rosuvastatin, potassium chloride, doxazosin, and finasteride.  Meds ordered this encounter  Medications  . doxazosin (CARDURA) 2 MG tablet  . finasteride (PROSCAR) 5 MG tablet    Follow-up: No Follow-up on file.  Wilfred Lacy, NP

## 2017-08-17 ENCOUNTER — Other Ambulatory Visit: Payer: Self-pay | Admitting: Nurse Practitioner

## 2017-08-17 DIAGNOSIS — I739 Peripheral vascular disease, unspecified: Secondary | ICD-10-CM

## 2017-08-17 DIAGNOSIS — R0989 Other specified symptoms and signs involving the circulatory and respiratory systems: Secondary | ICD-10-CM

## 2017-08-18 ENCOUNTER — Ambulatory Visit (HOSPITAL_COMMUNITY)
Admission: RE | Admit: 2017-08-18 | Discharge: 2017-08-18 | Disposition: A | Discharge status: Home / Self Care | Payer: Managed Care, Other (non HMO) | Source: Ambulatory Visit | Attending: Cardiology | Admitting: Cardiology

## 2017-08-18 ENCOUNTER — Other Ambulatory Visit: Payer: Self-pay | Admitting: Nurse Practitioner

## 2017-08-18 DIAGNOSIS — R0989 Other specified symptoms and signs involving the circulatory and respiratory systems: Secondary | ICD-10-CM

## 2017-08-18 DIAGNOSIS — I739 Peripheral vascular disease, unspecified: Secondary | ICD-10-CM | POA: Insufficient documentation

## 2017-08-20 ENCOUNTER — Other Ambulatory Visit: Payer: Self-pay | Admitting: *Deleted

## 2017-08-20 MED ORDER — AMLODIPINE BESYLATE 10 MG PO TABS: ORAL_TABLET | ORAL | 1 refills | Status: DC

## 2017-08-23 ENCOUNTER — Telehealth: Payer: Self-pay | Admitting: Nurse Practitioner

## 2017-08-23 MED ORDER — RAMIPRIL 10 MG PO CAPS: ORAL_CAPSULE | ORAL | 0 refills | Status: DC

## 2017-08-23 MED ORDER — PANTOPRAZOLE SODIUM 40 MG PO TBEC: 40.0000 mg | DELAYED_RELEASE_TABLET | Freq: Every day | ORAL | 0 refills | Status: DC

## 2017-08-23 MED ORDER — ISOSORBIDE MONONITRATE ER 30 MG PO TB24: 30.0000 mg | ORAL_TABLET | Freq: Every day | ORAL | 0 refills | Status: DC

## 2017-08-23 MED ORDER — CLOPIDOGREL BISULFATE 75 MG PO TABS: 75.0000 mg | ORAL_TABLET | Freq: Every day | ORAL | 0 refills | Status: DC

## 2017-08-23 NOTE — Telephone Encounter (Signed)
Pt daughter called in said that pt needs refill on his   Isosorbide Amlodipine  Ramipril  Protonix Plavix   [facility]  Pt was on 3 month fu for meds

## 2017-08-23 NOTE — Telephone Encounter (Signed)
Per chart Amlodipine refills already sent on 08/20/17, [facility]...Johny Chess

## 2017-08-26 ENCOUNTER — Other Ambulatory Visit: Payer: Self-pay | Admitting: *Deleted

## 2017-08-26 DIAGNOSIS — E7800 Pure hypercholesterolemia, unspecified: Secondary | ICD-10-CM

## 2017-08-26 MED ORDER — PANTOPRAZOLE SODIUM 40 MG PO TBEC: 40.0000 mg | DELAYED_RELEASE_TABLET | Freq: Every day | ORAL | 0 refills | Status: DC

## 2017-08-26 MED ORDER — RAMIPRIL 10 MG PO CAPS: ORAL_CAPSULE | ORAL | 0 refills | Status: DC

## 2017-08-26 MED ORDER — ISOSORBIDE MONONITRATE ER 30 MG PO TB24: 30.0000 mg | ORAL_TABLET | Freq: Every day | ORAL | 0 refills | Status: DC

## 2017-08-26 MED ORDER — ATENOLOL 50 MG PO TABS: 25.0000 mg | ORAL_TABLET | Freq: Every day | ORAL | 0 refills | Status: DC

## 2017-08-26 MED ORDER — CLOPIDOGREL BISULFATE 75 MG PO TABS: 75.0000 mg | ORAL_TABLET | Freq: Every day | ORAL | 0 refills | Status: DC

## 2017-08-26 MED ORDER — EZETIMIBE 10 MG PO TABS: 10.0000 mg | ORAL_TABLET | Freq: Every day | ORAL | 0 refills | Status: DC

## 2017-08-26 MED ORDER — HYDROCHLOROTHIAZIDE 50 MG PO TABS: 25.0000 mg | ORAL_TABLET | Freq: Every day | ORAL | 0 refills | Status: DC

## 2017-08-26 MED ORDER — NITROGLYCERIN 0.4 MG SL SUBL: 0.4000 mg | SUBLINGUAL_TABLET | SUBLINGUAL | 0 refills | Status: DC | PRN

## 2017-08-26 NOTE — Addendum Note (Signed)
Addended by: Earnstine Regal on: 08/26/2017 12:09 PM   Modules accepted: Orders

## 2017-08-26 NOTE — Addendum Note (Signed)
Addended by: Earnstine Regal on: 08/26/2017 12:06 PM   Modules accepted: Orders

## 2017-08-30 ENCOUNTER — Telehealth: Payer: Self-pay | Admitting: Nurse Practitioner

## 2017-08-30 MED ORDER — NITROGLYCERIN 0.4 MG SL SUBL: 0.4000 mg | SUBLINGUAL_TABLET | SUBLINGUAL | 0 refills | Status: DC | PRN

## 2017-08-30 NOTE — Telephone Encounter (Signed)
Spoke with Janett Billow, cancel the old rx and new one sent in as requested from pharmacy.

## 2017-08-30 NOTE — Telephone Encounter (Signed)
[facility] and the smallest quanity of Nitroglycerin they have is a pack of 25 Please call back if ok to dispense and change Rx Reference Number  [id number]

## 2017-09-22 ENCOUNTER — Ambulatory Visit: Payer: Managed Care, Other (non HMO) | Admitting: Vascular Surgery

## 2017-09-22 ENCOUNTER — Encounter: Payer: Self-pay | Admitting: Vascular Surgery

## 2017-09-22 VITALS — BP 113/68 | HR 56 | Temp 97.3°F | Resp 20 | Ht 69.0 in | Wt 200.0 lb

## 2017-09-22 DIAGNOSIS — I739 Peripheral vascular disease, unspecified: Secondary | ICD-10-CM

## 2017-09-22 DIAGNOSIS — M79606 Pain in leg, unspecified: Secondary | ICD-10-CM | POA: Diagnosis not present

## 2017-09-22 NOTE — Progress Notes (Signed)
Patient name: [name] MRN: [id number] DOB: 24-Nov-1950 Sex: male   REASON FOR CONSULT:    Discolored toes and calf claudication.  This was a referral through Epic.  HPI:   [name] is a pleasant 66 y.o. male, who several weeks ago developed discoloration of all of his toes on both feet.  This resolved in about a day.  He had not been in the cold.  He is unaware of any injury.  And the discoloration resolved completely.  He also noted some calf pain which is brought on by ambulation and relieved with rest.  However this also occurs when he is in bed at night.  His symptoms are more significant on the left side.  His risk factors for peripheral vascular disease include hypertension, hypercholesterolemia, and history of tobacco use.  He quit smoking 10 years ago.  He does continue to dip.  There is no family history of premature cardiovascular disease no history of diabetes.  He did have a myocardial infarction 10-15 years ago and had stents placed.  He does get occasional angina which is stable.  I reviewed the note from the referring office.  The patient was seen on 08/16/2017.  He reported that the toes had turned black for 2 days and that he was having some calf pain at times.  Therefore he was set up for vascular consultation.  Past Medical History:  Diagnosis Date  . Bradycardia   . Carotid artery disease ([place])    Right CEA;  dopplers 6/12:  0-39% bilat ICA  . Coronary artery disease    s/p BMS to RCA 2002; [place] 2008: oLAD 40%, pRCA stent ok with 20%, EF 60%); Myoview scan in March 2011 which was negative for ischemia   . GERD (gastroesophageal reflux disease)   . Hyperlipidemia   . Hypertension   . Myocardial infarction ([employer])   . Obstructive sleep apnea     Family History  Problem Relation Age of Onset  . Heart failure Father   . Heart disease Father   . Peripheral vascular disease Mother        with pacemaker, and had a carotid endarterectomy  . Heart disease Mother    . Hypertension Brother   . Hypertension Brother     SOCIAL HISTORY: He quit smoking 10 years ago.  He does continue to dip. Social History   Socioeconomic History  . Marital status: Married    Spouse name: Not on file  . Number of children: 1  . Years of education: 68  . Highest education level: Not on file  Social Needs  . Financial resource strain: Not on file  . Food insecurity - worry: Not on file  . Food insecurity - inability: Not on file  . Transportation needs - medical: Not on file  . Transportation needs - non-medical: Not on file  Occupational History  . Occupation: Sales  Tobacco Use  . Smoking status: Former Smoker    Packs/day: 1.00    Years: 30.00    Pack years: 30.00    Types: Cigarettes    Last attempt to quit: 10/19/2000    Years since quitting: 16.9  . Smokeless tobacco: Current User    Types: Snuff  Substance and Sexual Activity  . Alcohol use: Yes    Comment: Occasional beer  . Drug use: No  . Sexual activity: Not on file  Other Topics Concern  . Not on file  Social History Narrative  Fun: Hunt    Allergies  Allergen Reactions  . Felodipine Swelling  . Niacin     REACTION: not tolerated.    Current Outpatient Medications  Medication Sig Dispense Refill  . amLODipine (NORVASC) 10 MG tablet Take 1 tablet by mouth daily 90 tablet 1  . aspirin 81 MG tablet Take 81 mg by mouth daily.      Marland Kitchen atenolol (TENORMIN) 50 MG tablet Take 0.5 tablets (25 mg total) daily by mouth. 45 tablet 0  . clopidogrel (PLAVIX) 75 MG tablet Take 1 tablet (75 mg total) daily by mouth. 90 tablet 0  . doxazosin (CARDURA) 2 MG tablet     . ezetimibe (ZETIA) 10 MG tablet Take 1 tablet (10 mg total) daily by mouth. 90 tablet 0  . finasteride (PROSCAR) 5 MG tablet     . folic acid-pyridoxine-cyancobalamin (FOLBIC) 2.5-25-2 MG TABS tablet Take 1 tablet by mouth daily. 90 tablet 0  . hydrochlorothiazide (HYDRODIURIL) 50 MG tablet Take 0.5 tablets (25 mg total) daily by  mouth. 45 tablet 0  . isosorbide mononitrate (IMDUR) 30 MG 24 hr tablet Take 1 tablet (30 mg total) daily by mouth. 90 tablet 0  . nitroGLYCERIN (NITROSTAT) 0.4 MG SL tablet Place 1 tablet (0.4 mg total) every 5 (five) minutes as needed under the tongue. Call 9-1-1 if need more than 2. 25 tablet 0  . pantoprazole (PROTONIX) 40 MG tablet Take 1 tablet (40 mg total) daily by mouth. 90 tablet 0  . potassium chloride (K-DUR,KLOR-CON) 10 MEQ tablet TAKE 1 TABLET DAILY 90 tablet 0  . ramipril (ALTACE) 10 MG capsule Take 1 capsule by mouth twice daily. 180 capsule 0  . rosuvastatin (CRESTOR) 40 MG tablet Take 1 tablet (40 mg total) by mouth daily. 90 tablet 0   No current facility-administered medications for this visit.     REVIEW OF SYSTEMS:  [X]  denotes positive finding, [ ]  denotes negative finding Cardiac  Comments:  Chest pain or chest pressure: X   Shortness of breath upon exertion:    Short of breath when lying flat:    Irregular heart rhythm:        Vascular    Pain in calf, thigh, or hip brought on by ambulation: X   Pain in feet at night that wakes you up from your sleep:  X   Blood clot in your veins:    Leg swelling:  X       Pulmonary    Oxygen at home:    Productive cough:     Wheezing:         Neurologic    Sudden weakness in arms or legs:     Sudden numbness in arms or legs:     Sudden onset of difficulty speaking or slurred speech:    Temporary loss of vision in one eye:     Problems with dizziness:         Gastrointestinal    Blood in stool:     Vomited blood:         Genitourinary    Burning when urinating:     Blood in urine:        Psychiatric    Major depression:         Hematologic    Bleeding problems:    Problems with blood clotting too easily:        Skin    Rashes or ulcers:        Constitutional  Fever or chills:     PHYSICAL EXAM:   Vitals:   09/22/17 1057  BP: 113/68  Pulse: (!) 56  Resp: 20  Temp: (!) 97.3 F (36.3 C)    TempSrc: Oral  SpO2: 97%  Weight: 200 lb (90.7 kg)  Height: 5\' 9"  (1.753 m)    GENERAL: The patient is a well-nourished male, in no acute distress. The vital signs are documented above. CARDIAC: There is a regular rate and rhythm.  VASCULAR: I do not detect carotid bruits. On the right side, he has a palpable femoral, popliteal, dorsalis pedis, and posterior tibial pulse.  He has biphasic Doppler signals in the left foot. On the left side, he has a palpable femoral, popliteal, and posterior tibial pulse.  I cannot palpate a left dorsalis pedis pulse.  He does have biphasic Doppler signals in the left foot. He has no significant lower extremity swelling. PULMONARY: There is good air exchange bilaterally without wheezing or rales. ABDOMEN: Soft and non-tender with normal pitched bowel sounds.  I do not palpate an abdominal aortic aneurysm. MUSCULOSKELETAL: There are no major deformities or cyanosis. NEUROLOGIC: No focal weakness or paresthesias are detected. SKIN: There are no ulcers or rashes noted. PSYCHIATRIC: The patient has a normal affect.  DATA:    ARTERIAL DOPPLER STUDY: I reviewed his arterial Doppler study that was done on 08/18/2017.  On the right side, he has a triphasic dorsalis pedis and posterior tibial signal.  ABIs 100%.  Toe pressure on the right is 168 mmHg.  On the left side he has a triphasic dorsalis pedis and posterior tibial signal.  ABI is 100%.  Toe pressure on the left is 165 mmHg.  MEDICAL ISSUES:   BILATERAL CALF PAIN: His history is puzzling.  He does describe what sounds like bilateral calf claudication, however he also experiences the symptoms at rest when he is in bed at night.  He does not describe cramps.  He denies any significant back pain.  He feels that the pain is significantly disabling when he walks.  This is the discoloration in his toes resolved completely which would make me think this is less likely to be an embolic event although this would  be in the differential diagnosis.  He does not describe any thigh or hip claudication to suggest mild underlying aortoiliac occlusive disease that might be unmasked by an exercise study.  He is unaware of any injury to his legs and the symptoms did not sound like Raynaud's syndrome.  He is never had any severe cold exposure or frostbite in his feet.  Given that the etiology of his pain is not clear I have recommended we proceed with a CT angiogram to further evaluate him for underlying peripheral vascular disease which might explain his symptoms or put him at risk for atheroembolic disease.  I will see him back after his CT angiogram.  We will make further recommendations pending these results.  Deitra Mayo [facility] [phone]

## 2017-09-23 NOTE — Addendum Note (Signed)
Addended by: Lianne Cure A on: 09/23/2017 10:18 AM   Modules accepted: Orders

## 2017-10-21 ENCOUNTER — Encounter: Payer: Self-pay | Admitting: Nurse Practitioner

## 2017-10-21 ENCOUNTER — Ambulatory Visit: Payer: Managed Care, Other (non HMO) | Admitting: Nurse Practitioner

## 2017-10-21 VITALS — BP 114/68 | HR 57 | Temp 98.1°F | Resp 16 | Ht 69.0 in | Wt 201.0 lb

## 2017-10-21 DIAGNOSIS — M79605 Pain in left leg: Secondary | ICD-10-CM

## 2017-10-21 DIAGNOSIS — M79604 Pain in right leg: Secondary | ICD-10-CM

## 2017-10-21 NOTE — Patient Instructions (Addendum)
For your leg pain, I think it would be best to wait for the CT angiogram results before taking the next steps in treatment. Please follow up sooner for worsening symptoms, discoloration of your feet or legs.   Id like to see you back in about 1 month, after your CT scan follow up with Dr. Scot Dock, so I can see how you are doing.  It was nice to meet you. Thanks for letting me take care of you today :)

## 2017-10-21 NOTE — Progress Notes (Signed)
Subjective:    Patient ID: [name], male    DOB: Nov 24, 1950, 67 y.o.   MRN: [id number]  HPI Mr Kader presents today to establish care. He Is transferring to me from another provider in the same clinic. He presents today for leg pain. This is a subsequent encounter for his pain. The pain has been ongoing since October.  He was first seen for this problem in our clinic on 10/29. He had noted discoloration of his toes and feet with decreased dorsalis pedis and posterior tibial pulses. An order was placed for ABI testing and referral to vascular surgery was placed. He then saw vein and vascular surgery on 12/5 with unclear diagnosis for leg pain and a ct angiogram was ordered to be done on 11/10/17.  He says that In the meantime, he continues to have bilateral leg, foot and calf pain and intermittent swelling to his left ankle. He is on his feet walking at work all day. He hes not noticed any further discoloration of his toes. He reports good sensation in both feet. He denies any injuries to his lower extremities. He says that aside from feeling tired on some days, he feels pretty good. Denies fevers, weakness, falls, confusion, chest pain, shortness of breath, palpitations, nausea, vomiting, rashes, back pain, numbness, tingling. He is not a current tobacco smoker and denies recent travel, long periods of sitting, no history of cancer.  Review of Systems  See HPI  Past Medical History:  Diagnosis Date  . Bradycardia   . Carotid artery disease ([place])    Right CEA;  dopplers 6/12:  0-39% bilat ICA  . Coronary artery disease    s/p BMS to RCA 2002; [place] 2008: oLAD 40%, pRCA stent ok with 20%, EF 60%); Myoview scan in March 2011 which was negative for ischemia   . GERD (gastroesophageal reflux disease)   . Hyperlipidemia   . Hypertension   . Myocardial infarction ([place])   . Obstructive sleep apnea      Social History   Socioeconomic History  . Marital status: Married    Spouse name: Not on  file  . Number of children: 1  . Years of education: 49  . Highest education level: Not on file  Social Needs  . Financial resource strain: Not on file  . Food insecurity - worry: Not on file  . Food insecurity - inability: Not on file  . Transportation needs - medical: Not on file  . Transportation needs - non-medical: Not on file  Occupational History  . Occupation: Sales  Tobacco Use  . Smoking status: Former Smoker    Packs/day: 1.00    Years: 30.00    Pack years: 30.00    Types: Cigarettes    Last attempt to quit: 10/19/2000    Years since quitting: 17.0  . Smokeless tobacco: Current User    Types: Snuff  Substance and Sexual Activity  . Alcohol use: Yes    Comment: Occasional beer  . Drug use: No  . Sexual activity: Not on file  Other Topics Concern  . Not on file  Social History Narrative   Fun: [employer]    Past Surgical History:  Procedure Laterality Date  . CARDIAC CATHETERIZATION  06/01/2007    EF of 65% -- Nonobstructive CAD with 40% ostial stenosis in the LAD, no significant obstruction in the circumflex artery, 20% narrowing within the stent in the proximal to mid right coronary and normal LV function  . CARDIAC CATHETERIZATION  02/02/2006   EF of 50%  . CARDIAC CATHETERIZATION  01/19/2002   EF of 60%  . CARDIAC CATHETERIZATION N/A 12/03/2015   Procedure: Left Heart Cath and Coronary Angiography;  Surgeon: Burnell Blanks, MD;  Location: [facility];  Service: Cardiovascular;  Laterality: N/A;  . CAROTID ENDARTERECTOMY  06/26/2002   right  . CORONARY ANGIOPLASTY WITH STENT PLACEMENT  03/08/2000   Bare-metal stent in RCA, in [place]  . KNEE ARTHROSCOPY     right  . MOLE REMOVAL     Prior moles removed    Family History  Problem Relation Age of Onset  . Heart failure Father   . Heart disease Father   . Peripheral vascular disease Mother        with pacemaker, and had a carotid endarterectomy  . Heart disease Mother   . Hypertension  Brother   . Hypertension Brother     Allergies  Allergen Reactions  . Felodipine Swelling  . Niacin     REACTION: not tolerated.    Current Outpatient Medications on File Prior to Visit  Medication Sig Dispense Refill  . amLODipine (NORVASC) 10 MG tablet Take 1 tablet by mouth daily 90 tablet 1  . aspirin 81 MG tablet Take 81 mg by mouth daily.      Marland Kitchen atenolol (TENORMIN) 50 MG tablet Take 0.5 tablets (25 mg total) daily by mouth. 45 tablet 0  . clopidogrel (PLAVIX) 75 MG tablet Take 1 tablet (75 mg total) daily by mouth. 90 tablet 0  . doxazosin (CARDURA) 2 MG tablet     . ezetimibe (ZETIA) 10 MG tablet Take 1 tablet (10 mg total) daily by mouth. 90 tablet 0  . finasteride (PROSCAR) 5 MG tablet     . folic acid-pyridoxine-cyancobalamin (FOLBIC) 2.5-25-2 MG TABS tablet Take 1 tablet by mouth daily. 90 tablet 0  . hydrochlorothiazide (HYDRODIURIL) 50 MG tablet Take 0.5 tablets (25 mg total) daily by mouth. 45 tablet 0  . isosorbide mononitrate (IMDUR) 30 MG 24 hr tablet Take 1 tablet (30 mg total) daily by mouth. 90 tablet 0  . nitroGLYCERIN (NITROSTAT) 0.4 MG SL tablet Place 1 tablet (0.4 mg total) every 5 (five) minutes as needed under the tongue. Call 9-1-1 if need more than 2. 25 tablet 0  . pantoprazole (PROTONIX) 40 MG tablet Take 1 tablet (40 mg total) daily by mouth. 90 tablet 0  . potassium chloride (K-DUR,KLOR-CON) 10 MEQ tablet TAKE 1 TABLET DAILY 90 tablet 0  . ramipril (ALTACE) 10 MG capsule Take 1 capsule by mouth twice daily. 180 capsule 0  . rosuvastatin (CRESTOR) 40 MG tablet Take 1 tablet (40 mg total) by mouth daily. 90 tablet 0   No current facility-administered medications on file prior to visit.     BP 114/68 (BP Location: Right Arm, Patient Position: Sitting, Cuff Size: Large)   Pulse (!) 57   Temp 98.1 F (36.7 C) (Oral)   Resp 16   Ht 5\' 9"  (1.753 m)   Wt 201 lb (91.2 kg)   SpO2 96%   BMI 29.68 kg/m        Objective:   Physical Exam    Constitutional: He is oriented to person, place, and time. He appears well-developed and well-nourished. No distress.  HENT:  Head: Normocephalic and atraumatic.  Neck: Normal range of motion. Neck supple.  Cardiovascular: Normal rate, regular rhythm, normal heart sounds and intact distal pulses.  Pulses:      Dorsalis pedis pulses  are 2+ on the right side, and 2+ on the left side.       Posterior tibial pulses are 2+ on the right side, and 2+ on the left side.  Pulmonary/Chest: Effort normal and breath sounds normal.  Musculoskeletal: Normal range of motion. He exhibits no edema, tenderness or deformity.  Neurological: He is alert and oriented to person, place, and time. Coordination normal.  Skin: Skin is warm and dry. No rash noted. No erythema.  Psychiatric: He has a normal mood and affect. Judgment and thought content normal.  Vitals reviewed.      Assessment & Plan:  Pain in both lower extremities He does not appear to have any new findings on exam today. His symptoms have not worsened. Normal physical exam. We discussed awaiting CT angio with vascular provider follow up prior to additional testing or treatment, and he agrees. Return precautions given. He will return in 1 month for follow up

## 2017-10-26 ENCOUNTER — Other Ambulatory Visit: Payer: Self-pay | Admitting: Family

## 2017-11-10 ENCOUNTER — Ambulatory Visit: Payer: Managed Care, Other (non HMO) | Admitting: Vascular Surgery

## 2017-11-10 ENCOUNTER — Encounter: Payer: Self-pay | Admitting: Vascular Surgery

## 2017-11-10 ENCOUNTER — Ambulatory Visit
Admission: RE | Admit: 2017-11-10 | Discharge: 2017-11-10 | Disposition: A | Discharge status: Home / Self Care | Payer: Managed Care, Other (non HMO) | Source: Ambulatory Visit | Attending: Vascular Surgery | Admitting: Vascular Surgery

## 2017-11-10 VITALS — BP 120/67 | HR 49 | Temp 97.0°F | Resp 16 | Ht 69.0 in | Wt 194.0 lb

## 2017-11-10 DIAGNOSIS — I739 Peripheral vascular disease, unspecified: Secondary | ICD-10-CM

## 2017-11-10 DIAGNOSIS — M79606 Pain in leg, unspecified: Secondary | ICD-10-CM

## 2017-11-10 MED ORDER — IOPAMIDOL (ISOVUE-370) INJECTION 76%
125.0000 mL | Freq: Once | INTRAVENOUS | Status: AC | PRN
  Administered 2017-11-10: 125 mL via INTRAVENOUS

## 2017-11-10 NOTE — Progress Notes (Signed)
Patient name: [name] MRN: [id number] DOB: [birth year] Sex: male  REASON FOR VISIT:   Follow-up of peripheral vascular disease.  HPI:   [name] is a pleasant 67 y.o. male who I saw in consultation on 09/22/2017 with discoloration of the toes and calf claudication.  The patient had developed discoloration of all the toes on both feet.  This resolved fairly quickly.  He also describes some bilateral calf pain.  His noninvasive studies at rest showed normal ABIs with an ABI of 100%.  He had normal toe pressures bilaterally.  I felt that his history was puzzling.  Given that the discoloration of the toes resolved completely and fairly quickly I did not think this was an embolic event.  He did not describe any significant hip or thigh claudication to suggest underlying aortoiliac occlusive disease that might be unmasked by an exercise study.  Given that his symptoms were puzzling I recommended that we proceed with a CT angiogram to further evaluate him for underlying peripheral vascular disease.  He comes in today to discuss those results.  He has had no further discoloration of his toes.  He continues to describe some pain in his upper calves after work.  He is fairly active at work.  He denies any history of rest pain.  There have been no significant changes to his medical history.  Current Outpatient Medications  Medication Sig Dispense Refill  . amLODipine (NORVASC) 10 MG tablet Take 1 tablet by mouth daily 90 tablet 1  . aspirin 81 MG tablet Take 81 mg by mouth daily.      Marland Kitchen atenolol (TENORMIN) 50 MG tablet Take 0.5 tablets (25 mg total) daily by mouth. 45 tablet 0  . clopidogrel (PLAVIX) 75 MG tablet Take 1 tablet (75 mg total) daily by mouth. 90 tablet 0  . ezetimibe (ZETIA) 10 MG tablet Take 1 tablet (10 mg total) daily by mouth. 90 tablet 0  . finasteride (PROSCAR) 5 MG tablet     . folic acid-pyridoxine-cyancobalamin (FOLBIC) 2.5-25-2 MG TABS tablet Take 1 tablet by mouth daily. 90  tablet 0  . hydrochlorothiazide (HYDRODIURIL) 50 MG tablet Take 0.5 tablets (25 mg total) daily by mouth. 45 tablet 0  . isosorbide mononitrate (IMDUR) 30 MG 24 hr tablet Take 1 tablet (30 mg total) daily by mouth. 90 tablet 0  . nitroGLYCERIN (NITROSTAT) 0.4 MG SL tablet Place 1 tablet (0.4 mg total) every 5 (five) minutes as needed under the tongue. Call 9-1-1 if need more than 2. 25 tablet 0  . pantoprazole (PROTONIX) 40 MG tablet Take 1 tablet (40 mg total) daily by mouth. 90 tablet 0  . potassium chloride (K-DUR,KLOR-CON) 10 MEQ tablet TAKE 1 TABLET DAILY 90 tablet 0  . ramipril (ALTACE) 10 MG capsule Take 1 capsule by mouth twice daily. 180 capsule 0  . rosuvastatin (CRESTOR) 40 MG tablet Take 1 tablet (40 mg total) by mouth daily. 90 tablet 0   No current facility-administered medications for this visit.     REVIEW OF SYSTEMS:  [X]  denotes positive finding, [ ]  denotes negative finding Cardiac  Comments:  Chest pain or chest pressure:    Shortness of breath upon exertion:    Short of breath when lying flat:    Irregular heart rhythm:    Constitutional    Fever or chills:     PHYSICAL EXAM:   Vitals:   11/10/17 1453  BP: 120/67  Pulse: (!) 49  Resp: 16  Temp: (!)  26 F (36.1 C)  TempSrc: Oral  SpO2: 98%  Weight: 194 lb (88 kg)  Height: 5\' 9"  (1.753 m)    GENERAL: The patient is a well-nourished male, in no acute distress. The vital signs are documented above. CARDIOVASCULAR: There is a regular rate and rhythm. PULMONARY: There is good air exchange bilaterally without wheezing or rales. VASCULAR: I do not detect carotid bruits. On the right side, he has a palpable femoral, popliteal, dorsalis pedis, posterior tibial pulse. On the left side he has a palpable femoral, popliteal, and posterior tibial pulse.  I cannot palpate a dorsalis pedis pulse. He has no significant lower extremity swelling.  DATA:   CT ANGIOGRAM: The I have reviewed the images of his CT angiogram  that was done today.  This shows mild atherosclerotic changes in the distal thoracic aorta just above the aortic hiatus.  There is mild iliac and infrainguinal atherosclerotic changes with no focal stenosis or occlusion.  On the right, the anterior tibial and posterior tibial arteries are patent to the ankle and into the foot.  On the left side the posterior tibial artery is patent to the ankle and into the foot.  The anterior tibial and peroneal arteries are diminutive at the ankle.   MEDICAL ISSUES:   LEFT CALF PAIN: I think the pain in his calves is most likely musculoskeletal in origin.  He has essentially a normal arterial Doppler study, palpable pulses in both feet, and his CT angiogram shows no significant occlusive disease.  He has some mild atherosclerotic changes that do not appear to be clinically significant.  I do not think any further vascular workup is indicated.  I will be happy to see him back at any time if any new vascular issues arise.  LEFT ADRENAL ADENOMA: An incidental finding appears to be a small benign left adrenal adenoma.  He will discuss whether or not this needs continued follow-up with his primary care physician.  Deitra Mayo [facility] [phone]

## 2017-11-22 ENCOUNTER — Ambulatory Visit: Payer: Managed Care, Other (non HMO) | Admitting: Nurse Practitioner

## 2017-11-22 ENCOUNTER — Encounter: Payer: Self-pay | Admitting: Nurse Practitioner

## 2017-11-22 ENCOUNTER — Other Ambulatory Visit (INDEPENDENT_AMBULATORY_CARE_PROVIDER_SITE_OTHER): Payer: Managed Care, Other (non HMO)

## 2017-11-22 VITALS — BP 136/68 | HR 66 | Temp 98.1°F | Resp 16 | Ht 69.0 in | Wt 195.8 lb

## 2017-11-22 DIAGNOSIS — I10 Essential (primary) hypertension: Secondary | ICD-10-CM | POA: Diagnosis not present

## 2017-11-22 DIAGNOSIS — M79604 Pain in right leg: Secondary | ICD-10-CM | POA: Diagnosis not present

## 2017-11-22 DIAGNOSIS — K219 Gastro-esophageal reflux disease without esophagitis: Secondary | ICD-10-CM | POA: Diagnosis not present

## 2017-11-22 DIAGNOSIS — M79605 Pain in left leg: Secondary | ICD-10-CM | POA: Diagnosis not present

## 2017-11-22 DIAGNOSIS — E7800 Pure hypercholesterolemia, unspecified: Secondary | ICD-10-CM

## 2017-11-22 DIAGNOSIS — Z1211 Encounter for screening for malignant neoplasm of colon: Secondary | ICD-10-CM | POA: Diagnosis not present

## 2017-11-22 DIAGNOSIS — R350 Frequency of micturition: Secondary | ICD-10-CM | POA: Diagnosis not present

## 2017-11-22 LAB — BASIC METABOLIC PANEL
BUN: 19 mg/dL (ref 6–23)
CO2: 30 mEq/L (ref 19–32)
Calcium: 9.7 mg/dL (ref 8.4–10.5)
Chloride: 100 mEq/L (ref 96–112)
Creatinine, Ser: 1.33 mg/dL (ref 0.40–1.50)
GFR: 57.35 mL/min — ABNORMAL LOW (ref >60.00)
Glucose, Bld: 96 mg/dL (ref 70–99)
Potassium: 3.5 mEq/L (ref 3.5–5.1)
Sodium: 139 mEq/L (ref 135–145)

## 2017-11-22 LAB — POCT URINALYSIS DIPSTICK
Bilirubin, UA: NEGATIVE
Blood, UA: NEGATIVE
Glucose, UA: NEGATIVE
Ketones, UA: NEGATIVE
Leukocytes, UA: NEGATIVE
Nitrite, UA: NEGATIVE
Protein, UA: NEGATIVE
Spec Grav, UA: >=1.03 — AB (ref 1.010–1.025)
Urobilinogen, UA: NEGATIVE E.U./dL — AB
pH, UA: 6 (ref 5.0–8.0)

## 2017-11-22 MED ORDER — RAMIPRIL 10 MG PO CAPS: ORAL_CAPSULE | ORAL | 0 refills | Status: DC

## 2017-11-22 MED ORDER — EZETIMIBE 10 MG PO TABS: 10.0000 mg | ORAL_TABLET | Freq: Every day | ORAL | 0 refills | Status: DC

## 2017-11-22 NOTE — Assessment & Plan Note (Signed)
Stable, continue current medications - ezetimibe (ZETIA) 10 MG tablet; Take 1 tablet (10 mg total) by mouth daily.  Dispense: 90 tablet; Refill: 0

## 2017-11-22 NOTE — Patient Instructions (Addendum)
Please head downstairs for lab work  I have placed a referral to gastroenterology for your acid reflux and to discuss colon cancer screening. Our office will call you to schedule this appointment. You should hear from our office in 7-10 days.  Please call your urologist for follow up of your continued urinary symptoms. We will check your urine for an infection  It was good to see you. Thanks for letting me take care of you today :)   Urinary Tract Infection, Adult A urinary tract infection (UTI) is an infection of any part of the urinary tract. The urinary tract includes the:  Kidneys.  Ureters.  Bladder.  Urethra.  These organs make, store, and get rid of pee (urine) in the body. Follow these instructions at home:  Take over-the-counter and prescription medicines only as told by your doctor.  If you were prescribed an antibiotic medicine, take it as told by your doctor. Do not stop taking the antibiotic even if you start to feel better.  Avoid the following drinks: ? Alcohol. ? Caffeine. ? Tea. ? Carbonated drinks.  Drink enough fluid to keep your pee clear or pale yellow.  Keep all follow-up visits as told by your doctor. This is important.  Make sure to: ? Empty your bladder often and completely. Do not to hold pee for long periods of time. ? Empty your bladder before and after sex. ? Wipe from front to back after a bowel movement if you are male. Use each tissue one time when you wipe. Contact a doctor if:  You have back pain.  You have a fever.  You feel sick to your stomach (nauseous).  You throw up (vomit).  Your symptoms do not get better after 3 days.  Your symptoms go away and then come back. Get help right away if:  You have very bad back pain.  You have very bad lower belly (abdominal) pain.  You are throwing up and cannot keep down any medicines or water. This information is not intended to replace advice given to you by your health care  provider. Make sure you discuss any questions you have with your health care provider. Document Released: 03/23/2008 Document Revised: 03/12/2016 Document Reviewed: 08/26/2015 Elsevier Interactive Patient Education  [employer].

## 2017-11-22 NOTE — Assessment & Plan Note (Signed)
Stable, continue current medications - ramipril (ALTACE) 10 MG capsule; Take 1 capsule by mouth twice daily.  Dispense: 180 capsule; Refill: 0 - Basic metabolic panel; Future

## 2017-11-22 NOTE — Progress Notes (Signed)
Name: [name]   MRN: [id number]    DOB: Jan 01, 1951   Date:11/22/2017       Progress Note  Subjective  Chief Complaint [name] is following up today for leg pain. He is also requesting a refill of blood pressure and cholesterol medications and has an acute complaint of heartburn and dysuria.   HPI  Leg pain: He has had an extensive workup for leg pain and discoloration by Dr Scot Dock, vascular surgery, with normal arterial doppler studies, ABI's, and unremarkable CT angiogram over the past 2 months. He says he has started to take OTC vitamin B complex over the past month which has seemed to greatly improve his leg pain. He denies any swelling, discoloration of his legs today.  Hypertension -maintained on amlodipine 10, atenolol 25 daily, hctz 25 daily, ramipril 10 He also take Potassium 33meq at night for low potassium readings in the past. Reports daily medication compliance without adverse medication effects. He does not check his blood pressure routinely at home Denies headaches, vision changes, chest pain, shortness of breath, edema.  BP Readings from Last 3 Encounters:  11/22/17 136/68  11/10/17 120/67  10/21/17 114/68   Cholesterol- maintained on zetia 10, crestor 40 Reports daily medication compliance without adverse medication effects including myalgias.  Lab Results  Component Value Date   CHOL 117 04/22/2017   HDL 37.40 (L) 04/22/2017   LDLCALC 64 04/22/2017   TRIG 75.0 04/22/2017   CHOLHDL 3 04/22/2017   GERD- maintained on protonix 40 daily He does continue to have some heartburn and acid regurgitation- about two to three times a week,despite daily compliance with protonix He does like spicy foods and notices that this worsens his GERD symptoms.  Urinary frequency - He was referred to urology for elevated PSA in July-He was started on doxazosin and finasteride and prostate biopsies were obtained by urology. He reports he's had dysuria, painful erections and  ejaculatory dysfunction since the biopsies. He does not recall being told to follow back up with urology after the biopsies were done. He has also noticed bright yellow urine, but thinks this started after he began taking OTC vitamin b complex. Denies fevers, abdominal pain, nausea, vomiting, flank pain, hematuria.  Patient Active Problem List   Diagnosis Date Noted  . Shortness of breath 12/05/2015  . Hypersomnia with sleep apnea 12/05/2015  . Hypersomnia 12/05/2015  . Precordial pain   . Unstable angina ([place]) 12/02/2015  . Bilateral calf pain 09/27/2014  . Plantar fasciitis, bilateral 12/13/2012  . CAROTID ARTERY DISEASE 11/24/2010  . Other malaise and fatigue 12/11/2009  . BRADYCARDIA 04/12/2009  . Coronary atherosclerosis 12/25/2008  . Obstructive sleep apnea 02/03/2008  . MYOCARDIAL INFARCTION 02/03/2008  . Hyperlipidemia 10/08/2007  . Essential hypertension 10/08/2007  . GASTROESOPHAGEAL REFLUX DISEASE 10/08/2007    Past Surgical History:  Procedure Laterality Date  . CARDIAC CATHETERIZATION  06/01/2007    EF of 65% -- Nonobstructive CAD with 40% ostial stenosis in the LAD, no significant obstruction in the circumflex artery, 20% narrowing within the stent in the proximal to mid right coronary and normal LV function  . CARDIAC CATHETERIZATION  02/02/2006   EF of 50%  . CARDIAC CATHETERIZATION  01/19/2002   EF of 60%  . CARDIAC CATHETERIZATION N/A 12/03/2015   Procedure: Left Heart Cath and Coronary Angiography;  Surgeon: Burnell Blanks, MD;  Location: [facility];  Service: Cardiovascular;  Laterality: N/A;  . CAROTID ENDARTERECTOMY  06/26/2002   right  .  CORONARY ANGIOPLASTY WITH STENT PLACEMENT  03/08/2000   Bare-metal stent in RCA, in [place]  . KNEE ARTHROSCOPY     right  . MOLE REMOVAL     Prior moles removed    Family History  Problem Relation Age of Onset  . Heart failure Father   . Heart disease Father   . Peripheral vascular disease  Mother        with pacemaker, and had a carotid endarterectomy  . Heart disease Mother   . Hypertension Brother   . Hypertension Brother     Social History   Socioeconomic History  . Marital status: Married    Spouse name: Not on file  . Number of children: 1  . Years of education: 62  . Highest education level: Not on file  Social Needs  . Financial resource strain: Not on file  . Food insecurity - worry: Not on file  . Food insecurity - inability: Not on file  . Transportation needs - medical: Not on file  . Transportation needs - non-medical: Not on file  Occupational History  . Occupation: Sales  Tobacco Use  . Smoking status: Former Smoker    Packs/day: 1.00    Years: 30.00    Pack years: 30.00    Types: Cigarettes    Last attempt to quit: 10/19/2000    Years since quitting: 17.1  . Smokeless tobacco: Current User    Types: Snuff  . Tobacco comment: Dips snuff.   Substance and Sexual Activity  . Alcohol use: Yes    Comment: Occasional beer  . Drug use: No  . Sexual activity: Not on file  Other Topics Concern  . Not on file  Social History Narrative   Fun: [employer]     Current Outpatient Medications:  .  amLODipine (NORVASC) 10 MG tablet, Take 1 tablet by mouth daily, Disp: 90 tablet, Rfl: 1 .  aspirin 81 MG tablet, Take 81 mg by mouth daily.  , Disp: , Rfl:  .  atenolol (TENORMIN) 50 MG tablet, Take 0.5 tablets (25 mg total) daily by mouth., Disp: 45 tablet, Rfl: 0 .  b complex vitamins capsule, Take 1 capsule by mouth daily., Disp: , Rfl:  .  clopidogrel (PLAVIX) 75 MG tablet, Take 1 tablet (75 mg total) daily by mouth., Disp: 90 tablet, Rfl: 0 .  doxazosin (CARDURA) 2 MG tablet, Take 2 mg by mouth daily., Disp: , Rfl:  .  ezetimibe (ZETIA) 10 MG tablet, Take 1 tablet (10 mg total) by mouth daily., Disp: 90 tablet, Rfl: 0 .  finasteride (PROSCAR) 5 MG tablet, , Disp: , Rfl:  .  folic acid-pyridoxine-cyancobalamin (FOLBIC) 2.5-25-2 MG TABS tablet, Take 1 tablet  by mouth daily., Disp: 90 tablet, Rfl: 0 .  hydrochlorothiazide (HYDRODIURIL) 50 MG tablet, Take 0.5 tablets (25 mg total) daily by mouth., Disp: 45 tablet, Rfl: 0 .  isosorbide mononitrate (IMDUR) 30 MG 24 hr tablet, Take 1 tablet (30 mg total) daily by mouth., Disp: 90 tablet, Rfl: 0 .  nitroGLYCERIN (NITROSTAT) 0.4 MG SL tablet, Place 1 tablet (0.4 mg total) every 5 (five) minutes as needed under the tongue. Call 9-1-1 if need more than 2., Disp: 25 tablet, Rfl: 0 .  pantoprazole (PROTONIX) 40 MG tablet, Take 1 tablet (40 mg total) daily by mouth., Disp: 90 tablet, Rfl: 0 .  potassium chloride (K-DUR,KLOR-CON) 10 MEQ tablet, TAKE 1 TABLET DAILY, Disp: 90 tablet, Rfl: 0 .  ramipril (ALTACE) 10 MG capsule, Take 1 capsule  by mouth twice daily., Disp: 180 capsule, Rfl: 0 .  rosuvastatin (CRESTOR) 40 MG tablet, Take 1 tablet (40 mg total) by mouth daily., Disp: 90 tablet, Rfl: 0  Allergies  Allergen Reactions  . Felodipine Swelling  . Niacin     REACTION: not tolerated.     ROS See HPI  Objective  Vitals:   11/22/17 1548  BP: 136/68  Pulse: 66  Resp: 16  Temp: 98.1 F (36.7 C)  TempSrc: Oral  SpO2: 97%  Weight: 195 lb 12.8 oz (88.8 kg)  Height: 5\' 9"  (1.753 m)   Body mass index is 28.91 kg/m.  Physical Exam Constitutional: He is oriented to person, place, and time. He appears well-developed and well-nourished. No distress.  HENT:  Head: Normocephalic and atraumatic.  Neck: Normal range of motion. Neck supple.  Cardiovascular: Normal rate, regular rhythm, normal heart sounds and intact distal pulses. Pulmonary/Chest: Effort normal and breath sounds normal. No respiratory distress. Abdominal: Soft. No distension. There is no tenderness. Musculoskeletal: Normal range of motion. He exhibits no edema, tenderness or deformity.  Neurological: He is alert and oriented to person, place, and time. Coordination, balance, strength, speech and gait are normal.  Skin: Skin is warm and  dry. No rash noted. No erythema.  Psychiatric: He has a normal mood and affect. Judgment and thought content normal.  Vitals reviewed.  Recent Results (from the past 2160 hour(s))  POCT urinalysis dipstick     Status: Abnormal   Collection Time: 11/22/17  4:43 PM  Result Value Ref Range   Color, UA dark yellow    Clarity, UA clear    Glucose, UA negative    Bilirubin, UA negative    Ketones, UA negative    Spec Grav, UA >=1.030 (A) 1.010 - 1.025   Blood, UA negative    pH, UA 6.0 5.0 - 8.0   Protein, UA negative    Urobilinogen, UA negative (A) 0.2 or 1.0 E.U./dL   Nitrite, UA negative    Leukocytes, UA Negative Negative   Appearance     Odor      PHQ2/9: Depression screen PHQ 2/9 10/21/2017  Decreased Interest 0  Down, Depressed, Hopeless 0  PHQ - 2 Score 0    Fall Risk: Fall Risk  10/21/2017  Falls in the past year? No    Assessment & Plan RTC in about 3 months for follow up of chronic conditions  -Reviewed Health Maintenance:  Screening for colon cancer- Ambulatory referral to Gastroenterology  Urinary frequency We will test for UTI today He will call urology for follow up-he will let me know if he is not able to schedule a follow up with his urologist Return precautions discussed and printed in AVS, we also discussed that bright yellow urine could be due to vitamin B complex - POCT urinalysis dipstick-No indication of UTI on office dipstick today - Urine Culture; Future  Pain in both lower extremities Normal vascular workup recently Pain is improving and he is taking OTC vitamin B which seems to help as well We discussed daily exercise and leg stretching

## 2017-11-22 NOTE — Assessment & Plan Note (Signed)
Refractory to protonix. We discussed gerd lifestyle modifications and diet. He will continue protonix and follow up with gastroenterology for further evaluation - Ambulatory referral to Gastroenterology

## 2017-11-23 LAB — URINE CULTURE
MICRO NUMBER:: 90147163
Result:: NO GROWTH
SPECIMEN QUALITY:: ADEQUATE

## 2017-11-29 ENCOUNTER — Telehealth: Payer: Self-pay | Admitting: Nurse Practitioner

## 2017-11-29 MED ORDER — RAMIPRIL 10 MG PO CAPS: 10.0000 mg | ORAL_CAPSULE | Freq: Two times a day (BID) | ORAL | 0 refills | Status: DC

## 2017-11-29 NOTE — Telephone Encounter (Signed)
Copied from [place]. Topic: Quick Communication - Rx Refill/Question >> Nov 29, 2017 10:31 AM Robina Ade, Helene Kelp D wrote: Medication: ramipril (ALTACE) 10 MG capsule   Has the patient contacted their pharmacy? Yes, patient needs enough to last him until his mail order comes in.   (Agent: If no, request that the patient contact the pharmacy for the refill.)   Preferred Pharmacy (with phone number or street name): [facility] [address]   Agent: Please be advised that RX refills may take up to 3 business days. We ask that you follow-up with your pharmacy.

## 2017-11-29 NOTE — Telephone Encounter (Signed)
Refill of Ramipril given until mail order arrives.

## 2017-11-30 ENCOUNTER — Other Ambulatory Visit: Payer: Self-pay | Admitting: Nurse Practitioner

## 2017-12-03 ENCOUNTER — Encounter: Payer: Self-pay | Admitting: Gastroenterology

## 2018-01-05 ENCOUNTER — Other Ambulatory Visit: Payer: Self-pay | Admitting: Family

## 2018-01-05 ENCOUNTER — Other Ambulatory Visit: Payer: Self-pay | Admitting: Nurse Practitioner

## 2018-01-05 ENCOUNTER — Encounter: Payer: Self-pay | Admitting: Nurse Practitioner

## 2018-01-06 ENCOUNTER — Other Ambulatory Visit: Payer: Self-pay

## 2018-01-06 MED ORDER — POTASSIUM CHLORIDE CRYS ER 10 MEQ PO TBCR: 10.0000 meq | EXTENDED_RELEASE_TABLET | Freq: Every day | ORAL | 0 refills | Status: DC

## 2018-01-06 MED ORDER — ATENOLOL 50 MG PO TABS: 25.0000 mg | ORAL_TABLET | Freq: Every day | ORAL | 0 refills | Status: DC

## 2018-01-17 ENCOUNTER — Other Ambulatory Visit: Payer: Self-pay

## 2018-01-17 ENCOUNTER — Encounter: Payer: Self-pay | Admitting: Nurse Practitioner

## 2018-01-17 MED ORDER — POTASSIUM CHLORIDE CRYS ER 10 MEQ PO TBCR: 10.0000 meq | EXTENDED_RELEASE_TABLET | Freq: Every day | ORAL | 0 refills | Status: DC

## 2018-01-17 MED ORDER — ATENOLOL 50 MG PO TABS: 25.0000 mg | ORAL_TABLET | Freq: Every day | ORAL | 0 refills | Status: DC

## 2018-01-24 ENCOUNTER — Ambulatory Visit: Payer: Managed Care, Other (non HMO) | Admitting: Gastroenterology

## 2018-02-01 ENCOUNTER — Other Ambulatory Visit: Payer: Self-pay | Admitting: Nurse Practitioner

## 2018-02-01 NOTE — Telephone Encounter (Signed)
Please address medication refill. Brandon Shambley's patient

## 2018-02-16 ENCOUNTER — Other Ambulatory Visit: Payer: Self-pay | Admitting: Nurse Practitioner

## 2018-02-21 ENCOUNTER — Ambulatory Visit: Payer: Managed Care, Other (non HMO) | Admitting: Nurse Practitioner

## 2018-02-23 ENCOUNTER — Other Ambulatory Visit: Payer: Self-pay | Admitting: Nurse Practitioner

## 2018-02-23 DIAGNOSIS — I10 Essential (primary) hypertension: Secondary | ICD-10-CM

## 2018-02-23 DIAGNOSIS — E7800 Pure hypercholesterolemia, unspecified: Secondary | ICD-10-CM

## 2018-02-24 ENCOUNTER — Other Ambulatory Visit: Payer: Self-pay | Admitting: Nurse Practitioner

## 2018-02-28 ENCOUNTER — Telehealth: Payer: Self-pay | Admitting: Nurse Practitioner

## 2018-02-28 DIAGNOSIS — I10 Essential (primary) hypertension: Secondary | ICD-10-CM

## 2018-02-28 NOTE — Telephone Encounter (Signed)
Copied from [id number]. Topic: Quick Communication - Rx Refill/Question >> Feb 28, 2018 10:28 AM Neva Seat wrote: nitroGLYCERIN (NITROSTAT) 0.4 MG SL tablet atenolol (TENORMIN) 50 MG tablet isosorbide mononitrate (IMDUR) 30 MG 24 hr tablet clopidogrel (PLAVIX) 75 MG tablet amLODipine (NORVASC) 10 MG tablet doxazosin (CARDURA) 2 MG tablet pantoprazole (PROTONIX) 40 MG tablet  rosuvastatin (CRESTOR) 40 MG tablet finasteride (PROSCAR) 5 MG tablet  Pt needing refills on the above.  [facility] [place], [address] [place] [address] Phone: [phone] Fax: [phone] Not a 24 hour pharmacy; exact hours not known

## 2018-03-01 MED ORDER — AMLODIPINE BESYLATE 10 MG PO TABS: ORAL_TABLET | ORAL | 0 refills | Status: DC

## 2018-03-01 MED ORDER — PANTOPRAZOLE SODIUM 40 MG PO TBEC: 40.0000 mg | DELAYED_RELEASE_TABLET | Freq: Every day | ORAL | 0 refills | Status: DC

## 2018-03-01 MED ORDER — ATENOLOL 50 MG PO TABS: 25.0000 mg | ORAL_TABLET | Freq: Every day | ORAL | 1 refills | Status: DC

## 2018-03-01 MED ORDER — CLOPIDOGREL BISULFATE 75 MG PO TABS: 75.0000 mg | ORAL_TABLET | Freq: Every day | ORAL | 0 refills | Status: DC

## 2018-03-01 MED ORDER — ROSUVASTATIN CALCIUM 40 MG PO TABS: 40.0000 mg | ORAL_TABLET | Freq: Every day | ORAL | 0 refills | Status: DC

## 2018-03-01 MED ORDER — NITROGLYCERIN 0.4 MG SL SUBL: 0.4000 mg | SUBLINGUAL_TABLET | SUBLINGUAL | 0 refills | Status: DC | PRN

## 2018-03-01 MED ORDER — DOXAZOSIN MESYLATE 2 MG PO TABS: 2.0000 mg | ORAL_TABLET | Freq: Every day | ORAL | 0 refills | Status: DC

## 2018-03-01 MED ORDER — ISOSORBIDE MONONITRATE ER 30 MG PO TB24: 30.0000 mg | ORAL_TABLET | Freq: Every day | ORAL | 0 refills | Status: DC

## 2018-03-01 NOTE — Telephone Encounter (Signed)
LVM letting pt know all meds have been sent to [facility].

## 2018-03-01 NOTE — Telephone Encounter (Signed)
Pt. Reports he is having difficulty getting his medications from [facility]. He states they are not sending them or telling him they have not been called in. He wants ALL medications to go to [facility], [place].  Cassell Clement 12/02/17.

## 2018-03-02 ENCOUNTER — Encounter: Payer: Self-pay | Admitting: Nurse Practitioner

## 2018-03-02 MED ORDER — RAMIPRIL 10 MG PO CAPS: ORAL_CAPSULE | ORAL | 0 refills | Status: DC

## 2018-03-02 MED ORDER — HYDROCHLOROTHIAZIDE 50 MG PO TABS: 25.0000 mg | ORAL_TABLET | Freq: Every day | ORAL | 0 refills | Status: DC

## 2018-03-02 NOTE — Telephone Encounter (Signed)
Spoke with patient and he advised to send a 90 day of both [facility] instead of 14 day supply. Rxs have been sent.

## 2018-03-02 NOTE — Addendum Note (Signed)
Addended by: Delice Bison E on: 03/02/2018 11:38 AM   Modules accepted: Orders

## 2018-03-02 NOTE — Telephone Encounter (Signed)
Pts daughter called to say the meds [facility]     She says she needs new script for altace    And hydrochlorothiazide (HYDRODIURIL) 50 MG tablet  [facility] [place] [place]   However she needs  About 7 pills of  clopidogrel (PLAVIX) 75 MG tablet sent to [facility] - [place], [place] AT [address] [phone] (Phone) [phone] (Fax)     Daughter is asking that a message be placed in his mychart when this is completed

## 2018-03-02 NOTE — Telephone Encounter (Signed)
Please also send 14 day supply to [id number] - [place], [place] - [address] for the  ramipril (ALTACE) 10 MG capsule and hydrochlorothiazide (HYDRODIURIL) 50 MG tablet   He has been out of the medications for a couple of days

## 2018-03-03 ENCOUNTER — Other Ambulatory Visit: Payer: Self-pay | Admitting: Cardiovascular Disease

## 2018-03-04 ENCOUNTER — Encounter: Payer: Self-pay | Admitting: Nurse Practitioner

## 2018-03-04 ENCOUNTER — Ambulatory Visit: Payer: Managed Care, Other (non HMO) | Admitting: Nurse Practitioner

## 2018-03-04 ENCOUNTER — Other Ambulatory Visit (INDEPENDENT_AMBULATORY_CARE_PROVIDER_SITE_OTHER): Payer: Managed Care, Other (non HMO)

## 2018-03-04 VITALS — BP 130/74 | HR 57 | Temp 97.9°F | Resp 16 | Ht 69.0 in | Wt 198.0 lb

## 2018-03-04 DIAGNOSIS — M79605 Pain in left leg: Secondary | ICD-10-CM | POA: Diagnosis not present

## 2018-03-04 DIAGNOSIS — E7800 Pure hypercholesterolemia, unspecified: Secondary | ICD-10-CM | POA: Diagnosis not present

## 2018-03-04 DIAGNOSIS — M79604 Pain in right leg: Secondary | ICD-10-CM

## 2018-03-04 DIAGNOSIS — I10 Essential (primary) hypertension: Secondary | ICD-10-CM

## 2018-03-04 LAB — CBC
HCT: 40.8 % (ref 39.0–52.0)
Hemoglobin: 13.9 g/dL (ref 13.0–17.0)
MCHC: 34 g/dL (ref 30.0–36.0)
MCV: 89.2 fl (ref 78.0–100.0)
Platelets: 153 10*3/uL (ref 150.0–400.0)
RBC: 4.57 Mil/uL (ref 4.22–5.81)
RDW: 13.8 % (ref 11.5–15.5)
WBC: 5.5 10*3/uL (ref 4.0–10.5)

## 2018-03-04 LAB — TSH: TSH: 1.75 u[IU]/mL (ref 0.35–4.50)

## 2018-03-04 LAB — MAGNESIUM: Magnesium: 2.3 mg/dL (ref 1.5–2.5)

## 2018-03-04 LAB — VITAMIN B12: Vitamin B-12: 738 pg/mL (ref 211–911)

## 2018-03-04 MED ORDER — HYDROCHLOROTHIAZIDE 50 MG PO TABS: 25.0000 mg | ORAL_TABLET | Freq: Every day | ORAL | 1 refills | Status: DC

## 2018-03-04 MED ORDER — DOXAZOSIN MESYLATE 2 MG PO TABS: 2.0000 mg | ORAL_TABLET | Freq: Every day | ORAL | 1 refills | Status: DC

## 2018-03-04 MED ORDER — AMLODIPINE BESYLATE 10 MG PO TABS: ORAL_TABLET | ORAL | 1 refills | Status: DC

## 2018-03-04 MED ORDER — CLOPIDOGREL BISULFATE 75 MG PO TABS: 75.0000 mg | ORAL_TABLET | Freq: Every day | ORAL | 1 refills | Status: DC

## 2018-03-04 MED ORDER — RAMIPRIL 10 MG PO CAPS: ORAL_CAPSULE | ORAL | 1 refills | Status: DC

## 2018-03-04 MED ORDER — ISOSORBIDE MONONITRATE ER 30 MG PO TB24: 30.0000 mg | ORAL_TABLET | Freq: Every day | ORAL | 1 refills | Status: DC

## 2018-03-04 MED ORDER — PANTOPRAZOLE SODIUM 40 MG PO TBEC: 40.0000 mg | DELAYED_RELEASE_TABLET | Freq: Every day | ORAL | 1 refills | Status: DC

## 2018-03-04 MED ORDER — EZETIMIBE 10 MG PO TABS: 10.0000 mg | ORAL_TABLET | Freq: Every day | ORAL | 1 refills | Status: DC

## 2018-03-04 MED ORDER — ROSUVASTATIN CALCIUM 40 MG PO TABS: 40.0000 mg | ORAL_TABLET | Freq: Every day | ORAL | 1 refills | Status: DC

## 2018-03-04 MED ORDER — GABAPENTIN 300 MG PO CAPS: 300.0000 mg | ORAL_CAPSULE | Freq: Every day | ORAL | 1 refills | Status: DC

## 2018-03-04 MED ORDER — ATENOLOL 50 MG PO TABS: 25.0000 mg | ORAL_TABLET | Freq: Every day | ORAL | 1 refills | Status: DC

## 2018-03-04 NOTE — Progress Notes (Signed)
Name: [name]   MRN: [id number]    DOB: 02/18/1951   Date:03/04/2018       Progress Note  Subjective  Chief Complaint  Chief Complaint  Patient presents with  . Follow-up    Blood pressure, bilateral leg pain    HPI Since our last OV, he has scheduled with gastroenterology for screening colonoscopy and GERD F/U with upcoming appointment on 5/24. He has been following with cardiology routinely for coronary artery disease, HTN, HLD, bilateral carotid artery disease, and unstable angina but has not been seen since 01/2016, he says he never received an appointment reminder to follow back up with cardiology. He complains of continued leg pain today, first discussed at our last OV.  We discussed leg pain at his last office visit on 11/22/17 with the following note: He has had an extensive workup for leg pain and discoloration by Dr Scot Dock, vascular surgery, with normal arterial doppler studies, ABI's, and unremarkable CT angiogram over the past 2 months. He says he has started to take OTC vitamin B complex over the past month which has seemed to greatly improve his leg pain. He denies any swelling, discoloration of his legs today.  Today: He says his legs are still aching and "feel tight" daily. The pain occurs in the back of both of his legs, and does not seem related to time of day. He reports his job requires him to stand and walk for about 11 hours per day on his feet He denies weakness, fevers, chest pain, shortness of breath, numbness, tingling, edema, erythema.   Patient Active Problem List   Diagnosis Date Noted  . Hypersomnia with sleep apnea 12/05/2015  . Unstable angina ([place]) 12/02/2015  . Plantar fasciitis, bilateral 12/13/2012  . CAROTID ARTERY DISEASE 11/24/2010  . BRADYCARDIA 04/12/2009  . Coronary atherosclerosis 12/25/2008  . Obstructive sleep apnea 02/03/2008  . MYOCARDIAL INFARCTION 02/03/2008  . Hyperlipidemia 10/08/2007  . Essential hypertension 10/08/2007  .  GASTROESOPHAGEAL REFLUX DISEASE 10/08/2007    Past Surgical History:  Procedure Laterality Date  . CARDIAC CATHETERIZATION  06/01/2007    EF of 65% -- Nonobstructive CAD with 40% ostial stenosis in the LAD, no significant obstruction in the circumflex artery, 20% narrowing within the stent in the proximal to mid right coronary and normal LV function  . CARDIAC CATHETERIZATION  02/02/2006   EF of 50%  . CARDIAC CATHETERIZATION  01/19/2002   EF of 60%  . CARDIAC CATHETERIZATION N/A 12/03/2015   Procedure: Left Heart Cath and Coronary Angiography;  Surgeon: Burnell Blanks, MD;  Location: [facility];  Service: Cardiovascular;  Laterality: N/A;  . CAROTID ENDARTERECTOMY  06/26/2002   right  . CORONARY ANGIOPLASTY WITH STENT PLACEMENT  03/08/2000   Bare-metal stent in RCA, in [place]  . KNEE ARTHROSCOPY     right  . MOLE REMOVAL     Prior moles removed    Family History  Problem Relation Age of Onset  . Heart failure Father   . Heart disease Father   . Peripheral vascular disease Mother        with pacemaker, and had a carotid endarterectomy  . Heart disease Mother   . Hypertension Brother   . Hypertension Brother     Social History   Socioeconomic History  . Marital status: Married    Spouse name: Not on file  . Number of children: 1  . Years of education: 11  . Highest education level: Not on file  Occupational History  . Occupation: Press photographer  Social Needs  . Financial resource strain: Not on file  . Food insecurity:    Worry: Not on file    Inability: Not on file  . Transportation needs:    Medical: Not on file    Non-medical: Not on file  Tobacco Use  . Smoking status: Former Smoker    Packs/day: 1.00    Years: 30.00    Pack years: 30.00    Types: Cigarettes    Last attempt to quit: 10/19/2000    Years since quitting: 17.3  . Smokeless tobacco: Current User    Types: Snuff  . Tobacco comment: Dips snuff.   Substance and Sexual Activity  .  Alcohol use: Yes    Comment: Occasional beer  . Drug use: No  . Sexual activity: Not on file  Lifestyle  . Physical activity:    Days per week: Not on file    Minutes per session: Not on file  . Stress: Not on file  Relationships  . Social connections:    Talks on phone: Not on file    Gets together: Not on file    Attends religious service: Not on file    Active member of club or organization: Not on file    Attends meetings of clubs or organizations: Not on file    Relationship status: Not on file  . Intimate partner violence:    Fear of current or ex partner: Not on file    Emotionally abused: Not on file    Physically abused: Not on file    Forced sexual activity: Not on file  Other Topics Concern  . Not on file  Social History Narrative   Fun: [employer]     Current Outpatient Medications:  .  aspirin 81 MG tablet, Take 81 mg by mouth daily.  , Disp: , Rfl:  .  b complex vitamins capsule, Take 1 capsule by mouth daily., Disp: , Rfl:  .  finasteride (PROSCAR) 5 MG tablet, , Disp: , Rfl:  .  folic acid-pyridoxine-cyancobalamin (FOLBIC) 2.5-25-2 MG TABS tablet, Take 1 tablet by mouth daily., Disp: 90 tablet, Rfl: 0 .  nitroGLYCERIN (NITROSTAT) 0.4 MG SL tablet, Place 1 tablet (0.4 mg total) under the tongue every 5 (five) minutes as needed. Call 9-1-1 if need more than 2., Disp: 25 tablet, Rfl: 0 .  potassium chloride (K-DUR,KLOR-CON) 10 MEQ tablet, Take 1 tablet (10 mEq total) by mouth daily., Disp: 30 tablet, Rfl: 0 .  ramipril (ALTACE) 10 MG capsule, Take 1 capsule (10 mg total) by mouth 2 (two) times daily., Disp: 14 capsule, Rfl: 0 .  rosuvastatin (CRESTOR) 40 MG tablet, TAKE 1 TABLET DAILY, Disp: 30 tablet, Rfl: 0 .  amLODipine (NORVASC) 10 MG tablet, Take 1 tablet by mouth daily, Disp: 90 tablet, Rfl: 1 .  atenolol (TENORMIN) 50 MG tablet, Take 0.5 tablets (25 mg total) by mouth daily., Disp: 45 tablet, Rfl: 1 .  clopidogrel (PLAVIX) 75 MG tablet, Take 1 tablet (75 mg  total) by mouth daily., Disp: 90 tablet, Rfl: 1 .  doxazosin (CARDURA) 2 MG tablet, Take 1 tablet (2 mg total) by mouth daily., Disp: 180 tablet, Rfl: 1 .  ezetimibe (ZETIA) 10 MG tablet, Take 1 tablet (10 mg total) by mouth daily. Annual appt due in July must see provider for future refills, Disp: 90 tablet, Rfl: 1 .  hydrochlorothiazide (HYDRODIURIL) 50 MG tablet, Take 0.5 tablets (25 mg total) by mouth daily., Disp:  45 tablet, Rfl: 1 .  isosorbide mononitrate (IMDUR) 30 MG 24 hr tablet, Take 1 tablet (30 mg total) by mouth daily., Disp: 90 tablet, Rfl: 1 .  pantoprazole (PROTONIX) 40 MG tablet, Take 1 tablet (40 mg total) by mouth daily., Disp: 90 tablet, Rfl: 1 .  ramipril (ALTACE) 10 MG capsule, TAKE 1 CAPSULE TWICE A DAY, Disp: 180 capsule, Rfl: 1 .  rosuvastatin (CRESTOR) 40 MG tablet, Take 1 tablet (40 mg total) by mouth daily., Disp: 90 tablet, Rfl: 1  Allergies  Allergen Reactions  . Felodipine Swelling  . Niacin     REACTION: not tolerated.     ROS SeE HPI  Objective  Vitals:   03/04/18 1551  BP: 130/74  Pulse: (!) 57  Resp: 16  Temp: 97.9 F (36.6 C)  TempSrc: Oral  SpO2: 96%  Weight: 198 lb (89.8 kg)  Height: 5\' 9"  (1.753 m)    Body mass index is 29.24 kg/m.  Physical Exam Vital signs reviewed. Constitutional: He isoriented to person, place, and time. He appearswell-developedand well-nourished.No distress.  HENT:  Head:Normocephalicand atraumatic.  Neck:Normal range of motion.Neck supple.  Cardiovascular:Normal rate,regular rhythm,normal heart soundsand intact distal pulses. Pulmonary/Chest: Effort normal and breath sounds normal. No respiratory distress. Musculoskeletal:Normal range of motion. He exhibits noedema,tendernessor deformity.  Neurological: He is alert and oriented to person, place, and time. Coordination, balance, strength, speech and gait are normal.  Skin: Skin iswarmand dry.No rashnoted. No erythema.  Psychiatric: He  has anormal mood and affect.Judgmentand thought contentnormal.   Assessment & Plan RTC in 1 month for F/U: leg pain- starting neurontin, referral to neurology, vitamin D level; CPE if stable  Pain in both lower extremities Will start gabapentin trial for leg pain- dosing and side effects discussed RTC in 1 month for F/U - Ambulatory referral to Neurology - gabapentin (NEURONTIN) 300 MG capsule; Take 1 capsule (300 mg total) by mouth at bedtime.  Dispense: 30 capsule; Refill: 1 -BMET done 12/02/17 showed decreased GFR, otherwise WNL  - CBC; Future - Vitamin B12; Future - Magnesium; Future - TSH; Future -will also try to add on Vitamin D level- if unable will obtain at next OV

## 2018-03-04 NOTE — Patient Instructions (Addendum)
Please head downstairs for lab work/x-rays. If any of your test results are critically abnormal, you will be contacted right away. Your results may be released to your MyChart for viewing before I am able to provide you with my response. I will contact you within a week about your test results and any recommendations for abnormalities.  Please start gabapentin 300 mg once daily at bedtime for your leg pain.  Please call your cardiology office to schedule regular follow up: Juliet Rude MD  Doctor in [place], [facility]  Address: [address], [place], [place] [address]  Phone: [phone]  I have placed a referral to neurology for further evaluation of your leg pain. Our office will call you to schedule this appointment. You should hear from our office in 7-10 days.  It was nice to see you. Thanks for letting me take care of you today :)

## 2018-03-06 ENCOUNTER — Encounter: Payer: Self-pay | Admitting: Nurse Practitioner

## 2018-03-06 NOTE — Assessment & Plan Note (Signed)
Labs are up to date Continue current meds Instructed to call cardiology to schedule annual follow up If labs and neurology workup are negative, could consider statin as source of leg pain, but will continue statin for now due to hx MI while completing additional workup - ezetimibe (ZETIA) 10 MG tablet; Take 1 tablet (10 mg total) by mouth daily. Annual appt due in July must see provider for future refills  Dispense: 90 tablet; Refill: 1

## 2018-03-06 NOTE — Assessment & Plan Note (Signed)
Labs are up to date Continue current meds Instructed to call cardiology for annual follow up - ramipril (ALTACE) 10 MG capsule; TAKE 1 CAPSULE TWICE A DAY  Dispense: 180 capsule; Refill: 1 - CBC; Future

## 2018-03-11 ENCOUNTER — Ambulatory Visit: Payer: Managed Care, Other (non HMO) | Admitting: Gastroenterology

## 2018-03-16 ENCOUNTER — Encounter: Payer: Self-pay | Admitting: Neurology

## 2018-03-16 ENCOUNTER — Encounter: Payer: Self-pay | Admitting: Nurse Practitioner

## 2018-03-17 ENCOUNTER — Other Ambulatory Visit: Payer: Self-pay | Admitting: Nurse Practitioner

## 2018-03-17 DIAGNOSIS — M79604 Pain in right leg: Secondary | ICD-10-CM

## 2018-03-17 DIAGNOSIS — M79605 Pain in left leg: Principal | ICD-10-CM

## 2018-03-17 MED ORDER — GABAPENTIN 300 MG PO CAPS: 300.0000 mg | ORAL_CAPSULE | Freq: Two times a day (BID) | ORAL | 1 refills | Status: DC

## 2018-03-29 ENCOUNTER — Encounter: Payer: Self-pay | Admitting: Physician Assistant

## 2018-03-29 ENCOUNTER — Ambulatory Visit: Payer: Managed Care, Other (non HMO) | Admitting: Physician Assistant

## 2018-03-29 ENCOUNTER — Telehealth: Payer: Self-pay | Admitting: *Deleted

## 2018-03-29 VITALS — BP 108/62 | HR 72 | Ht 69.0 in | Wt 199.0 lb

## 2018-03-29 DIAGNOSIS — K219 Gastro-esophageal reflux disease without esophagitis: Secondary | ICD-10-CM | POA: Diagnosis not present

## 2018-03-29 DIAGNOSIS — Z1211 Encounter for screening for malignant neoplasm of colon: Secondary | ICD-10-CM

## 2018-03-29 MED ORDER — PANTOPRAZOLE SODIUM 40 MG PO TBEC: DELAYED_RELEASE_TABLET | ORAL | 3 refills | Status: DC

## 2018-03-29 NOTE — Progress Notes (Signed)
Thank you for sending this case to me. I have reviewed the entire note, and the outlined plan seems appropriate.   Anjolina Byrer Danis, MD  

## 2018-03-29 NOTE — Telephone Encounter (Signed)
[facility] Pre-operative Risk Assessment     Request for surgical clearance:     Endoscopy/Colonoscopy Procedure  What type of surgery is being performed?     COLON/EGD  When is this surgery scheduled?     05-12-2018  What type of clearance is required ?   Pharmacy  Are there any medications that need to be held prior to surgery and how long? Plavix/ 5 days  Practice name and name of physician performing surgery?      [facility]  What is your office phone and fax number?      Phone- [phone]  [phone]  Anesthesia type (None, local, MAC, general) ?       MAC

## 2018-03-29 NOTE — Patient Instructions (Signed)
You have been scheduled for an endoscopy and colonoscopy. Please follow the written instructions given to you at your visit today. We have provided you with a prep sample for the colonoscopy. If you use inhalers (even only as needed), please bring them with you on the day of your procedure. Your physician has requested that you go to www.startemmi.com and enter the access code given to you at your visit today. This web site gives a general overview about your procedure. However, you should still follow specific instructions given to you by our office regarding your preparation for the procedure.  We sent a refills for the Protonix 40 mg . We have also provided you with reflux information.

## 2018-03-29 NOTE — Progress Notes (Signed)
[facility] Pre-operative Risk Assessment     Request for surgical clearance:     Endoscopy Procedure  What type of surgery is being performed?     Colon/Endo When is this surgery scheduled?     05-12-2018  What type of clearance is required ?   Pharmacy  Are there any medications that need to be held prior to surgery and how long? Plavix- hold for 5 days prior to procedure date.  Practice name and name of physician performing surgery?      [facility]  What is your office phone and fax number?      Phone- [phone]  [phone]  Anesthesia type (None, local, MAC, general) ?       MAC

## 2018-03-29 NOTE — Progress Notes (Signed)
Subjective:    Patient ID: [name], male    DOB: [birth year], 67 y.o.   MRN: [id number]  HPI [name] is a pleasant 67 year old white male, new to GI today referred by Brandon Few NP/primary care [place] for screening colonoscopy and also to discuss chronic heartburn. Patient has not had any prior GI evaluation.  He does have history of coronary artery disease is status post stent in 2017, also peripheral artery disease with previous carotid stenting, and sleep apnea. Patient has no current lower GI symptoms specifically no complaints of abdominal pain, changes in bowel habits melena or hematochezia.  Family history is negative for colon cancer and polyps. He says he has had problems with regular heartburn and indigestion over the past Reid years.  He is currently taking Protonix 40 mg p.o. daily which he says is quite helpful.  He does get intermittent burning in his subxiphoid area, generally no nocturnal symptoms.  Denies any abdominal pain or nausea.  He has not had any dysphagia or odynophagia. He is maintained on Plavix and aspirin and followed by Dr. Angelena Reid.  Review of Systems Pertinent positive and negative review of systems were noted in the above HPI section.  All other review of systems was otherwise negative.  Outpatient Encounter Medications as of 03/29/2018  Medication Sig  . amLODipine (NORVASC) 10 MG tablet Take 1 tablet by mouth daily  . aspirin 81 MG tablet Take 81 mg by mouth daily.    Marland Kitchen atenolol (TENORMIN) 50 MG tablet Take 0.5 tablets (25 mg total) by mouth daily.  Marland Kitchen b complex vitamins capsule Take 1 capsule by mouth daily.  . clopidogrel (PLAVIX) 75 MG tablet Take 1 tablet (75 mg total) by mouth daily.  Marland Kitchen doxazosin (CARDURA) 2 MG tablet Take 1 tablet (2 mg total) by mouth daily.  Marland Kitchen ezetimibe (ZETIA) 10 MG tablet Take 1 tablet (10 mg total) by mouth daily. Annual appt due in July must see provider for future refills  . finasteride (PROSCAR) 5 MG tablet   . gabapentin  (NEURONTIN) 300 MG capsule Take 1 capsule (300 mg total) by mouth 2 (two) times daily.  . hydrochlorothiazide (HYDRODIURIL) 50 MG tablet Take 0.5 tablets (25 mg total) by mouth daily.  . isosorbide mononitrate (IMDUR) 30 MG 24 hr tablet Take 1 tablet (30 mg total) by mouth daily.  . nitroGLYCERIN (NITROSTAT) 0.4 MG SL tablet Place 1 tablet (0.4 mg total) under the tongue every 5 (five) minutes as needed. Call 9-1-1 if need more than 2.  . pantoprazole (PROTONIX) 40 MG tablet Take 1 tablet by mouth every morning.  . potassium chloride (K-DUR,KLOR-CON) 10 MEQ tablet Take 1 tablet (10 mEq total) by mouth daily.  . ramipril (ALTACE) 10 MG capsule TAKE 1 CAPSULE TWICE A DAY  . rosuvastatin (CRESTOR) 40 MG tablet Take 1 tablet (40 mg total) by mouth daily.  . [DISCONTINUED] pantoprazole (PROTONIX) 40 MG tablet Take 1 tablet (40 mg total) by mouth daily.  . [DISCONTINUED] folic acid-pyridoxine-cyancobalamin (FOLBIC) 2.5-25-2 MG TABS tablet Take 1 tablet by mouth daily.  . [DISCONTINUED] ramipril (ALTACE) 10 MG capsule Take 1 capsule (10 mg total) by mouth 2 (two) times daily.  . [DISCONTINUED] rosuvastatin (CRESTOR) 40 MG tablet TAKE 1 TABLET DAILY   No facility-administered encounter medications on file as of 03/29/2018.    Allergies  Allergen Reactions  . Felodipine Swelling  . Niacin     REACTION: not tolerated.   Patient Active Problem List   Diagnosis Date  Noted  . Hypersomnia with sleep apnea 12/05/2015  . Unstable angina ([place]) 12/02/2015  . Plantar fasciitis, bilateral 12/13/2012  . CAROTID ARTERY DISEASE 11/24/2010  . BRADYCARDIA 04/12/2009  . Coronary atherosclerosis 12/25/2008  . Obstructive sleep apnea 02/03/2008  . MYOCARDIAL INFARCTION 02/03/2008  . Hyperlipidemia 10/08/2007  . Essential hypertension 10/08/2007  . GASTROESOPHAGEAL REFLUX DISEASE 10/08/2007   Social History   Socioeconomic History  . Marital status: Married    Spouse name: Not on file  . Number of  children: 1  . Years of education: 89  . Highest education level: Not on file  Occupational History  . Occupation: Press photographer  Social Needs  . Financial resource strain: Not on file  . Food insecurity:    Worry: Not on file    Inability: Not on file  . Transportation needs:    Medical: Not on file    Non-medical: Not on file  Tobacco Use  . Smoking status: Former Smoker    Packs/day: 1.00    Years: 30.00    Pack years: 30.00    Types: Cigarettes    Last attempt to quit: 10/19/2000    Years since quitting: 17.4  . Smokeless tobacco: Current User    Types: Snuff  . Tobacco comment: Dips snuff.   Substance and Sexual Activity  . Alcohol use: Yes    Comment: Occasional beer  . Drug use: No  . Sexual activity: Not on file  Lifestyle  . Physical activity:    Days per week: Not on file    Minutes per session: Not on file  . Stress: Not on file  Relationships  . Social connections:    Talks on phone: Not on file    Gets together: Not on file    Attends religious service: Not on file    Active member of club or organization: Not on file    Attends meetings of clubs or organizations: Not on file    Relationship status: Not on file  . Intimate partner violence:    Fear of current or ex partner: Not on file    Emotionally abused: Not on file    Physically abused: Not on file    Forced sexual activity: Not on file  Other Topics Concern  . Not on file  Social History Narrative   Fun: [employer]    [name]'s family history includes Heart disease in his father and mother; Heart failure in his father; Hypertension in his brother and brother; Peripheral vascular disease in his mother.      Objective:    Vitals:   03/29/18 0854  BP: 108/62  Pulse: 72    Physical Exam; well-developed older white male in no acute distress, pleasant accompanied by his wife blood pressure 108/62, pulse 72, height 5 foot 9, weight 199, BMI 29.3.  HEENT ;nontraumatic normocephalic EOMI PERRLA sclera  anicteric oropharynx clear, Cardiovascular; regular rate and rhythm with S1-S2 no murmur rub or gallop, Pulmonary ;clear bilaterally, Abdomen ;soft, nontender nondistended bowel sounds are active there is no palpable mass or hepatosplenomegaly, Rectal; exam not done, Extremities; no clubbing cyanosis or edema skin warm dry, Neuro psych; mood and affect appropriate       Assessment & Plan:   #74 67 year old white male referred for colon cancer screening.  He is average risk and asymptomatic- no prior colonoscopy #2 chronic GERD fairly well controlled on Protonix 40 mg p.o. daily-rule out Barrett's #3 coronary artery disease status post stent 2017 #4.  Peripheral arterial  disease status post carotid stent #5.  Sleep apnea #6.  Chronic antiplatelet therapy-on Plavix and aspirin  Plan; continue Protonix 40 mg p.o. every morning, refill sent. I reviewed an antireflux regimen and he was given a copy of an antireflux regimen and an antireflux diet. Patient will be scheduled for upper endoscopy and colonoscopy with Dr. Loletha Carrow.  Both procedures were discussed in detail with the patient including indications risks and benefits and he is agreeable to proceed. He will need to hold Plavix for 5 days prior to  procedures.  We will communicate with his cardiologist Dr. Angelena Reid to assure that this is reasonable for this patient.  Carder Yin Genia Harold PA-C 03/29/2018   Cc: Lance Sell, NP

## 2018-03-30 NOTE — Telephone Encounter (Signed)
Pharm D, disregard. ? Is regarding  Plavix. I will route to MD for clearance.

## 2018-03-30 NOTE — Telephone Encounter (Signed)
Spoke with pt re: surgical clearance we received from [place] re: EGD/COLONOSCOPY.  Pt understands that he will need to be seen before he can be cleared for this procedure and has been scheduled to see Ermalinda Barrios, PA-C 04-26-18.  Will fwd to [place] for their information.

## 2018-03-30 NOTE — Telephone Encounter (Signed)
   Primary Cardiologist: Dr. Angelena Form  Chart reviewed as part of pre-operative protocol coverage. Because of [name]'s past medical history and time since last visit, he/she will require a follow-up visit in order to better assess preoperative cardiovascular risk.  Pre-op covering staff: - Please schedule appointment and call patient to inform them. - Please contact requesting surgeon's office via preferred method (i.e, phone, fax) to inform them of need for appointment prior to surgery.  Will also route to Dr. Angelena Form to see if ok for pt to hold Plavix x 5 days.   Lyda Jester, PA-C  03/30/2018, 2:27 PM

## 2018-04-01 ENCOUNTER — Encounter: Payer: Self-pay | Admitting: Neurology

## 2018-04-01 ENCOUNTER — Ambulatory Visit: Payer: Managed Care, Other (non HMO) | Admitting: Neurology

## 2018-04-01 ENCOUNTER — Other Ambulatory Visit (INDEPENDENT_AMBULATORY_CARE_PROVIDER_SITE_OTHER): Payer: Managed Care, Other (non HMO)

## 2018-04-01 VITALS — BP 110/64 | HR 53 | Ht 69.0 in | Wt 198.2 lb

## 2018-04-01 DIAGNOSIS — M79605 Pain in left leg: Secondary | ICD-10-CM

## 2018-04-01 DIAGNOSIS — R292 Abnormal reflex: Secondary | ICD-10-CM

## 2018-04-01 DIAGNOSIS — M79604 Pain in right leg: Secondary | ICD-10-CM

## 2018-04-01 DIAGNOSIS — Z131 Encounter for screening for diabetes mellitus: Secondary | ICD-10-CM

## 2018-04-01 LAB — SEDIMENTATION RATE: Sed Rate: 11 mm/hr (ref 0–20)

## 2018-04-01 LAB — FOLATE: Folate: >24.1 ng/mL (ref >5.9)

## 2018-04-01 LAB — CK: Total CK: 112 U/L (ref 7–232)

## 2018-04-01 LAB — HEMOGLOBIN A1C: Hgb A1c MFr Bld: 6.2 % (ref 4.6–6.5)

## 2018-04-01 MED ORDER — GABAPENTIN 300 MG PO CAPS: ORAL_CAPSULE | ORAL | 11 refills | Status: DC

## 2018-04-01 NOTE — Patient Instructions (Addendum)
Take gabapentin 1 tablet in the morning and 2 tablets at bedtime  MRI lumbar spine wo contrast  Check labs  We will call you with the results   Return to clinic in 4 months

## 2018-04-01 NOTE — Telephone Encounter (Signed)
   Primary Cardiologist:Christopher Angelena Form, MD  Will route to Ermalinda Barrios, PA-C for appt on 04/26/18.  Surgical risk recommendations will be sent to the surgeon by Ms. Bonnell Public after this appt.  This note will be removed from the Preop Pool.   Richardson Dopp, PA-C  04/01/2018, 12:50 PM

## 2018-04-01 NOTE — Progress Notes (Signed)
[facility] Clinic Note - Initial Visit   Date: 04/01/18  [name] MRN: [id number] DOB: 04/15/1951   Dear Brandon Chestnut, NP:  Thank you for your kind referral of [name] for consultation of bilateral leg pain. Although his history is well known to you, please allow [name] to reiterate it for the purpose of our medical record. The patient was accompanied to the clinic by daughter who also provides collateral information.     History of Present Illness: [name] is a 67 y.o. right-handed Caucasian male with CAD s/p PCI, RCEA, GERD, hyperlipidemia, hypertension, bradycardia, OSA, and BPH presenting for evaluation of bilateral leg pain.    Starting around November 2018, he began having achy soreness of the calf.  Since this time, pain is constant present at rest and worse with exertion. He works full-time in [employer] and is on his feet 10-12 hours per day.  He is able to continue his work and does not take any breaks with prolonged standing/walking; however, notes that if he rests, it takes him several attempts to stand back up due to stiffness.  Sometimes, he has transient numbness over the tips of the toes. He denies low back pain. Around the same time, he noticed discoloration of the toes up to his ankles.  This resolved spontaneously within 1 day.  He has had an extensive workup for leg pain and discoloration by Dr. Scot Dock, vascular surgery, with normal arterial doppler studies, ABI's, and unremarkable CT angiogram.  He was started on gabapentin 31m at bedtime which provided relief for 1-2 days, so it was increased 3026mto twice daily which again worked for a few days.  NSAIDs also provide relief of leg pain.   Out-side paper records, electronic medical record, and images have been reviewed where available and summarized as: Lab Results  Component Value Date   TSH 1.75 03/04/2018   Lab Results  Component Value Date   [id number] 1035/17/2019       Past Medical History:  Diagnosis Date  . Bradycardia   . Carotid artery disease ([place]   Right CEA;  dopplers 6/12:  0-39% bilat ICA  . Coronary artery disease    s/p BMS to RCA 2002; [id number]: oLAD 40%, pRCA stent ok with 20%, EF 60%); Myoview scan in March 2011 which was negative for ischemia   . GERD (gastroesophageal reflux disease)   . Hyperlipidemia   . Hypertension   . Myocardial infarction ([facility]  . Obstructive sleep apnea     Past Surgical History:  Procedure Laterality Date  . CARDIAC CATHETERIZATION  06/01/2007    EF of 65% -- Nonobstructive CAD with 40% ostial stenosis in the LAD, no significant obstruction in the circumflex artery, 20% narrowing within the stent in the proximal to mid right coronary and normal LV function  . CARDIAC CATHETERIZATION  02/02/2006   EF of 50%  . CARDIAC CATHETERIZATION  01/19/2002   EF of 60%  . CARDIAC CATHETERIZATION N/A 12/03/2015   Procedure: Left Heart Cath and Coronary Angiography;  Surgeon: ChBurnell BlanksMD;  [facility];  Service: Cardiovascular;  Laterality: N/A;  . CAROTID ENDARTERECTOMY  06/26/2002   right  . CORONARY ANGIOPLASTY WITH STENT PLACEMENT  03/08/2000   Bare-metal stent in RCA[place]. KNEE ARTHROSCOPY     right  . MOLE REMOVAL     Prior moles removed     Medications:  Outpatient Encounter Medications  as of 04/01/2018  Medication Sig  . amLODipine (NORVASC) 10 MG tablet Take 1 tablet by mouth daily  . aspirin 81 MG tablet Take 81 mg by mouth daily.    Marland Kitchen atenolol (TENORMIN) 50 MG tablet Take 0.5 tablets (25 mg total) by mouth daily.  Marland Kitchen b complex vitamins capsule Take 1 capsule by mouth daily.  . clopidogrel (PLAVIX) 75 MG tablet Take 1 tablet (75 mg total) by mouth daily.  Marland Kitchen doxazosin (CARDURA) 2 MG tablet Take 1 tablet (2 mg total) by mouth daily.  Marland Kitchen ezetimibe (ZETIA) 10 MG tablet Take 1 tablet (10 mg total) by mouth daily. Annual appt due in July must see provider  for future refills  . finasteride (PROSCAR) 5 MG tablet   . gabapentin (NEURONTIN) 300 MG capsule Take 1 tablet in the morning and 2 tablets at bedtime  . hydrochlorothiazide (HYDRODIURIL) 50 MG tablet Take 0.5 tablets (25 mg total) by mouth daily.  . isosorbide mononitrate (IMDUR) 30 MG 24 hr tablet Take 1 tablet (30 mg total) by mouth daily.  . nitroGLYCERIN (NITROSTAT) 0.4 MG SL tablet Place 1 tablet (0.4 mg total) under the tongue every 5 (five) minutes as needed. Call 9-1-1 if need more than 2.  . pantoprazole (PROTONIX) 40 MG tablet Take 1 tablet by mouth every morning.  . potassium chloride (K-DUR,KLOR-CON) 10 MEQ tablet Take 1 tablet (10 mEq total) by mouth daily.  . ramipril (ALTACE) 10 MG capsule TAKE 1 CAPSULE TWICE A DAY  . rosuvastatin (CRESTOR) 40 MG tablet Take 1 tablet (40 mg total) by mouth daily.  . [DISCONTINUED] gabapentin (NEURONTIN) 300 MG capsule Take 1 capsule (300 mg total) by mouth 2 (two) times daily.   No facility-administered encounter medications on file as of 04/01/2018.      Allergies:  Allergies  Allergen Reactions  . Felodipine Swelling  . Niacin     REACTION: not tolerated.    Family History: Family History  Problem Relation Age of Onset  . Heart failure Father   . Heart disease Father   . Peripheral vascular disease Mother        with pacemaker, and had a carotid endarterectomy  . Heart disease Mother   . Hypertension Brother   . Hypertension Brother     Social History: Social History   Tobacco Use  . Smoking status: Former Smoker    Packs/day: 1.00    Years: 30.00    Pack years: 30.00    Types: Cigarettes    Last attempt to quit: 10/19/2000    Years since quitting: 17.4  . Smokeless tobacco: Current User    Types: Snuff  . Tobacco comment: Dips snuff.   Substance Use Topics  . Alcohol use: Yes    Comment: Occasional beer  . Drug use: No   Social History   Social History Narrative   Lives with wife in a one story home.  Has 3  children.  Works in Architect.  Education: high school.     Review of Systems:  CONSTITUTIONAL: No fevers, chills, night sweats, or weight loss.   EYES: No visual changes or eye pain ENT: No hearing changes.  No history of nose bleeds.   RESPIRATORY: No cough, wheezing and shortness of breath.   CARDIOVASCULAR: Negative for chest pain, and palpitations.   GI: Negative for abdominal discomfort, blood in stools or black stools.  No recent change in bowel habits.   GU:  No history of incontinence.   MUSCLOSKELETAL: No history  of joint pain or swelling.  +myalgias.   SKIN: +for lesions, rash, and itching.   HEMATOLOGY/ONCOLOGY: Negative for prolonged bleeding, bruising easily, and swollen nodes.  No history of cancer.   ENDOCRINE: Negative for cold or heat intolerance, polydipsia or goiter.   PSYCH:  No depression or anxiety symptoms.   NEURO: As Above.   Vital Signs:  BP 110/64   Pulse (!) 53   Ht '5\' 9"'  (1.753 m)   Wt 198 lb 4 oz (89.9 kg)   SpO2 98%   BMI 29.28 kg/m    General Medical Exam:   General:  Well appearing, comfortable.   Eyes/ENT: see cranial nerve examination.   Neck: No masses appreciated.  Full range of motion without tenderness.  No carotid bruits. Respiratory:  Clear to auscultation, good air entry bilaterally.   Cardiac:  Regular rate and rhythm, no murmur.   Extremities:  No deformities.  There is 1+ pitting edema of the lower legs and ankle region.  Skin:  Hyperpigmentation over the ankles bilaterally and near web spaces of the toes.   Neurological Exam: MENTAL STATUS including orientation to time, place, person, recent and remote memory, attention span and concentration, language, and fund of knowledge is normal.  Speech is not dysarthric.  CRANIAL NERVES: II:  No visual field defects.  Unremarkable fundi.   III-IV-VI: Pupils equal round and reactive to light.  Normal conjugate, extra-ocular eye movements in all directions of gaze.  No nystagmus.  No  ptosis.   V:  Normal facial sensation.   VII:  Normal facial symmetry and movements.  No pathologic facial reflexes.  VIII:  Normal hearing and vestibular function.   IX-X:  Normal palatal movement.   XI:  Normal shoulder shrug and head rotation.   XII:  Normal tongue strength and range of motion, no deviation or fasciculation.  MOTOR:  No atrophy, fasciculations or abnormal movements.  No pronator drift.  Tone is normal.    Right Upper Extremity:    Left Upper Extremity:    Deltoid  5/5   Deltoid  5/5   Biceps  5/5   Biceps  5/5   Triceps  5/5   Triceps  5/5   Wrist extensors  5/5   Wrist extensors  5/5   Wrist flexors  5/5   Wrist flexors  5/5   Finger extensors  5/5   Finger extensors  5/5   Finger flexors  5/5   Finger flexors  5/5   Dorsal interossei  5/5   Dorsal interossei  5/5   Abductor pollicis  5/5   Abductor pollicis  5/5   Tone (Ashworth scale)  0  Tone (Ashworth scale)  0   Right Lower Extremity:    Left Lower Extremity:    Hip flexors  5/5   Hip flexors  5/5   Hip extensors  5/5   Hip extensors  5/5   Knee flexors  5/5   Knee flexors  5/5   Knee extensors  5/5   Knee extensors  5/5   Dorsiflexors  5/5   Dorsiflexors  5/5   Plantarflexors  5/5   Plantarflexors  5/5   Toe extensors  5/5   Toe extensors  5/5   Toe flexors  5/5   Toe flexors  5/5   Tone (Ashworth scale)  0  Tone (Ashworth scale)  0   MSRs:  Right  Left brachioradialis 2+  brachioradialis 2+  biceps 2+  biceps 2+  triceps 2+  triceps 2+  patellar 2+  patellar 2+  ankle jerk 2+  ankle jerk 2+  Hoffman no  Hoffman no  plantar response down  plantar response down   SENSORY:  Absent vibration at the great toe bilaterally, reduced temperature over the dorsum of the feet.  Light touch and pin prick intact.  Romberg's sign absent.   COORDINATION/GAIT: Normal finger-to- nose-finger.  Intact rapid alternating movements bilaterally.  Gait  narrow based and stable. Tandem and stressed gait intact.    IMPRESSION: Bilateral leg pain, most likely musculoskeletal in nature given the characteristic "achy, soreness" that he describes. However, he does appreciate improvement of pain with gabapentin and he has some features of claudication, so will order MRI lumbar spine to evaluate for spinal stenosis.  For pain, take gabapentin 356m in the morning and increase to 6074mat bedtime.   Possible early and distal peripheral neuropathy affecting the toes. Check ESR, HbA1c, folate, copper, CK.  Consider NCS/EMG of the legs, pending MRI results.   I'm not sure what to make of his hyperpigmentation around the toes and dorsum of the feet and swelling, which are not features of primary nerve pathology.     Return to clinic in 4 months.   Thank you for allowing me to participate in patient's care.  If I can answer any additional questions, I would be pleased to do so.    Sincerely,    Donika K. PaPosey ProntoDO

## 2018-04-01 NOTE — Telephone Encounter (Signed)
OK to hold Plavix prior to the procedure.   [name]

## 2018-04-04 ENCOUNTER — Encounter: Payer: Self-pay | Admitting: Nurse Practitioner

## 2018-04-04 LAB — COPPER, SERUM: Copper: 90 ug/dL (ref 70–175)

## 2018-04-05 ENCOUNTER — Encounter: Payer: Self-pay | Admitting: *Deleted

## 2018-04-05 ENCOUNTER — Other Ambulatory Visit: Payer: Self-pay | Admitting: Nurse Practitioner

## 2018-04-05 DIAGNOSIS — E876 Hypokalemia: Secondary | ICD-10-CM

## 2018-04-05 MED ORDER — POTASSIUM CHLORIDE CRYS ER 10 MEQ PO TBCR: 10.0000 meq | EXTENDED_RELEASE_TABLET | Freq: Every day | ORAL | 3 refills | Status: DC

## 2018-04-05 NOTE — Telephone Encounter (Signed)
-----   Message from Alda Berthold, DO sent at 04/04/2018 10:44 AM EDT ----- Please inform patient that his labs are normal, except his HbA1C for diabetes is a borderline. I'll share the results with his PCP so they can follow.  Once we get the results of his MRI lumbar spine, we'll be in touch again. Thanks.

## 2018-04-11 ENCOUNTER — Other Ambulatory Visit: Payer: Self-pay | Admitting: Neurology

## 2018-04-11 DIAGNOSIS — R292 Abnormal reflex: Secondary | ICD-10-CM

## 2018-04-11 DIAGNOSIS — M79605 Pain in left leg: Principal | ICD-10-CM

## 2018-04-11 DIAGNOSIS — M79604 Pain in right leg: Secondary | ICD-10-CM

## 2018-04-11 DIAGNOSIS — Z77018 Contact with and (suspected) exposure to other hazardous metals: Secondary | ICD-10-CM

## 2018-04-14 ENCOUNTER — Other Ambulatory Visit: Payer: Self-pay | Admitting: Nurse Practitioner

## 2018-04-24 ENCOUNTER — Other Ambulatory Visit: Payer: Managed Care, Other (non HMO)

## 2018-04-26 ENCOUNTER — Ambulatory Visit: Payer: Managed Care, Other (non HMO) | Admitting: Physician Assistant

## 2018-04-26 ENCOUNTER — Encounter: Payer: Self-pay | Admitting: Physician Assistant

## 2018-04-26 VITALS — BP 122/70 | Ht 69.0 in | Wt 199.4 lb

## 2018-04-26 DIAGNOSIS — I779 Disorder of arteries and arterioles, unspecified: Secondary | ICD-10-CM | POA: Diagnosis not present

## 2018-04-26 DIAGNOSIS — R42 Dizziness and giddiness: Secondary | ICD-10-CM | POA: Diagnosis not present

## 2018-04-26 DIAGNOSIS — I10 Essential (primary) hypertension: Secondary | ICD-10-CM

## 2018-04-26 DIAGNOSIS — I2510 Atherosclerotic heart disease of native coronary artery without angina pectoris: Secondary | ICD-10-CM

## 2018-04-26 DIAGNOSIS — E785 Hyperlipidemia, unspecified: Secondary | ICD-10-CM | POA: Diagnosis not present

## 2018-04-26 DIAGNOSIS — I739 Peripheral vascular disease, unspecified: Secondary | ICD-10-CM

## 2018-04-26 DIAGNOSIS — R001 Bradycardia, unspecified: Secondary | ICD-10-CM | POA: Diagnosis not present

## 2018-04-26 DIAGNOSIS — Z01818 Encounter for other preprocedural examination: Secondary | ICD-10-CM | POA: Diagnosis not present

## 2018-04-26 HISTORY — DX: Bradycardia, unspecified: R00.1

## 2018-04-26 HISTORY — DX: Dizziness and giddiness: R42

## 2018-04-26 HISTORY — DX: Encounter for other preprocedural examination: Z01.818

## 2018-04-26 NOTE — Patient Instructions (Addendum)
Medication Instructions:   .MAKE SURE YOU TAKING ASPIRIN  AND PLAVIX WITH FOOD   STOP TAKING ATENOLOL   If you need a refill on your cardiac medications before your next appointment, please call your pharmacy.   Labwork: NONE ORDERED  TODAY     Testing/Procedures:  IN 2 WEEKS Your physician has recommended that you wear a holter monitor. Holter monitors are medical devices that record the heart's electrical activity. Doctors most often use these monitors to diagnose arrhythmias. Arrhythmias are problems with the speed or rhythm of the heartbeat. The monitor is a small, portable device. You can wear one while you do your normal daily activities. This is usually used to diagnose what is causing palpitations/syncope (passing out).  Your physician has requested that you have a carotid duplex. This test is an ultrasound of the carotid arteries in your neck. It looks at blood flow through these arteries that supply the brain with blood. Allow one hour for this exam. There are no restrictions or special instructions.    Follow-Up: Your physician wants you to follow-up in: [place] will receive a reminder letter in the mail two months in advance. If you don't receive a letter, please call our office to schedule the follow-up appointment.     Any Other Special Instructions Will Be Listed Below (If Applicable).  STOP CHEWING TOBACCO

## 2018-04-26 NOTE — Progress Notes (Signed)
Please make sure this patient has instructions about holding his plavix before egd/colon.  This is a clinic note from cardiology.

## 2018-04-26 NOTE — Progress Notes (Signed)
Cardiology Office Note    Date:  04/26/2018   ID:  [name], [name] Jan 17, 1951, MRN [id number]  PCP:  Lance Sell, NP  Cardiologist: Lauree Chandler, MD  Chief Complaint  Patient presents with  . Pre-op Exam    History of Present Illness:  [name] is a 67 y.o. male history of CAD status post BMS to the RCA in 2002, last cath in 2017 patent RCA stent with diffuse nonobstructive CAD treated medically, hypertension, HLD, carotid artery disease status post right CEA in 2003, OSA on CPAP.  Patient last seen in our office by Nell Range, PA-C in 01/2016 and was doing well.  He is followed by Dr. Scot Dock for PVD and claudication symptoms.  CT angiogram showed no significant occlusive disease 09/2017 there was an incidental small benign left adrenal adenoma.  Patient added on to my schedule for surgical clearance for EGD and colonoscopy.  Dr. Angelena Form already said he could hold his Plavix.  Patient comes in today accompanied by his wife.  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  He does not exercise outside of this.  He denies any chest pain, palpitations, dyspnea, dyspnea on exertion.  He does have some dizziness when he gets up in the morning.  His heart rate is in the 40s on atenolol.  He also has leg swelling when he is on his feet all day but goes down at night.  Past Medical History:  Diagnosis Date  . Bradycardia   . Carotid artery disease ([place])    Right CEA;  dopplers 6/12:  0-39% bilat ICA  . Coronary artery disease    s/p BMS to RCA 2002; [place] 2008: oLAD 40%, pRCA stent ok with 20%, EF 60%); Myoview scan in March 2011 which was negative for ischemia   . GERD (gastroesophageal reflux disease)   . Hyperlipidemia   . Hypertension   . Myocardial infarction ([place])   . Obstructive sleep apnea     Past Surgical History:  Procedure Laterality Date  . CARDIAC CATHETERIZATION  06/01/2007    EF of 65% -- Nonobstructive CAD with 40% ostial stenosis in  the LAD, no significant obstruction in the circumflex artery, 20% narrowing within the stent in the proximal to mid right coronary and normal LV function  . CARDIAC CATHETERIZATION  02/02/2006   EF of 50%  . CARDIAC CATHETERIZATION  01/19/2002   EF of 60%  . CARDIAC CATHETERIZATION N/A 12/03/2015   Procedure: Left Heart Cath and Coronary Angiography;  Surgeon: Burnell Blanks, MD;  Location: [facility];  Service: Cardiovascular;  Laterality: N/A;  . CAROTID ENDARTERECTOMY  06/26/2002   right  . CORONARY ANGIOPLASTY WITH STENT PLACEMENT  03/08/2000   Bare-metal stent in RCA, in [place]  . KNEE ARTHROSCOPY     right  . MOLE REMOVAL     Prior moles removed    Current Medications: Current Meds  Medication Sig  . amLODipine (NORVASC) 10 MG tablet Take 1 tablet by mouth daily  . aspirin 81 MG tablet Take 81 mg by mouth daily.    Marland Kitchen b complex vitamins capsule Take 1 capsule by mouth daily.  . clopidogrel (PLAVIX) 75 MG tablet Take 1 tablet (75 mg total) by mouth daily.  Marland Kitchen doxazosin (CARDURA) 2 MG tablet Take 1 tablet (2 mg total) by mouth daily.  Marland Kitchen ezetimibe (ZETIA) 10 MG tablet Take 1 tablet (10 mg total) by mouth daily. Annual appt due in July  must see provider for future refills  . finasteride (PROSCAR) 5 MG tablet   . gabapentin (NEURONTIN) 300 MG capsule Take 1 tablet in the morning and 2 tablets at bedtime  . hydrochlorothiazide (HYDRODIURIL) 50 MG tablet Take 0.5 tablets (25 mg total) by mouth daily.  . isosorbide mononitrate (IMDUR) 30 MG 24 hr tablet Take 1 tablet (30 mg total) by mouth daily.  . nitroGLYCERIN (NITROSTAT) 0.4 MG SL tablet Place 1 tablet (0.4 mg total) under the tongue every 5 (five) minutes as needed. Call 9-1-1 if need more than 2.  . pantoprazole (PROTONIX) 40 MG tablet Take 1 tablet by mouth every morning.  . potassium chloride (K-DUR,KLOR-CON) 10 MEQ tablet TAKE 1 TABLET DAILY  . ramipril (ALTACE) 10 MG capsule TAKE 1 CAPSULE TWICE A DAY  .  rosuvastatin (CRESTOR) 40 MG tablet Take 1 tablet (40 mg total) by mouth daily.  . [DISCONTINUED] atenolol (TENORMIN) 50 MG tablet Take 0.5 tablets (25 mg total) by mouth daily.     Allergies:   Felodipine and Niacin   Social History   Socioeconomic History  . Marital status: Married    Spouse name: Not on file  . Number of children: 3  . Years of education: 7  . Highest education level: High school graduate  Occupational History  . Occupation: Barista  . Financial resource strain: Not on file  . Food insecurity:    Worry: Not on file    Inability: Not on file  . Transportation needs:    Medical: Not on file    Non-medical: Not on file  Tobacco Use  . Smoking status: Former Smoker    Packs/day: 1.00    Years: 30.00    Pack years: 30.00    Types: Cigarettes    Last attempt to quit: 10/19/2000    Years since quitting: 17.5  . Smokeless tobacco: Current User    Types: Snuff  . Tobacco comment: Dips snuff.   Substance and Sexual Activity  . Alcohol use: Yes    Comment: Occasional beer  . Drug use: No  . Sexual activity: Not on file  Lifestyle  . Physical activity:    Days per week: Not on file    Minutes per session: Not on file  . Stress: Not on file  Relationships  . Social connections:    Talks on phone: Not on file    Gets together: Not on file    Attends religious service: Not on file    Active member of club or organization: Not on file    Attends meetings of clubs or organizations: Not on file    Relationship status: Not on file  Other Topics Concern  . Not on file  Social History Narrative   Lives with wife in a one story home.  Has 3 children.  Works in Architect.  Education: high school.      Family History:  The patient's family history includes Heart disease in his father and mother; Heart failure in his father; Hypertension in his brother and brother; Peripheral vascular disease in his mother.   ROS:   Please see the history  of present illness.    Review of Systems  Constitution: Negative.  HENT: Negative.   Cardiovascular: Positive for leg swelling.  Respiratory: Negative.   Endocrine: Negative.   Hematologic/Lymphatic: Negative.   Musculoskeletal: Negative.   Gastrointestinal: Negative.   Genitourinary: Negative.   Neurological: Positive for dizziness.   All other systems reviewed and  are negative.   PHYSICAL EXAM:   VS:  BP 122/70 (BP Location: Left Arm, Patient Position: Sitting, Cuff Size: Normal)   Ht 5\' 9"  (1.753 m)   Wt 199 lb 6.4 oz (90.4 kg)   BMI 29.45 kg/m   Physical Exam  GEN: Well nourished, well developed, in no acute distress  Neck: no JVD, carotid bruits, or masses Cardiac:RRR; no murmurs, rubs, or gallops  Respiratory:  clear to auscultation bilaterally, normal work of breathing GI: soft, nontender, nondistended, + BS Ext: without cyanosis, clubbing, or edema, Good distal pulses bilaterally Neuro:  Alert and Oriented x 3 Psych: euthymic mood, full affect  Wt Readings from Last 3 Encounters:  04/26/18 199 lb 6.4 oz (90.4 kg)  04/01/18 198 lb 4 oz (89.9 kg)  03/29/18 199 lb (90.3 kg)      Studies/Labs Reviewed:   EKG:  EKG is ordered today.  The ekg ordered today demonstrates sinus bradycardia at 44 bpm much slower than last EKG which was 57 bpm  Recent Labs: 11/22/2017: BUN 19; Creatinine, Ser 1.33; Potassium 3.5; Sodium 139 03/04/2018: Hemoglobin 13.9; Magnesium 2.3; Platelets 153.0; TSH 1.75   Lipid Panel    Component Value Date/Time   CHOL 117 04/22/2017 1527   TRIG 75.0 04/22/2017 1527   HDL 37.40 (L) 04/22/2017 1527   CHOLHDL 3 04/22/2017 1527   VLDL 15.0 04/22/2017 1527   LDLCALC 64 04/22/2017 1527    Additional studies/ records that were reviewed today include:    LHC 12/03/15    Conclusion        Prox RCA to Mid RCA lesion, 30% stenosed. The lesion was previously treated with a bare metal stent greater than two years ago.  Prox RCA lesion, 30%  stenosed.  Mid RCA lesion, 30% stenosed.  Dist RCA lesion, 40% stenosed.  Mid Cx lesion, 30% stenosed.  1st Mrg lesion, 30% stenosed.  Prox LAD to Mid LAD lesion, 20% stenosed.  Ost LAD lesion, 30% stenosed.  1st Diag lesion, 20% stenosed.  Dist LAD lesion, 20% stenosed.  The left ventricular systolic function is normal.   Recommendations: Continue medical management of CAD. No further ischemic workup.            Carotid Dopplers 09/2016 Stable carotid artery disease. Essential stabe 1-39% bilateral ICA stenosis, s/p right CEA with DPA. Normal subclavian arteries, bilaterally. Patent vertebral arteries with antegrade flow.  Lower extremity ABIs 08/18/2017  final Interpretation: Right: Resting right ankle-brachial index is within normal range. No evidence of significant right lower extremity arterial disease. The right toe-brachial index is normal. Left: Resting left ankle-brachial index is within normal range. No evidence of significant left lower extremity arterial disease. The left toe-brachial index is normal.   *See table(s) above for measurements and observations.     ASSESSMENT:    1. Preoperative clearance   2. Atherosclerosis of native coronary artery of native heart without angina pectoris   3. Essential hypertension   4. Right-sided carotid artery disease, unspecified type ([place])   5. Hyperlipidemia, unspecified hyperlipidemia type   6. Bradycardia   7. Dizziness      PLAN:  In order of problems listed above:  Preoperative clearance before undergoing EGD/colonoscopy.  Dr. Angelena Form already cleared him to hold his Plavix for the procedure.  According to the revised cardiac risk index patient does not need further cardiac work-up before undergoing colonoscopy or endoscopy.  He has a high functional capacity.  He is bradycardic in the office today and I am  stopping his atenolol.  We will check a Holter monitor to reassess this off beta-blocker. According  to the Revised Cardiac Risk Index (RCRI), his Perioperative Risk of Major Cardiac Event is (%): 0.9  His Functional Capacity in METs is: 8.97 according to the Duke Activity Status Index (DASI).    CAD status post BMS to the RCA in 2002, last cath in 2017 patent RCA stents with diffuse nonobstructive CAD treated medically without angina.  Continue aspirin and Plavix.  He is not taking this with food which could be contributing to his indigestion.  Reiterated that he needs to take this with food.  Essential hypertension blood pressure well controlled  Carotid artery disease status post right carotid endarterectomy last Dopplers 09/2016 1 to 39% bilateral ICA stenosis-recheck carotid Dopplers  Hyperlipidemia on Zetia.  Managed by PCP we do not have his most recent labs.  Bradycardia and dizziness on low-dose atenolol.  Will stop atenolol in order 48-hour monitor in 2 weeks to reassess what his heart rate is.      Medication Adjustments/Labs and Tests Ordered: Current medicines are reviewed at length with the patient today.  Concerns regarding medicines are outlined above.  Medication changes, Labs and Tests ordered today are listed in the Patient Instructions below. Patient Instructions  Medication Instructions:   .MAKE SURE YOU TAKING ASPIRIN  AND PLAVIX WITH FOOD   STOP TAKING ATENOLOL   If you need a refill on your cardiac medications before your next appointment, please call your pharmacy.   Labwork: NONE ORDERED  TODAY     Testing/Procedures:  IN 2 WEEKS Your physician has recommended that you wear a holter monitor. Holter monitors are medical devices that record the heart's electrical activity. Doctors most often use these monitors to diagnose arrhythmias. Arrhythmias are problems with the speed or rhythm of the heartbeat. The monitor is a small, portable device. You can wear one while you do your normal daily activities. This is usually used to diagnose what is causing  palpitations/syncope (passing out).  Your physician has requested that you have a carotid duplex. This test is an ultrasound of the carotid arteries in your neck. It looks at blood flow through these arteries that supply the brain with blood. Allow one hour for this exam. There are no restrictions or special instructions.    Follow-Up: Your physician wants you to follow-up in: [place] will receive a reminder letter in the mail two months in advance. If you don't receive a letter, please call our office to schedule the follow-up appointment.     Any Other Special Instructions Will Be Listed Below (If Applicable).  STOP CHEWING TOBACCO  Sumner Boast, PA-C  04/26/2018 11:50 AM    [facility] [place], [place], [place]  [address] Phone: [phone]; Fax: [phone]

## 2018-04-27 ENCOUNTER — Telehealth: Payer: Self-pay | Admitting: *Deleted

## 2018-04-27 NOTE — Telephone Encounter (Signed)
Called patient to advise per Dr. Julianne Handler, he can hold the Plavix from 7-20 through and including 7-25. Dr Loletha Carrow will tell him when to resume the Plavix.  The patient asked me the name of his blood thinner again and I spelled it for him. He verbalized understanding the instructions.

## 2018-04-27 NOTE — Progress Notes (Signed)
ok 

## 2018-04-28 ENCOUNTER — Ambulatory Visit: Payer: Managed Care, Other (non HMO) | Admitting: Nurse Practitioner

## 2018-04-28 ENCOUNTER — Ambulatory Visit: Payer: Self-pay

## 2018-04-28 ENCOUNTER — Other Ambulatory Visit: Payer: Self-pay | Admitting: Physician Assistant

## 2018-04-28 ENCOUNTER — Encounter: Payer: Self-pay | Admitting: Nurse Practitioner

## 2018-04-28 ENCOUNTER — Other Ambulatory Visit (INDEPENDENT_AMBULATORY_CARE_PROVIDER_SITE_OTHER): Payer: Managed Care, Other (non HMO)

## 2018-04-28 ENCOUNTER — Telehealth: Payer: Self-pay | Admitting: Cardiovascular Disease

## 2018-04-28 VITALS — BP 122/64 | HR 68 | Temp 97.6°F | Resp 16 | Ht 69.0 in | Wt 197.0 lb

## 2018-04-28 DIAGNOSIS — R5383 Other fatigue: Secondary | ICD-10-CM

## 2018-04-28 DIAGNOSIS — I6523 Occlusion and stenosis of bilateral carotid arteries: Secondary | ICD-10-CM

## 2018-04-28 LAB — COMPREHENSIVE METABOLIC PANEL
ALT: 36 U/L (ref 0–53)
AST: 24 U/L (ref 0–37)
Albumin: 4.4 g/dL (ref 3.5–5.2)
Alkaline Phosphatase: 50 U/L (ref 39–117)
BUN: 13 mg/dL (ref 6–23)
CO2: 30 mEq/L (ref 19–32)
Calcium: 9.4 mg/dL (ref 8.4–10.5)
Chloride: 105 mEq/L (ref 96–112)
Creatinine, Ser: 1.13 mg/dL (ref 0.40–1.50)
GFR: 69.12 mL/min (ref >60.00)
Glucose, Bld: 178 mg/dL — ABNORMAL HIGH (ref 70–99)
Potassium: 3.8 mEq/L (ref 3.5–5.1)
Sodium: 141 mEq/L (ref 135–145)
Total Bilirubin: 1.1 mg/dL (ref 0.2–1.2)
Total Protein: 7.1 g/dL (ref 6.0–8.3)

## 2018-04-28 LAB — CBC
HCT: 40.5 % (ref 39.0–52.0)
Hemoglobin: 14.1 g/dL (ref 13.0–17.0)
MCHC: 34.9 g/dL (ref 30.0–36.0)
MCV: 87.6 fl (ref 78.0–100.0)
Platelets: 154 10*3/uL (ref 150.0–400.0)
RBC: 4.62 Mil/uL (ref 4.22–5.81)
RDW: 12.7 % (ref 11.5–15.5)
WBC: 4.3 10*3/uL (ref 4.0–10.5)

## 2018-04-28 NOTE — Telephone Encounter (Signed)
Patient called in with c/o "dizziness and tired feeling." He says "I went to the cardiologist Tuesday and was taken off atenolol because my heart rate was in the 40's and I was dizzy when I got up. I'm still having the dizziness, my BP 121/70, HR 51 this morning. I'm dizzy when I stand up and move around, tired feeling, but when I sit down, it all gets better." I asked about other symptoms, he denies. According to protocol, see PCP within 24 hours, appointment scheduled today at 1530 with [name], NP, care advice given, patient verbalized understanding.   Reason for Disposition . [1] MODERATE dizziness (e.g., interferes with normal activities) AND [2] has NOT been evaluated by physician for this  (Exception: dizziness caused by heat exposure, sudden standing, or poor fluid intake)  Answer Assessment - Initial Assessment Questions 1. DESCRIPTION: "Describe your dizziness."     Tired, lightheaded 2. LIGHTHEADED: "Do you feel lightheaded?" (e.g., somewhat faint, woozy, weak upon standing)     Yes 3. VERTIGO: "Do you feel like either you or the room is spinning or tilting?" (i.e. vertigo)     No 4. SEVERITY: "How bad is it?"  "Do you feel like you are going to faint?" "Can you stand and walk?"   - MILD - walking normally   - MODERATE - interferes with normal activities (e.g., work, school)    - SEVERE - unable to stand, requires support to walk, feels like passing out now.      Moderate 5. ONSET:  "When did the dizziness begin?"     2 days 6. AGGRAVATING FACTORS: "Does anything make it worse?" (e.g., standing, change in head position)     Standing, walking 7. HEART RATE: "Can you tell me your heart rate?" "How many beats in 15 seconds?"  (Note: not all patients can do this)       51 this morning; BP 121/70 8. CAUSE: "What do you think is causing the dizziness?"     I don't know 9. RECURRENT SYMPTOM: "Have you had dizziness before?" If so, ask: "When was the last time?" "What happened  that time?"     Yes, not as bad about 2-3 weeks ago; didn't see anyone 10. OTHER SYMPTOMS: "Do you have any other symptoms?" (e.g., fever, chest pain, vomiting, diarrhea, bleeding)       No, just tired 11. PREGNANCY: "Is there any chance you are pregnant?" "When was your last menstrual period?"       N/A  Protocols used: DIZZINESS [facility]

## 2018-04-28 NOTE — Telephone Encounter (Signed)
I spoke with pt's daughter who is a Marine scientist.  She reports atenolol was stopped at office visit on 04/26/18 because of low heart rate. Daughter states pt complained of lightheadedness and not feeling good yesterday. BP last night was 130/84 and heart rate was 60. Today his BP is 120's and heart rate 60.  Daughter reports pt complains of feeling weak and lightheaded today and is coming home from work. Dizziness improves when sitting down. No chest pain. No shortness of breath.  Office note indicates pt has been having dizziness but daughter reports pt did not tell her about dizziness until recently. She is asking if pt should be seen today. I advised daughter to contact primary care today to see if he could be seen as there could be several reasons for his dizziness and weak feeling.

## 2018-04-28 NOTE — Telephone Encounter (Signed)
New Message:        Pt c/o medication issue:  1. Name of Medication: Atenolol  2. How are you currently taking this medication (dosage and times per day)? 25 mg oral  3. Are you having a reaction (difficulty breathing--STAT)? No  4. What is your medication issue? Pt's daughter states the pt is having some lightheadedness since he has been taking off this medication. Pt would like to be seen today.

## 2018-04-28 NOTE — Patient Instructions (Signed)
Please head downstairs for lab work.  Please make sure that you are staying hydrated outside, not just water but electrolyte drinks as well.  Continue with cardiology follow up as instructed  I will contact you with lab results and further plan.   Fatigue Fatigue is feeling tired all of the time, a lack of energy, or a lack of motivation. Occasional or mild fatigue is often a normal response to activity or life in general. However, long-lasting (chronic) or extreme fatigue may indicate an underlying medical condition. Follow these instructions at home: Watch your fatigue for any changes. The following actions may help to lessen any discomfort you are feeling:  Talk to your health care provider about how much sleep you need each night. Try to get the required amount every night.  Take medicines only as directed by your health care provider.  Eat a healthy and nutritious diet. Ask your health care provider if you need help changing your diet.  Drink enough fluid to keep your urine clear or pale yellow.  Practice ways of relaxing, such as yoga, meditation, massage therapy, or acupuncture.  Exercise regularly.  Change situations that cause you stress. Try to keep your work and personal routine reasonable.  Do not abuse illegal drugs.  Limit alcohol intake to no more than 1 drink per day for nonpregnant women and 2 drinks per day for men. One drink equals 12 ounces of beer, 5 ounces of wine, or 1 ounces of hard liquor.  Take a multivitamin, if directed by your health care provider.  Contact a health care provider if:  Your fatigue does not get better.  You have a fever.  You have unintentional weight loss or gain.  You have headaches.  You have difficulty: ? Falling asleep. ? Sleeping throughout the night.  You feel angry, guilty, anxious, or sad.  You are unable to have a bowel movement (constipation).  You skin is dry.  Your legs or another part of your body is  swollen. Get help right away if:  You feel confused.  Your vision is blurry.  You feel faint or pass out.  You have a severe headache.  You have severe abdominal, pelvic, or back pain.  You have chest pain, shortness of breath, or an irregular or fast heartbeat.  You are unable to urinate or you urinate less than normal.  You develop abnormal bleeding, such as bleeding from the rectum, vagina, nose, lungs, or nipples.  You vomit blood.  You have thoughts about harming yourself or committing suicide.  You are worried that you might harm someone else. This information is not intended to replace advice given to you by your health care provider. Make sure you discuss any questions you have with your health care provider. Document Released: 08/02/2007 Document Revised: 03/12/2016 Document Reviewed: 02/06/2014 Elsevier Interactive Patient Education  [employer].

## 2018-04-28 NOTE — Progress Notes (Signed)
Name: [name]   MRN: [id number]    DOB: [birth year]   Date:04/28/2018       Progress Note  Subjective  Chief Complaint  Chief Complaint  Patient presents with  . Dizziness    dizziness has been going on for a couple of weeks, tired and weak,     HPI  [name] is here today for evaluation of weakness, fatigue and dizziness. He reports he's felt these symptoms for about 3 days now, and had to leave work today because he felt so weak. 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  He denies syncope, headache, confusion, vision changes, slurred speech chest pain, shortness of breath, cough, nausea, vomiting, dysuria, hematuria, diarrhea, rectal bleeding. He was seen by cardiology on 7/9 for surgical pre-op for upcoming colonoscopy, endoscopy and his atenolol was stopped due to bradycardia and dizziness, he says he did stop the atenolol but has not noticed any change in his symptoms.  He is scheduled for upcoming carotid dopplers nad heart monitor by cardiology.  Patient Active Problem List   Diagnosis Date Noted  . Preoperative clearance 04/26/2018  . Bradycardia 04/26/2018  . Dizziness 04/26/2018  . Hypersomnia with sleep apnea 12/05/2015  . Unstable angina ([place]) 12/02/2015  . Plantar fasciitis, bilateral 12/13/2012  . Carotid artery disease ([place]) 11/24/2010  . BRADYCARDIA 04/12/2009  . Coronary atherosclerosis 12/25/2008  . Obstructive sleep apnea 02/03/2008  . MYOCARDIAL INFARCTION 02/03/2008  . Hyperlipidemia 10/08/2007  . Essential hypertension 10/08/2007  . GASTROESOPHAGEAL REFLUX DISEASE 10/08/2007    Social History   Tobacco Use  . Smoking status: Former Smoker    Packs/day: 1.00    Years: 30.00    Pack years: 30.00    Types: Cigarettes    Last attempt to quit: 10/19/2000    Years since quitting: 17.5  . Smokeless tobacco: Current User    Types: Snuff  . Tobacco comment: Dips snuff.   Substance Use Topics  . Alcohol use: Yes    Comment:  Occasional beer     Current Outpatient Medications:  .  amLODipine (NORVASC) 10 MG tablet, Take 1 tablet by mouth daily, Disp: 90 tablet, Rfl: 1 .  aspirin 81 MG tablet, Take 81 mg by mouth daily.  , Disp: , Rfl:  .  b complex vitamins capsule, Take 1 capsule by mouth daily., Disp: , Rfl:  .  doxazosin (CARDURA) 2 MG tablet, Take 1 tablet (2 mg total) by mouth daily., Disp: 180 tablet, Rfl: 1 .  ezetimibe (ZETIA) 10 MG tablet, Take 1 tablet (10 mg total) by mouth daily. Annual appt due in July must see provider for future refills, Disp: 90 tablet, Rfl: 1 .  finasteride (PROSCAR) 5 MG tablet, , Disp: , Rfl:  .  gabapentin (NEURONTIN) 300 MG capsule, Take 1 tablet in the morning and 2 tablets at bedtime, Disp: 90 capsule, Rfl: 11 .  hydrochlorothiazide (HYDRODIURIL) 50 MG tablet, Take 0.5 tablets (25 mg total) by mouth daily., Disp: 45 tablet, Rfl: 1 .  isosorbide mononitrate (IMDUR) 30 MG 24 hr tablet, Take 1 tablet (30 mg total) by mouth daily., Disp: 90 tablet, Rfl: 1 .  nitroGLYCERIN (NITROSTAT) 0.4 MG SL tablet, Place 1 tablet (0.4 mg total) under the tongue every 5 (five) minutes as needed. Call 9-1-1 if need more than 2., Disp: 25 tablet, Rfl: 0 .  pantoprazole (PROTONIX) 40 MG tablet, Take 1 tablet by mouth every morning., Disp: 90 tablet, Rfl: 3 .  potassium chloride (K-DUR,KLOR-CON) 10 MEQ tablet, TAKE 1 TABLET DAILY, Disp: 90 tablet, Rfl: 0 .  ramipril (ALTACE) 10 MG capsule, TAKE 1 CAPSULE TWICE A DAY, Disp: 180 capsule, Rfl: 1 .  rosuvastatin (CRESTOR) 40 MG tablet, Take 1 tablet (40 mg total) by mouth daily., Disp: 90 tablet, Rfl: 1  Allergies  Allergen Reactions  . Felodipine Swelling  . Niacin     REACTION: not tolerated.    ROS  No other specific complaints in a complete review of systems (except as listed in HPI above).  Objective  Vitals:   04/28/18 1529  BP: 122/64  Pulse: 68  Resp: 16  Temp: 97.6 F (36.4 C)  TempSrc: Oral  SpO2: 97%  Weight: 197 lb (89.4  kg)  Height: 5\' 9"  (1.753 m)    Body mass index is 29.09 kg/m.  Nursing Note and Vital Signs reviewed.  Physical Exam Vital signs reviewed. Constitutional: He isoriented to person, place, and time. He appearswell-developedand well-nourished.No distress.  HENT:  Head:Normocephalicand atraumatic.  Neck:Normal range of motion.Neck supple.  Cardiovascular:Normal rate,regular rhythm,normal heart soundsand intact distal pulses. Pulmonary/Chest: Effort normal and breath sounds normal. No respiratory distress. Musculoskeletal:Normal range of motion. No gross deformities Neurological:He is alert and oriented to person, place, and time. Coordination, balance, strength, speech and gait are normal. Skin: Skin iswarmand dry.No rashnoted. No erythema.  Psychiatric: He has anormal mood and affect.Judgmentand thought contentnormal.   Assessment & Plan  1. Other fatigue Recent labs reviewed, will update CBC and CMET today F/U with further recommendations pending lab results Continue follow up with cardiology for further evaluation of bradycardia and dizziness Home management of fatigue, Red flags and when to present for emergency care or RTC including fever >101.29F, chest pain, shortness of breath, new/worsening/un-resolving symptoms, reviewed with patient and provided in AVS. Per last visit with me, will also add Vitamin D level to labs today - CBC; Future - Comprehensive metabolic panel; Future

## 2018-04-28 NOTE — Telephone Encounter (Signed)
Agree. Thanks

## 2018-04-29 ENCOUNTER — Other Ambulatory Visit: Payer: Managed Care, Other (non HMO)

## 2018-05-02 ENCOUNTER — Telehealth: Payer: Self-pay | Admitting: Physician Assistant

## 2018-05-02 ENCOUNTER — Encounter: Payer: Self-pay | Admitting: Cardiovascular Disease

## 2018-05-02 NOTE — Telephone Encounter (Signed)
Called patient's daughter [place], [place] back. Patient's daughter stated patient has been fatigue, SOB with activity, and having weakness since atenolol has been discontinued. Patient's BP today BP 120/80 and HR 50. Patient's daughter stated patient's energy level has greatly declined since stopping Atenolol. Patient's daughter is wanting patient to be seen today. Informed patient's daughter the first available appointment is tomorrow. Scheduled patient with Ellen Henri PA for tomorrow. Encouraged her to take patient to ED if symptoms get worse. Patient's daughter verbalized understanding. Will forward to Dr. Angelena Form for further advisement.

## 2018-05-02 NOTE — Telephone Encounter (Signed)
New Message    Pt c/o medication issue:  1. Name of Medication: Atenolol   2. How are you currently taking this medication (dosage and times per day)? 2 x a day/50mg   3. Are you having a reaction (difficulty breathing--STAT)? No   4. What is your medication issue? Medication making patient tired. Patient is unable to move around or do anything. Patient just sat in recliner all weekend,

## 2018-05-03 ENCOUNTER — Ambulatory Visit (INDEPENDENT_AMBULATORY_CARE_PROVIDER_SITE_OTHER): Payer: Managed Care, Other (non HMO)

## 2018-05-03 ENCOUNTER — Ambulatory Visit: Payer: Managed Care, Other (non HMO) | Admitting: Cardiology

## 2018-05-03 ENCOUNTER — Encounter: Payer: Self-pay | Admitting: Cardiology

## 2018-05-03 ENCOUNTER — Encounter: Payer: Self-pay | Admitting: *Deleted

## 2018-05-03 DIAGNOSIS — R0789 Other chest pain: Secondary | ICD-10-CM | POA: Diagnosis not present

## 2018-05-03 DIAGNOSIS — R001 Bradycardia, unspecified: Secondary | ICD-10-CM

## 2018-05-03 NOTE — Progress Notes (Signed)
05/03/2018 [name]   1951-03-23  [id number]  Primary Physician Lance Sell, NP Primary Cardiologist: Dr. Angelena Form   Reason for Visit/CC: Fatigue, Chest and Shoulder Pain   HPI:  [name] is a 67 y.o. male who is being seen today for the evaluation of fatigue, chest and left shoulder pain.   He has a history of CAD, HTN, hyperlipidemia, carotid artery disease s/p CEA. He has been followed in the past by Dr. Olevia Perches. Now followed by Dr. Angelena Form. In 2002, he had a bare metal stent placed to the right coronary artery. A catheterization in 2008 showed non-obstructive disease. Had a Myoview scan in March 2011 which was negative for ischemia. He had duplex studies of his carotid in June 2012 with mild disease and stable right CEA site. He had a repeat cardiac cath 11/2015 which showed patent RCA stent and mild nonobstructive CAD (30-40% diffuse disease). Medical therapy recommended.   He was recently seen by Estella Husk, PA-C, for preop evaluation prior to an EGD/colonoscopy ordered by PCP given frequent "indigestion". He denied cardiac symptoms and was cleared for procedure. Dr. Angelena Form had agreed that he could hold Plavix. At that appt, he was noted to be bradycardic w/ HR in the 40s. Thus he was ordered to stop atenolol and to get a 48 hr holter monitor to assess HR, once off of BB. However pt has not yet gotten monitor. This is scheduled for 7/26.   Since stopping his atenolol, he has been markedly fatigue. No energy. Hasn't been able to go to work. He has also had intermittent left sided chest and shoulder pain, lasting ~20 min at a time. Feels like pressure. Occurs randomly w/ no particular pattern. Sometimes at rest and with exertion, but not always with activity. Not worsened or triggered by meals. He also notes syncope ~ 1 month ago but no recurrence. Constantly feels dizzy. Also of note, there is no anemia. Recent CBC showed normal Hgb at 14.1. TSH also WNL. His PCP ordered Vit  D lab, not yet completed.   He is currently CP free. EKG off of atenolol shows NSR 73 bpm. BP is controlled.    Cardiac Studies  Left Heart Cath and Coronary Angiography 11/2015  Conclusion    Prox RCA to Mid RCA lesion, 30% stenosed. The lesion was previously treated with a bare metal stent greater than two years ago.  Prox RCA lesion, 30% stenosed.  Mid RCA lesion, 30% stenosed.  Dist RCA lesion, 40% stenosed.  Mid Cx lesion, 30% stenosed.  1st Mrg lesion, 30% stenosed.  Prox LAD to Mid LAD lesion, 20% stenosed.  Ost LAD lesion, 30% stenosed.  1st Diag lesion, 20% stenosed.  Dist LAD lesion, 20% stenosed.  The left ventricular systolic function is normal.   Recommendations: Continue medical management of CAD. No further ischemic workup.       Current Meds  Medication Sig  . amLODipine (NORVASC) 10 MG tablet Take 1 tablet by mouth daily  . aspirin 81 MG tablet Take 81 mg by mouth daily.    Marland Kitchen b complex vitamins capsule Take 1 capsule by mouth daily.  Marland Kitchen doxazosin (CARDURA) 2 MG tablet Take 1 tablet (2 mg total) by mouth daily.  Marland Kitchen ezetimibe (ZETIA) 10 MG tablet Take 1 tablet (10 mg total) by mouth daily. Annual appt due in July must see provider for future refills  . finasteride (PROSCAR) 5 MG tablet   . gabapentin (NEURONTIN) 300 MG capsule Take 1  tablet in the morning and 2 tablets at bedtime  . hydrochlorothiazide (HYDRODIURIL) 50 MG tablet Take 0.5 tablets (25 mg total) by mouth daily.  . isosorbide mononitrate (IMDUR) 30 MG 24 hr tablet Take 1 tablet (30 mg total) by mouth daily.  . nitroGLYCERIN (NITROSTAT) 0.4 MG SL tablet Place 1 tablet (0.4 mg total) under the tongue every 5 (five) minutes as needed. Call 9-1-1 if need more than 2.  . pantoprazole (PROTONIX) 40 MG tablet Take 1 tablet by mouth every morning.  . potassium chloride (K-DUR,KLOR-CON) 10 MEQ tablet TAKE 1 TABLET DAILY  . ramipril (ALTACE) 10 MG capsule TAKE 1 CAPSULE TWICE A DAY  . rosuvastatin  (CRESTOR) 40 MG tablet Take 1 tablet (40 mg total) by mouth daily.   Allergies  Allergen Reactions  . Felodipine Swelling  . Niacin     REACTION: not tolerated.   Past Medical History:  Diagnosis Date  . Bradycardia   . Carotid artery disease ([place])    Right CEA;  dopplers 6/12:  0-39% bilat ICA  . Coronary artery disease    s/p BMS to RCA 2002; [place] 2008: oLAD 40%, pRCA stent ok with 20%, EF 60%); Myoview scan in March 2011 which was negative for ischemia   . GERD (gastroesophageal reflux disease)   . Hyperlipidemia   . Hypertension   . Myocardial infarction ([place])   . Obstructive sleep apnea    Family History  Problem Relation Age of Onset  . Heart failure Father   . Heart disease Father   . Peripheral vascular disease Mother        with pacemaker, and had a carotid endarterectomy  . Heart disease Mother   . Hypertension Brother   . Hypertension Brother    Past Surgical History:  Procedure Laterality Date  . CARDIAC CATHETERIZATION  06/01/2007    EF of 65% -- Nonobstructive CAD with 40% ostial stenosis in the LAD, no significant obstruction in the circumflex artery, 20% narrowing within the stent in the proximal to mid right coronary and normal LV function  . CARDIAC CATHETERIZATION  02/02/2006   EF of 50%  . CARDIAC CATHETERIZATION  01/19/2002   EF of 60%  . CARDIAC CATHETERIZATION N/A 12/03/2015   Procedure: Left Heart Cath and Coronary Angiography;  Surgeon: Burnell Blanks, MD;  Location: [facility];  Service: Cardiovascular;  Laterality: N/A;  . CAROTID ENDARTERECTOMY  06/26/2002   right  . CORONARY ANGIOPLASTY WITH STENT PLACEMENT  03/08/2000   Bare-metal stent in RCA, in [place]  . KNEE ARTHROSCOPY     right  . MOLE REMOVAL     Prior moles removed   Social History   Socioeconomic History  . Marital status: Married    Spouse name: Not on file  . Number of children: 3  . Years of education: 47  . Highest education level: High school  graduate  Occupational History  . Occupation: Barista  . Financial resource strain: Not on file  . Food insecurity:    Worry: Not on file    Inability: Not on file  . Transportation needs:    Medical: Not on file    Non-medical: Not on file  Tobacco Use  . Smoking status: Former Smoker    Packs/day: 1.00    Years: 30.00    Pack years: 30.00    Types: Cigarettes    Last attempt to quit: 10/19/2000    Years since quitting: 17.5  . Smokeless tobacco:  Current User    Types: Snuff  . Tobacco comment: Dips snuff.   Substance and Sexual Activity  . Alcohol use: Yes    Comment: Occasional beer  . Drug use: No  . Sexual activity: Not on file  Lifestyle  . Physical activity:    Days per week: Not on file    Minutes per session: Not on file  . Stress: Not on file  Relationships  . Social connections:    Talks on phone: Not on file    Gets together: Not on file    Attends religious service: Not on file    Active member of club or organization: Not on file    Attends meetings of clubs or organizations: Not on file    Relationship status: Not on file  . Intimate partner violence:    Fear of current or ex partner: Not on file    Emotionally abused: Not on file    Physically abused: Not on file    Forced sexual activity: Not on file  Other Topics Concern  . Not on file  Social History Narrative   Lives with wife in a one story home.  Has 3 children.  Works in Architect.  Education: high school.      Review of Systems: General: negative for chills, fever, night sweats or weight changes.  Cardiovascular: negative for chest pain, dyspnea on exertion, edema, orthopnea, palpitations, paroxysmal nocturnal dyspnea or shortness of breath Dermatological: negative for rash Respiratory: negative for cough or wheezing Urologic: negative for hematuria Abdominal: negative for nausea, vomiting, diarrhea, bright red blood per rectum, melena, or hematemesis Neurologic:  negative for visual changes, syncope, or dizziness All other systems reviewed and are otherwise negative except as noted above.   Physical Exam:  Height 5\' 9"  (1.753 m).  General appearance: alert, cooperative and no distress Neck: no carotid bruit and no JVD Lungs: clear to auscultation bilaterally Heart: regular rate and rhythm, S1, S2 normal, no murmur, click, rub or gallop Extremities: extremities normal, atraumatic, no cyanosis or edema Pulses: 2+ and symmetric Skin: Skin color, texture, turgor normal. No rashes or lesions Neurologic: Grossly normal  EKG NSR 73 bpm -- personally reviewed   ASSESSMENT AND PLAN:   1. Marked Fatigue and CP/ left arm pain: given his recent symptoms and known history, we will order an Lexiscan NST to r/o ischemia. Will also move up 48 hr monitor to assess HR trend and r/o significant bradycardia. His atenolol was discontinued last week when HR was in the 40s. HR currently in the 70s on EKG. Recent labs at PCP unremarkable, including CBC and TSH. We will get an EF assessment on nuclear study. If reduced EF, then we will check 2D echo.   2. CAD: per above. History of RCA stenting in 2002. Patent stent on cath 11/2015 with mild nonobstructive CAD, 30-40% diffuse disease. Given recent symptoms outlined above, we will order NST.   Follow-Up after stress test and 48 hr monitor.   Brittainy Ladoris Gene, MHS [facility] 05/03/2018 2:45 PM

## 2018-05-03 NOTE — Patient Instructions (Addendum)
Your physician recommends that you continue on your current medications as directed. Please refer to the Current Medication list given to you today.  Your physician has recommended that you wear a holter monitor. Holter monitors are medical devices that record the heart's electrical activity. Doctors most often use these monitors to diagnose arrhythmias. Arrhythmias are problems with the speed or rhythm of the heartbeat. The monitor is a small, portable device. You can wear one while you do your normal daily activities. This is usually used to diagnose what is causing palpitations/syncope (passing out).  MOVE UP HOLTER APPT PT FEELING WORSE  Your physician has requested that you have a lexiscan myoview. For further information please visit [email or web address]. Please follow instruction sheet, as given. Your physician recommends that you schedule a follow-up appointment in:  [place]

## 2018-05-03 NOTE — Telephone Encounter (Signed)
Would not start atenolol back given slow HR.  Awaiting monitor results to see if there is more severe bradycardia causing fatigue.  Await ECG tomorrow at visit and details of any presyncope or syncope.

## 2018-05-04 ENCOUNTER — Telehealth (HOSPITAL_COMMUNITY): Payer: Self-pay

## 2018-05-04 ENCOUNTER — Encounter: Payer: Self-pay | Admitting: Gastroenterology

## 2018-05-04 NOTE — Telephone Encounter (Signed)
Encounter complete. 

## 2018-05-06 ENCOUNTER — Ambulatory Visit (HOSPITAL_COMMUNITY)
Admission: RE | Admit: 2018-05-06 | Discharge: 2018-05-06 | Disposition: A | Discharge status: Home / Self Care | Payer: Managed Care, Other (non HMO) | Source: Ambulatory Visit | Attending: Cardiology | Admitting: Cardiology

## 2018-05-06 ENCOUNTER — Encounter (HOSPITAL_COMMUNITY): Payer: Managed Care, Other (non HMO)

## 2018-05-06 ENCOUNTER — Telehealth: Payer: Self-pay | Admitting: Cardiology

## 2018-05-06 DIAGNOSIS — R0789 Other chest pain: Secondary | ICD-10-CM | POA: Diagnosis present

## 2018-05-06 DIAGNOSIS — I6523 Occlusion and stenosis of bilateral carotid arteries: Secondary | ICD-10-CM

## 2018-05-06 LAB — MYOCARDIAL PERFUSION IMAGING
LV dias vol: 117 mL (ref 62–150)
LV sys vol: 51 mL
Peak HR: 85 {beats}/min
Rest HR: 60 {beats}/min
SDS: 1
SRS: 0
SSS: 1
TID: 1.09

## 2018-05-06 MED ORDER — TECHNETIUM TC 99M TETROFOSMIN IV KIT
10.1000 | PACK | Freq: Once | INTRAVENOUS | Status: AC | PRN
  Administered 2018-05-06: 10.1 via INTRAVENOUS
  Filled 2018-05-06: qty 11

## 2018-05-06 MED ORDER — REGADENOSON 0.4 MG/5ML IV SOLN
0.4000 mg | Freq: Once | INTRAVENOUS | Status: AC
  Administered 2018-05-06: 0.4 mg via INTRAVENOUS

## 2018-05-06 MED ORDER — TECHNETIUM TC 99M TETROFOSMIN IV KIT
30.4000 | PACK | Freq: Once | INTRAVENOUS | Status: AC | PRN
  Administered 2018-05-06: 30.4 via INTRAVENOUS
  Filled 2018-05-06: qty 31

## 2018-05-06 NOTE — Progress Notes (Signed)
error 

## 2018-05-06 NOTE — Telephone Encounter (Signed)
Pt aware of test results; had no additional questions.

## 2018-05-06 NOTE — Telephone Encounter (Signed)
New Message: ° ° ° ° ° ° °Pt is returning a call for results. °

## 2018-05-12 ENCOUNTER — Encounter: Payer: Managed Care, Other (non HMO) | Admitting: Gastroenterology

## 2018-05-13 ENCOUNTER — Inpatient Hospital Stay (HOSPITAL_COMMUNITY): Admission: RE | Admit: 2018-05-13 | Payer: Managed Care, Other (non HMO) | Source: Ambulatory Visit

## 2018-05-19 ENCOUNTER — Ambulatory Visit: Payer: Managed Care, Other (non HMO) | Admitting: Nurse Practitioner

## 2018-05-19 ENCOUNTER — Ambulatory Visit: Payer: Managed Care, Other (non HMO) | Admitting: Cardiology

## 2018-05-19 ENCOUNTER — Encounter: Payer: Self-pay | Admitting: Cardiology

## 2018-05-19 VITALS — BP 130/70 | HR 86 | Ht 69.0 in | Wt 202.8 lb

## 2018-05-19 DIAGNOSIS — R5383 Other fatigue: Secondary | ICD-10-CM

## 2018-05-19 NOTE — Patient Instructions (Addendum)
Medication Instructions:  Your physician recommends that you continue on your current medications as directed. Please refer to the Current Medication list given to you today.  Labwork: None ordered.   Testing/Procedures: None ordered.  Follow-Up: Your physician recommends that you schedule a follow-up appointment with your Primary Care Physician.   Your physician wants you to follow-up in: 6 months with Dr Julianne Handler. You will receive a reminder letter in the mail two months in advance. If you don't receive a letter, please call our office to schedule the follow-up appointment.   Any Other Special Instructions Will Be Listed Below (If Applicable).     If you need a refill on your cardiac medications before your next appointment, please call your pharmacy.

## 2018-05-19 NOTE — Progress Notes (Signed)
05/19/2018 [name]   [birth year]  [id number]  Primary Physician Lance Sell, NP Primary Cardiologist: Dr. Angelena Form  Reason for Visit/CC: F/u for fatigue   HPI:  [name] is a 67 y.o. male who is being seen today for the evaluation of fatigue. He has a history of CAD, HTN, hyperlipidemia, carotid artery disease s/p CEA. He has been followed in the past by Dr. Olevia Perches. Now followed by Dr. Angelena Form. In 2002, he had a bare metal stent placed to the right coronary artery. A catheterization in 2008 showed non-obstructive disease. Had a Myoview scan in March 2011 which was negative for ischemia. He had duplex studies of his carotid in June 2012 with mild disease and stable right CEA site. He had a repeat cardiac cath 11/2015 which showed patent RCA stent and mild nonobstructive CAD (30-40% diffuse disease). Medical therapy recommended.   He was recently seen by Estella Husk, PA-C, for preop evaluation prior to an EGD/colonoscopy ordered by PCP given frequent "indigestion". He denied cardiac symptoms and was cleared for procedure. Dr. Angelena Form had agreed that he could hold Plavix. At that appt, he was noted to be bradycardic w/ HR in the 40s. Thus he was ordered to stop atenolol and to get a 48 hr holter monitor to assess HR, once off of BB.   Since stopping his atenolol, he has been markedly fatigue. No energy. Hasn't been able to go to work. I evaluated him on 05/03/18 and he had also endorsed intermittent left sided chest and shoulder pain, lasting ~20 min at a time. Feels like pressure. Occurs randomly w/ no particular pattern. Sometimes at rest and with exertion, but not always with activity. Not worsened or triggered by meals. He also notes syncope ~ 1 month ago but no recurrence. Constantly feels dizzy. Also of note, there is no anemia. He had not yet completed monitor order. I reviewed his labs. Recent CBC showed normal Hgb at 14.1. TSH also WNL. His PCP ordered Vit D lab, not yet  completed.   Given his symptoms, I order for him to undergo a nuclear stress test as well as repeat carotid Dopplers to rule out carotid artery stenosis.  His stress test was negative for ischemia. EF normal at 58%. His carotid Doppler  also showed 1 to 39% bilateral ICA stenosis.  His 48-hour monitor showed market nocturnal bradycardia with heart rates in the upper 30s around 4AM and some occasional burst of SVT lasting up to 3 seconds.  No other arrhthymias. This was reviewed by Dr. Angelena Form.  He recommended the patient continue to stay off of beta-blocker therapy due to his nocturnal bradycardia.  He pesents back to clinic today for follow-up.  Here notes that he remains markedly fatigued.  He states that he cannot do much of anything.  He has no energy.  He has been unable to return to work.  He denies any recurrent shoulder pain since his last OV.  No chest pain.  He denies syncope/near syncope.  His blood pressure is stable at 130/70.  Oxygen saturation is 96% on room air.  Pulse rate is 86 bpm.  I discussed with him that we have not found any cardiovascular causes for his symptoms and I strongly encouraged for him to follow-up again with his PCP to get vitamin D checked as originally ordered and also see if there is any additional work-up that PCP would recommend for his symptoms.    Cardiac Studies   NST 05/06/18 Study  Highlights     The left ventricular ejection fraction is normal (55-65%).  Nuclear stress EF: 56%.  There was no ST segment deviation noted during stress.  No T wave inversion was noted during stress.  The study is normal.  This is a low risk study.   Low risk stress nuclear study with normal perfusion and normal left ventricular regional and global systolic function.   59 Hr Holter Monitor 05/06/18 Study Highlights   Sinus rhythm Nocturnal bradycardia. Lowest heart rate 36 bpm around 4 am.  Premature atrial contractions (773 during monitoring period) Several  short bursts of supraventricular tachycardia lasting less than 3 seconds.  Rare premature ventricular contractions.   Would not restart beta blocker given nighttime bradycardia.       Bilateral Carotid Dopplers 05/06/18 Final Interpretation: Right Carotid: Velocities in the right ICA are consistent with a 1-39% stenosis.        Non-hemodynamically significant plaque <50% noted in the CCA.  Left Carotid: Velocities in the left ICA are consistent with a 1-39% stenosis.       Non-hemodynamically significant plaque noted in the CCA.  Vertebrals: Bilateral vertebral arteries demonstrate antegrade flow. Subclavians: Normal flow hemodynamics were seen in bilateral subclavian       arteries.  Current Meds  Medication Sig  . amLODipine (NORVASC) 10 MG tablet Take 1 tablet by mouth daily  . aspirin 81 MG tablet Take 81 mg by mouth daily.    Marland Kitchen b complex vitamins capsule Take 1 capsule by mouth daily.  Marland Kitchen doxazosin (CARDURA) 2 MG tablet Take 1 tablet (2 mg total) by mouth daily.  Marland Kitchen ezetimibe (ZETIA) 10 MG tablet Take 1 tablet (10 mg total) by mouth daily. Annual appt due in July must see provider for future refills  . finasteride (PROSCAR) 5 MG tablet   . gabapentin (NEURONTIN) 300 MG capsule Take 1 tablet in the morning and 2 tablets at bedtime  . hydrochlorothiazide (HYDRODIURIL) 50 MG tablet Take 0.5 tablets (25 mg total) by mouth daily.  . isosorbide mononitrate (IMDUR) 30 MG 24 hr tablet Take 1 tablet (30 mg total) by mouth daily.  . nitroGLYCERIN (NITROSTAT) 0.4 MG SL tablet Place 1 tablet (0.4 mg total) under the tongue every 5 (five) minutes as needed. Call 9-1-1 if need more than 2.  . pantoprazole (PROTONIX) 40 MG tablet Take 1 tablet by mouth every morning.  . potassium chloride (K-DUR,KLOR-CON) 10 MEQ tablet TAKE 1 TABLET DAILY  . ramipril (ALTACE) 10 MG capsule TAKE 1 CAPSULE TWICE A DAY  . rosuvastatin (CRESTOR) 40 MG tablet Take 1 tablet (40 mg total) by  mouth daily.   Allergies  Allergen Reactions  . Felodipine Swelling  . Niacin     REACTION: not tolerated.   Past Medical History:  Diagnosis Date  . Bradycardia   . Carotid artery disease ([place])    Right CEA;  dopplers 6/12:  0-39% bilat ICA  . Coronary artery disease    s/p BMS to RCA 2002; [place] 2008: oLAD 40%, pRCA stent ok with 20%, EF 60%); Myoview scan in March 2011 which was negative for ischemia   . GERD (gastroesophageal reflux disease)   . Hyperlipidemia   . Hypertension   . Myocardial infarction ([place])   . Obstructive sleep apnea    Family History  Problem Relation Age of Onset  . Heart failure Father   . Heart disease Father   . Peripheral vascular disease Mother        with  pacemaker, and had a carotid endarterectomy  . Heart disease Mother   . Hypertension Brother   . Hypertension Brother    Past Surgical History:  Procedure Laterality Date  . CARDIAC CATHETERIZATION  06/01/2007    EF of 65% -- Nonobstructive CAD with 40% ostial stenosis in the LAD, no significant obstruction in the circumflex artery, 20% narrowing within the stent in the proximal to mid right coronary and normal LV function  . CARDIAC CATHETERIZATION  02/02/2006   EF of 50%  . CARDIAC CATHETERIZATION  01/19/2002   EF of 60%  . CARDIAC CATHETERIZATION N/A 12/03/2015   Procedure: Left Heart Cath and Coronary Angiography;  Surgeon: Burnell Blanks, MD;  Location: [facility];  Service: Cardiovascular;  Laterality: N/A;  . CAROTID ENDARTERECTOMY  06/26/2002   right  . CORONARY ANGIOPLASTY WITH STENT PLACEMENT  03/08/2000   Bare-metal stent in RCA, in [place]  . KNEE ARTHROSCOPY     right  . MOLE REMOVAL     Prior moles removed   Social History   Socioeconomic History  . Marital status: Married    Spouse name: Not on file  . Number of children: 3  . Years of education: 41  . Highest education level: High school graduate  Occupational History  . Occupation: Charity fundraiser  . Financial resource strain: Not on file  . Food insecurity:    Worry: Not on file    Inability: Not on file  . Transportation needs:    Medical: Not on file    Non-medical: Not on file  Tobacco Use  . Smoking status: Former Smoker    Packs/day: 1.00    Years: 30.00    Pack years: 30.00    Types: Cigarettes    Last attempt to quit: 10/19/2000    Years since quitting: 17.5  . Smokeless tobacco: Current User    Types: Snuff  . Tobacco comment: Dips snuff.   Substance and Sexual Activity  . Alcohol use: Yes    Comment: Occasional beer  . Drug use: No  . Sexual activity: Not on file  Lifestyle  . Physical activity:    Days per week: Not on file    Minutes per session: Not on file  . Stress: Not on file  Relationships  . Social connections:    Talks on phone: Not on file    Gets together: Not on file    Attends religious service: Not on file    Active member of club or organization: Not on file    Attends meetings of clubs or organizations: Not on file    Relationship status: Not on file  . Intimate partner violence:    Fear of current or ex partner: Not on file    Emotionally abused: Not on file    Physically abused: Not on file    Forced sexual activity: Not on file  Other Topics Concern  . Not on file  Social History Narrative   Lives with wife in a one story home.  Has 3 children.  Works in Architect.  Education: high school.      Review of Systems: General: negative for chills, fever, night sweats or weight changes.  Cardiovascular: negative for chest pain, dyspnea on exertion, edema, orthopnea, palpitations, paroxysmal nocturnal dyspnea or shortness of breath Dermatological: negative for rash Respiratory: negative for cough or wheezing Urologic: negative for hematuria Abdominal: negative for nausea, vomiting, diarrhea, bright red blood per rectum, melena, or hematemesis  Neurologic: negative for visual changes, syncope, or dizziness All other  systems reviewed and are otherwise negative except as noted above.   Physical Exam:  Blood pressure 130/70, pulse 86, height 5\' 9"  (1.753 m), weight 202 lb 12.8 oz (92 kg), SpO2 96 %.  General appearance: alert, cooperative and no distress Neck: no carotid bruit and no JVD Lungs: clear to auscultation bilaterally Heart: regular rate and rhythm, S1, S2 normal, no murmur, click, rub or gallop Extremities: extremities normal, atraumatic, no cyanosis or edema Pulses: 2+ and symmetric Skin: Skin color, texture, turgor normal. No rashes or lesions Neurologic: Grossly normal  EKG not performed -- personally reviewed   ASSESSMENT AND PLAN:   1. Marked Fatigue: He denies any CP at today's visit. W/u thus far is negative. Recent labs showed no anemia. Hgb 14.1. TSH normal. NST negative for ischemia with normal LVEF at 58%. 48 hr monitor showed some nocturnal bradycardia around 4AM and short burst of SVT, up to 3 sec, but no other arrhythmias.  BB was stopped several weeks ago. BP is stable at 130/70. Carotid dopplers show no stenosis. Physical exam is benign. I discussed with him that we have not found any cardiovascular causes for his symptoms and I strongly encouraged for him to follow-up again with his PCP to get vitamin D checked as originally ordered and also see if there is any additional work-up that PCP would recommend for his symptoms. I will also check with Dr. Angelena Form to see if he recommends any additional cardiac w/u.    2. CAD: per above. History of RCA stenting in 2002. Patent stent on cath 11/2015 with mild nonobstructive CAD, 30-40% diffuse disease. NST 04/2018 negative for ischemia with normal LVEF. Continue medical theapy. His BB has been discontinued due to nocturnal bradycardia.    Follow-Up w/ PCP within the next week. F/u with Dr. Angelena Form in 6 months.   Brittainy Ladoris Gene, MHS [facility] 05/19/2018 1:09 PM

## 2018-05-23 ENCOUNTER — Ambulatory Visit: Payer: Managed Care, Other (non HMO) | Admitting: Nurse Practitioner

## 2018-05-23 ENCOUNTER — Encounter: Payer: Self-pay | Admitting: Family

## 2018-05-23 ENCOUNTER — Ambulatory Visit: Payer: Managed Care, Other (non HMO) | Admitting: Family

## 2018-05-23 ENCOUNTER — Other Ambulatory Visit (INDEPENDENT_AMBULATORY_CARE_PROVIDER_SITE_OTHER): Payer: Managed Care, Other (non HMO)

## 2018-05-23 VITALS — BP 132/70 | HR 79 | Temp 98.1°F | Ht 69.0 in | Wt 202.1 lb

## 2018-05-23 DIAGNOSIS — R5383 Other fatigue: Secondary | ICD-10-CM

## 2018-05-23 DIAGNOSIS — G4730 Sleep apnea, unspecified: Secondary | ICD-10-CM

## 2018-05-23 LAB — VITAMIN D 25 HYDROXY (VIT D DEFICIENCY, FRACTURES): VITD: 32.5 ng/mL (ref 30.00–100.00)

## 2018-05-23 NOTE — Patient Instructions (Signed)
Stop Gabapentin as discussed; let us know how you are feeling by the end of the week;

## 2018-05-23 NOTE — Progress Notes (Signed)
[name] is a 67 y.o. male with the following history as recorded in EpicCare:  Patient Active Problem List   Diagnosis Date Noted  . Preoperative clearance 04/26/2018  . Bradycardia 04/26/2018  . Dizziness 04/26/2018  . Hypersomnia with sleep apnea 12/05/2015  . Unstable angina ([place]) 12/02/2015  . Plantar fasciitis, bilateral 12/13/2012  . Carotid artery disease ([place]) 11/24/2010  . BRADYCARDIA 04/12/2009  . Coronary atherosclerosis 12/25/2008  . Obstructive sleep apnea 02/03/2008  . MYOCARDIAL INFARCTION 02/03/2008  . Hyperlipidemia 10/08/2007  . Essential hypertension 10/08/2007  . GASTROESOPHAGEAL REFLUX DISEASE 10/08/2007    Current Outpatient Medications  Medication Sig Dispense Refill  . amLODipine (NORVASC) 10 MG tablet Take 1 tablet by mouth daily 90 tablet 1  . aspirin 81 MG tablet Take 81 mg by mouth daily.      Marland Kitchen b complex vitamins capsule Take 1 capsule by mouth daily.    . clopidogrel (PLAVIX) 75 MG tablet Take 75 mg by mouth daily.    Marland Kitchen doxazosin (CARDURA) 2 MG tablet Take 1 tablet (2 mg total) by mouth daily. 180 tablet 1  . ezetimibe (ZETIA) 10 MG tablet Take 1 tablet (10 mg total) by mouth daily. Annual appt due in July must see provider for future refills 90 tablet 1  . finasteride (PROSCAR) 5 MG tablet     . gabapentin (NEURONTIN) 300 MG capsule Take 1 tablet in the morning and 2 tablets at bedtime 90 capsule 11  . hydrochlorothiazide (HYDRODIURIL) 50 MG tablet Take 0.5 tablets (25 mg total) by mouth daily. 45 tablet 1  . isosorbide mononitrate (IMDUR) 30 MG 24 hr tablet Take 1 tablet (30 mg total) by mouth daily. 90 tablet 1  . nitroGLYCERIN (NITROSTAT) 0.4 MG SL tablet Place 1 tablet (0.4 mg total) under the tongue every 5 (five) minutes as needed. Call 9-1-1 if need more than 2. 25 tablet 0  . pantoprazole (PROTONIX) 40 MG tablet Take 1 tablet by mouth every morning. 90 tablet 3  . potassium chloride (K-DUR,KLOR-CON) 10 MEQ tablet TAKE 1 TABLET DAILY 90  tablet 0  . ramipril (ALTACE) 10 MG capsule TAKE 1 CAPSULE TWICE A DAY 180 capsule 1  . rosuvastatin (CRESTOR) 40 MG tablet Take 1 tablet (40 mg total) by mouth daily. 90 tablet 1   No current facility-administered medications for this visit.     Allergies: Felodipine and Niacin  Past Medical History:  Diagnosis Date  . Bradycardia   . Carotid artery disease ([place])    Right CEA;  dopplers 6/12:  0-39% bilat ICA  . Coronary artery disease    s/p BMS to RCA 2002; [place] 2008: oLAD 40%, pRCA stent ok with 20%, EF 60%); Myoview scan in March 2011 which was negative for ischemia   . GERD (gastroesophageal reflux disease)   . Hyperlipidemia   . Hypertension   . Myocardial infarction ([place])   . Obstructive sleep apnea     Past Surgical History:  Procedure Laterality Date  . CARDIAC CATHETERIZATION  06/01/2007    EF of 65% -- Nonobstructive CAD with 40% ostial stenosis in the LAD, no significant obstruction in the circumflex artery, 20% narrowing within the stent in the proximal to mid right coronary and normal LV function  . CARDIAC CATHETERIZATION  02/02/2006   EF of 50%  . CARDIAC CATHETERIZATION  01/19/2002   EF of 60%  . CARDIAC CATHETERIZATION N/A 12/03/2015   Procedure: Left Heart Cath and Coronary Angiography;  Surgeon: Burnell Blanks, MD;  Location: [place] [facility];  Service: Cardiovascular;  Laterality: N/A;  . CAROTID ENDARTERECTOMY  06/26/2002   right  . CORONARY ANGIOPLASTY WITH STENT PLACEMENT  03/08/2000   Bare-metal stent in RCA, in [place]  . KNEE ARTHROSCOPY     right  . MOLE REMOVAL     Prior moles removed    Family History  Problem Relation Age of Onset  . Heart failure Father   . Heart disease Father   . Peripheral vascular disease Mother        with pacemaker, and had a carotid endarterectomy  . Heart disease Mother   . Hypertension Brother   . Hypertension Brother     Social History   Tobacco Use  . Smoking status: Former Smoker     Packs/day: 1.00    Years: 30.00    Pack years: 30.00    Types: Cigarettes    Last attempt to quit: 10/19/2000    Years since quitting: 17.6  . Smokeless tobacco: Current User    Types: Snuff  . Tobacco comment: Dips snuff.   Substance Use Topics  . Alcohol use: Yes    Comment: Occasional beer    Subjective:  Patient presents with symptoms of worsening fatigue; notes that symptoms seem to start suddenly in the past 4-6 weeks; does have a CPAP- wears nightly as directed; thinks last sleep study was done almost 10 years ago; has had extensive cardiac testing and had Beta Blocker stopped; he thinks he actually was more tired after the beta blocker was stopped; extensive labs have been checked to try and determine a source for the symptoms; he was instructed to return for updated Vitamin D check but works outside daily; denies any recent tick bites; in reviewing medication list, Neurontin is the medication that was most recently started. He notes the neurologist increased the dosage when he was there in June- taking 300 mg in the am and 300 mg in the pm; does not feel that Neurontin is beneficial;   Objective:  Vitals:   05/23/18 1337  BP: 132/70  Pulse: 79  Temp: 98.1 F (36.7 C)  TempSrc: Oral  SpO2: 97%  Weight: 202 lb 1.9 oz (91.7 kg)  Height: 5\' 9"  (1.753 m)    General: Well developed, well nourished, in no acute distress  Skin : Warm and dry.  Head: Normocephalic and atraumatic  Lungs: Respirations unlabored; clear to auscultation bilaterally without wheeze, rales, rhonchi  CVS exam: normal rate and regular rhythm.  Neurologic: Alert and oriented; speech intact; face symmetrical; moves all extremities well; CNII-XII intact without focal deficit   Assessment:  1. Other fatigue   2. Sleep apnea, unspecified type     Plan:  1. ? If symptoms could be related to Neurontin as timeline is consistent with starting this medication/ increasing dosage; hold the medication for now; he will  also get his Vitamin D level checked today; may eventually need to consider changing his Amlodipine as well if the leg swelling continues to be problematic; do not want to make too many medication changes at one time. 2. Update sleep study as his machine is 31+ years old.  Spent 30+ minutes with patient reviewing notes/ symptoms and discussing treatment plans.    No follow-ups on file.  Orders Placed This Encounter  Procedures  . Ambulatory referral to Neurology    Referral Priority:   Routine    Referral Type:   Consultation    Referral Reason:   Specialty Services  Required    Requested Specialty:   Neurology    Number of Visits Requested:   1    Requested Prescriptions    No prescriptions requested or ordered in this encounter

## 2018-05-24 ENCOUNTER — Telehealth: Payer: Self-pay | Admitting: *Deleted

## 2018-05-24 NOTE — Telephone Encounter (Signed)
-----   Message from Consuelo Pandy, [place] sent at 05/21/2018  5:04 PM EDT ----- Can you let pt know that I updated Dr. Angelena Form. He looked through is chart and does not recommend any additional cardiac w/u at this time. Make sure he was able to get a f/u with his PCP for further evaluation of his fatigue, please. Thank you!  ----- Message ----- From: Burnell Blanks, MD Sent: 05/20/2018   1:11 PM To: Brittainy Erie Noe, PA-C  I looked through your note. I think you have been really thorough with your workup. I don't know of anything else to do right now. Thanks for seeing him. Gerald Stabs  ----- Message ----- From: Consuelo Pandy, PA-C Sent: 05/19/2018   5:09 PM To: Burnell Blanks, MD  CM, can you please review chart on this pt. He has been complaining of marked fatigue. Recent Hgb and TSH ok. NST negative for ischemia. EF normal by nuclear study, 58%. 48 hr heart monitor showed some nocturnal bradycardia and a few short burst of SVT, but no other arrhthymias. His BB was stopped. No murmurs on exam. Would you recommend any further cardiac w/u. ? Echo. I'm also sending him back to see his PCP.

## 2018-05-24 NOTE — Telephone Encounter (Signed)
I spoke with pt and gave him information from B. Rosita Fire, [place].  Pt reports he saw primary care yesterday and is continuing to follow in their office.

## 2018-05-25 ENCOUNTER — Telehealth: Payer: Self-pay | Admitting: *Deleted

## 2018-05-25 NOTE — Telephone Encounter (Signed)
To reiterate, As of 05-06-2018, the patient called back to cancel procedure that was scheduled for 05-12-2018. He stated his cardiologist does not want him to have a colonoscopy at this time.  He has congestive heart failure.

## 2018-05-27 ENCOUNTER — Telehealth: Payer: Self-pay

## 2018-05-27 NOTE — Telephone Encounter (Signed)
I am glad to hear that he is moving in a positive direction; I am glad he is going to get that sleep study- I am sorry it can't be done until next month.

## 2018-05-27 NOTE — Telephone Encounter (Signed)
Copied from [place] [id number]. Topic: General - Other >> May 27, 2018  1:02 PM Carolyn Stare wrote:  Pt wanted Mickel Baas to know that he has good days and bad days after being off the medicine but is better than it was was and is still feeling tired sleep study is scheduled for 07/14/18

## 2018-05-30 ENCOUNTER — Ambulatory Visit: Payer: Managed Care, Other (non HMO) | Admitting: Nurse Practitioner

## 2018-05-30 ENCOUNTER — Observation Stay (HOSPITAL_COMMUNITY)
Admission: EM | Admit: 2018-05-30 | Discharge: 2018-05-31 | Disposition: A | Discharge status: Home / Self Care | Payer: Managed Care, Other (non HMO) | Attending: Cardiovascular Disease | Admitting: Cardiovascular Disease

## 2018-05-30 ENCOUNTER — Emergency Department (HOSPITAL_COMMUNITY): Payer: Managed Care, Other (non HMO)

## 2018-05-30 ENCOUNTER — Other Ambulatory Visit: Payer: Self-pay

## 2018-05-30 ENCOUNTER — Encounter (HOSPITAL_COMMUNITY): Payer: Self-pay | Admitting: Emergency Medicine

## 2018-05-30 ENCOUNTER — Encounter: Payer: Self-pay | Admitting: Nurse Practitioner

## 2018-05-30 ENCOUNTER — Ambulatory Visit: Payer: Self-pay | Admitting: *Deleted

## 2018-05-30 VITALS — BP 118/62 | HR 78 | Temp 97.8°F | Resp 16 | Ht 69.0 in | Wt 191.0 lb

## 2018-05-30 DIAGNOSIS — I252 Old myocardial infarction: Secondary | ICD-10-CM | POA: Diagnosis not present

## 2018-05-30 DIAGNOSIS — Z79899 Other long term (current) drug therapy: Secondary | ICD-10-CM | POA: Insufficient documentation

## 2018-05-30 DIAGNOSIS — R5383 Other fatigue: Secondary | ICD-10-CM

## 2018-05-30 DIAGNOSIS — I480 Paroxysmal atrial fibrillation: Secondary | ICD-10-CM | POA: Diagnosis present

## 2018-05-30 DIAGNOSIS — I10 Essential (primary) hypertension: Secondary | ICD-10-CM

## 2018-05-30 DIAGNOSIS — Z7901 Long term (current) use of anticoagulants: Secondary | ICD-10-CM | POA: Insufficient documentation

## 2018-05-30 DIAGNOSIS — Z888 Allergy status to other drugs, medicaments and biological substances status: Secondary | ICD-10-CM | POA: Insufficient documentation

## 2018-05-30 DIAGNOSIS — I110 Hypertensive heart disease with heart failure: Secondary | ICD-10-CM | POA: Insufficient documentation

## 2018-05-30 DIAGNOSIS — R0602 Shortness of breath: Secondary | ICD-10-CM | POA: Diagnosis not present

## 2018-05-30 DIAGNOSIS — I2510 Atherosclerotic heart disease of native coronary artery without angina pectoris: Secondary | ICD-10-CM | POA: Insufficient documentation

## 2018-05-30 DIAGNOSIS — I6529 Occlusion and stenosis of unspecified carotid artery: Secondary | ICD-10-CM | POA: Diagnosis not present

## 2018-05-30 DIAGNOSIS — Z7902 Long term (current) use of antithrombotics/antiplatelets: Secondary | ICD-10-CM | POA: Diagnosis not present

## 2018-05-30 DIAGNOSIS — Z87891 Personal history of nicotine dependence: Secondary | ICD-10-CM | POA: Diagnosis not present

## 2018-05-30 DIAGNOSIS — Z955 Presence of coronary angioplasty implant and graft: Secondary | ICD-10-CM | POA: Insufficient documentation

## 2018-05-30 DIAGNOSIS — I0989 Other specified rheumatic heart diseases: Secondary | ICD-10-CM | POA: Insufficient documentation

## 2018-05-30 DIAGNOSIS — R0789 Other chest pain: Secondary | ICD-10-CM | POA: Insufficient documentation

## 2018-05-30 DIAGNOSIS — G4733 Obstructive sleep apnea (adult) (pediatric): Secondary | ICD-10-CM | POA: Diagnosis not present

## 2018-05-30 DIAGNOSIS — Z8546 Personal history of malignant neoplasm of prostate: Secondary | ICD-10-CM | POA: Insufficient documentation

## 2018-05-30 DIAGNOSIS — E7800 Pure hypercholesterolemia, unspecified: Secondary | ICD-10-CM | POA: Diagnosis not present

## 2018-05-30 DIAGNOSIS — K219 Gastro-esophageal reflux disease without esophagitis: Secondary | ICD-10-CM | POA: Diagnosis not present

## 2018-05-30 DIAGNOSIS — R001 Bradycardia, unspecified: Secondary | ICD-10-CM | POA: Insufficient documentation

## 2018-05-30 DIAGNOSIS — I471 Supraventricular tachycardia: Secondary | ICD-10-CM | POA: Insufficient documentation

## 2018-05-30 DIAGNOSIS — E785 Hyperlipidemia, unspecified: Secondary | ICD-10-CM | POA: Insufficient documentation

## 2018-05-30 DIAGNOSIS — Z9889 Other specified postprocedural states: Secondary | ICD-10-CM | POA: Insufficient documentation

## 2018-05-30 DIAGNOSIS — I5030 Unspecified diastolic (congestive) heart failure: Secondary | ICD-10-CM | POA: Insufficient documentation

## 2018-05-30 DIAGNOSIS — I4891 Unspecified atrial fibrillation: Secondary | ICD-10-CM | POA: Diagnosis not present

## 2018-05-30 DIAGNOSIS — Z8249 Family history of ischemic heart disease and other diseases of the circulatory system: Secondary | ICD-10-CM | POA: Insufficient documentation

## 2018-05-30 DIAGNOSIS — I420 Dilated cardiomyopathy: Secondary | ICD-10-CM | POA: Insufficient documentation

## 2018-05-30 DIAGNOSIS — I499 Cardiac arrhythmia, unspecified: Secondary | ICD-10-CM | POA: Diagnosis not present

## 2018-05-30 DIAGNOSIS — Z7982 Long term (current) use of aspirin: Secondary | ICD-10-CM | POA: Insufficient documentation

## 2018-05-30 HISTORY — DX: Dependence on other enabling machines and devices: Z99.89

## 2018-05-30 HISTORY — DX: Obstructive sleep apnea (adult) (pediatric): G47.33

## 2018-05-30 LAB — CBC WITH DIFFERENTIAL/PLATELET
Abs Immature Granulocytes: 0 10*3/uL (ref 0.0–0.1)
Basophils Absolute: 0 10*3/uL (ref 0.0–0.1)
Basophils Relative: 0 %
Eosinophils Absolute: 0.1 10*3/uL (ref 0.0–0.7)
Eosinophils Relative: 1 %
HCT: 42 % (ref 39.0–52.0)
Hemoglobin: 14.4 g/dL (ref 13.0–17.0)
Immature Granulocytes: 0 %
Lymphocytes Relative: 18 %
Lymphs Abs: 0.9 10*3/uL (ref 0.7–4.0)
MCH: 30.1 pg (ref 26.0–34.0)
MCHC: 34.3 g/dL (ref 30.0–36.0)
MCV: 87.7 fL (ref 78.0–100.0)
Monocytes Absolute: 0.6 10*3/uL (ref 0.1–1.0)
Monocytes Relative: 13 %
Neutro Abs: 3.3 10*3/uL (ref 1.7–7.7)
Neutrophils Relative %: 68 %
Platelets: 172 10*3/uL (ref 150–400)
RBC: 4.79 MIL/uL (ref 4.22–5.81)
RDW: 11.9 % (ref 11.5–15.5)
WBC: 4.9 10*3/uL (ref 4.0–10.5)

## 2018-05-30 LAB — COMPREHENSIVE METABOLIC PANEL
ALT: 44 U/L (ref 0–44)
AST: 30 U/L (ref 15–41)
Albumin: 3.9 g/dL (ref 3.5–5.0)
Alkaline Phosphatase: 49 U/L (ref 38–126)
Anion gap: 9 (ref 5–15)
BUN: 16 mg/dL (ref 8–23)
CO2: 24 mmol/L (ref 22–32)
Calcium: 9.2 mg/dL (ref 8.9–10.3)
Chloride: 106 mmol/L (ref 98–111)
Creatinine, Ser: 1.11 mg/dL (ref 0.61–1.24)
GFR calc Af Amer: >60 mL/min (ref >60)
GFR calc non Af Amer: >60 mL/min (ref >60)
Glucose, Bld: 77 mg/dL (ref 70–99)
Potassium: 3.7 mmol/L (ref 3.5–5.1)
Sodium: 139 mmol/L (ref 135–145)
Total Bilirubin: 1.4 mg/dL — ABNORMAL HIGH (ref 0.3–1.2)
Total Protein: 6.4 g/dL — ABNORMAL LOW (ref 6.5–8.1)

## 2018-05-30 LAB — MAGNESIUM: Magnesium: 2 mg/dL (ref 1.7–2.4)

## 2018-05-30 LAB — PROTIME-INR
INR: 1.01
Prothrombin Time: 13.2 seconds (ref 11.4–15.2)

## 2018-05-30 LAB — I-STAT TROPONIN, ED: Troponin i, poc: 0 ng/mL (ref 0.00–0.08)

## 2018-05-30 MED ORDER — ONDANSETRON HCL 4 MG/2ML IJ SOLN: 4.0000 mg | Freq: Four times a day (QID) | INTRAMUSCULAR | Status: DC | PRN

## 2018-05-30 MED ORDER — ROSUVASTATIN CALCIUM 40 MG PO TABS
40.0000 mg | ORAL_TABLET | Freq: Every day | ORAL | Status: DC
  Administered 2018-05-30 – 2018-05-31 (×2): 40 mg via ORAL
  Filled 2018-05-30 (×2): qty 1

## 2018-05-30 MED ORDER — DOXAZOSIN MESYLATE 2 MG PO TABS
2.0000 mg | ORAL_TABLET | Freq: Every day | ORAL | Status: DC
  Administered 2018-05-30 – 2018-05-31 (×2): 2 mg via ORAL
  Filled 2018-05-30 (×2): qty 1

## 2018-05-30 MED ORDER — APIXABAN 5 MG PO TABS
5.0000 mg | ORAL_TABLET | Freq: Two times a day (BID) | ORAL | Status: DC
  Administered 2018-05-30 – 2018-05-31 (×2): 5 mg via ORAL
  Filled 2018-05-30 (×3): qty 1

## 2018-05-30 MED ORDER — ASPIRIN EC 81 MG PO TBEC
81.0000 mg | DELAYED_RELEASE_TABLET | Freq: Every day | ORAL | Status: DC
  Administered 2018-05-30 – 2018-05-31 (×2): 81 mg via ORAL
  Filled 2018-05-30 (×2): qty 1

## 2018-05-30 MED ORDER — PANTOPRAZOLE SODIUM 40 MG PO TBEC
40.0000 mg | DELAYED_RELEASE_TABLET | Freq: Every day | ORAL | Status: DC
  Administered 2018-05-31: 40 mg via ORAL
  Filled 2018-05-30: qty 1

## 2018-05-30 MED ORDER — ACETAMINOPHEN 325 MG PO TABS: 650.0000 mg | ORAL_TABLET | ORAL | Status: DC | PRN

## 2018-05-30 MED ORDER — FINASTERIDE 5 MG PO TABS
5.0000 mg | ORAL_TABLET | Freq: Every day | ORAL | Status: DC
  Administered 2018-05-31: 5 mg via ORAL
  Filled 2018-05-30: qty 1

## 2018-05-30 MED ORDER — ISOSORBIDE MONONITRATE ER 30 MG PO TB24
30.0000 mg | ORAL_TABLET | Freq: Every day | ORAL | Status: DC
  Administered 2018-05-31: 30 mg via ORAL
  Filled 2018-05-30: qty 1

## 2018-05-30 MED ORDER — METOPROLOL TARTRATE 5 MG/5ML IV SOLN
5.0000 mg | INTRAVENOUS | Status: DC | PRN
  Administered 2018-05-30: 5 mg via INTRAVENOUS
  Filled 2018-05-30: qty 5

## 2018-05-30 MED ORDER — RAMIPRIL 10 MG PO CAPS: 10.0000 mg | ORAL_CAPSULE | Freq: Every day | ORAL | Status: DC

## 2018-05-30 MED ORDER — EZETIMIBE 10 MG PO TABS
10.0000 mg | ORAL_TABLET | Freq: Every day | ORAL | Status: DC
  Administered 2018-05-31: 10 mg via ORAL
  Filled 2018-05-30: qty 1

## 2018-05-30 MED ORDER — HYDROCHLOROTHIAZIDE 25 MG PO TABS
25.0000 mg | ORAL_TABLET | Freq: Every day | ORAL | Status: DC
  Administered 2018-05-31: 25 mg via ORAL
  Filled 2018-05-30: qty 1

## 2018-05-30 MED ORDER — AMLODIPINE BESYLATE 5 MG PO TABS
10.0000 mg | ORAL_TABLET | Freq: Every day | ORAL | Status: DC
  Administered 2018-05-31: 10 mg via ORAL
  Filled 2018-05-30: qty 2

## 2018-05-30 NOTE — Discharge Instructions (Addendum)
You have an appointment set up with the Atrial Fibrillation Clinic.  Multiple studies have shown that being followed by a dedicated atrial fibrillation clinic in addition to the standard care you receive from your other physicians improves health. We believe that enrollment in the atrial fibrillation clinic will allow us to better care for you.  ° °The phone number to the Atrial Fibrillation Clinic is [phone]. The clinic is staffed Monday through Friday from 8:30am to 5pm. ° °Parking Directions: The clinic is located in the Heart and Vascular Building connected to [facility]. °1)From [address] turn on to [address] and go to the 3rd entrance  (Heart and Vascular entrance) on the right. °2)Look to the right for Heart &Vascular Parking Garage. °3)A code for the entrance is required please call the clinic to receive this.   °4)Take the elevators to the 1st floor. Registration is in the room with the glass walls at the end of the hallway. ° °If you have any trouble parking or locating the clinic, please don’t hesitate to call [phone]. °Information on my medicine - ELIQUIS® (apixaban) ° °Why was Eliquis® prescribed for you? °Eliquis® was prescribed for you to reduce the risk of a blood clot forming that can cause a stroke if you have a medical condition called atrial fibrillation (a type of irregular heartbeat). ° °What do You need to know about Eliquis® ? °Take your Eliquis® TWICE DAILY - one tablet in the morning and one tablet in the evening with or without food. If you have difficulty swallowing the tablet whole please discuss with your pharmacist how to take the medication safely. ° °Take Eliquis® exactly as prescribed by your doctor and DO NOT stop taking Eliquis® without talking to the doctor who prescribed the medication.  Stopping may increase your risk of developing a stroke.  Refill your prescription before you run out. ° °After discharge, you should have regular check-up  appointments with your healthcare provider that is prescribing your Eliquis®.  In the future your dose may need to be changed if your kidney function or weight changes by a significant amount or as you get older. ° °What do you do if you miss a dose? °If you miss a dose, take it as soon as you remember on the same day and resume taking twice daily.  Do not take more than one dose of ELIQUIS at the same time to make up a missed dose. ° °Important Safety Information °A possible side effect of Eliquis® is bleeding. You should call your healthcare provider right away if you experience any of the following: °? Bleeding from an injury or your nose that does not stop. °? Unusual colored urine (red or dark brown) or unusual colored stools (red or black). °? Unusual bruising for unknown reasons. °? A serious fall or if you hit your head (even if there is no bleeding). ° °Some medicines may interact with Eliquis® and might increase your risk of bleeding or clotting while on Eliquis®. To help avoid this, consult your healthcare provider or pharmacist prior to using any new prescription or non-prescription medications, including herbals, vitamins, non-steroidal anti-inflammatory drugs (NSAIDs) and supplements. ° °This website has more information on Eliquis® (apixaban): http://www.eliquis.com/eliquis/home ° °

## 2018-05-30 NOTE — H&P (Signed)
Cardiology Admission History and Physical:   Patient ID: [name]; MRN: [id number]; DOB: 10/28/1950   Admission date: 05/30/2018  Primary Care Provider: Lance Sell, NP Primary Cardiologist: Lauree Chandler, MD    Chief Complaint:  Fatigue, shortness of breath  Patient Profile:   [name] is a 67 y.o. male with a history of CAD s/p PCI, hypertension, hyperlipidemia, OSA, carotid stenosis s/p CEA, and GERD here with fatigue and shortness of breath and found to have new onset atrial fibrillation with rapid ventricular response.  History of Present Illness:   [name] [name] had a BMP implanted in the RCA in 2002.  Since that time he had a LHC in 2008 that showed his stent was patent and he had otherwise nonobstructive disease.  LHC 11/2015 was similar.    He had a Lexiscan Myoview in 2011 that was negative for ischemia.  He was seen for preoperative clearance prior to EGD/colonoscopy and was noted to be bradycardic to the 40s 04/2018.  At the time he was on atenolol 25mg  which was discontinued.  On 48 hour Holter he was noted to be bradcyardic to the 40s overnight as well as 3 second runs of SVT.  He has known OSA and is compliant with CPAP.  Since that time he continues to be very fatigued.  He is typically active and has been on FMLA from work because he cannot walk short distances without palpitations and shortness of breath.   He also noted intermittent L sided chest discomfort with radiation to the L shoulder.  He checks his HR and BP at home and his monitor sometimes reports and irregular heart rhythm. Repeat Lexiscan Myoview 05/06/18 was negative for ischemia.  LVEF was 56%.    For the last two days his symptoms have been more constant and intense.  His heart rate was initially in the 130s.  This improved to less than 100 after 1 dose of IV metoprolol.  He was started on and Eliquis for anticoagulation.  He converted spontaneously to sinus rhythm after several hours.   Cardiology was notified for admission.  He does not smoke.  He drinks EtOH 2-3 beers when fishing, otherwise limited EtOH.  One coffee/day.  Past Medical History:  Diagnosis Date  . Bradycardia   . Carotid artery disease ([place])    Right CEA;  dopplers 6/12:  0-39% bilat ICA  . Coronary artery disease    s/p BMS to RCA 2002; [facility] 2008: oLAD 40%, pRCA stent ok with 20%, EF 60%); Myoview scan in March 2011 which was negative for ischemia   . GERD (gastroesophageal reflux disease)   . Hyperlipidemia   . Hypertension   . Myocardial infarction ([place])   . Obstructive sleep apnea     Past Surgical History:  Procedure Laterality Date  . CARDIAC CATHETERIZATION  06/01/2007    EF of 65% -- Nonobstructive CAD with 40% ostial stenosis in the LAD, no significant obstruction in the circumflex artery, 20% narrowing within the stent in the proximal to mid right coronary and normal LV function  . CARDIAC CATHETERIZATION  02/02/2006   EF of 50%  . CARDIAC CATHETERIZATION  01/19/2002   EF of 60%  . CARDIAC CATHETERIZATION N/A 12/03/2015   Procedure: Left Heart Cath and Coronary Angiography;  Surgeon: Burnell Blanks, MD;  Location: [facility];  Service: Cardiovascular;  Laterality: N/A;  . CAROTID ENDARTERECTOMY  06/26/2002   right  . CORONARY ANGIOPLASTY WITH STENT PLACEMENT  03/08/2000   Bare-metal stent in RCA, in [place]  . KNEE ARTHROSCOPY     right  . MOLE REMOVAL     Prior moles removed     Medications Prior to Admission: Prior to Admission medications   Medication Sig Start Date End Date Taking? Authorizing Provider  amLODipine (NORVASC) 10 MG tablet Take 1 tablet by mouth daily Patient taking differently: Take 10 mg by mouth daily.  03/04/18  Yes Lance Sell, NP  aspirin 81 MG tablet Take 81 mg by mouth daily.     Yes [provider]  b complex vitamins capsule Take 1 capsule by mouth daily.   Yes [provider]  clopidogrel (PLAVIX) 75 MG  tablet Take 75 mg by mouth daily.   Yes [provider]  doxazosin (CARDURA) 2 MG tablet Take 1 tablet (2 mg total) by mouth daily. 03/04/18  Yes Lance Sell, NP  ezetimibe (ZETIA) 10 MG tablet Take 1 tablet (10 mg total) by mouth daily. Annual appt due in July must see provider for future refills Patient taking differently: Take 10 mg by mouth daily.  03/04/18  Yes Lance Sell, NP  finasteride (PROSCAR) 5 MG tablet Take 5 mg by mouth daily.  08/06/17  Yes [provider]  hydrochlorothiazide (HYDRODIURIL) 50 MG tablet Take 0.5 tablets (25 mg total) by mouth daily. 03/04/18  Yes Lance Sell, NP  isosorbide mononitrate (IMDUR) 30 MG 24 hr tablet Take 1 tablet (30 mg total) by mouth daily. 03/04/18  Yes Lance Sell, NP  nitroGLYCERIN (NITROSTAT) 0.4 MG SL tablet Place 1 tablet (0.4 mg total) under the tongue every 5 (five) minutes as needed. Call 9-1-1 if need more than 2. 03/01/18  Yes Shambley, Delphia Grates, NP  pantoprazole (PROTONIX) 40 MG tablet Take 1 tablet by mouth every morning. Patient taking differently: Take 40 mg by mouth daily.  03/29/18  Yes Esterwood, Amy S, PA-C  potassium chloride (K-DUR,KLOR-CON) 10 MEQ tablet TAKE 1 TABLET DAILY Patient taking differently: Take 10 mEq by mouth at bedtime.  04/14/18  Yes Lance Sell, NP  ramipril (ALTACE) 10 MG capsule TAKE 1 CAPSULE TWICE A DAY Patient taking differently: Take 10 mg by mouth daily.  03/04/18  Yes Lance Sell, NP  rosuvastatin (CRESTOR) 40 MG tablet Take 1 tablet (40 mg total) by mouth daily. 03/04/18  Yes Lance Sell, NP  gabapentin (NEURONTIN) 300 MG capsule Take 1 tablet in the morning and 2 tablets at bedtime Patient not taking: Reported on 05/30/2018 04/01/18   Alda Berthold, DO     Allergies:    Allergies  Allergen Reactions  . Felodipine Swelling  . Niacin     REACTION: not tolerated.    Social History:   Social History   Socioeconomic History    . Marital status: Married    Spouse name: Not on file  . Number of children: 3  . Years of education: 38  . Highest education level: High school graduate  Occupational History  . Occupation: Barista  . Financial resource strain: Not on file  . Food insecurity:    Worry: Not on file    Inability: Not on file  . Transportation needs:    Medical: Not on file    Non-medical: Not on file  Tobacco Use  . Smoking status: Former Smoker    Packs/day: 1.00    Years: 30.00    Pack years: 30.00    Types:  Cigarettes    Last attempt to quit: 10/19/2000    Years since quitting: 17.6  . Smokeless tobacco: Current User    Types: Snuff  . Tobacco comment: Dips snuff.   Substance and Sexual Activity  . Alcohol use: Yes    Comment: Occasional beer  . Drug use: No  . Sexual activity: Not on file  Lifestyle  . Physical activity:    Days per week: Not on file    Minutes per session: Not on file  . Stress: Not on file  Relationships  . Social connections:    Talks on phone: Not on file    Gets together: Not on file    Attends religious service: Not on file    Active member of club or organization: Not on file    Attends meetings of clubs or organizations: Not on file    Relationship status: Not on file  . Intimate partner violence:    Fear of current or ex partner: Not on file    Emotionally abused: Not on file    Physically abused: Not on file    Forced sexual activity: Not on file  Other Topics Concern  . Not on file  Social History Narrative   Lives with wife in a one story home.  Has 3 children.  Works in Architect.  Education: high school.     Family History:   The patient's family history includes Heart disease in his father and mother; Heart failure in his father; Hypertension in his brother and brother; Peripheral vascular disease in his mother.    ROS:  Please see the history of present illness.  All other ROS reviewed and negative.     Physical  Exam/Data:   Vitals:   05/30/18 1615 05/30/18 1630 05/30/18 1645 05/30/18 1700  BP: 132/82 134/76 114/66 114/74  Pulse: 65 72 90 75  Resp: 17 14 17 15   Temp:      TempSrc:      SpO2: 99% 100% 95% 98%   VS:  BP 114/74   Pulse 75   Temp 98.4 F (36.9 C) (Oral)   Resp 15   SpO2 98%  , BMI There is no height or weight on file to calculate BMI. GENERAL:  Well appearing HEENT: Pupils equal round and reactive, fundi not visualized, oral mucosa unremarkable NECK:  No jugular venous distention, waveform within normal limits, carotid upstroke brisk and symmetric, no bruits LUNGS:  Clear to auscultation bilaterally HEART:  RRR.  PMI not displaced or sustained,S1 and S2 within normal limits, no S3, no S4, no clicks, no rubs, no murmurs ABD:  Flat, positive bowel sounds normal in frequency in pitch, no bruits, no rebound, no guarding, no midline pulsatile mass, no hepatomegaly, no splenomegaly EXT:  2 plus pulses throughout, no edema, no cyanosis no clubbing SKIN:  No rashes no nodules NEURO:  Cranial nerves II through XII grossly intact, motor grossly intact throughout PSYCH:  Cognitively intact, oriented to person place and time   EKG:  The ECG that was done 05/30/18 was personally reviewed and demonstrates atrial fibrillation.  Ventricular rate 131 bpm.  Low voltage limb leads.  Relevant CV Studies:  Lexiscan Myoview 05/06/18:  The left ventricular ejection fraction is normal (55-65%).  Nuclear stress EF: 56%.  There was no ST segment deviation noted during stress.  No T wave inversion was noted during stress.  The study is normal.  This is a low risk study.   Low risk stress nuclear  study with normal perfusion and normal left ventricular regional and global systolic function.  Carotid Doppler 05/06/18:  1-39% bilateral ICA stenosis.  48 hour Holter 05/03/18:  Sinus rhythm Nocturnal bradycardia. Lowest heart rate 36 bpm around 4 am.  Premature atrial contractions (773  during monitoring period) Several short bursts of supraventricular tachycardia lasting less than 3 seconds.  Rare premature ventricular contractions.   Would not restart beta blocker given nighttime bradycardia.   Laboratory Data:  Chemistry Recent Labs  Lab 05/30/18 1738  NA 139  K 3.7  CL 106  CO2 24  GLUCOSE 77  BUN 16  CREATININE 1.11  CALCIUM 9.2  GFRNONAA >60  GFRAA >60  ANIONGAP 9    Recent Labs  Lab 05/30/18 1738  PROT 6.4*  ALBUMIN 3.9  AST 30  ALT 44  ALKPHOS 49  BILITOT 1.4*   Hematology Recent Labs  Lab 05/30/18 1738  WBC 4.9  RBC 4.79  HGB 14.4  HCT 42.0  MCV 87.7  MCH 30.1  MCHC 34.3  RDW 11.9  PLT 172   Cardiac EnzymesNo results for input(s): TROPONINI in the last 168 hours.  Recent Labs  Lab 05/30/18 1708  TROPIPOC 0.00    BNPNo results for input(s): BNP, PROBNP in the last 168 hours.  DDimer No results for input(s): DDIMER in the last 168 hours.  Radiology/Studies:  Dg Chest Port 1 View  Result Date: 05/30/2018 CLINICAL DATA:  New onset atrial fibrillation. EXAM: PORTABLE CHEST 1 VIEW COMPARISON:  01/10/2018 FINDINGS: 1619 hours. The lungs are clear without focal pneumonia, edema, pneumothorax or pleural effusion. The cardiopericardial silhouette is within normal limits for size. The visualized bony structures of the thorax are intact. IMPRESSION: No active disease. Electronically Signed   By: Misty Stanley M.D.   On: 05/30/2018 16:46    Assessment and Plan:   # Paroxysmal atrial fibrillation: # Bradycardia: [name] spontaneously converted to sinus rhythm several hours after receiving 1 dose of IV metoprolol.  At baseline his heart rate is in the 60s.  He had bradycardia to the 40s while on low-dose atenolol.  I do not think that he is going to tolerate beta-blockade or other nodal agents.  He would likely benefit from an antiarrhythmic as he is symptomatic in atrial fibrillation.  We discussed the options of having a follow-up in  atrial fibrillation clinic versus seeing EP as an inpatient.  Given his CAD he is not a candidate for flecainide.  I suspect that they may want to start him on dofetilide.  We will asked them to see him in the morning.  He was properly started on Eliquis.  Thyroid function was normal 2 months ago.  He has not had a recent echocardiogram.  We will order one.  # Hypertension:  Blood pressure is well-controlled on his home regimen of amlodipine, hydrochlorthiazide, and ramipril.  # CAD s/p PCI: I suspect that the intermittent chest discomfort he has had is been due to atrial fibrillation.  He had a stress test last month that was negative for ischemia.  His last 2 calves have demonstrated patent stent and otherwise nonobstructive disease.  No plan for further ischemia evaluation at this time.  He was on aspirin and clopidogrel prior to admission.  We will reduce this to adjust aspirin given that he is now on Eliquis.  Continue rosuvastatin.  # Carotid stenosis: 1-39% bilaterally 04/2018.  Aspirin and statin as above.  Severity of Illness: The appropriate patient status for this patient  is OBSERVATION. Observation status is judged to be reasonable and necessary in order to provide the required intensity of service to ensure the patient's safety. The patient's presenting symptoms, physical exam findings, and initial radiographic and laboratory data in the context of their medical condition is felt to place them at decreased risk for further clinical deterioration. Furthermore, it is anticipated that the patient will be medically stable for discharge from the hospital within 2 midnights of admission. The following factors support the patient status of observation.   " The patient's presenting symptoms include atrial fibrillation with Rvr, shortness of breath. " The physical exam findings include . " The initial radiographic and laboratory data are .     For questions or updates, please contact [place] Please consult [email or web address] for contact info under Cardiology/STEMI.    Signed, Skeet Latch, MD  05/30/2018 7:05 PM

## 2018-05-30 NOTE — Telephone Encounter (Signed)
Pt reports lightheadedness x 4 weeks, increased frequency past weekend. States positional, occurs when "I first start out walking." Also reports mild SOB with exertion.  Reports BP yesterday.. 119/94, [id number].  This AM... 103/99  HR 67. Denies CP, states "Feeling weaker." Pt states "Something is just not right with me."  Pt seen 05/23/18 by L. Valere Dross 'Fatigue,sleep apnea'  Same day appt made with A. Shambley. Care advise given per protocol.  Reason for Disposition . [1] MODERATE dizziness (e.g., interferes with normal activities) AND [2] has NOT been evaluated by physician for this  (Exception: dizziness caused by heat exposure, sudden standing, or poor fluid intake)  Answer Assessment - Initial Assessment Questions 1. DESCRIPTION: "Describe your dizziness."     Lightheaded, weakness 2. LIGHTHEADED: "Do you feel lightheaded?" (e.g., somewhat faint, woozy, weak upon standing)     Yes 3. VERTIGO: "Do you feel like either you or the room is spinning or tilting?" (i.e. vertigo)     no 4. SEVERITY: "How bad is it?"  "Do you feel like you are going to faint?" "Can you stand and walk?"   - MILD - walking normally   - MODERATE - interferes with normal activities (e.g., work, school)    - SEVERE - unable to stand, requires support to walk, feels like passing out now.      Intermittent, moderate 5. ONSET:  "When did the dizziness begin?"     4-5 weeks ago 6. AGGRAVATING FACTORS: "Does anything make it worse?" (e.g., standing, change in head position)     Positional, "When I first start out walking." 7. HEART RATE: "Can you tell me your heart rate?" "How many beats in 15 seconds?"  (Note: not all patients can do this)       67 8. CAUSE: "What do you think is causing the dizziness?"     *Unsure 9. RECURRENT SYMPTOM: "Have you had dizziness before?" If so, ask: "When was the last time?" "What happened that time?"     Meds changed 10. OTHER SYMPTOMS: "Do you have any other symptoms?" (e.g., fever, chest  pain, vomiting, diarrhea, bleeding)       SOB, weakness  Protocols used: DIZZINESS Heidi Dach

## 2018-05-30 NOTE — ED Provider Notes (Signed)
[facility] EMERGENCY DEPARTMENT Provider Note   CSN: [id number] Arrival date & time: 05/30/18  1530     History   Chief Complaint No chief complaint on file.   HPI WENDEL HOMEYER is a 67 y.o. male.  HPI  67 yo male ho mi, cad, hyertension, hyperlipidemia, ho carotid stent, with 5 weeks of sob, weakness, occassional chest pain attributed to possible heart burn.  He states he has been feeling intermittently weak over the past 5 weeks.  Seen at primary care office today and noted to be in new onset atrial fibrillation sent to ED patient seen at [facility] cardiology on July 11 for preop colonoscopy.  At that time he was noted to be bradycardic in the 40s and his atenolol was stopped.  Saw md at [place],sent to cardiology and had monitor placed for one week 3 weeks ago and had symptoms but did not report any anbormalities.  Today felt extremely light headed yesterday and today.  Some dyspnea no pain.,  From last cardiology office visit Preoperative clearance before undergoing EGD/colonoscopy.  Dr. Angelena Form already cleared him to hold his Plavix for the procedure.  According to the revised cardiac risk index patient does not need further cardiac work-up before undergoing colonoscopy or endoscopy.  He has a high functional capacity.  He is bradycardic in the office today and I am stopping his atenolol.  We will check a Holter monitor to reassess this off beta-blocker. According to the Revised Cardiac Risk Index (RCRI), his Perioperative Risk of Major Cardiac Event is (%): 0.9  His Functional Capacity in METs is: 8.97 according to the Duke Activity Status Index (DASI).    CAD status post BMS to the RCA in 2002, last cath in 2017 patent RCA stents with diffuse nonobstructive CAD treated medically without angina.  Continue aspirin and Plavix.  He is not taking this with food which could be contributing to his indigestion.  Reiterated that he needs to take this with  food.  Essential hypertension blood pressure well controlled Past Medical History:  Diagnosis Date  . Bradycardia   . Carotid artery disease ([place])    Right CEA;  dopplers 6/12:  0-39% bilat ICA  . Coronary artery disease    s/p BMS to RCA 2002; [place] 2008: oLAD 40%, pRCA stent ok with 20%, EF 60%); Myoview scan in March 2011 which was negative for ischemia   . GERD (gastroesophageal reflux disease)   . Hyperlipidemia   . Hypertension   . Myocardial infarction ([place])   . Obstructive sleep apnea     Patient Active Problem List   Diagnosis Date Noted  . Preoperative clearance 04/26/2018  . Bradycardia 04/26/2018  . Dizziness 04/26/2018  . Hypersomnia with sleep apnea 12/05/2015  . Unstable angina ([place]) 12/02/2015  . Plantar fasciitis, bilateral 12/13/2012  . Carotid artery disease ([place]) 11/24/2010  . BRADYCARDIA 04/12/2009  . Coronary atherosclerosis 12/25/2008  . Obstructive sleep apnea 02/03/2008  . MYOCARDIAL INFARCTION 02/03/2008  . Hyperlipidemia 10/08/2007  . Essential hypertension 10/08/2007  . GASTROESOPHAGEAL REFLUX DISEASE 10/08/2007    Past Surgical History:  Procedure Laterality Date  . CARDIAC CATHETERIZATION  06/01/2007    EF of 65% -- Nonobstructive CAD with 40% ostial stenosis in the LAD, no significant obstruction in the circumflex artery, 20% narrowing within the stent in the proximal to mid right coronary and normal LV function  . CARDIAC CATHETERIZATION  02/02/2006   EF of 50%  . CARDIAC CATHETERIZATION  01/19/2002  EF of 60%  . CARDIAC CATHETERIZATION N/A 12/03/2015   Procedure: Left Heart Cath and Coronary Angiography;  Surgeon: Burnell Blanks, MD;  Location: [facility] CV LAB;  Service: Cardiovascular;  Laterality: N/A;  . CAROTID ENDARTERECTOMY  06/26/2002   right  . CORONARY ANGIOPLASTY WITH STENT PLACEMENT  03/08/2000   Bare-metal stent in RCA, in [place]  . KNEE ARTHROSCOPY     right  . MOLE REMOVAL     Prior moles removed         Home Medications    Prior to Admission medications   Medication Sig Start Date End Date Taking? Authorizing Provider  amLODipine (NORVASC) 10 MG tablet Take 1 tablet by mouth daily 03/04/18   Lance Sell, NP  aspirin 81 MG tablet Take 81 mg by mouth daily.      [provider]  b complex vitamins capsule Take 1 capsule by mouth daily.    [provider]  clopidogrel (PLAVIX) 75 MG tablet Take 75 mg by mouth daily.    [provider]  doxazosin (CARDURA) 2 MG tablet Take 1 tablet (2 mg total) by mouth daily. 03/04/18   Lance Sell, NP  ezetimibe (ZETIA) 10 MG tablet Take 1 tablet (10 mg total) by mouth daily. Annual appt due in July must see provider for future refills 03/04/18   Lance Sell, NP  finasteride (PROSCAR) 5 MG tablet  08/06/17   [provider]  gabapentin (NEURONTIN) 300 MG capsule Take 1 tablet in the morning and 2 tablets at bedtime Patient not taking: Reported on 05/30/2018 04/01/18   Narda Amber K, DO  hydrochlorothiazide (HYDRODIURIL) 50 MG tablet Take 0.5 tablets (25 mg total) by mouth daily. 03/04/18   Lance Sell, NP  isosorbide mononitrate (IMDUR) 30 MG 24 hr tablet Take 1 tablet (30 mg total) by mouth daily. 03/04/18   Lance Sell, NP  nitroGLYCERIN (NITROSTAT) 0.4 MG SL tablet Place 1 tablet (0.4 mg total) under the tongue every 5 (five) minutes as needed. Call 9-1-1 if need more than 2. 03/01/18   Lance Sell, NP  pantoprazole (PROTONIX) 40 MG tablet Take 1 tablet by mouth every morning. 03/29/18   Esterwood, Amy S, PA-C  potassium chloride (K-DUR,KLOR-CON) 10 MEQ tablet TAKE 1 TABLET DAILY 04/14/18   Lance Sell, NP  ramipril (ALTACE) 10 MG capsule TAKE 1 CAPSULE TWICE A DAY 03/04/18   Lance Sell, NP  rosuvastatin (CRESTOR) 40 MG tablet Take 1 tablet (40 mg total) by mouth daily. 03/04/18   Lance Sell, NP    Family History Family History  Problem  Relation Age of Onset  . Heart failure Father   . Heart disease Father   . Peripheral vascular disease Mother        with pacemaker, and had a carotid endarterectomy  . Heart disease Mother   . Hypertension Brother   . Hypertension Brother     Social History Social History   Tobacco Use  . Smoking status: Former Smoker    Packs/day: 1.00    Years: 30.00    Pack years: 30.00    Types: Cigarettes    Last attempt to quit: 10/19/2000    Years since quitting: 17.6  . Smokeless tobacco: Current User    Types: Snuff  . Tobacco comment: Dips snuff.   Substance Use Topics  . Alcohol use: Yes    Comment: Occasional beer  . Drug use: No  Allergies   Felodipine and Niacin   Review of Systems Review of Systems  Constitutional: Positive for appetite change.  HENT: Negative.   Eyes: Negative.   Respiratory: Positive for shortness of breath. Negative for cough.   Cardiovascular: Positive for palpitations.  Gastrointestinal: Negative.   Endocrine: Negative.   Genitourinary: Positive for frequency.  Musculoskeletal: Negative.   Skin: Negative.   Allergic/Immunologic: Negative.   Neurological: Positive for light-headedness.  Hematological: Negative.   Psychiatric/Behavioral: Negative.   All other systems reviewed and are negative.    Physical Exam Updated Vital Signs There were no vitals taken for this visit.  Physical Exam  Constitutional: He is oriented to person, place, and time. He appears well-developed and well-nourished.  HENT:  Head: Normocephalic and atraumatic.  Right Ear: External ear normal.  Left Ear: External ear normal.  Mouth/Throat: Oropharynx is clear and moist.  Eyes: Pupils are equal, round, and reactive to light. EOM are normal.  Neck: Normal range of motion.  Cardiovascular: An irregularly irregular rhythm present. Tachycardia present.  Pulmonary/Chest: Effort normal and breath sounds normal.  Abdominal: Soft.  Musculoskeletal: Normal range  of motion. He exhibits no edema, tenderness or deformity.  Neurological: He is alert and oriented to person, place, and time.  Skin: Skin is warm and dry. Capillary refill takes less than 2 seconds.  Psychiatric: He has a normal mood and affect.  Nursing note and vitals reviewed.    ED Treatments / Results  Labs (all labs ordered are listed, but only abnormal results are displayed) Labs Reviewed - No data to display  EKG None  Radiology No results found.  Procedures Procedures (including critical care time)  Medications Ordered in ED Medications - No data to display   Initial Impression / Assessment and Plan / ED Course  I have reviewed the triage vital signs and the nursing notes.  Pertinent labs & imaging results that were available during my care of the patient were reviewed by me and considered in my medical decision making (see chart for details).    Presents today with generalized weakness and new onset atrial fibrillation with rapid ventricular response.  It is unclear exactly when this started as he does report symptoms over the past several weeks intermittently.  Has recently he had a beta-blocker stopped due to cardiac.  He will be a dose of IV Lopressor here and cardiology to be consulted.  Is currently not anticoagulated will start anticoagulation here in ED. New onset atrial fibrillation with RVR unclear duration.  Discussed with Dr. Debara Pickett.  Cardiology will see in consult.  Patient anticoagulated here with Eliquis.  Lopressor given and heart rate is only 100.  Pressure has remained stable.  Labs reviewed with normal CBC, INR, and troponin.   Given patient's unknown time of onset, he is not a candidate for cardioversion here in ED.  His rate has been controlled with IV beta-blocker but he will need ongoing rate control. Final Clinical Impressions(s) / ED Diagnoses   Final diagnoses:  Atrial fibrillation with RVR [facility])    ED Discharge Orders    None        Pattricia Boss, MD 05/30/18 1944

## 2018-05-30 NOTE — ED Triage Notes (Signed)
Pt to ED via [facility] he went to his MD's office today ref. Just feeling tired and having shortness of breath off and on for past 5 weeks.  Pt had a EKG at MD's office and found pt to be in A-Fib.  Pt has no hx of same.  Pt alert and oriented with no complaints of chest pain at this time

## 2018-05-30 NOTE — Progress Notes (Signed)
Name: [name]   MRN: [id number]    DOB: [birth year]   Date:05/30/2018       Progress Note  Subjective  Chief Complaint  Chief Complaint  Patient presents with  . Fatigue    having weakness, light headedness, fatigue, getting worse, states he wore a heart monitor and was told his heart rate would get in the 30s at night and skip    HPI  Mr [name] is here today for repeated evaluation of weakness, lightheadedness, fatigue, which has been progressively worsening over the past several months. He was seen for cardiac evaluation in July for his symptoms and wore a holter monitor which showed some bradycardia at nighttime,  thought to be related to his atenolol, and his atenolol was discontinued along with additional testing done which was unremarkable. He tells me today He has continued to experience weakness, lightheadedness, fatigue since his cardiac evaluation, recent CBC, CMET, vitamin D level were normal. A referral was placed last week for updated sleep study and his neurontin was stopped to see If that was contributing to his fatigue, he says today that he has stopped neurontin as instructed but continues to feel fatigued and has even had to reduce work hours due to symptoms. He denies syncope, confusion, slurred speech, chest pain, sob, palpitations, nausea, vomiting.    Patient Active Problem List   Diagnosis Date Noted  . Preoperative clearance 04/26/2018  . Bradycardia 04/26/2018  . Dizziness 04/26/2018  . Hypersomnia with sleep apnea 12/05/2015  . Unstable angina ([place]) 12/02/2015  . Plantar fasciitis, bilateral 12/13/2012  . Carotid artery disease ([place]) 11/24/2010  . BRADYCARDIA 04/12/2009  . Coronary atherosclerosis 12/25/2008  . Obstructive sleep apnea 02/03/2008  . MYOCARDIAL INFARCTION 02/03/2008  . Hyperlipidemia 10/08/2007  . Essential hypertension 10/08/2007  . GASTROESOPHAGEAL REFLUX DISEASE 10/08/2007    Past Surgical History:  Procedure Laterality Date  .  CARDIAC CATHETERIZATION  06/01/2007    EF of 65% -- Nonobstructive CAD with 40% ostial stenosis in the LAD, no significant obstruction in the circumflex artery, 20% narrowing within the stent in the proximal to mid right coronary and normal LV function  . CARDIAC CATHETERIZATION  02/02/2006   EF of 50%  . CARDIAC CATHETERIZATION  01/19/2002   EF of 60%  . CARDIAC CATHETERIZATION N/A 12/03/2015   Procedure: Left Heart Cath and Coronary Angiography;  Surgeon: Burnell Blanks, MD;  Location: [facility] CV LAB;  Service: Cardiovascular;  Laterality: N/A;  . CAROTID ENDARTERECTOMY  06/26/2002   right  . CORONARY ANGIOPLASTY WITH STENT PLACEMENT  03/08/2000   Bare-metal stent in RCA, in [place]  . KNEE ARTHROSCOPY     right  . MOLE REMOVAL     Prior moles removed    Family History  Problem Relation Age of Onset  . Heart failure Father   . Heart disease Father   . Peripheral vascular disease Mother        with pacemaker, and had a carotid endarterectomy  . Heart disease Mother   . Hypertension Brother   . Hypertension Brother     Social History   Socioeconomic History  . Marital status: Married    Spouse name: Not on file  . Number of children: 3  . Years of education: 70  . Highest education level: High school graduate  Occupational History  . Occupation: Barista  . Financial resource strain: Not on file  . Food insecurity:    Worry: Not on file  Inability: Not on file  . Transportation needs:    Medical: Not on file    Non-medical: Not on file  Tobacco Use  . Smoking status: Former Smoker    Packs/day: 1.00    Years: 30.00    Pack years: 30.00    Types: Cigarettes    Last attempt to quit: 10/19/2000    Years since quitting: 17.6  . Smokeless tobacco: Current User    Types: Snuff  . Tobacco comment: Dips snuff.   Substance and Sexual Activity  . Alcohol use: Yes    Comment: Occasional beer  . Drug use: No  . Sexual activity: Not on  file  Lifestyle  . Physical activity:    Days per week: Not on file    Minutes per session: Not on file  . Stress: Not on file  Relationships  . Social connections:    Talks on phone: Not on file    Gets together: Not on file    Attends religious service: Not on file    Active member of club or organization: Not on file    Attends meetings of clubs or organizations: Not on file    Relationship status: Not on file  . Intimate partner violence:    Fear of current or ex partner: Not on file    Emotionally abused: Not on file    Physically abused: Not on file    Forced sexual activity: Not on file  Other Topics Concern  . Not on file  Social History Narrative   Lives with wife in a one story home.  Has 3 children.  Works in Architect.  Education: high school.      Current Outpatient Medications:  .  amLODipine (NORVASC) 10 MG tablet, Take 1 tablet by mouth daily, Disp: 90 tablet, Rfl: 1 .  aspirin 81 MG tablet, Take 81 mg by mouth daily.  , Disp: , Rfl:  .  b complex vitamins capsule, Take 1 capsule by mouth daily., Disp: , Rfl:  .  clopidogrel (PLAVIX) 75 MG tablet, Take 75 mg by mouth daily., Disp: , Rfl:  .  doxazosin (CARDURA) 2 MG tablet, Take 1 tablet (2 mg total) by mouth daily., Disp: 180 tablet, Rfl: 1 .  ezetimibe (ZETIA) 10 MG tablet, Take 1 tablet (10 mg total) by mouth daily. Annual appt due in July must see provider for future refills, Disp: 90 tablet, Rfl: 1 .  finasteride (PROSCAR) 5 MG tablet, , Disp: , Rfl:  .  hydrochlorothiazide (HYDRODIURIL) 50 MG tablet, Take 0.5 tablets (25 mg total) by mouth daily., Disp: 45 tablet, Rfl: 1 .  isosorbide mononitrate (IMDUR) 30 MG 24 hr tablet, Take 1 tablet (30 mg total) by mouth daily., Disp: 90 tablet, Rfl: 1 .  nitroGLYCERIN (NITROSTAT) 0.4 MG SL tablet, Place 1 tablet (0.4 mg total) under the tongue every 5 (five) minutes as needed. Call 9-1-1 if need more than 2., Disp: 25 tablet, Rfl: 0 .  pantoprazole (PROTONIX) 40 MG  tablet, Take 1 tablet by mouth every morning., Disp: 90 tablet, Rfl: 3 .  potassium chloride (K-DUR,KLOR-CON) 10 MEQ tablet, TAKE 1 TABLET DAILY, Disp: 90 tablet, Rfl: 0 .  ramipril (ALTACE) 10 MG capsule, TAKE 1 CAPSULE TWICE A DAY, Disp: 180 capsule, Rfl: 1 .  rosuvastatin (CRESTOR) 40 MG tablet, Take 1 tablet (40 mg total) by mouth daily., Disp: 90 tablet, Rfl: 1 .  gabapentin (NEURONTIN) 300 MG capsule, Take 1 tablet in the morning and 2 tablets  at bedtime (Patient not taking: Reported on 05/30/2018), Disp: 90 capsule, Rfl: 11  Allergies  Allergen Reactions  . Felodipine Swelling  . Niacin     REACTION: not tolerated.     ROS See HPI  Objective  Vitals:   05/30/18 1405  BP: 118/62  Pulse: 78  Resp: 16  Temp: 97.8 F (36.6 C)  TempSrc: Oral  SpO2: 98%  Weight: 191 lb (86.6 kg)  Height: 5\' 9"  (1.753 m)    Body mass index is 28.21 kg/m.  Physical Exam Vital signs reviewed. Constitutional: Patient appears well-developed and well-nourished. No distress.  HENT: Head: Normocephalic and atraumatic. Nose: Nose normal. Mouth/Throat: Oropharynx is clear and moist. No oropharyngeal exudate.  Eyes: Conjunctivae and EOM are normal. Pupils are equal, round, and reactive to light. No scleral icterus.  Neck: Normal range of motion. Neck supple. Cardiovascular: normal heart rate, irregularly irregular rhythm. No BLE edema. Distal pulses intact. Pulmonary/Chest: Effort normal and breath sounds normal. No respiratory distress. Neurological: He is alert and oriented to person, place, and time. No cranial nerve deficit. Coordination, balance, strength, speech and gait are normal.  Skin: Skin is warm and dry. No rash noted. No erythema.  Psychiatric: Patient has a normal mood and affect. behavior is normal. Judgment and thought content normal.    Assessment & Plan  Other fatigue, Irregular heart beat Irregular heart beat auscultated on PE today, EKG repeated showing atrial  fibrillation which Is new for him. Suspect this is contributing to his complaints of weakness and fatigue, and I wonder if he may have been having intermittent episodes of atrial fibrillation over the past several months. EMS called to transport patient urgently to hospital for further evaluation, patient is agreeabl - EKG 12-Lead-I have personally reviewed the EKG tracing and agree with computerized printout as noted: atrial fibrillation, abnormal EKG, changes noted from 05/03/18 EKG which read normal sinus rhythm.

## 2018-05-31 ENCOUNTER — Observation Stay (HOSPITAL_BASED_OUTPATIENT_CLINIC_OR_DEPARTMENT_OTHER): Payer: Managed Care, Other (non HMO)

## 2018-05-31 DIAGNOSIS — I480 Paroxysmal atrial fibrillation: Secondary | ICD-10-CM | POA: Diagnosis not present

## 2018-05-31 DIAGNOSIS — I10 Essential (primary) hypertension: Secondary | ICD-10-CM | POA: Diagnosis not present

## 2018-05-31 DIAGNOSIS — I4891 Unspecified atrial fibrillation: Secondary | ICD-10-CM

## 2018-05-31 LAB — BASIC METABOLIC PANEL
Anion gap: 8 (ref 5–15)
BUN: 14 mg/dL (ref 8–23)
CO2: 28 mmol/L (ref 22–32)
Calcium: 9.3 mg/dL (ref 8.9–10.3)
Chloride: 106 mmol/L (ref 98–111)
Creatinine, Ser: 1.29 mg/dL — ABNORMAL HIGH (ref 0.61–1.24)
GFR calc Af Amer: >60 mL/min (ref >60)
GFR calc non Af Amer: 57 mL/min — ABNORMAL LOW (ref >60)
Glucose, Bld: 114 mg/dL — ABNORMAL HIGH (ref 70–99)
Potassium: 3.9 mmol/L (ref 3.5–5.1)
Sodium: 142 mmol/L (ref 135–145)

## 2018-05-31 LAB — LIPID PANEL
Cholesterol: 114 mg/dL (ref 0–200)
HDL: 30 mg/dL — ABNORMAL LOW (ref >40)
LDL Cholesterol: 62 mg/dL (ref 0–99)
Total CHOL/HDL Ratio: 3.8 RATIO
Triglycerides: 108 mg/dL (ref <150)
VLDL: 22 mg/dL (ref 0–40)

## 2018-05-31 LAB — ECHOCARDIOGRAM COMPLETE
Height: 73 in
Weight: 3104 oz

## 2018-05-31 LAB — HIV ANTIBODY (ROUTINE TESTING W REFLEX): HIV Screen 4th Generation wRfx: NONREACTIVE

## 2018-05-31 MED ORDER — APIXABAN 5 MG PO TABS: 5.0000 mg | ORAL_TABLET | Freq: Two times a day (BID) | ORAL | 11 refills | Status: DC

## 2018-05-31 MED ORDER — RAMIPRIL 2.5 MG PO CAPS
10.0000 mg | ORAL_CAPSULE | Freq: Two times a day (BID) | ORAL | Status: DC
  Administered 2018-05-31 (×2): 10 mg via ORAL
  Filled 2018-05-31 (×2): qty 4

## 2018-05-31 MED ORDER — METOPROLOL TARTRATE 25 MG PO TABS: 25.0000 mg | ORAL_TABLET | Freq: Every day | ORAL | 11 refills | Status: DC | PRN

## 2018-05-31 NOTE — Discharge Summary (Signed)
Discharge Summary    Patient ID: [name],  MRN: [id number], DOB/AGE: [birth year] 67 y.o.  Admit date: 05/30/2018 Discharge date: 05/31/2018  Primary Care Provider: Lance Sell Primary Cardiologist: Lauree Chandler, MD  Discharge Diagnoses    Active Problems:   Atrial fibrillation [facility])  Allergies Allergies  Allergen Reactions  . Felodipine Swelling  . Niacin     REACTION: not tolerated.    Diagnostic Studies/Procedures    Echocardiogram 05/31/2018 Study Conclusions - Left ventricle: The cavity size was normal. There was mild   concentric hypertrophy. Systolic function was normal. The   estimated ejection fraction was in the range of 60% to 65%. Wall   motion was normal; there were no regional wall motion   abnormalities. Features are consistent with a pseudonormal left   ventricular filling pattern, with concomitant abnormal relaxation   and increased filling pressure (grade 2 diastolic dysfunction). - Aortic valve: Trileaflet; mildly thickened, mildly calcified   leaflets. - Left atrium: The atrium was mildly dilated. - Tricuspid valve: There was trivial regurgitation. - Pulmonic valve: There was mild regurgitation. _____________   History of Present Illness     [name] is a 67 y.o. male with a history of CAD s/p PCI, hypertension, hyperlipidemia, OSA, carotid stenosis s/p CEA, and GERD here with fatigue and shortness of breath and found to have new onset atrial fibrillation with rapid ventricular response.  [name] had a BMS implanted in the RCA in 2002.  Since that time he had a LHC in 2008 that showed his stent was patent and he had otherwise nonobstructive disease.  LHC 11/2015 was similar.    He had a Lexiscan Myoview in 2011 that was negative for ischemia.  He was seen for preoperative clearance prior to EGD/colonoscopy and was noted to be bradycardic to the 40s 04/2018.  At the time he was on atenolol 25mg  which was discontinued.  On 48 hour  Holter he was noted to be bradcyardic to the 40s overnight as well as 3 second runs of SVT.  He has known OSA and is compliant with CPAP.  Since that time he continued to be very fatigued.  He is typically active and has been on FMLA from work because he cannot walk short distances without palpitations and shortness of breath.   He also noted intermittent L sided chest discomfort with radiation to the L shoulder.  He checks his HR and BP at home and his monitor sometimes reports and irregular heart rhythm. Repeat Lexiscan Myoview 05/06/18 was negative for ischemia.  LVEF was 56%.   He does not smoke.  He drinks EtOH 2-3 beers when fishing, otherwise limited EtOH.  One coffee/day.  For the two days prior to presentation his symptoms had been more constant and intense.    Hospital Course     Consultants: none  On presentation his heart rate was initially in the 130s.  This improved to less than 100 after 1 dose of IV metoprolol.  He was started on and Eliquis for anticoagulation.  He converted spontaneously to sinus rhythm after several hours.    Given his history of CAD he is not a candidate for flecainide.  EP was called to see the patient for input on anti-arrhythmia therapy with the following findings:  Likely intermittent afib for the last 5 weeks and then persistent over the last few days. He converted to SR with 1 dose of IV metoprolol.  He has had bradycardia in  the past with scheduled Atenolol.  Options discussed with  pt including prn BB therapy vs AAD therapy (Tikosyn only real option).  He would like to avoid AADs which is reasonable. Will send home on Metoprolol 25mg  prn for palpitations.  Follow up arranged with AF clinic next week - he is aware. Continue Eliquis for CHADS2VASC of at least 3. He will need CBC, BMET next week at follow up. EP also discussed risk factor modification for AF today. He was encouraged regular aerobic exercise (walking 43minutes per day, 5 days per week), compliance  with CPAP and decreasing ETOH consumption (he currently drinks a 6 pack of beer/week, advised no more than 2 drinks per week).   His last 2 caths have demonstrated a patent stent and otherwise nonobstructive disease. No plan for further ischemia evaluation at this time. He was on aspirin and clopidogrel prior to admission. We will reduce this to just aspirin given that he is now on Eliquis. Continue rosuvastatin.    _____________  Discharge Vitals Blood pressure 102/66, pulse 64, temperature 98.2 F (36.8 C), temperature source Oral, resp. rate 18, height 6\' 1"  (1.854 m), weight 88 kg, SpO2 95 %.  Filed Weights   05/30/18 2013 05/31/18 0559  Weight: 89.2 kg 88 kg    Labs & Radiologic Studies    CBC Recent Labs    05/30/18 1738  WBC 4.9  NEUTROABS 3.3  HGB 14.4  HCT 42.0  MCV 87.7  PLT 119   Basic Metabolic Panel Recent Labs    05/30/18 1738 05/31/18 0539  NA 139 142  K 3.7 3.9  CL 106 106  CO2 24 28  GLUCOSE 77 114*  BUN 16 14  CREATININE 1.11 1.29*  CALCIUM 9.2 9.3  MG 2.0  --    Liver Function Tests Recent Labs    05/30/18 1738  AST 30  ALT 44  ALKPHOS 49  BILITOT 1.4*  PROT 6.4*  ALBUMIN 3.9   No results for input(s): LIPASE, AMYLASE in the last 72 hours. Cardiac Enzymes No results for input(s): CKTOTAL, CKMB, CKMBINDEX, TROPONINI in the last 72 hours. BNP Invalid input(s): POCBNP D-Dimer No results for input(s): DDIMER in the last 72 hours. Hemoglobin A1C No results for input(s): HGBA1C in the last 72 hours. Fasting Lipid Panel Recent Labs    05/31/18 0539  CHOL 114  HDL 30*  LDLCALC 62  TRIG 108  CHOLHDL 3.8   Thyroid Function Tests No results for input(s): TSH, T4TOTAL, T3FREE, THYROIDAB in the last 72 hours.  Invalid input(s): FREET3 _____________  Dg Chest Port 1 View  Result Date: 05/30/2018 CLINICAL DATA:  New onset atrial fibrillation. EXAM: PORTABLE CHEST 1 VIEW COMPARISON:  01/10/2018 FINDINGS: 1619 hours. The lungs are  clear without focal pneumonia, edema, pneumothorax or pleural effusion. The cardiopericardial silhouette is within normal limits for size. The visualized bony structures of the thorax are intact. IMPRESSION: No active disease. Electronically Signed   By: Misty Stanley M.D.   On: 05/30/2018 16:46   Disposition   Pt is being discharged home today in good condition.  Follow-up Plans & Appointments    Follow-up Information    [facility] on 06/08/2018.   Specialty:  Cardiology Why:  at 10:30AM Contact information: [address] [id number] [place] [phone]         Discharge Instructions    Amb referral to AFIB Clinic   Complete by:  As directed  Diet - low sodium heart healthy   Complete by:  As directed    Increase activity slowly   Complete by:  As directed       Discharge Medications   Allergies as of 05/31/2018      Reactions   Felodipine Swelling   Niacin    REACTION: not tolerated.      Medication List    STOP taking these medications   clopidogrel 75 MG tablet Commonly known as:  PLAVIX   gabapentin 300 MG capsule Commonly known as:  NEURONTIN     TAKE these medications   amLODipine 10 MG tablet Commonly known as:  NORVASC Take 1 tablet by mouth daily What changed:    how much to take  how to take this  when to take this  additional instructions   apixaban 5 MG Tabs tablet Commonly known as:  ELIQUIS Take 1 tablet (5 mg total) by mouth 2 (two) times daily.   aspirin 81 MG tablet Take 81 mg by mouth daily.   b complex vitamins capsule Take 1 capsule by mouth daily.   doxazosin 2 MG tablet Commonly known as:  CARDURA Take 1 tablet (2 mg total) by mouth daily.   ezetimibe 10 MG tablet Commonly known as:  ZETIA Take 1 tablet (10 mg total) by mouth daily. Annual appt due in July must see provider for future refills What changed:  additional instructions     finasteride 5 MG tablet Commonly known as:  PROSCAR Take 5 mg by mouth daily.   hydrochlorothiazide 50 MG tablet Commonly known as:  HYDRODIURIL Take 0.5 tablets (25 mg total) by mouth daily.   isosorbide mononitrate 30 MG 24 hr tablet Commonly known as:  IMDUR Take 1 tablet (30 mg total) by mouth daily.   metoprolol tartrate 25 MG tablet Commonly known as:  LOPRESSOR Take 1 tablet (25 mg total) by mouth daily as needed (as needed for palpitations/afib.).   nitroGLYCERIN 0.4 MG SL tablet Commonly known as:  NITROSTAT Place 1 tablet (0.4 mg total) under the tongue every 5 (five) minutes as needed. Call 9-1-1 if need more than 2.   pantoprazole 40 MG tablet Commonly known as:  PROTONIX Take 1 tablet by mouth every morning. What changed:    how much to take  how to take this  when to take this  additional instructions   potassium chloride 10 MEQ tablet Commonly known as:  K-DUR,KLOR-CON TAKE 1 TABLET DAILY What changed:  when to take this   ramipril 10 MG capsule Commonly known as:  ALTACE TAKE 1 CAPSULE TWICE A DAY What changed:    how much to take  how to take this  when to take this  additional instructions   rosuvastatin 40 MG tablet Commonly known as:  CRESTOR Take 1 tablet (40 mg total) by mouth daily.        Acute coronary syndrome (MI, NSTEMI, STEMI, etc) this admission?: No.    Outstanding Labs/Studies   CBC and BMet at follow up.    Duration of Discharge Encounter   Greater than 30 minutes including physician time.  Signed, Daune Perch, NP 05/31/2018, 6:17 PM

## 2018-05-31 NOTE — Progress Notes (Addendum)
Benefit check in progress for EliquisAneta Mins [phone]  3:34 pm - Eliquis coupon card given to patient with explanation of usage; B Pennie Rushing

## 2018-05-31 NOTE — Progress Notes (Signed)
Progress Note  Patient Name: [name] Date of Encounter: 05/31/2018  Primary Cardiologist: Lauree Chandler, MD   Subjective   Feeling well.  No recurrent palpitations.  Energy level is much better.   Inpatient Medications    Scheduled Meds: . amLODipine  10 mg Oral Daily  . apixaban  5 mg Oral BID  . aspirin EC  81 mg Oral Daily  . doxazosin  2 mg Oral Daily  . ezetimibe  10 mg Oral Daily  . finasteride  5 mg Oral Daily  . hydrochlorothiazide  25 mg Oral Daily  . isosorbide mononitrate  30 mg Oral Daily  . pantoprazole  40 mg Oral Daily  . ramipril  10 mg Oral BID  . rosuvastatin  40 mg Oral q1800   Continuous Infusions:  PRN Meds: acetaminophen, metoprolol tartrate, ondansetron (ZOFRAN) IV   Vital Signs    Vitals:   05/30/18 2306 05/31/18 0036 05/31/18 0559 05/31/18 0816  BP:  121/71 101/70 119/77  Pulse:  71 63 74  Resp:  18 19   Temp:  97.8 F (36.6 C) 97.6 F (36.4 C)   TempSrc:  Oral Oral   SpO2: 98% 97% 95%   Weight:   88 kg   Height:        Intake/Output Summary (Last 24 hours) at 05/31/2018 1007 Last data filed at 05/31/2018 0926 Gross per 24 hour  Intake 720 ml  Output 100 ml  Net 620 ml   Filed Weights   05/30/18 2013 05/31/18 0559  Weight: 89.2 kg 88 kg    Telemetry    Sinus rhythm, sinus bradycardia.  - Personally Reviewed  ECG    05/30/2018: Atrial fibrillation.  Rate 114 bpm.  Nonspecific ST/T changes.- Personally Reviewed  Physical Exam   VS:  BP 119/77   Pulse 74   Temp 97.6 F (36.4 C) (Oral)   Resp 19   Ht 6\' 1"  (1.854 m)   Wt 88 kg Comment: scale c  SpO2 95%   BMI 25.60 kg/m  , BMI Body mass index is 25.6 kg/m. GENERAL:  Well appearing.  No acute distress. HEENT: Pupils equal round and reactive, fundi not visualized, oral mucosa unremarkable NECK:  No jugular venous distention, waveform within normal limits, carotid upstroke brisk and symmetric, no bruits LUNGS:  Clear to auscultation bilaterally HEART:   RRR.  PMI not displaced or sustained,S1 and S2 within normal limits, no S3, no S4, no clicks, no rubs, no murmurs ABD:  Flat, positive bowel sounds normal in frequency in pitch, no bruits, no rebound, no guarding, no midline pulsatile mass, no hepatomegaly, no splenomegaly EXT:  2 plus pulses throughout, no edema, no cyanosis no clubbing SKIN:  No rashes no nodules NEURO:  Cranial nerves II through XII grossly intact, motor grossly intact throughout [facility]:  Cognitively intact, oriented to person place and time   Labs    Chemistry Recent Labs  Lab 05/30/18 1738 05/31/18 0539  NA 139 142  K 3.7 3.9  CL 106 106  CO2 24 28  GLUCOSE 77 114*  BUN 16 14  CREATININE 1.11 1.29*  CALCIUM 9.2 9.3  PROT 6.4*  --   ALBUMIN 3.9  --   AST 30  --   ALT 44  --   ALKPHOS 49  --   BILITOT 1.4*  --   GFRNONAA >60 57*  GFRAA >60 >60  ANIONGAP 9 8     Hematology Recent Labs  Lab 05/30/18 1738  WBC 4.9  RBC 4.79  HGB 14.4  HCT 42.0  MCV 87.7  MCH 30.1  MCHC 34.3  RDW 11.9  PLT 172    Cardiac EnzymesNo results for input(s): TROPONINI in the last 168 hours.  Recent Labs  Lab 05/30/18 1708  TROPIPOC 0.00     BNPNo results for input(s): BNP, PROBNP in the last 168 hours.   DDimer No results for input(s): DDIMER in the last 168 hours.   Radiology    Dg Chest Port 1 View  Result Date: 05/30/2018 CLINICAL DATA:  New onset atrial fibrillation. EXAM: PORTABLE CHEST 1 VIEW COMPARISON:  01/10/2018 FINDINGS: 1619 hours. The lungs are clear without focal pneumonia, edema, pneumothorax or pleural effusion. The cardiopericardial silhouette is within normal limits for size. The visualized bony structures of the thorax are intact. IMPRESSION: No active disease. Electronically Signed   By: Misty Stanley M.D.   On: 05/30/2018 16:46    Cardiac Studies   Lexiscan Myoview 05/06/18:  The left ventricular ejection fraction is normal (55-65%).  Nuclear stress EF: 56%.  There was no ST  segment deviation noted during stress.  No T wave inversion was noted during stress.  The study is normal.  This is a low risk study.   Low risk stress nuclear study with normal perfusion and normal left ventricular regional and global systolic function.  Patient Profile     [name] is a 67 y.o. male with a history of CAD s/p PCI, hypertension, hyperlipidemia, OSA, carotid stenosis s/p CEA, and GERD here with fatigue and shortness of breath and found to have new onset atrial fibrillation with rapid ventricular response.  Assessment & Plan    # Paroxysmal atrial fibrillation: # Bradycardia: Mr. Hufnagle spontaneously converted to sinus rhythm several hours after receiving 1 dose of IV metoprolol.  At baseline his heart rate is in the 60s.  He had bradycardia to the 40s while on low-dose atenolol.  I do not think that he is going to tolerate beta-blockade or other nodal agents.  He would likely benefit from an antiarrhythmic as he is symptomatic in atrial fibrillation.  We discussed the options of having a follow-up in atrial fibrillation clinic versus seeing EP as an inpatient.    EP will see him today to determine if he is a candidate for antiarrhythmics versus a trial of low-dose nodal agents.  Given his coronary artery disease history he is not a candidate for flecainide.   # Hypertension:  Blood pressure is well-controlled on his home regimen of amlodipine, hydrochlorthiazide, and ramipril.  # CAD s/p PCI: I suspect that the intermittent chest discomfort he has had is been due to atrial fibrillation.  He had a stress test last month that was negative for ischemia.  His last 2 caths have demonstrated a patent stent and otherwise nonobstructive disease.  No plan for further ischemia evaluation at this time.  He was on aspirin and clopidogrel prior to admission.  We will reduce this to adjust aspirin given that he is now on Eliquis.  Continue rosuvastatin.  # Carotid stenosis: 1-39%  bilaterally 04/2018.  Aspirin and statin as above.    For questions or updates, please contact [place] Please consult [email or web address] for contact info under Cardiology/STEMI.      Signed, Skeet Latch, MD  05/31/2018, 10:07 AM

## 2018-05-31 NOTE — Care Management (Signed)
05-31-18 BENEFITS CHECK      # 2.   S/W  ROD W. @ [facility] #  [phone]  ELIQUIS  5 MG BID COVER- YES CO-PAY- $ 175.00 TIER-NO PRIOR APPROVAL- NO  NO DEDUCTIBLE /  40% FLAT CO-PAY-  PREFERRED PHARMACY ; YES [facility] , [facility], [facility] AND  [facility]

## 2018-05-31 NOTE — Consult Note (Addendum)
ELECTROPHYSIOLOGY CONSULT NOTE    Patient ID: [name] MRN: [id number], DOB/AGE: 07-04-1951 67 y.o.  Admit date: 05/30/2018 Date of Consult: 05/31/2018  Primary Physician: Lance Sell, NP Primary Cardiologist: Angelena Form Electrophysiologist: Lovena Le (new this admission)  Patient Profile: [name] is a 67 y.o. male with a history of CAD, hypertension, OSA on CPAP, carotid stenosis s/p CEA, and GERD who is being seen today for the evaluation of AF at the request of Dr Oval Linsey.  HPI:  [name] is a 67 y.o. male with the above past medical history. He reports that for the last 5 weeks he has felt poorly with increased exercise intolerance, fatigue, and shortness of breath with exertion. He has had "good days and bad days".  On the day of admission, he saw his PCP and was found to be in new onset atrial fibrillation and referred to [facility] for further evaluation. He was given a dose of IV metoprolol and converted to SR with significant improvement in symptoms. He has been started on Eliquis. He has previously documented bradycardia on maintenance atenolol which was discontinued. With bradycardia, he had increased fatigue. EP has been asked to evaluate for treatment options.   He currently denies chest pain, palpitations, dyspnea, PND, orthopnea, nausea, vomiting, dizziness, syncope, edema, weight gain, or early satiety.  Past Medical History:  Diagnosis Date  . Bradycardia   . Carotid artery disease ([place])    Right CEA;  dopplers 6/12:  0-39% bilat ICA  . Coronary artery disease    s/p BMS to RCA 2002; [place] 2008: oLAD 40%, pRCA stent ok with 20%, EF 60%); Myoview scan in March 2011 which was negative for ischemia   . GERD (gastroesophageal reflux disease)   . Hyperlipidemia   . Hypertension   . Myocardial infarction ([place])   . OSA on CPAP      Surgical History:  Past Surgical History:  Procedure Laterality Date  . CARDIAC CATHETERIZATION  06/01/2007    EF of 65% --  Nonobstructive CAD with 40% ostial stenosis in the LAD, no significant obstruction in the circumflex artery, 20% narrowing within the stent in the proximal to mid right coronary and normal LV function  . CARDIAC CATHETERIZATION  02/02/2006   EF of 50%  . CARDIAC CATHETERIZATION  01/19/2002   EF of 60%  . CARDIAC CATHETERIZATION N/A 12/03/2015   Procedure: Left Heart Cath and Coronary Angiography;  Surgeon: Burnell Blanks, MD;  Location: [place] CV LAB;  Service: Cardiovascular;  Laterality: N/A;  . CAROTID ENDARTERECTOMY  06/26/2002   right  . CORONARY ANGIOPLASTY WITH STENT PLACEMENT  03/08/2000   Bare-metal stent in RCA, in [place]  . KNEE ARTHROSCOPY     right  . MOLE REMOVAL     Prior moles removed     Medications Prior to Admission  Medication Sig Dispense Refill Last Dose  . amLODipine (NORVASC) 10 MG tablet Take 1 tablet by mouth daily (Patient taking differently: Take 10 mg by mouth daily. ) 90 tablet 1 05/30/2018 at Unknown time  . aspirin 81 MG tablet Take 81 mg by mouth daily.     05/29/2018 at Unknown time  . b complex vitamins capsule Take 1 capsule by mouth daily.   05/30/2018 at Unknown time  . clopidogrel (PLAVIX) 75 MG tablet Take 75 mg by mouth daily.   05/30/2018 at 0700  . doxazosin (CARDURA) 2 MG tablet Take 1 tablet (2 mg total) by mouth daily. 180 tablet 1  05/29/2018 at Unknown time  . ezetimibe (ZETIA) 10 MG tablet Take 1 tablet (10 mg total) by mouth daily. Annual appt due in July must see provider for future refills (Patient taking differently: Take 10 mg by mouth daily. ) 90 tablet 1 05/29/2018 at Unknown time  . finasteride (PROSCAR) 5 MG tablet Take 5 mg by mouth daily.    05/30/2018 at Unknown time  . hydrochlorothiazide (HYDRODIURIL) 50 MG tablet Take 0.5 tablets (25 mg total) by mouth daily. 45 tablet 1 05/30/2018 at Unknown time  . isosorbide mononitrate (IMDUR) 30 MG 24 hr tablet Take 1 tablet (30 mg total) by mouth daily. 90 tablet 1 05/30/2018 at  Unknown time  . nitroGLYCERIN (NITROSTAT) 0.4 MG SL tablet Place 1 tablet (0.4 mg total) under the tongue every 5 (five) minutes as needed. Call 9-1-1 if need more than 2. 25 tablet 0 1 year ago  . pantoprazole (PROTONIX) 40 MG tablet Take 1 tablet by mouth every morning. (Patient taking differently: Take 40 mg by mouth daily. ) 90 tablet 3 05/30/2018 at Unknown time  . potassium chloride (K-DUR,KLOR-CON) 10 MEQ tablet TAKE 1 TABLET DAILY (Patient taking differently: Take 10 mEq by mouth at bedtime. ) 90 tablet 0 05/29/2018 at Unknown time  . ramipril (ALTACE) 10 MG capsule TAKE 1 CAPSULE TWICE A DAY (Patient taking differently: Take 10 mg by mouth daily. ) 180 capsule 1 05/30/2018 at Unknown time  . rosuvastatin (CRESTOR) 40 MG tablet Take 1 tablet (40 mg total) by mouth daily. 90 tablet 1 05/29/2018 at Unknown time  . gabapentin (NEURONTIN) 300 MG capsule Take 1 tablet in the morning and 2 tablets at bedtime (Patient not taking: Reported on 05/30/2018) 90 capsule 11 Not Taking at Unknown time    Inpatient Medications:  . amLODipine  10 mg Oral Daily  . apixaban  5 mg Oral BID  . aspirin EC  81 mg Oral Daily  . doxazosin  2 mg Oral Daily  . ezetimibe  10 mg Oral Daily  . finasteride  5 mg Oral Daily  . hydrochlorothiazide  25 mg Oral Daily  . isosorbide mononitrate  30 mg Oral Daily  . pantoprazole  40 mg Oral Daily  . ramipril  10 mg Oral BID  . rosuvastatin  40 mg Oral q1800    Allergies:  Allergies  Allergen Reactions  . Felodipine Swelling  . Niacin     REACTION: not tolerated.    Social History   Socioeconomic History  . Marital status: Married    Spouse name: Not on file  . Number of children: 3  . Years of education: 76  . Highest education level: High school graduate  Occupational History  . Occupation: Barista  . Financial resource strain: Not on file  . Food insecurity:    Worry: Not on file    Inability: Not on file  . Transportation needs:     Medical: Not on file    Non-medical: Not on file  Tobacco Use  . Smoking status: Former Smoker    Packs/day: 1.00    Years: 30.00    Pack years: 30.00    Types: Cigarettes    Last attempt to quit: 10/19/2000    Years since quitting: 17.6  . Smokeless tobacco: Current User    Types: Snuff  . Tobacco comment: Dips snuff.   Substance and Sexual Activity  . Alcohol use: Yes    Comment: Occasional beer  . Drug use: No  .  Sexual activity: Not on file  Lifestyle  . Physical activity:    Days per week: Not on file    Minutes per session: Not on file  . Stress: Not on file  Relationships  . Social connections:    Talks on phone: Not on file    Gets together: Not on file    Attends religious service: Not on file    Active member of club or organization: Not on file    Attends meetings of clubs or organizations: Not on file    Relationship status: Not on file  . Intimate partner violence:    Fear of current or ex partner: Not on file    Emotionally abused: Not on file    Physically abused: Not on file    Forced sexual activity: Not on file  Other Topics Concern  . Not on file  Social History Narrative   Lives with wife in a one story home.  Has 3 children.  Works in Architect.  Education: high school.      Family History  Problem Relation Age of Onset  . Heart failure Father   . Heart disease Father   . Peripheral vascular disease Mother        with pacemaker, and had a carotid endarterectomy  . Heart disease Mother   . Hypertension Brother   . Hypertension Brother      Review of Systems: All other systems reviewed and are otherwise negative except as noted above.  Physical Exam: Vitals:   05/30/18 2306 05/31/18 0036 05/31/18 0559 05/31/18 0816  BP:  121/71 101/70 119/77  Pulse:  71 63 74  Resp:  18 19   Temp:  97.8 F (36.6 C) 97.6 F (36.4 C)   TempSrc:  Oral Oral   SpO2: 98% 97% 95%   Weight:   88 kg   Height:        GEN- The patient is well appearing,  alert and oriented x 3 today.   HEENT: normocephalic, atraumatic; sclera clear, conjunctiva pink; hearing intact; oropharynx clear; neck supple Lungs- Clear to ausculation bilaterally, normal work of breathing.  No wheezes, rales, rhonchi Heart- Regular rate and rhythm  GI- soft, non-tender, non-distended, bowel sounds present Extremities- no clubbing, cyanosis, or edema  MS- no significant deformity or atrophy Skin- warm and dry, no rash or lesion Psych- euthymic mood, full affect Neuro- strength and sensation are intact  Labs:   Lab Results  Component Value Date   WBC 4.9 05/30/2018   HGB 14.4 05/30/2018   HCT 42.0 05/30/2018   MCV 87.7 05/30/2018   PLT 172 05/30/2018    Recent Labs  Lab 05/30/18 1738 05/31/18 0539  NA 139 142  K 3.7 3.9  CL 106 106  CO2 24 28  BUN 16 14  CREATININE 1.11 1.29*  CALCIUM 9.2 9.3  PROT 6.4*  --   BILITOT 1.4*  --   ALKPHOS 49  --   ALT 44  --   AST 30  --   GLUCOSE 77 114*      Radiology/Studies: Dg Chest Port 1 View  Result Date: 05/30/2018 CLINICAL DATA:  New onset atrial fibrillation. EXAM: PORTABLE CHEST 1 VIEW COMPARISON:  01/10/2018 FINDINGS: 1619 hours. The lungs are clear without focal pneumonia, edema, pneumothorax or pleural effusion. The cardiopericardial silhouette is within normal limits for size. The visualized bony structures of the thorax are intact. IMPRESSION: No active disease. Electronically Signed   By: Verda Cumins.D.  On: 05/30/2018 16:46    EKG:AF, rate 114 (personally reviewed)  TELEMETRY: AF -> SR (personally reviewed)  Assessment/Plan: 1.  New onset atrial fibrillation Has likely been intermittent for the last 5 weeks and then persistent over the last few days. He converted to SR with 1 dose of IV metoprolol.  He has had bradycardia in the past with scheduled Atenolol.  We discussed options today including prn BB therapy vs AAD therapy (Tikosyn only real option).  He would like to avoid AADs for now  which I think is reasonable. Would send home on Metoprolol 25mg  prn for palpitations.  I have made follow up with AF clinic next week - he is aware Continue Eliquis for CHADS2VASC of at least 3. He will need CBC, BMET next week at follow up We also discussed risk factor modification for AF today. I have encouraged regular aerobic exercise (walking 37minutes per day, 5 days per week), compliance with CPAP and decreasing ETOH consumption (he currently drinks a 6 pack of beer/week, I have advised no more than 2 drinks per week).    Dr Lovena Le to see later today  Signed, Chanetta Marshall, NP 05/31/2018 8:40 AM    EP Attending  Patient seen and examined. Agree with the findings as noted above. The patient has PAF and has been started on Eliquis. He has sinus node dysfunction which limits our ability to give him AA drug therapy. With only a single episode of atrial fib, I do not think dofetilide is warranted. Prn beta blocker therapy would be reasonable. Would not use chronic beta blocker as he has already had significant bradycardia. ETOH consumption is not helping and he needs to stop drinking. We will sign off. I can see him in the office in several weeks.   Mikle Bosworth.D.

## 2018-05-31 NOTE — Progress Notes (Signed)
  Echocardiogram 2D Echocardiogram has been performed.  Atavia Poppe G Jermond Burkemper 05/31/2018, 10:59 AM

## 2018-06-01 ENCOUNTER — Telehealth: Payer: Self-pay | Admitting: *Deleted

## 2018-06-01 NOTE — Telephone Encounter (Signed)
Pt was on the TCM report admitted 05/30/18 for observation irregular heart rate in the 130s. Was given IV metoprolol improved to less than 100. Pt had echocardiogram finding was intermittent afib for the last 5 weeks and then persistent over the last few days. Pt was D/C 8/13, and will follow up with AF clinic next week...Johny Chess

## 2018-06-08 ENCOUNTER — Encounter (HOSPITAL_COMMUNITY): Payer: Self-pay | Admitting: Nurse Practitioner

## 2018-06-08 ENCOUNTER — Ambulatory Visit (HOSPITAL_COMMUNITY)
Admit: 2018-06-08 | Discharge: 2018-06-08 | Disposition: A | Discharge status: Home / Self Care | Payer: Managed Care, Other (non HMO) | Attending: Nurse Practitioner | Admitting: Nurse Practitioner

## 2018-06-08 VITALS — BP 142/68 | HR 70 | Ht 73.0 in | Wt 199.6 lb

## 2018-06-08 DIAGNOSIS — I480 Paroxysmal atrial fibrillation: Secondary | ICD-10-CM | POA: Insufficient documentation

## 2018-06-08 LAB — BASIC METABOLIC PANEL
Anion gap: 6 (ref 5–15)
BUN: 15 mg/dL (ref 8–23)
CO2: 29 mmol/L (ref 22–32)
Calcium: 9.6 mg/dL (ref 8.9–10.3)
Chloride: 105 mmol/L (ref 98–111)
Creatinine, Ser: 1.16 mg/dL (ref 0.61–1.24)
GFR calc Af Amer: >60 mL/min (ref >60)
GFR calc non Af Amer: >60 mL/min (ref >60)
Glucose, Bld: 169 mg/dL — ABNORMAL HIGH (ref 70–99)
Potassium: 4.3 mmol/L (ref 3.5–5.1)
Sodium: 140 mmol/L (ref 135–145)

## 2018-06-08 LAB — CBC WITH DIFFERENTIAL/PLATELET
Abs Immature Granulocytes: 0 10*3/uL (ref 0.0–0.1)
Basophils Absolute: 0 10*3/uL (ref 0.0–0.1)
Basophils Relative: 1 %
Eosinophils Absolute: 0.1 10*3/uL (ref 0.0–0.7)
Eosinophils Relative: 2 %
HCT: 41.7 % (ref 39.0–52.0)
Hemoglobin: 14.1 g/dL (ref 13.0–17.0)
Immature Granulocytes: 0 %
Lymphocytes Relative: 20 %
Lymphs Abs: 0.7 10*3/uL (ref 0.7–4.0)
MCH: 30.1 pg (ref 26.0–34.0)
MCHC: 33.8 g/dL (ref 30.0–36.0)
MCV: 88.9 fL (ref 78.0–100.0)
Monocytes Absolute: 0.4 10*3/uL (ref 0.1–1.0)
Monocytes Relative: 12 %
Neutro Abs: 2.4 10*3/uL (ref 1.7–7.7)
Neutrophils Relative %: 65 %
Platelets: 155 10*3/uL (ref 150–400)
RBC: 4.69 MIL/uL (ref 4.22–5.81)
RDW: 11.8 % (ref 11.5–15.5)
WBC: 3.6 10*3/uL — ABNORMAL LOW (ref 4.0–10.5)

## 2018-06-08 NOTE — Progress Notes (Signed)
Primary Care Physician: Lance Sell, NP Referring Physician: Dr. Oval Linsey, [facility] f/u   [name] is a 67 y.o. male with a h/o CAD s/p PCI,hypertension, hyperlipidemia, OSA,carotid stenosis s/p CEA,and GERD here with fatigue and shortness of breath and found to have new onset atrial fibrillation with rapid ventricular response. He was seen for preoperative clearance prior to EGD/colonoscopy and was noted to be bradycardic to the 40s 04/2018. At the time he was on atenolol 25mg  which was discontinued. On 48 hour Holter he was noted to be bradcyardic to the 40s overnight as well as 3 second runs of SVT. He has known OSA and is compliant with CPAP. Since that time he continued to be very fatigued. He is typically active and has been on FMLA from work because he cannot walk short distances without palpitations and shortness of breath. He also noted intermittent L sided chest discomfort with radiation to the L shoulder. He checks his HR and BP at home and his monitor sometimes reports and irregular heart rhythm. Repeat Lexiscan Myoview7/19/19 was negative for ischemia. LVEF was 56%. He does not smoke. He drinks ETOH 2-3 beers when fishing, otherwise limited EtOH. One coffee/day.  It was felt ikely intermittent afib for the last 5 weeks and then persistent over the last few days. He converted to SR with 1 dose of IV metoprolol. He has had bradycardia in the past with scheduled Atenolol. Options discussed with  pt including prn BB therapy vs AAD therapy (Tikosyn only real option). He would like to avoid AADs which is reasonable. He was sent home on Metoprolol 25mg  prn for palpitations.  In the afib clinic, he feels well. No further afib. No bleeding issues with Eliquis 5 mg bid. He has metoprolol to use as needed, he has not had to use.   Today, he denies symptoms of palpitations, chest pain, shortness of breath, orthopnea, PND, lower extremity edema, dizziness, presyncope,  syncope, or neurologic sequela. The patient is tolerating medications without difficulties and is otherwise without complaint today.   Past Medical History:  Diagnosis Date  . Bradycardia   . Carotid artery disease ([place])    Right CEA;  dopplers 6/12:  0-39% bilat ICA  . Coronary artery disease    s/p BMS to RCA 2002; [place] 2008: oLAD 40%, pRCA stent ok with 20%, EF 60%); Myoview scan in March 2011 which was negative for ischemia   . GERD (gastroesophageal reflux disease)   . Hyperlipidemia   . Hypertension   . Myocardial infarction ([place])   . OSA on CPAP    Past Surgical History:  Procedure Laterality Date  . CARDIAC CATHETERIZATION  06/01/2007    EF of 65% -- Nonobstructive CAD with 40% ostial stenosis in the LAD, no significant obstruction in the circumflex artery, 20% narrowing within the stent in the proximal to mid right coronary and normal LV function  . CARDIAC CATHETERIZATION  02/02/2006   EF of 50%  . CARDIAC CATHETERIZATION  01/19/2002   EF of 60%  . CARDIAC CATHETERIZATION N/A 12/03/2015   Procedure: Left Heart Cath and Coronary Angiography;  Surgeon: Burnell Blanks, MD;  Location: [place] CV LAB;  Service: Cardiovascular;  Laterality: N/A;  . CAROTID ENDARTERECTOMY  06/26/2002   right  . CORONARY ANGIOPLASTY WITH STENT PLACEMENT  03/08/2000   Bare-metal stent in RCA, in [place]  . KNEE ARTHROSCOPY     right  . MOLE REMOVAL     Prior moles removed  Current Outpatient Medications  Medication Sig Dispense Refill  . amLODipine (NORVASC) 10 MG tablet Take 1 tablet by mouth daily (Patient taking differently: Take 10 mg by mouth daily. ) 90 tablet 1  . apixaban (ELIQUIS) 5 MG TABS tablet Take 1 tablet (5 mg total) by mouth 2 (two) times daily. 60 tablet 11  . aspirin 81 MG tablet Take 81 mg by mouth daily.      Marland Kitchen b complex vitamins capsule Take 1 capsule by mouth daily.    Marland Kitchen doxazosin (CARDURA) 2 MG tablet Take 1 tablet (2 mg total) by mouth daily. 180  tablet 1  . ezetimibe (ZETIA) 10 MG tablet Take 1 tablet (10 mg total) by mouth daily. Annual appt due in July must see provider for future refills (Patient taking differently: Take 10 mg by mouth daily. ) 90 tablet 1  . finasteride (PROSCAR) 5 MG tablet Take 5 mg by mouth daily.     . hydrochlorothiazide (HYDRODIURIL) 50 MG tablet Take 0.5 tablets (25 mg total) by mouth daily. 45 tablet 1  . isosorbide mononitrate (IMDUR) 30 MG 24 hr tablet Take 1 tablet (30 mg total) by mouth daily. 90 tablet 1  . metoprolol tartrate (LOPRESSOR) 25 MG tablet Take 1 tablet (25 mg total) by mouth daily as needed (as needed for palpitations/afib.). 60 tablet 11  . nitroGLYCERIN (NITROSTAT) 0.4 MG SL tablet Place 1 tablet (0.4 mg total) under the tongue every 5 (five) minutes as needed. Call 9-1-1 if need more than 2. 25 tablet 0  . pantoprazole (PROTONIX) 40 MG tablet Take 1 tablet by mouth every morning. (Patient taking differently: Take 40 mg by mouth daily. ) 90 tablet 3  . potassium chloride (K-DUR,KLOR-CON) 10 MEQ tablet TAKE 1 TABLET DAILY (Patient taking differently: Take 10 mEq by mouth at bedtime. ) 90 tablet 0  . ramipril (ALTACE) 10 MG capsule TAKE 1 CAPSULE TWICE A DAY (Patient taking differently: Take 10 mg by mouth daily. ) 180 capsule 1  . rosuvastatin (CRESTOR) 40 MG tablet Take 1 tablet (40 mg total) by mouth daily. 90 tablet 1   No current facility-administered medications for this encounter.     Allergies  Allergen Reactions  . Felodipine Swelling  . Niacin     REACTION: not tolerated.    Social History   Socioeconomic History  . Marital status: Married    Spouse name: Not on file  . Number of children: 3  . Years of education: 65  . Highest education level: High school graduate  Occupational History  . Occupation: Barista  . Financial resource strain: Not on file  . Food insecurity:    Worry: Not on file    Inability: Not on file  . Transportation needs:     Medical: Not on file    Non-medical: Not on file  Tobacco Use  . Smoking status: Former Smoker    Packs/day: 1.00    Years: 30.00    Pack years: 30.00    Types: Cigarettes    Last attempt to quit: 10/19/2000    Years since quitting: 17.6  . Smokeless tobacco: Current User    Types: Snuff  . Tobacco comment: Dips snuff.   Substance and Sexual Activity  . Alcohol use: Yes    Comment: Occasional beer  . Drug use: No  . Sexual activity: Not on file  Lifestyle  . Physical activity:    Days per week: Not on file    Minutes per  session: Not on file  . Stress: Not on file  Relationships  . Social connections:    Talks on phone: Not on file    Gets together: Not on file    Attends religious service: Not on file    Active member of club or organization: Not on file    Attends meetings of clubs or organizations: Not on file    Relationship status: Not on file  . Intimate partner violence:    Fear of current or ex partner: Not on file    Emotionally abused: Not on file    Physically abused: Not on file    Forced sexual activity: Not on file  Other Topics Concern  . Not on file  Social History Narrative   Lives with wife in a one story home.  Has 3 children.  Works in Architect.  Education: high school.     Family History  Problem Relation Age of Onset  . Heart failure Father   . Heart disease Father   . Peripheral vascular disease Mother        with pacemaker, and had a carotid endarterectomy  . Heart disease Mother   . Hypertension Brother   . Hypertension Brother     ROS- All systems are reviewed and negative except as per the HPI above  Physical Exam: Vitals:   06/08/18 1023  BP: (!) 142/68  Pulse: 70  Weight: 90.5 kg  Height: 6\' 1"  (1.854 m)   Wt Readings from Last 3 Encounters:  06/08/18 90.5 kg  05/31/18 88 kg  05/30/18 86.6 kg    Labs: Lab Results  Component Value Date   NA 140 06/08/2018   K 4.3 06/08/2018   CL 105 06/08/2018   CO2 29 06/08/2018    GLUCOSE 169 (H) 06/08/2018   BUN 15 06/08/2018   CREATININE 1.16 06/08/2018   CALCIUM 9.6 06/08/2018   MG 2.0 05/30/2018   Lab Results  Component Value Date   INR 1.01 05/30/2018   Lab Results  Component Value Date   CHOL 114 05/31/2018   HDL 30 (L) 05/31/2018   LDLCALC 62 05/31/2018   TRIG 108 05/31/2018     GEN- The patient is well appearing, alert and oriented x 3 today.   Head- normocephalic, atraumatic Eyes-  Sclera clear, conjunctiva pink Ears- hearing intact Oropharynx- clear Neck- supple, no JVP Lymph- no cervical lymphadenopathy Lungs- Clear to ausculation bilaterally, normal work of breathing Heart- Regular rate and rhythm, no murmurs, rubs or gallops, PMI not laterally displaced GI- soft, NT, ND, + BS Extremities- no clubbing, cyanosis, or edema MS- no significant deformity or atrophy Skin- no rash or lesion Psych- euthymic mood, full affect Neuro- strength and sensation are intact  EKG-NSR at 67 bpm, pr int 166 ms, qrs int 66 ms, qtc 405 ms  Epic records reviewed Echo-Study Conclusions  - Left ventricle: The cavity size was normal. There was mild   concentric hypertrophy. Systolic function was normal. The   estimated ejection fraction was in the range of 60% to 65%. Wall   motion was normal; there were no regional wall motion   abnormalities. Features are consistent with a pseudonormal left   ventricular filling pattern, with concomitant abnormal relaxation   and increased filling pressure (grade 2 diastolic dysfunction). - Aortic valve: Trileaflet; mildly thickened, mildly calcified   leaflets. - Left atrium: The atrium was mildly dilated. - Tricuspid valve: There was trivial regurgitation. - Pulmonic valve: There was mild regurgitation.  Assessment and Plan: 1. New onset  afib Pt is in SR General education re afib Pt was asked to keep alcoholic drinks to no more than  2 a week He will continue to use CPAP, usually very compliant  He has  metoprolol to use prn as he has brady on scheduled BB If antiarrythmic is need then best option is tikosyn  2. Chadavasc score of at least 3 Continue eliquis 5 mg bid General precautions with bleeding reviewed Bmet/cbc drawn per d/c orders  F/u here in 6 weeks, sooner if needed  Butch Penny C. Zakeria Kulzer, [facility] [address] [place], [place] [address] [phone]

## 2018-06-17 ENCOUNTER — Ambulatory Visit: Payer: Managed Care, Other (non HMO) | Admitting: Family

## 2018-06-17 ENCOUNTER — Encounter: Payer: Self-pay | Admitting: Family

## 2018-06-17 ENCOUNTER — Other Ambulatory Visit: Payer: Managed Care, Other (non HMO)

## 2018-06-17 VITALS — BP 128/72 | HR 95 | Temp 98.2°F | Ht 73.0 in | Wt 195.1 lb

## 2018-06-17 DIAGNOSIS — R300 Dysuria: Secondary | ICD-10-CM | POA: Diagnosis not present

## 2018-06-17 DIAGNOSIS — J019 Acute sinusitis, unspecified: Secondary | ICD-10-CM

## 2018-06-17 LAB — POC URINALSYSI DIPSTICK (AUTOMATED)
Bilirubin, UA: NEGATIVE
Blood, UA: POSITIVE
Glucose, UA: NEGATIVE
Ketones, UA: NEGATIVE
Leukocytes, UA: NEGATIVE
Nitrite, UA: NEGATIVE
Protein, UA: POSITIVE — AB
Spec Grav, UA: 1.025 (ref 1.010–1.025)
Urobilinogen, UA: 0.2 E.U./dL
pH, UA: 6 (ref 5.0–8.0)

## 2018-06-17 MED ORDER — AMOXICILLIN-POT CLAVULANATE 875-125 MG PO TABS: 1.0000 | ORAL_TABLET | Freq: Two times a day (BID) | ORAL | 0 refills | Status: DC

## 2018-06-17 NOTE — Progress Notes (Signed)
[name] is a 67 y.o. male with the following history as recorded in EpicCare:  Patient Active Problem List   Diagnosis Date Noted  . Atrial fibrillation ([place]) 05/30/2018  . CAD in native artery   . Preoperative clearance 04/26/2018  . Bradycardia 04/26/2018  . Dizziness 04/26/2018  . Hypersomnia with sleep apnea 12/05/2015  . Unstable angina ([place]) 12/02/2015  . Plantar fasciitis, bilateral 12/13/2012  . Carotid artery disease ([place]) 11/24/2010  . BRADYCARDIA 04/12/2009  . Coronary atherosclerosis 12/25/2008  . Obstructive sleep apnea 02/03/2008  . MYOCARDIAL INFARCTION 02/03/2008  . Hyperlipidemia 10/08/2007  . Essential hypertension 10/08/2007  . GASTROESOPHAGEAL REFLUX DISEASE 10/08/2007    Current Outpatient Medications  Medication Sig Dispense Refill  . amLODipine (NORVASC) 10 MG tablet Take 1 tablet by mouth daily (Patient taking differently: Take 10 mg by mouth daily. ) 90 tablet 1  . apixaban (ELIQUIS) 5 MG TABS tablet Take 1 tablet (5 mg total) by mouth 2 (two) times daily. 60 tablet 11  . aspirin 81 MG tablet Take 81 mg by mouth daily.      Marland Kitchen b complex vitamins capsule Take 1 capsule by mouth daily.    Marland Kitchen doxazosin (CARDURA) 2 MG tablet Take 1 tablet (2 mg total) by mouth daily. 180 tablet 1  . ezetimibe (ZETIA) 10 MG tablet Take 1 tablet (10 mg total) by mouth daily. Annual appt due in July must see provider for future refills (Patient taking differently: Take 10 mg by mouth daily. ) 90 tablet 1  . finasteride (PROSCAR) 5 MG tablet Take 5 mg by mouth daily.     . hydrochlorothiazide (HYDRODIURIL) 50 MG tablet Take 0.5 tablets (25 mg total) by mouth daily. 45 tablet 1  . isosorbide mononitrate (IMDUR) 30 MG 24 hr tablet Take 1 tablet (30 mg total) by mouth daily. 90 tablet 1  . metoprolol tartrate (LOPRESSOR) 25 MG tablet Take 1 tablet (25 mg total) by mouth daily as needed (as needed for palpitations/afib.). 60 tablet 11  . nitroGLYCERIN (NITROSTAT) 0.4 MG SL tablet  Place 1 tablet (0.4 mg total) under the tongue every 5 (five) minutes as needed. Call 9-1-1 if need more than 2. 25 tablet 0  . pantoprazole (PROTONIX) 40 MG tablet Take 1 tablet by mouth every morning. (Patient taking differently: Take 40 mg by mouth daily. ) 90 tablet 3  . potassium chloride (K-DUR,KLOR-CON) 10 MEQ tablet TAKE 1 TABLET DAILY (Patient taking differently: Take 10 mEq by mouth at bedtime. ) 90 tablet 0  . ramipril (ALTACE) 10 MG capsule TAKE 1 CAPSULE TWICE A DAY (Patient taking differently: Take 10 mg by mouth daily. ) 180 capsule 1  . rosuvastatin (CRESTOR) 40 MG tablet Take 1 tablet (40 mg total) by mouth daily. 90 tablet 1  . amoxicillin-clavulanate (AUGMENTIN) 875-125 MG tablet Take 1 tablet by mouth 2 (two) times daily. 20 tablet 0   No current facility-administered medications for this visit.     Allergies: Felodipine and Niacin  Past Medical History:  Diagnosis Date  . Bradycardia   . Carotid artery disease ([place])    Right CEA;  dopplers 6/12:  0-39% bilat ICA  . Coronary artery disease    s/p BMS to RCA 2002; [place] 2008: oLAD 40%, pRCA stent ok with 20%, EF 60%); Myoview scan in March 2011 which was negative for ischemia   . GERD (gastroesophageal reflux disease)   . Hyperlipidemia   . Hypertension   . Myocardial infarction ([place])   .  OSA on CPAP     Past Surgical History:  Procedure Laterality Date  . CARDIAC CATHETERIZATION  06/01/2007    EF of 65% -- Nonobstructive CAD with 40% ostial stenosis in the LAD, no significant obstruction in the circumflex artery, 20% narrowing within the stent in the proximal to mid right coronary and normal LV function  . CARDIAC CATHETERIZATION  02/02/2006   EF of 50%  . CARDIAC CATHETERIZATION  01/19/2002   EF of 60%  . CARDIAC CATHETERIZATION N/A 12/03/2015   Procedure: Left Heart Cath and Coronary Angiography;  Surgeon: Burnell Blanks, MD;  Location: [place] [facility];  Service: Cardiovascular;  Laterality: N/A;  .  CAROTID ENDARTERECTOMY  06/26/2002   right  . CORONARY ANGIOPLASTY WITH STENT PLACEMENT  03/08/2000   Bare-metal stent in RCA, in [place]  . KNEE ARTHROSCOPY     right  . MOLE REMOVAL     Prior moles removed    Family History  Problem Relation Age of Onset  . Heart failure Father   . Heart disease Father   . Peripheral vascular disease Mother        with pacemaker, and had a carotid endarterectomy  . Heart disease Mother   . Hypertension Brother   . Hypertension Brother     Social History   Tobacco Use  . Smoking status: Former Smoker    Packs/day: 1.00    Years: 30.00    Pack years: 30.00    Types: Cigarettes    Last attempt to quit: 10/19/2000    Years since quitting: 17.6  . Smokeless tobacco: Current User    Types: Snuff  . Tobacco comment: Dips snuff.   Substance Use Topics  . Alcohol use: Yes    Comment: Occasional beer    Subjective:  Patient presents with his wife today; concerns for possible urinary tract infection; + burning on urination and some right sided back pain; no fever, no blood in urine; no prior history of kidney stone; Also concerned about possible sinus infection; + sinus pain/ pressure; + headache;     Objective:  Vitals:   06/17/18 1432  BP: 128/72  Pulse: 95  Temp: 98.2 F (36.8 C)  TempSrc: Oral  SpO2: 96%  Weight: 195 lb 1.3 oz (88.5 kg)  Height: 6\' 1"  (1.854 m)    General: Well developed, well nourished, in no acute distress  Skin : Warm and dry.  Head: Normocephalic and atraumatic  Eyes: Sclera and conjunctiva clear; pupils round and reactive to light; extraocular movements intact  Ears: External normal; canals clear; tympanic membranes normal  Oropharynx: Pink, supple. No suspicious lesions  Neck: Supple without thyromegaly, adenopathy  Lungs: Respirations unlabored; clear to auscultation bilaterally without wheeze, rales, rhonchi  CVS exam: normal rate and regular rhythm.  Musculoskeletal: No deformities; no active joint  inflammation; negative CVA tenderness  Extremities: No edema, cyanosis, clubbing  Vessels: Symmetric bilaterally  Neurologic: Alert and oriented; speech intact; face symmetrical; moves all extremities well; CNII-XII intact without focal deficit   Assessment:  1. Dysuria   2. Acute sinusitis, recurrence not specified, unspecified location     Plan:  Check U/A and urine culture today; Rx for Augmentin 875 mg bid x 10 days; strict ER precautions for upcoming holiday weekend;   No follow-ups on file.  Orders Placed This Encounter  Procedures  . Urine Culture    Standing Status:   Future    Number of Occurrences:   1    Standing  Expiration Date:   06/17/2019  . POCT Urinalysis Dipstick (Automated)    Requested Prescriptions   Signed Prescriptions Disp Refills  . amoxicillin-clavulanate (AUGMENTIN) 875-125 MG tablet 20 tablet 0    Sig: Take 1 tablet by mouth 2 (two) times daily.

## 2018-06-18 LAB — URINE CULTURE
MICRO NUMBER:: 91041613
Result:: NO GROWTH
SPECIMEN QUALITY:: ADEQUATE

## 2018-06-28 ENCOUNTER — Encounter (HOSPITAL_COMMUNITY): Payer: Self-pay

## 2018-07-14 ENCOUNTER — Encounter: Payer: Self-pay | Admitting: Neurology

## 2018-07-14 ENCOUNTER — Ambulatory Visit (INDEPENDENT_AMBULATORY_CARE_PROVIDER_SITE_OTHER): Payer: Managed Care, Other (non HMO) | Admitting: Neurology

## 2018-07-14 DIAGNOSIS — Z9861 Coronary angioplasty status: Secondary | ICD-10-CM

## 2018-07-14 DIAGNOSIS — I25111 Atherosclerotic heart disease of native coronary artery with angina pectoris with documented spasm: Secondary | ICD-10-CM | POA: Diagnosis not present

## 2018-07-14 DIAGNOSIS — Z9862 Peripheral vascular angioplasty status: Secondary | ICD-10-CM

## 2018-07-14 DIAGNOSIS — R292 Abnormal reflex: Secondary | ICD-10-CM

## 2018-07-14 DIAGNOSIS — G4733 Obstructive sleep apnea (adult) (pediatric): Secondary | ICD-10-CM | POA: Diagnosis not present

## 2018-07-14 DIAGNOSIS — G478 Other sleep disorders: Secondary | ICD-10-CM

## 2018-07-14 DIAGNOSIS — Z9989 Dependence on other enabling machines and devices: Secondary | ICD-10-CM

## 2018-07-14 NOTE — Progress Notes (Addendum)
SLEEP MEDICINE CLINIC   Provider:  Larey Reid, [place] D  Primary Care Physician:  Brandon Sell, NP   Referring Provider: Lance Sell, NP   Chief Complaint  Patient presents with  . New Patient (Initial Visit)    pt with wife, rm 56. pt states that he uses his CPAP machine. he had a sleep study over 10 years ago. states that the current machine is his 3rd machine started this one 2017. DME was/is [facility] based off the supply bag and compliance report. pt was unable to tell me for sure.   Chief complaint according to patient : he needs regular supplies and retesting.   HPI:  [name] is a 67 y.o. male patient referred by [facility] for a transfer of apnea care. The patient was just diagnosed with atrial fibrillation in July 2019, and is needed a follow up with sleep medicine. He has been found bradycardic,HR in his 58s.   He is unsure where his previous sleep studies took place and whom he followed, he has gotten supplies sporadically through [facility] He is 67 but not on medicare. He is on his second CPAP since 2017 (?), changed DMEs at that time. Had no repeat sleep study ever - he believes.. He had a sleep study in 02-04-2008  in [place], in the [facility]. Brandon. [place] Reid. The nocturnal polysomnography as interpreted on 04 February 2008 showed an RDI of 5.1 events per hour, he had 28 so-called respiratory related arousals.  These are usually signs of upper airway resistance he is snoring.  He had only 6 apneas for the whole night the events were clearly louder and worse during REM sleep.  [name] made a note that there was loud and [place] snoring noted throughout.  He also noted that he should be given CPAP treatment given his comorbidity of cardiac disease.  [name] has been a very compliant CPAP user and I was able to review data for the last 365 days.  We reviewed the 30-day time span for compliance assurance and she has been 97% of compliant, with an  average user time at night of 9 hours and 18 minutes.  The machine is an AutoSet between 5 and 15 cmH2O pressure with full-time 2 cm EPR setting.  Residual AHI is 0.6 which is an ideal resolution, his pressure at 95th percentile was 10 cmH2O this is well within the current prescribed pressure window.  He does not have high air leaks, but he does sometimes lose the air seal probably because of facial hair and using a full facemask.   Sleep habits are as follows: Dinner 7 PM, bedtime 9 PM and promptly asleep, sleeps on his sides, on 2 pillows. He has 1-2 times nocturia, and other wise sleeps through. He wakes up refreshed -  up at 5.30 AM. He uses a FFM, has facial hair.   Sleep medical history and family sleep history:  Brother [name] has OSA and is on CPAP  . Patient had carotid artery stent. Had CAD with MI in 2008 No other ENT or neck - cardiac angioplasty with stent placement.   Quote from cardiology visit ; "[name] is a 67 y.o. male with a h/o CAD s/p PCI,hypertension, hyperlipidemia, OSA,carotid stenosis s/p CEA,and GERD here with fatigue and shortness of breath and found to have new onset atrial fibrillation with rapid ventricular response. He was seen for preoperative clearance prior to EGD/colonoscopy and was noted to be bradycardic  to the 40s 04/2018. At the time he was on atenolol 25mg  which was discontinued. On 48 hour Holter he was noted to be bradcyardic to the 40s overnight as well as 3 second runs of SVT. He has known OSA and is compliant with CPAP. Since that time he continuedto be very fatigued. He is typically active and has been on FMLA from work because he cannot walk short distances without palpitations and shortness of breath. He also noted intermittent L sided chest discomfort with radiation to the L shoulder. He checks his HR and BP at home and his monitor sometimes reports and irregular heart rhythm. Repeat Lexiscan Myoview7/19/19 was negative for ischemia. LVEF was  56%. He does not smoke. He drinks ETOH 2-3 beers when fishing, otherwise limited EtOH. One coffee/day.  It was felt ikely intermittentafibfor the last 5 weeks and then persistent over the last few days. He converted to SR with 1 dose of IV metoprolol. He has had bradycardia in the past with scheduled Atenolol.Optionsdiscussed with ptincluding prn BB therapy vs AAD therapy (Tikosyn only real option). He would like to avoid AADs which is reasonable. He was sent home on Metoprolol 25mg  prn for palpitations.  In the afib clinic, he feels well. No further afib. No bleeding issues with Eliquis 5 mg bid. He has metoprolol to use as needed, he has not had to use". Brandon Skeet Latch, MD   Social history: non smoker, ETOH - 2-3 beers weekend nights. Caffeine ; 1 cup of coffee in AM, rarely tea, no colas.  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   Review of Systems: Out of a complete 14 system review, the patient complains of only the following symptoms, and all other reviewed systems are negative.  He cannot sleep without his CPAP, he snores.   Epworth score : 0 , Fatigue severity score 10  , depression score N/A   Social History   Socioeconomic History  . Marital status: Married    Spouse name: Not on file  . Number of children: 3  . Years of education: 61  . Highest education level: High school graduate  Occupational History  . Occupation: Barista  . Financial resource strain: Not on file  . Food insecurity:    Worry: Not on file    Inability: Not on file  . Transportation needs:    Medical: Not on file    Non-medical: Not on file  Tobacco Use  . Smoking status: Former Smoker    Packs/day: 1.00    Years: 30.00    Pack years: 30.00    Types: Cigarettes    Last attempt to quit: 10/19/2000    Years since quitting: 17.7  . Smokeless tobacco: Current User    Types: Snuff  . Tobacco comment: Dips snuff.     Substance and Sexual Activity  . Alcohol use: Yes    Comment: Occasional beer  . Drug use: No  . Sexual activity: Not on file  Lifestyle  . Physical activity:    Days per week: Not on file    Minutes per session: Not on file  . Stress: Not on file  Relationships  . Social connections:    Talks on phone: Not on file    Gets together: Not on file    Attends religious service: Not on file    Active member of club or organization: Not on file    Attends meetings of clubs or organizations: Not on file  Relationship status: Not on file  . Intimate partner violence:    Fear of current or ex partner: Not on file    Emotionally abused: Not on file    Physically abused: Not on file    Forced sexual activity: Not on file  Other Topics Concern  . Not on file  Social History Narrative   Lives with wife in a one story home.  Has 3 children.  Works in Architect.  Education: high school.   [place]  Family History  Problem Relation Age of Onset  . Heart failure Father   . Heart disease Father   . Peripheral vascular disease Mother        with pacemaker, and had a carotid endarterectomy  . Heart disease Mother   . Hypertension Brother   . Hypertension Brother     Past Medical History:  Diagnosis Date  . Atrial fibrillation ([place])   . Bradycardia   . Carotid artery disease ([place])    Right CEA;  dopplers 6/12:  0-39% bilat ICA  . Coronary artery disease    s/p BMS to RCA 2002; [place] 2008: oLAD 40%, pRCA stent ok with 20%, EF 60%); Myoview scan in March 2011 which was negative for ischemia   . GERD (gastroesophageal reflux disease)   . Hyperlipidemia   . Hypertension   . Myocardial infarction ([place])   . OSA on CPAP     Past Surgical History:  Procedure Laterality Date  . CARDIAC CATHETERIZATION  06/01/2007    EF of 65% -- Nonobstructive CAD with 40% ostial stenosis in the LAD, no significant obstruction in the circumflex artery, 20% narrowing within the  stent in the proximal to mid right coronary and normal LV function  . CARDIAC CATHETERIZATION  02/02/2006   EF of 50%  . CARDIAC CATHETERIZATION  01/19/2002   EF of 60%  . CARDIAC CATHETERIZATION N/A 12/03/2015   Procedure: Left Heart Cath and Coronary Angiography;  Surgeon: Burnell Blanks, MD;  Location: [place] [facility];  Service: Cardiovascular;  Laterality: N/A;  . CAROTID ENDARTERECTOMY  06/26/2002   right  . CORONARY ANGIOPLASTY WITH STENT PLACEMENT  03/08/2000   Bare-metal stent in RCA, in [place]  . KNEE ARTHROSCOPY     right  . MOLE REMOVAL     Prior moles removed    Current Outpatient Medications  Medication Sig Dispense Refill  . amLODipine (NORVASC) 10 MG tablet Take 1 tablet by mouth daily (Patient taking differently: Take 10 mg by mouth daily. ) 90 tablet 1  . apixaban (ELIQUIS) 5 MG TABS tablet Take 1 tablet (5 mg total) by mouth 2 (two) times daily. 60 tablet 11  . aspirin 81 MG tablet Take 81 mg by mouth daily.      Marland Kitchen b complex vitamins capsule Take 1 capsule by mouth daily.    Marland Kitchen doxazosin (CARDURA) 2 MG tablet Take 1 tablet (2 mg total) by mouth daily. 180 tablet 1  . ezetimibe (ZETIA) 10 MG tablet Take 10 mg by mouth daily.    . finasteride (PROSCAR) 5 MG tablet Take 5 mg by mouth daily.     . hydrochlorothiazide (HYDRODIURIL) 50 MG tablet Take 0.5 tablets (25 mg total) by mouth daily. 45 tablet 1  . isosorbide mononitrate (IMDUR) 30 MG 24 hr tablet Take 1 tablet (30 mg total) by mouth daily. 90 tablet 1  . metoprolol tartrate (LOPRESSOR) 25 MG tablet Take 1 tablet (25 mg total) by mouth  daily as needed (as needed for palpitations/afib.). 60 tablet 11  . nitroGLYCERIN (NITROSTAT) 0.4 MG SL tablet Place 1 tablet (0.4 mg total) under the tongue every 5 (five) minutes as needed. Call 9-1-1 if need more than 2. 25 tablet 0  . pantoprazole (PROTONIX) 40 MG tablet Take 1 tablet by mouth every morning. (Patient taking differently: Take 40 mg by mouth daily. ) 90  tablet 3  . potassium chloride (K-DUR,KLOR-CON) 10 MEQ tablet TAKE 1 TABLET DAILY (Patient taking differently: Take 10 mEq by mouth at bedtime. ) 90 tablet 0  . ramipril (ALTACE) 10 MG capsule TAKE 1 CAPSULE TWICE A DAY (Patient taking differently: Take 10 mg by mouth daily. ) 180 capsule 1  . rosuvastatin (CRESTOR) 40 MG tablet Take 1 tablet (40 mg total) by mouth daily. 90 tablet 1   No current facility-administered medications for this visit.     Allergies as of 07/14/2018 - Review Complete 07/14/2018  Allergen Reaction Noted  . Felodipine Swelling 02/15/2012  . Niacin      Vitals: There were no vitals taken for this visit. Last Weight:  Wt Readings from Last 1 Encounters:  06/17/18 195 lb 1.3 oz (88.5 kg)   SHF:WYOVZ is no height or weight on file to calculate BMI.       Last Height:   Ht Readings from Last 1 Encounters:  06/17/18 6\' 1"  (1.854 m)   BMI: 29.8 on 07-14-2018  Physical exam:  General: The patient is awake, alert and appears not in acute distress. The patient is well groomed. Head: Normocephalic, atraumatic. Neck is supple. Mallampati 3 neck circumference:17" . Nasal airflow restricted,  Retrognathia is seen.  Cardiovascular:  Regular rate and rhythm, without  murmurs or carotid bruit, and without distended neck veins. Respiratory: Lungs are clear to auscultation. Skin:  Without evidence of edema, or rash Trunk: BMI is just below 30 . The patient's posture is erect.   Neurologic exam : The patient is awake and alert, oriented to place and time. Attention span & concentration ability appears normal.  Speech is fluent, without dysarthria, dysphonia or aphasia.  Mood and affect are appropriate.  Cranial nerves: No changes in taste or smell.  Pupils are equal and briskly reactive to light. Extraocular movements  in vertical and horizontal planes intact and without nystagmus. Visual fields by finger perimetry are intact.Hearing to finger rub intact.  Facial  sensation intact to fine touch. Facial motor strength is symmetric and tongue and uvula move midline. Shoulder shrug was symmetrical.   Motor exam: Normal tone, muscle bulk and symmetric strength in all extremities. Sensory:  Fine touch, pinprick and vibration were normal. Coordination:Finger-to-nose maneuver  normal without evidence of ataxia, dysmetria or tremor. Gait and station: Patient walks without assistive device . Turns with 3 Steps. Romberg testing is negative. Deep tendon reflexes: in the upper and lower extremities are symmetric and intact. Babinski maneuver response is downgoing. Assessment:  After physical and neurologic examination, review of laboratory studies,  Personal review of imaging studies, reports of other /same  Imaging studies, results of polysomnography and / or neurophysiology testing and pre-existing records as far as provided in visit., my assessment is   1) [name] is a new patient to [facility] and his last sleep study was performed about 10 years ago at [facility], apparently he changed to DME from [facility] to [place] about 2 years ago and at that time gotten CPAP machine this time in  autotitrator.  The machine is in good shape there is no evidence of any technical malfunction, he has been a highly compliant user and his residual AHI is 0.6.  I would not have to need to change any of the settings and he is comfortable with using his full facemask and would like to stick to the same model.    Since he is changing providers I will have to document that he still has sleep apnea, and here is the difficulty - because his last sleep study only showed an RDI of 5.1.  I will only be able to continue CPAP prescriptions and supply prescriptions if he has an AHI of 5/h or higher.   If possible I would like to send him home with a home sleep test to confirm the presence of apnea this would also make it easier for the patient. [employer].    The patient was advised of the nature of the diagnosed disorder , the treatment options and the  risks for general health and wellness arising from not treating the condition.   I spent more than 35 minutes of face to face time with the patient.  Greater than 50% of time was spent in counseling and coordination of care. We have discussed the diagnosis and differential and I answered the patient's questions.    Plan:  Treatment plan and additional workup :  HST to confirm OSA and continue supplies for autotitration. No change in settings.     Brandon Seat, MD 7/41/6384, 5:36 PM  Certified in Neurology by ABPN Certified in [place] by [facility] [address], [place],  [address]

## 2018-07-20 ENCOUNTER — Ambulatory Visit (HOSPITAL_COMMUNITY)
Admission: RE | Admit: 2018-07-20 | Discharge: 2018-07-20 | Disposition: A | Discharge status: Home / Self Care | Payer: Managed Care, Other (non HMO) | Source: Ambulatory Visit | Attending: Nurse Practitioner | Admitting: Nurse Practitioner

## 2018-07-20 ENCOUNTER — Encounter (HOSPITAL_COMMUNITY): Payer: Self-pay | Admitting: Nurse Practitioner

## 2018-07-20 VITALS — BP 140/78 | HR 61 | Ht 73.0 in | Wt 197.0 lb

## 2018-07-20 DIAGNOSIS — I480 Paroxysmal atrial fibrillation: Secondary | ICD-10-CM | POA: Diagnosis not present

## 2018-07-20 DIAGNOSIS — Z7982 Long term (current) use of aspirin: Secondary | ICD-10-CM | POA: Insufficient documentation

## 2018-07-20 DIAGNOSIS — G4733 Obstructive sleep apnea (adult) (pediatric): Secondary | ICD-10-CM | POA: Insufficient documentation

## 2018-07-20 DIAGNOSIS — E785 Hyperlipidemia, unspecified: Secondary | ICD-10-CM | POA: Insufficient documentation

## 2018-07-20 DIAGNOSIS — Z7901 Long term (current) use of anticoagulants: Secondary | ICD-10-CM | POA: Diagnosis not present

## 2018-07-20 DIAGNOSIS — I252 Old myocardial infarction: Secondary | ICD-10-CM | POA: Diagnosis not present

## 2018-07-20 DIAGNOSIS — Z79899 Other long term (current) drug therapy: Secondary | ICD-10-CM | POA: Insufficient documentation

## 2018-07-20 DIAGNOSIS — Z8249 Family history of ischemic heart disease and other diseases of the circulatory system: Secondary | ICD-10-CM | POA: Diagnosis not present

## 2018-07-20 DIAGNOSIS — Z955 Presence of coronary angioplasty implant and graft: Secondary | ICD-10-CM | POA: Diagnosis not present

## 2018-07-20 DIAGNOSIS — K219 Gastro-esophageal reflux disease without esophagitis: Secondary | ICD-10-CM | POA: Diagnosis not present

## 2018-07-20 DIAGNOSIS — I10 Essential (primary) hypertension: Secondary | ICD-10-CM | POA: Diagnosis not present

## 2018-07-20 DIAGNOSIS — I371 Nonrheumatic pulmonary valve insufficiency: Secondary | ICD-10-CM | POA: Diagnosis not present

## 2018-07-20 DIAGNOSIS — I4891 Unspecified atrial fibrillation: Secondary | ICD-10-CM | POA: Diagnosis present

## 2018-07-20 DIAGNOSIS — I2510 Atherosclerotic heart disease of native coronary artery without angina pectoris: Secondary | ICD-10-CM | POA: Diagnosis not present

## 2018-07-20 DIAGNOSIS — Z87891 Personal history of nicotine dependence: Secondary | ICD-10-CM | POA: Insufficient documentation

## 2018-07-20 LAB — CBC
HCT: 40 % (ref 39.0–52.0)
Hemoglobin: 13.6 g/dL (ref 13.0–17.0)
MCH: 30.4 pg (ref 26.0–34.0)
MCHC: 34 g/dL (ref 30.0–36.0)
MCV: 89.5 fL (ref 78.0–100.0)
Platelets: 154 10*3/uL (ref 150–400)
RBC: 4.47 MIL/uL (ref 4.22–5.81)
RDW: 12.5 % (ref 11.5–15.5)
WBC: 4.1 10*3/uL (ref 4.0–10.5)

## 2018-07-20 NOTE — Progress Notes (Addendum)
Primary Care Physician: Lance Sell, NP Referring Physician: Dr. Oval Linsey, [facility] f/u   [name] is a 67 y.o. male with a h/o CAD s/p PCI,hypertension, hyperlipidemia, OSA,carotid stenosis s/p CEA,and GERD here with fatigue and shortness of breath and found to have new onset atrial fibrillation with rapid ventricular response. He was seen for preoperative clearance prior to EGD/colonoscopy and was noted to be bradycardic to the 40s 04/2018. At the time he was on atenolol 25mg  which was discontinued. On 48 hour Holter he was noted to be bradcyardic to the 40s overnight as well as 3 second runs of SVT. He has known OSA and is compliant with CPAP. Since that time he continued to be very fatigued. He is typically active and has been on FMLA from work because he cannot walk short distances without palpitations and shortness of breath. He also noted intermittent L sided chest discomfort with radiation to the L shoulder. He checks his HR and BP at home and his monitor sometimes reports and irregular heart rhythm. Repeat Lexiscan Myoview7/19/19 was negative for ischemia. LVEF was 56%. He does not smoke. He drinks ETOH 2-3 beers when fishing, otherwise limited EtOH. One coffee/day.  It was felt ikely intermittent afib for the last 5 weeks and then persistent over the last few days. He converted to SR with 1 dose of IV metoprolol. He has had bradycardia in the past with scheduled Atenolol. Options discussed with  pt including prn BB therapy vs AAD therapy (Tikosyn only real option). He would like to avoid AADs which is reasonable. He was sent home on Metoprolol 25mg  prn for palpitations.  In the afib clinic, he feels well. No further afib. No bleeding issues with Eliquis 5 mg bid. He has metoprolol to use as needed, he has not had to use.   F/u in afib clinic, 10/2. He feels well. No further afib. No issues with bleeding anticoagulation. Reports a random  burring left chest that goes  down left arm that will last 15 mins. Is not tied to activity and had a low risk stress test in July. Has cut back on some alcohol.  Today, he denies symptoms of palpitations, chest pain, shortness of breath, orthopnea, PND, lower extremity edema, dizziness, presyncope, syncope, or neurologic sequela. The patient is tolerating medications without difficulties and is otherwise without complaint today.   Past Medical History:  Diagnosis Date  . Atrial fibrillation ([place])   . Bradycardia   . Carotid artery disease ([place])    Right CEA;  dopplers 6/12:  0-39% bilat ICA  . Coronary artery disease    s/p BMS to RCA 2002; [place] 2008: oLAD 40%, pRCA stent ok with 20%, EF 60%); Myoview scan in March 2011 which was negative for ischemia   . GERD (gastroesophageal reflux disease)   . Hyperlipidemia   . Hypertension   . Myocardial infarction ([place])   . OSA on CPAP    Past Surgical History:  Procedure Laterality Date  . CARDIAC CATHETERIZATION  06/01/2007    EF of 65% -- Nonobstructive CAD with 40% ostial stenosis in the LAD, no significant obstruction in the circumflex artery, 20% narrowing within the stent in the proximal to mid right coronary and normal LV function  . CARDIAC CATHETERIZATION  02/02/2006   EF of 50%  . CARDIAC CATHETERIZATION  01/19/2002   EF of 60%  . CARDIAC CATHETERIZATION N/A 12/03/2015   Procedure: Left Heart Cath and Coronary Angiography;  Surgeon: Burnell Blanks, MD;  Location: [facility];  Service: Cardiovascular;  Laterality: N/A;  . CAROTID ENDARTERECTOMY  06/26/2002   right  . CORONARY ANGIOPLASTY WITH STENT PLACEMENT  03/08/2000   Bare-metal stent in RCA, in [place]  . KNEE ARTHROSCOPY     right  . MOLE REMOVAL     Prior moles removed    Current Outpatient Medications  Medication Sig Dispense Refill  . amLODipine (NORVASC) 10 MG tablet Take 1 tablet by mouth daily (Patient taking differently: Take 10 mg by mouth daily. ) 90 tablet 1  . apixaban  (ELIQUIS) 5 MG TABS tablet Take 1 tablet (5 mg total) by mouth 2 (two) times daily. 60 tablet 11  . aspirin 81 MG tablet Take 81 mg by mouth daily.      Marland Kitchen b complex vitamins capsule Take 1 capsule by mouth daily.    Marland Kitchen doxazosin (CARDURA) 2 MG tablet Take 1 tablet (2 mg total) by mouth daily. 180 tablet 1  . ezetimibe (ZETIA) 10 MG tablet Take 10 mg by mouth daily.    . finasteride (PROSCAR) 5 MG tablet Take 5 mg by mouth daily.     . hydrochlorothiazide (HYDRODIURIL) 50 MG tablet Take 0.5 tablets (25 mg total) by mouth daily. 45 tablet 1  . isosorbide mononitrate (IMDUR) 30 MG 24 hr tablet Take 1 tablet (30 mg total) by mouth daily. 90 tablet 1  . metoprolol tartrate (LOPRESSOR) 25 MG tablet Take 1 tablet (25 mg total) by mouth daily as needed (as needed for palpitations/afib.). 60 tablet 11  . nitroGLYCERIN (NITROSTAT) 0.4 MG SL tablet Place 1 tablet (0.4 mg total) under the tongue every 5 (five) minutes as needed. Call 9-1-1 if need more than 2. 25 tablet 0  . pantoprazole (PROTONIX) 40 MG tablet Take 1 tablet by mouth every morning. (Patient taking differently: Take 40 mg by mouth daily. ) 90 tablet 3  . potassium chloride (K-DUR,KLOR-CON) 10 MEQ tablet TAKE 1 TABLET DAILY (Patient taking differently: Take 10 mEq by mouth at bedtime. ) 90 tablet 0  . ramipril (ALTACE) 10 MG capsule TAKE 1 CAPSULE TWICE A DAY (Patient taking differently: Take 10 mg by mouth daily. ) 180 capsule 1  . rosuvastatin (CRESTOR) 40 MG tablet Take 1 tablet (40 mg total) by mouth daily. 90 tablet 1   No current facility-administered medications for this encounter.     Allergies  Allergen Reactions  . Felodipine Swelling  . Niacin     REACTION: not tolerated.    Social History   Socioeconomic History  . Marital status: Married    Spouse name: Not on file  . Number of children: 3  . Years of education: 92  . Highest education level: High school graduate  Occupational History  . Occupation: Tree surgeon  . Financial resource strain: Not on file  . Food insecurity:    Worry: Not on file    Inability: Not on file  . Transportation needs:    Medical: Not on file    Non-medical: Not on file  Tobacco Use  . Smoking status: Former Smoker    Packs/day: 1.00    Years: 30.00    Pack years: 30.00    Types: Cigarettes    Last attempt to quit: 10/19/2000    Years since quitting: 17.7  . Smokeless tobacco: Current User    Types: Snuff  . Tobacco comment: Dips snuff.   Substance and Sexual Activity  . Alcohol use: Yes  Comment: Occasional beer  . Drug use: No  . Sexual activity: Not on file  Lifestyle  . Physical activity:    Days per week: Not on file    Minutes per session: Not on file  . Stress: Not on file  Relationships  . Social connections:    Talks on phone: Not on file    Gets together: Not on file    Attends religious service: Not on file    Active member of club or organization: Not on file    Attends meetings of clubs or organizations: Not on file    Relationship status: Not on file  . Intimate partner violence:    Fear of current or ex partner: Not on file    Emotionally abused: Not on file    Physically abused: Not on file    Forced sexual activity: Not on file  Other Topics Concern  . Not on file  Social History Narrative   Lives with wife in a one story home.  Has 3 children.  Works in Architect.  Education: high school.     Family History  Problem Relation Age of Onset  . Heart failure Father   . Heart disease Father   . Peripheral vascular disease Mother        with pacemaker, and had a carotid endarterectomy  . Heart disease Mother   . Hypertension Brother   . Hypertension Brother     ROS- All systems are reviewed and negative except as per the HPI above  Physical Exam: Vitals:   07/20/18 1041  BP: 140/78  Pulse: 61  Weight: 89.4 kg  Height: 6\' 1"  (1.854 m)   Wt Readings from Last 3 Encounters:  07/20/18 89.4 kg  06/17/18  88.5 kg  06/08/18 90.5 kg    Labs: Lab Results  Component Value Date   NA 140 06/08/2018   K 4.3 06/08/2018   CL 105 06/08/2018   CO2 29 06/08/2018   GLUCOSE 169 (H) 06/08/2018   BUN 15 06/08/2018   CREATININE 1.16 06/08/2018   CALCIUM 9.6 06/08/2018   MG 2.0 05/30/2018   Lab Results  Component Value Date   INR 1.01 05/30/2018   Lab Results  Component Value Date   CHOL 114 05/31/2018   HDL 30 (L) 05/31/2018   LDLCALC 62 05/31/2018   TRIG 108 05/31/2018     GEN- The patient is well appearing, alert and oriented x 3 today.   Head- normocephalic, atraumatic Eyes-  Sclera clear, conjunctiva pink Ears- hearing intact Oropharynx- clear Neck- supple, no JVP Lymph- no cervical lymphadenopathy Lungs- Clear to ausculation bilaterally, normal work of breathing Heart- Regular rate and rhythm, no murmurs, rubs or gallops, PMI not laterally displaced GI- soft, NT, ND, + BS Extremities- no clubbing, cyanosis, or edema MS- no significant deformity or atrophy Skin- no rash or lesion Psych- euthymic mood, full affect Neuro- strength and sensation are intact  EKG-NSR at 61 bpm, pr int 156 ms, qrs int 84 ms, qtc 418 ms  Epic records reviewed Echo-Study Conclusions  - Left ventricle: The cavity size was normal. There was mild   concentric hypertrophy. Systolic function was normal. The   estimated ejection fraction was in the range of 60% to 65%. Wall   motion was normal; there were no regional wall motion   abnormalities. Features are consistent with a pseudonormal left   ventricular filling pattern, with concomitant abnormal relaxation   and increased filling pressure (grade 2 diastolic dysfunction). -  Aortic valve: Trileaflet; mildly thickened, mildly calcified   leaflets. - Left atrium: The atrium was mildly dilated. - Tricuspid valve: There was trivial regurgitation. - Pulmonic valve: There was mild regurgitation.    Assessment and Plan: 1. New onset  afib Pt has  continued in sinus rhythm, no further awareness of afib Pt was asked to keep alcoholic drinks to no more than  2 a week He will continue to use CPAP, usually very compliant  He has metoprolol to use prn as he has brady on scheduled BB If antiarrythmic is need then best option is tikosyn  2. Chadavasc score of at least 3 Continue eliquis 5 mg bid General precautions with bleeding reviewed cbc drawn today with onset of anticoagultion 6 weeks ago  F/u here in 6 weeks as needed He has recall with Dr. Angelena Form  in January, he was advised to see him sooner if intermittent chest pains escalate  Butch Penny C. Adaeze Better, [facility] [address] [place], [place] [address] [phone]

## 2018-08-05 ENCOUNTER — Ambulatory Visit: Payer: Self-pay

## 2018-08-05 ENCOUNTER — Ambulatory Visit: Payer: Managed Care, Other (non HMO) | Admitting: Family Medicine

## 2018-08-05 ENCOUNTER — Encounter: Payer: Self-pay | Admitting: Family Medicine

## 2018-08-05 VITALS — BP 118/60 | HR 61 | Resp 16 | Wt 200.0 lb

## 2018-08-05 DIAGNOSIS — M25572 Pain in left ankle and joints of left foot: Secondary | ICD-10-CM

## 2018-08-05 HISTORY — DX: Pain in left ankle and joints of left foot: M25.572

## 2018-08-05 NOTE — Patient Instructions (Signed)
Nice to meet you  Please try ice, tylenol and compression on the ankle  Please try the exercises Please follow up in 3-4 weeks if the pain doesn't improve and we can perform an injection

## 2018-08-05 NOTE — Telephone Encounter (Signed)
Patient's wife called in with c/o "ankle swelling." She says "his left is swollen and has been for 2 weeks. Let me put him on the phone to answer the questions." The patient was placed on the phone and he says "the swelling has been for 2 weeks and is not getting better. It's just my left ankle only, not my foot or my leg." I asked about pain, he says "just say I limp when I walk because it's painful." I asked about injury to the ankle, he denies. I asked about other symptoms, he denies. According to protocol, see PCP within 3 days, no availability with PCP except Same Day slots and the patient asked for a Monday appointment. I advised I was not able to scheduled in the Same Day, but I would call the office to ask if he can be scheduled on Monday. I called and spoke to [place], [facility] who said to let the patient know that he will be referred over to Sports Medicine and ask if he would like to see Dr. Raeford Razor today, I advised the patient and he agreed to see Dr. Raeford Razor today at 1540, care advice given, patient verbalized understanding.   Reason for Disposition . [1] MODERATE pain (e.g., interferes with normal activities, limping) AND [2] present > 3 days  Answer Assessment - Initial Assessment Questions 1. LOCATION: "Which joint is swollen?"     Left ankle 2. ONSET: "When did the swelling start?"     2 weeks ago 3. SIZE: "How large is the swelling?"     Size a little larger than a lemon 4. PAIN: "Is there any pain?" If so, ask: "How bad is it?" (Scale 1-10; or mild, moderate, severe)     When I walk, limp 5. CAUSE: "What do you think caused the swollen joint?"     I don't know 6. OTHER SYMPTOMS: "Do you have any other symptoms?" (e.g., fever, chest pain, difficulty breathing, calf pain)     No 7. PREGNANCY: "Is there any chance you are pregnant?" "When was your last menstrual period?"     N/A  Protocols used: ANKLE SWELLING-A-AH

## 2018-08-05 NOTE — Assessment & Plan Note (Signed)
Pain likely related to degenerative changes of the joint.  - pennsaid samples provided - counseled on compression and ice - if no improvement can consider injection

## 2018-08-05 NOTE — Progress Notes (Signed)
[name] - 67 y.o. male MRN [id number]  Date of birth: 11-15-1950  SUBJECTIVE:  Including CC & ROS.  No chief complaint on file.   [name] is a 67 y.o. male that is presenting with left ankle pain.  The pain is been present for the past 2 months.  He also has some swelling over the lateral malleolus.  He has a history of ankle sprains.  He denies any recent injury.  The pain and swelling are worse at the end of the day.  He has been trying compression with limited improvement.  He has not been taking any medications.  The pain is localized to this area..  Independent review of the left ankle x-ray from 2012 shows degenerative changes of the tibial talar joint   Review of Systems  Constitutional: Negative for fever.  HENT: Negative for congestion.   Respiratory: Negative for cough.   Cardiovascular: Negative for chest pain.  Gastrointestinal: Negative for abdominal pain.  Musculoskeletal: Positive for arthralgias.  Skin: Negative for color change.  Neurological: Negative for weakness.  Hematological: Negative for adenopathy.  Psychiatric/Behavioral: Negative for agitation.    HISTORY: Past Medical, Surgical, Social, and Family History Reviewed & Updated per EMR.   Pertinent Historical Findings include:  Past Medical History:  Diagnosis Date  . Atrial fibrillation ([place])   . Bradycardia   . Carotid artery disease ([place])    Right CEA;  dopplers 6/12:  0-39% bilat ICA  . Coronary artery disease    s/p BMS to RCA 2002; [place] 2008: oLAD 40%, pRCA stent ok with 20%, EF 60%); Myoview scan in March 2011 which was negative for ischemia   . GERD (gastroesophageal reflux disease)   . Hyperlipidemia   . Hypertension   . Myocardial infarction ([place])   . OSA on CPAP     Past Surgical History:  Procedure Laterality Date  . CARDIAC CATHETERIZATION  06/01/2007    EF of 65% -- Nonobstructive CAD with 40% ostial stenosis in the LAD, no significant obstruction in the circumflex artery, 20%  narrowing within the stent in the proximal to mid right coronary and normal LV function  . CARDIAC CATHETERIZATION  02/02/2006   EF of 50%  . CARDIAC CATHETERIZATION  01/19/2002   EF of 60%  . CARDIAC CATHETERIZATION N/A 12/03/2015   Procedure: Left Heart Cath and Coronary Angiography;  Surgeon: Burnell Blanks, MD;  Location: [facility];  Service: Cardiovascular;  Laterality: N/A;  . CAROTID ENDARTERECTOMY  06/26/2002   right  . CORONARY ANGIOPLASTY WITH STENT PLACEMENT  03/08/2000   Bare-metal stent in RCA, in [place]  . KNEE ARTHROSCOPY     right  . MOLE REMOVAL     Prior moles removed    Allergies  Allergen Reactions  . Felodipine Swelling  . Niacin     REACTION: not tolerated.    Family History  Problem Relation Age of Onset  . Heart failure Father   . Heart disease Father   . Peripheral vascular disease Mother        with pacemaker, and had a carotid endarterectomy  . Heart disease Mother   . Hypertension Brother   . Hypertension Brother      Social History   Socioeconomic History  . Marital status: Married    Spouse name: Not on file  . Number of children: 3  . Years of education: 12  . Highest education level: High school graduate  Occupational History  . Occupation:  Architect  Social Needs  . Financial resource strain: Not on file  . Food insecurity:    Worry: Not on file    Inability: Not on file  . Transportation needs:    Medical: Not on file    Non-medical: Not on file  Tobacco Use  . Smoking status: Former Smoker    Packs/day: 1.00    Years: 30.00    Pack years: 30.00    Types: Cigarettes    Last attempt to quit: 10/19/2000    Years since quitting: 17.8  . Smokeless tobacco: Current User    Types: Snuff  . Tobacco comment: Dips snuff.   Substance and Sexual Activity  . Alcohol use: Yes    Comment: Occasional beer  . Drug use: No  . Sexual activity: Not on file  Lifestyle  . Physical activity:    Days per week: Not  on file    Minutes per session: Not on file  . Stress: Not on file  Relationships  . Social connections:    Talks on phone: Not on file    Gets together: Not on file    Attends religious service: Not on file    Active member of club or organization: Not on file    Attends meetings of clubs or organizations: Not on file    Relationship status: Not on file  . Intimate partner violence:    Fear of current or ex partner: Not on file    Emotionally abused: Not on file    Physically abused: Not on file    Forced sexual activity: Not on file  Other Topics Concern  . Not on file  Social History Narrative   Lives with wife in a one story home.  Has 3 children.  Works in Architect.  Education: high school.      PHYSICAL EXAM:  VS: BP 118/60   Pulse 61   Resp 16   Wt 200 lb (90.7 kg)   SpO2 98%   BMI 26.39 kg/m  Physical Exam Gen: NAD, alert, cooperative with exam, well-appearing ENT: normal lips, normal nasal mucosa,  Eye: normal EOM, normal conjunctiva and lids CV:  no edema, +2 pedal pulses   Resp: no accessory muscle use, non-labored,  Skin: no rashes, no areas of induration  Neuro: normal tone, normal sensation to touch Psych:  normal insight, alert and oriented MSK:  Left ankle:  Mild swelling at the lateral malleolus  Normal ROM  Normal strength  Thompson test with plantarflexion  Neurovascularly intact   Limited ultrasound: left ankle:  Degenerative changes of the talotibial joint on the anterior and lateral  Normal appearing peroneal tendons  Normal appearing distal fibula   Summary: significant degenerative changes of the talotibial joint   Ultrasound and interpretation by Clearance Coots, MD       ASSESSMENT & PLAN:   Acute left ankle pain Pain likely related to degenerative changes of the joint.  - pennsaid samples provided - counseled on compression and ice - if no improvement can consider injection

## 2018-08-13 ENCOUNTER — Other Ambulatory Visit: Payer: Self-pay | Admitting: Nurse Practitioner

## 2018-08-15 ENCOUNTER — Ambulatory Visit (INDEPENDENT_AMBULATORY_CARE_PROVIDER_SITE_OTHER): Payer: Managed Care, Other (non HMO) | Admitting: Neurology

## 2018-08-15 DIAGNOSIS — G4733 Obstructive sleep apnea (adult) (pediatric): Secondary | ICD-10-CM | POA: Diagnosis not present

## 2018-08-15 DIAGNOSIS — I25111 Atherosclerotic heart disease of native coronary artery with angina pectoris with documented spasm: Secondary | ICD-10-CM

## 2018-08-15 DIAGNOSIS — G478 Other sleep disorders: Secondary | ICD-10-CM

## 2018-08-15 DIAGNOSIS — Z9862 Peripheral vascular angioplasty status: Secondary | ICD-10-CM

## 2018-08-15 DIAGNOSIS — Z9989 Dependence on other enabling machines and devices: Principal | ICD-10-CM

## 2018-08-15 DIAGNOSIS — Z9861 Coronary angioplasty status: Secondary | ICD-10-CM

## 2018-08-16 ENCOUNTER — Telehealth: Payer: Self-pay | Admitting: Neurology

## 2018-08-16 DIAGNOSIS — G4733 Obstructive sleep apnea (adult) (pediatric): Secondary | ICD-10-CM

## 2018-08-16 DIAGNOSIS — I25111 Atherosclerotic heart disease of native coronary artery with angina pectoris with documented spasm: Secondary | ICD-10-CM

## 2018-08-16 DIAGNOSIS — M79604 Pain in right leg: Secondary | ICD-10-CM

## 2018-08-16 DIAGNOSIS — Z9861 Coronary angioplasty status: Secondary | ICD-10-CM

## 2018-08-16 DIAGNOSIS — G478 Other sleep disorders: Secondary | ICD-10-CM

## 2018-08-16 DIAGNOSIS — M79605 Pain in left leg: Secondary | ICD-10-CM

## 2018-08-16 DIAGNOSIS — Z9989 Dependence on other enabling machines and devices: Principal | ICD-10-CM

## 2018-08-16 DIAGNOSIS — Z9862 Peripheral vascular angioplasty status: Secondary | ICD-10-CM

## 2018-08-16 NOTE — Telephone Encounter (Signed)
Called patient to discuss sleep study results. No answer at this time. LVM for the patient to call back.   

## 2018-08-16 NOTE — Addendum Note (Signed)
Addended by: Larey Seat on: 08/16/2018 12:56 PM   Modules accepted: Orders

## 2018-08-16 NOTE — Telephone Encounter (Signed)
Returned call and I was able to review sleep study with him. I advised pt that Dr. Brett Fairy reviewed their sleep study results and found that pt sleep apnea. Dr. Brett Fairy recommends that pt uses a auto CPAP machine set 7-13 cm water pressure. I reviewed PAP compliance expectations with the pt. Pt is agreeable to starting a CPAP. I advised pt that an order will be sent to a DME, [facility], and [facility] will call the pt within about one week after they file with the pt's insurance. [facility] will show the pt how to use the machine, fit for masks, and troubleshoot the CPAP if needed. A follow up appt was made for insurance purposes with Ward Givens, NP on Jan 24,2020 at 9:30 am. Pt verbalized understanding to arrive 15 minutes early and bring their CPAP. A letter with all of this information in it will be mailed to the pt as a reminder. I verified with the pt that the address we have on file is correct. Pt verbalized understanding of results. Pt had no questions at this time but was encouraged to call back if questions arise. I have sent the order to [facility] and have received confirmation that they have received the order.

## 2018-08-16 NOTE — Addendum Note (Signed)
Addended by: Darleen Crocker on: 08/16/2018 02:59 PM   Modules accepted: Orders

## 2018-08-16 NOTE — Telephone Encounter (Signed)
-----   Message from Larey Seat, MD sent at 08/16/2018 12:56 PM EDT ----- 8 h18  min valid test time . Total Apnea/Hypopnea Index (AHI): 13. 0/h: RDI: 14.0 /h Average Oxygen Saturation: 93 % ; Lowest Oxygen Saturation: 86 %  Total Time in Oxygen Saturation below 89 %: 1.8 minutes  Average Heart Rate: 52 bpm , regular (between 37 and 85 bpm) IMPRESSION: Mild OSA, Moderate snoring no prolonged or severe  hypoxemia. RECOMMENDATION: continue with auto CPAP care, mask of patient's  choice, settings of 7-13 cm water with 1 cm EPR.

## 2018-08-16 NOTE — Procedures (Signed)
[facility] [place], [place] [address] NAME:  [name]. Gearheart                                                             DOB: Aug 31, 1951 MEDICAL RECORD NUMBER [id number]                                            DOS: 08/15/2018   REFERRING PHYSICIAN: [name], NP STUDY PERFORMED: Home Sleep Study on watch pat  HISTORY:  [name] is a 67 y.o. male patient referred by NP [facility] for a transfer of apnea care. The patient was just diagnosed with atrial fibrillation in July 2019, and needed a follow up with sleep medicine.  He had an original sleep study in 02-04-2008 in [place], by Dr. Danton Sewer. The nocturnal polysomnography as interpreted on 04 February 2008 showed an RDI of 5.1/h events per hour, he had a total of 28 so-called respiratory related arousals.  These are usually signs of upper airway resistance as he is reportedly snoring.  He had only 6 apneas for the whole night the events were clearly louder and worse during REM sleep. He also noted that he should be given CPAP treatment given his comorbidity of cardiac disease.  [name] has been a very compliant CPAP user and I was able to review data for the last 365 days.  We reviewed the 30-day time span for compliance assurance and she has been 97% of compliant, with an average user time at night of 9 hours and 18 minutes.  The machine is an AutoSet between 5 and 15 cmH2O pressure with full-time 2 cm EPR setting.  Residual AHI is 0.6 which is an ideal resolution, his pressure at 95th percentile was 10 cmH2O this is well within the current prescribed pressure window BMI:26.0  [place] 0   STUDY RESULTS:  Total Recording Time: 10 hours  4 minutes, 8 h18 min valid test time . Total Apnea/Hypopnea Index (AHI):  13. 0/h:  RDI:  14.0 /h Average Oxygen Saturation: 93 % ; Lowest Oxygen Saturation: 86 %  Total Time in Oxygen Saturation below 89 %: 1.8 minutes  Average Heart Rate:  52 bpm ,  regular (between 37 and  85 bpm) IMPRESSION: Mild OSA, Moderate snoring no prolonged or severe hypoxemia. RECOMMENDATION: continue with auto CPAP care, mask of patient's choice, settings of 7-13 cm water with 1 cm EPR. I certify that I have reviewed the raw data recording prior to the issuance of this report in accordance with the standards of the American Academy of Sleep Medicine (AASM). Larey Seat, M.D.   08-16-2018    Medical Director of [facility] at [facility], accredited by the AASM. Diplomat of the ABPN and ABSM.

## 2018-08-29 ENCOUNTER — Ambulatory Visit (INDEPENDENT_AMBULATORY_CARE_PROVIDER_SITE_OTHER): Payer: 59 | Admitting: Neurology

## 2018-08-29 ENCOUNTER — Encounter: Payer: Self-pay | Admitting: Neurology

## 2018-08-29 VITALS — BP 140/70 | HR 69 | Ht 73.0 in | Wt 201.4 lb

## 2018-08-29 DIAGNOSIS — R292 Abnormal reflex: Secondary | ICD-10-CM

## 2018-08-29 DIAGNOSIS — M79605 Pain in left leg: Secondary | ICD-10-CM | POA: Diagnosis not present

## 2018-08-29 DIAGNOSIS — M79604 Pain in right leg: Secondary | ICD-10-CM

## 2018-08-29 MED ORDER — TIZANIDINE HCL 2 MG PO TABS: ORAL_TABLET | ORAL | 3 refills | Status: DC

## 2018-08-29 NOTE — Patient Instructions (Addendum)
MRI lumbar spine without contrast  Start tizandine 2mg  at bedtime as needed, if it doesn't make you too sleepy, you can take twice daily.  Start physical therapy  We will call you with the results

## 2018-08-29 NOTE — Progress Notes (Signed)
Follow-up Visit   Date: 08/29/18    [name] MRN: [id number] DOB: Sep 12, 1951   Interim History: [name] is a 67 y.o. right-handed Caucasian male with CAD s/p PCI, RCEA, GERD, hyperlipidemia, hypertension, bradycardia, OSA, and BPH returning to the clinic for follow-up of bilateral leg pain.  The patient was accompanied to the clinic by wife who also provides collateral information.    History of present illness: Starting around November 2018, he began having achy soreness of the calf.  Since this time, pain is constant present at rest and worse with exertion. He works full-time in [employer] and is on his feet 10-12 hours per day.  He is able to continue his work and does not take any breaks with prolonged standing/walking; however, notes that if he rests, it takes him several attempts to stand back up due to stiffness.  Sometimes, he has transient numbness over the tips of the toes. He denies low back pain. Around the same time, he noticed discoloration of the toes up to his ankles.  This resolved spontaneously within 1 day.  He has had an extensive workup for leg pain and discoloration by Dr. Scot Dock, vascular surgery, with normal arterial doppler studies, ABI's, and unremarkable CT angiogram.  He was started on gabapentin 331m at bedtime which provided relief for 1-2 days, so it was increased 3019mto twice daily which again worked for a few days.  NSAIDs also provide relief of leg pain.   UPDATE 08/29/2018:  He here for follow-up visit.  Leg pain was doing well over the summer, but over the past month, his achy leg pain returned.  He has a lot of stiffness and if he has been sitting for a long time, it takes several efforts to get up.  He does not have numbness/tingling, cramps, or weakness.  Rest can eases his pain, but not completely alleviate it. He does not have low back pain.  He did not get his MRI lumbar spine done because of issues with going back into afib. He has been on  short-term disability since July and is looking into full time disability.   Medications:  Current Outpatient Medications on File Prior to Visit  Medication Sig Dispense Refill  . amLODipine (NORVASC) 10 MG tablet Take 1 tablet by mouth daily (Patient taking differently: Take 10 mg by mouth daily. ) 90 tablet 1  . apixaban (ELIQUIS) 5 MG TABS tablet Take 1 tablet (5 mg total) by mouth 2 (two) times daily. 60 tablet 11  . aspirin 81 MG tablet Take 81 mg by mouth daily.      . Marland Kitchen complex vitamins capsule Take 1 capsule by mouth daily.    . Marland Kitchenoxazosin (CARDURA) 2 MG tablet Take 1 tablet (2 mg total) by mouth daily. 180 tablet 1  . ezetimibe (ZETIA) 10 MG tablet Take 10 mg by mouth daily.    . finasteride (PROSCAR) 5 MG tablet Take 5 mg by mouth daily.     . hydrochlorothiazide (HYDRODIURIL) 50 MG tablet Take 0.5 tablets (25 mg total) by mouth daily. 45 tablet 1  . isosorbide mononitrate (IMDUR) 30 MG 24 hr tablet Take 1 tablet (30 mg total) by mouth daily. 90 tablet 1  . metoprolol tartrate (LOPRESSOR) 25 MG tablet Take 1 tablet (25 mg total) by mouth daily as needed (as needed for palpitations/afib.). 60 tablet 11  . nitroGLYCERIN (NITROSTAT) 0.4 MG SL tablet Place 1 tablet (0.4 mg total) under the tongue every 5 (  five) minutes as needed. Call 9-1-1 if need more than 2. 25 tablet 0  . pantoprazole (PROTONIX) 40 MG tablet Take 1 tablet by mouth every morning. (Patient taking differently: Take 40 mg by mouth daily. ) 90 tablet 3  . potassium chloride (K-DUR,KLOR-CON) 10 MEQ tablet TAKE 1 TABLET DAILY (Patient taking differently: Take 10 mEq by mouth at bedtime. ) 90 tablet 0  . potassium chloride (K-DUR,KLOR-CON) 10 MEQ tablet TAKE 1 TABLET(10 MEQ) BY MOUTH DAILY 30 tablet 2  . ramipril (ALTACE) 10 MG capsule TAKE 1 CAPSULE TWICE A DAY (Patient taking differently: Take 10 mg by mouth daily. ) 180 capsule 1  . rosuvastatin (CRESTOR) 40 MG tablet Take 1 tablet (40 mg total) by mouth daily. 90 tablet 1    No current facility-administered medications on file prior to visit.     Allergies:  Allergies  Allergen Reactions  . Felodipine Swelling  . Niacin     REACTION: not tolerated.    Review of Systems:  CONSTITUTIONAL: No fevers, chills, night sweats, or weight loss.  EYES: No visual changes or eye pain ENT: No hearing changes.  No history of nose bleeds.   RESPIRATORY: No cough, wheezing and shortness of breath.   CARDIOVASCULAR: Negative for chest pain, and palpitations.   GI: Negative for abdominal discomfort, blood in stools or black stools.  No recent change in bowel habits.   GU:  No history of incontinence.   MUSCLOSKELETAL: No history of joint pain or swelling.  +myalgias.   SKIN: Negative for lesions, rash, and itching.   ENDOCRINE: Negative for cold or heat intolerance, polydipsia or goiter.   PSYCH:  No depression or anxiety symptoms.   NEURO: As Above.   Vital Signs:  BP 140/70   Pulse 69   Ht 6' 1" (1.854 m)   Wt 201 lb 6 oz (91.3 kg)   SpO2 97%   BMI 26.57 kg/m   General Medical Exam:   General:  Well appearing, comfortable  Eyes/ENT: see cranial nerve examination.   Neck: No masses appreciated.  Full range of motion without tenderness.  No carotid bruits. Respiratory:  Clear to auscultation, good air entry bilaterally.   Cardiac:  Regular rate and rhythm, no murmur.   Ext:  No edema  Neurological Exam: MENTAL STATUS including orientation to time, place, person, recent and remote memory, attention span and concentration, language, and fund of knowledge is normal.  Speech is not dysarthric.  CRANIAL NERVES:  Face is symmetric.   MOTOR:  Motor strength is 5/5 in all extremities.  No pronator drift.  Tone is normal.    MSRs:  Right                                                                 Left brachioradialis 2+  brachioradialis 2+  biceps 2+  biceps 2+  triceps 2+  triceps 2+  patellar 1+  patellar 2+  ankle jerk 1+  ankle jerk 1+    SENSORY:  Intact to vibration at the ankles bilaterally.  COORDINATION/GAIT:  Normal finger-to- nose-finger and heel-to-shin.  Intact rapid alternating movements bilaterally.  Gait narrow based and stable.   Data: Labs 04/01/2018:  ESR 11, CK 112, copper 90, HbA1c 6.2, folate > 24  IMPRESSION/PLAN:  Bilateral leg pain, characterized as achy and stiffness.  I discussed that his last visit, that I'm not certain whether his posterior thigh pain is due to nerve pathology, as he does not have radicular pain, numbness/tingling, or myelopathic findings on exam.  With him getting relief with rest, neurogenic claudication is a possibility.  His patella jerks are asymmetrical, suggesting either the right knee jerk is reduced due to neuropathy vs. radiculopathy or the left is brisk due to canal stenosis.  Either way, MRI lumbar spine is needed to evaluate for structural pathology of the lumbar spine.  For his pain, I will offer a trial of tizanidine 39m twice daily as needed  Start physical therapy for leg stretching  He has disability paperwork which I requested that he ask his PCP to complete, as the nature of his symptoms is still not clear whether it is primary nerve condition  The next step is NCS/EMG of the legs, if imaging is inconclusive   Thank you for allowing me to participate in patient's care.  If I can answer any additional questions, I would be pleased to do so.    Sincerely,    Donika K. PPosey Pronto DO

## 2018-09-02 ENCOUNTER — Other Ambulatory Visit: Payer: Self-pay | Admitting: Nurse Practitioner

## 2018-09-02 ENCOUNTER — Telehealth: Payer: Self-pay | Admitting: Physician Assistant

## 2018-09-02 MED ORDER — EZETIMIBE 10 MG PO TABS: 10.0000 mg | ORAL_TABLET | Freq: Every day | ORAL | 0 refills | Status: DC

## 2018-09-02 NOTE — Telephone Encounter (Signed)
Requested medication (s) are due for refill today: yes  Requested medication (s) are on the active medication list: yes  Last refill:  07/14/18  Future visit scheduled: no   Notes to clinic:  Historical medication   Requested Prescriptions  Pending Prescriptions Disp Refills   ezetimibe (ZETIA) 10 MG tablet      Sig: Take 1 tablet (10 mg total) by mouth daily.     Cardiovascular:  Antilipid - Sterol Transport Inhibitors Failed - 09/02/2018  1:52 PM      Failed - HDL in normal range and within 360 days    HDL  Date Value Ref Range Status  05/31/2018 30 (L) >40 mg/dL Final         Passed - Total Cholesterol in normal range and within 360 days    Cholesterol  Date Value Ref Range Status  05/31/2018 114 0 - 200 mg/dL Final         Passed - LDL in normal range and within 360 days    LDL Cholesterol  Date Value Ref Range Status  05/31/2018 62 0 - 99 mg/dL Final    Comment:           Total Cholesterol/HDL:CHD Risk Coronary Heart Disease Risk Table                     Men   Women  1/2 Average Risk   3.4   3.3  Average Risk       5.0   4.4  2 X Average Risk   9.6   7.1  3 X Average Risk  23.4   11.0        Use the calculated Patient Ratio above and the CHD Risk Table to determine the patient's CHD Risk.        ATP III CLASSIFICATION (LDL):  <100     mg/dL   Optimal  100-129  mg/dL   Near or Above                    Optimal  130-159  mg/dL   Borderline  160-189  mg/dL   High  >190     mg/dL   Very High Performed at [facility] [address], [place], [place] [address]          Passed - Triglycerides in normal range and within 360 days    Triglycerides  Date Value Ref Range Status  05/31/2018 108 <150 mg/dL Final         Passed - Valid encounter within last 12 months    Recent Outpatient Visits          4 weeks ago Acute left ankle pain   [place], Enid Baas, MD   2 months ago [place], Marvis Repress, FNP   3 months ago Irregular heart beat   [place], Delphia Grates, NP   3 months ago Other fatigue   [place], FNP   4 months ago Other fatigue   [place], Delphia Grates, NP

## 2018-09-02 NOTE — Telephone Encounter (Signed)
Copied from [place] [id number]. Topic: Quick Communication - Rx Refill/Question >> Sep 02, 2018 12:30 PM Selinda Flavin B, NT wrote: Medication: ezetimibe (ZETIA) 10 MG tablet   Has the patient contacted their pharmacy? Yes.   (Agent: If no, request that the patient contact the pharmacy for the refill.) (Agent: If yes, when and what did the pharmacy advise?)  Preferred Pharmacy (with phone number or street name): [facility], [address]  Agent: Please be advised that RX refills may take up to 3 business days. We ask that you follow-up with your pharmacy.

## 2018-09-05 ENCOUNTER — Other Ambulatory Visit: Payer: Self-pay | Admitting: *Deleted

## 2018-09-05 MED ORDER — PANTOPRAZOLE SODIUM 40 MG PO TBEC: DELAYED_RELEASE_TABLET | ORAL | 3 refills | Status: DC

## 2018-09-13 ENCOUNTER — Telehealth: Payer: Self-pay | Admitting: Neurology

## 2018-09-13 NOTE — Telephone Encounter (Signed)
Patient given results and instructions.  EMG scheduled.

## 2018-09-13 NOTE — Telephone Encounter (Signed)
MRI lumbar spine wo contrast 09/10/2018 performed at [facility], [place]):  No impingement to explain leg pain.  Facet degeneration at L5-S1, worse on the left, with slight anterolisthesis.  Please inform patient that his MRI does not show any disc protrusion or nerve impingement, suggesting that his leg pain is not stemming from his back.  Next step is NCS/EMG of the legs to see if there is a peripheral nerve problem.  Joanna Borawski K. Posey Pronto, DO

## 2018-09-21 ENCOUNTER — Encounter: Payer: Self-pay | Admitting: Neurology

## 2018-09-21 ENCOUNTER — Other Ambulatory Visit: Payer: Self-pay | Admitting: *Deleted

## 2018-09-21 DIAGNOSIS — R200 Anesthesia of skin: Secondary | ICD-10-CM

## 2018-09-27 ENCOUNTER — Ambulatory Visit (INDEPENDENT_AMBULATORY_CARE_PROVIDER_SITE_OTHER): Payer: Medicare Other | Admitting: Neurology

## 2018-09-27 DIAGNOSIS — R200 Anesthesia of skin: Secondary | ICD-10-CM

## 2018-09-27 DIAGNOSIS — M79604 Pain in right leg: Secondary | ICD-10-CM

## 2018-09-27 DIAGNOSIS — M79605 Pain in left leg: Principal | ICD-10-CM

## 2018-09-27 NOTE — Procedures (Signed)
[facility]  [address], [address]  [place], [place] [address] Tel: [phone] Fax:  [phone] Test Date:  09/27/2018  Patient: [name] DOB: March 24, 1951 Physician: Narda Amber, DO  Sex: Male Height: 6\' 1"  Ref Phys: Narda Amber, DO  ID#: [id number] Temp: 33.0C Technician:    Patient Complaints: This is a 67 year old man referred for evaluation of bilateral leg pain and stiffness.  NCV & EMG Findings: Electrodiagnostic testing of the right lower extremity and additional studies of the left shows: 1. Bilateral sural and superficial peroneal sensory responses are within normal limits. 2. Bilateral peroneal and tibial motor responses are within normal limits. 3. Bilateral tibial H reflex studies are within normal limits. 4. There is no evidence of active or chronic motor axonal changes affecting any of the tested muscles.  Motor unit configuration and recruitment pattern is within normal limits.  Proximal and deep muscles were not tested as the patient is on anticoagulation therapy.  Impression: This is a normal study of the lower extremities.  In particular, there is no evidence of a sensorimotor polyneuropathy or lumbosacral radiculopathy.     ___________________________ Narda Amber, DO    Nerve Conduction Studies Anti Sensory Summary Table   Site NR Peak (ms) Norm Peak (ms) P-T Amp (V) Norm P-T Amp  Left Sup Peroneal Anti Sensory (Ant Lat Mall)  33C  12 cm    2.6 <4.6 7.2 >3  Right Sup Peroneal Anti Sensory (Ant Lat Mall)  33C  12 cm    2.5 <4.6 7.1 >3  Left Sural Anti Sensory (Lat Mall)  33C  Calf    2.5 <4.6 17.6 >3  Right Sural Anti Sensory (Lat Mall)  33C  Calf    2.6 <4.6 17.6 >3   Motor Summary Table   Site NR Onset (ms) Norm Onset (ms) O-P Amp (mV) Norm O-P Amp Site1 Site2 Delta-0 (ms) Dist (cm) Vel (m/s) Norm Vel (m/s)  Left Peroneal Motor (Ext Dig Brev)  33C  Ankle    4.8 <6.0 4.8 >2.5 B Fib Ankle 8.2 39.0 48 >40  B Fib    13.0   4.2  Poplt B Fib 1.6 10.0 63 >40  Poplt    14.6  3.8         Right Peroneal Motor (Ext Dig Brev)  33C  Ankle    4.5 <6.0 2.7 >2.5 B Fib Ankle 8.5 40.0 47 >40  B Fib    13.0  2.5  Poplt B Fib 1.4 9.0 64 >40  Poplt    14.4  2.4         Left Tibial Motor (Abd Hall Brev)  33C  Ankle    3.4 <6.0 9.4 >4 Knee Ankle 9.7 40.0 41 >40  Knee    13.1  8.0         Right Tibial Motor (Abd Hall Brev)  33C  Ankle    3.4 <6.0 9.5 >4 Knee Ankle 10.2 42.0 41 >40  Knee    13.6  7.1          H Reflex Studies   NR H-Lat (ms) Lat Norm (ms) L-R H-Lat (ms)  Left Tibial (Gastroc)  33C     34.01 <35 0.00  Right Tibial (Gastroc)  33C     34.01 <35 0.00   EMG   Side Muscle Ins Act Fibs Psw Fasc Number Recrt Dur Dur. Amp Amp. Poly Poly. Comment  Right Gastroc Nml Nml Nml Nml Nml Nml Nml Nml  Nml Nml Nml Nml N/A  Right AntTibialis Nml Nml Nml Nml Nml Nml Nml Nml Nml Nml Nml Nml N/A  Right BicepsFemS Nml Nml Nml Nml Nml Nml Nml Nml Nml Nml Nml Nml N/A  Right RectFemoris Nml Nml Nml Nml Nml Nml Nml Nml Nml Nml Nml Nml N/A  Left RectFemoris Nml Nml Nml Nml Nml Nml Nml Nml Nml Nml Nml Nml N/A  Left BicepsFemS Nml Nml Nml Nml Nml Nml Nml Nml Nml Nml Nml Nml N/A  Left AntTibialis Nml Nml Nml Nml Nml Nml Nml Nml Nml Nml Nml Nml N/A  Left Gastroc Nml Nml Nml Nml Nml Nml Nml Nml Nml Nml Nml Nml N/A      Waveforms:

## 2018-10-03 ENCOUNTER — Telehealth: Payer: Self-pay | Admitting: *Deleted

## 2018-10-03 NOTE — Telephone Encounter (Signed)
-----   Message from Alda Berthold, DO sent at 09/29/2018 10:20 AM EST ----- Please inform patient that his nerve testing is normal - nothing that is pointing nerve or muscle injury causing his pain.  We can look higher into his mid-back with MRI thoracic spine to nerve impingement at this level and/or we can refer him to Sports Medicine to see if there is musculoskeletal problem causing his discomfort.  Everything looks good from a neurological standpoint.  Thanks.

## 2018-10-03 NOTE — Telephone Encounter (Signed)
Left message giving patient results and options for next step.  Requested for him to call me if he would like MRI or referral to sports medicine.

## 2018-10-06 ENCOUNTER — Telehealth: Payer: Self-pay | Admitting: *Deleted

## 2018-10-06 NOTE — Telephone Encounter (Signed)
Fax received from [facility].  They received our referral but the patient said that he was receiving care elsewhere.

## 2018-10-07 ENCOUNTER — Other Ambulatory Visit: Payer: Self-pay | Admitting: *Deleted

## 2018-10-07 DIAGNOSIS — I10 Essential (primary) hypertension: Secondary | ICD-10-CM

## 2018-10-07 MED ORDER — ISOSORBIDE MONONITRATE ER 30 MG PO TB24: 30.0000 mg | ORAL_TABLET | Freq: Every day | ORAL | 1 refills | Status: DC

## 2018-10-07 MED ORDER — RAMIPRIL 10 MG PO CAPS: ORAL_CAPSULE | ORAL | 1 refills | Status: DC

## 2018-10-07 MED ORDER — POTASSIUM CHLORIDE CRYS ER 10 MEQ PO TBCR: EXTENDED_RELEASE_TABLET | ORAL | 1 refills | Status: DC

## 2018-10-07 MED ORDER — EZETIMIBE 10 MG PO TABS: 10.0000 mg | ORAL_TABLET | Freq: Every day | ORAL | 1 refills | Status: DC

## 2018-10-07 MED ORDER — HYDROCHLOROTHIAZIDE 50 MG PO TABS: 25.0000 mg | ORAL_TABLET | Freq: Every day | ORAL | 1 refills | Status: DC

## 2018-10-07 MED ORDER — ROSUVASTATIN CALCIUM 40 MG PO TABS: 40.0000 mg | ORAL_TABLET | Freq: Every day | ORAL | 1 refills | Status: DC

## 2018-10-07 MED ORDER — PANTOPRAZOLE SODIUM 40 MG PO TBEC: DELAYED_RELEASE_TABLET | ORAL | 1 refills | Status: DC

## 2018-10-07 MED ORDER — AMLODIPINE BESYLATE 10 MG PO TABS: ORAL_TABLET | ORAL | 1 refills | Status: DC

## 2018-10-07 MED ORDER — DOXAZOSIN MESYLATE 2 MG PO TABS: 2.0000 mg | ORAL_TABLET | Freq: Every day | ORAL | 1 refills | Status: DC

## 2018-10-13 ENCOUNTER — Encounter: Payer: Medicare Other | Admitting: Neurology

## 2018-10-13 ENCOUNTER — Encounter

## 2018-10-20 ENCOUNTER — Other Ambulatory Visit: Payer: Self-pay

## 2018-10-20 MED ORDER — APIXABAN 5 MG PO TABS: 5.0000 mg | ORAL_TABLET | Freq: Two times a day (BID) | ORAL | 0 refills | Status: DC

## 2018-11-11 ENCOUNTER — Ambulatory Visit: Payer: Self-pay | Admitting: Adult Health

## 2018-11-14 ENCOUNTER — Telehealth: Payer: Self-pay | Admitting: Nurse Practitioner

## 2018-11-14 DIAGNOSIS — D3500 Benign neoplasm of unspecified adrenal gland: Secondary | ICD-10-CM

## 2018-11-14 NOTE — Telephone Encounter (Signed)
-----   Message from Lance Sell, NP sent at 04/26/2018  2:39 PM EDT ----- Incidental left adrenal adenoma on 11/10/17 CT ANGIO- need to repeat in 1 year in Jan 2020

## 2018-11-14 NOTE — Telephone Encounter (Signed)
Its time for the 1 year CT to follow up on the spot on adrenal gland that was seen on last januarys CT scan, this order has been placed

## 2018-11-15 NOTE — Telephone Encounter (Signed)
Pt informed of below.  

## 2018-11-18 ENCOUNTER — Telehealth: Payer: Self-pay | Admitting: *Deleted

## 2018-11-18 DIAGNOSIS — D3500 Benign neoplasm of unspecified adrenal gland: Secondary | ICD-10-CM

## 2018-11-18 NOTE — Telephone Encounter (Signed)
-----   Message from Octavio Manns sent at 11/18/2018  4:13 PM EST ----- Pt will need to come in for some labs before his CT scan. Can you please put a BMET order in? I'll let pt know.  Thanks Cecille Rubin

## 2018-12-05 ENCOUNTER — Other Ambulatory Visit (INDEPENDENT_AMBULATORY_CARE_PROVIDER_SITE_OTHER): Payer: Medicare Other

## 2018-12-05 DIAGNOSIS — D3500 Benign neoplasm of unspecified adrenal gland: Secondary | ICD-10-CM | POA: Diagnosis not present

## 2018-12-05 LAB — BASIC METABOLIC PANEL
BUN: 17 mg/dL (ref 6–23)
CO2: 30 mEq/L (ref 19–32)
Calcium: 9.4 mg/dL (ref 8.4–10.5)
Chloride: 105 mEq/L (ref 96–112)
Creatinine, Ser: 1.24 mg/dL (ref 0.40–1.50)
GFR: 58.31 mL/min — ABNORMAL LOW (ref >60.00)
Glucose, Bld: 90 mg/dL (ref 70–99)
Potassium: 4.3 mEq/L (ref 3.5–5.1)
Sodium: 144 mEq/L (ref 135–145)

## 2018-12-07 ENCOUNTER — Encounter: Payer: Self-pay | Admitting: Adult Health

## 2018-12-12 ENCOUNTER — Encounter: Payer: Self-pay | Admitting: Adult Health

## 2018-12-12 ENCOUNTER — Ambulatory Visit: Payer: Medicare Other | Admitting: Adult Health

## 2018-12-12 VITALS — BP 126/75 | HR 72 | Wt 208.0 lb

## 2018-12-12 DIAGNOSIS — G4733 Obstructive sleep apnea (adult) (pediatric): Secondary | ICD-10-CM

## 2018-12-12 DIAGNOSIS — Z9989 Dependence on other enabling machines and devices: Secondary | ICD-10-CM | POA: Diagnosis not present

## 2018-12-12 NOTE — Progress Notes (Signed)
PATIENT: [name] DOB: 1951-10-03  REASON FOR VISIT: follow up HISTORY FROM: patient  HISTORY OF PRESENT ILLNESS: [name] is a 68 y.o. male with PMH of CAD s/p PCI, RCEA, GERD, HLD, and HTN who is being followed in this office for OSA on CPAP management.  He was initially evaluated by Dr. Brett Fairy on 07/14/2018 with prior diagnosis of OSA on CPAP and due to recent diagnosis of atrial fibrillation, was referred for ongoing apnea management.  He underwent HST which confirmed mild OSA with AHI at 13 and recommended continuation of auto CPAP with settings of 7-13 cmH2O with 1 cm EPR.  He is being seen today for initial CPAP follow-up.  Download report from 11/08/2018- 12/07/2018 shows 29 out of 30 days usage for 97% compliance in all 29 days greater than 4 hours usage.  Average usage days 8 hours and 45 minutes with residual AHI 0.7.  Leaks in the 95th percentile 29.1 L/min.  Pressure in the 95th percentile 11.3 cm H2O on a set pressure of 7 to 13 cm H2O with EPR 1. He does report that it is in his machine that holds the tubing onto his mask consistently will pop off while he is sleeping and this is most likely the cause for his leak.  Otherwise, he is tolerating well without any complaints.  He continues on Eliquis and aspirin for atrial fibrillation along with Crestor for secondary stroke prevention.  He returns today for evaluation     REVIEW OF SYSTEMS: Out of a complete 14 system review of symptoms, the patient complains only of the following symptoms, apnea and ankle swelling and all other reviewed systems are negative.    ALLERGIES: Allergies  Allergen Reactions  . Felodipine Swelling  . Niacin     REACTION: not tolerated.    HOME MEDICATIONS: Outpatient Medications Prior to Visit  Medication Sig Dispense Refill  . amLODipine (NORVASC) 10 MG tablet Take 1 tablet by mouth daily 90 tablet 1  . apixaban (ELIQUIS) 5 MG TABS tablet Take 1 tablet (5 mg total) by mouth 2 (two) times  daily. 180 tablet 0  . aspirin 81 MG tablet Take 81 mg by mouth daily.      Marland Kitchen b complex vitamins capsule Take 1 capsule by mouth daily.    Marland Kitchen doxazosin (CARDURA) 2 MG tablet Take 1 tablet (2 mg total) by mouth daily. 180 tablet 1  . ezetimibe (ZETIA) 10 MG tablet Take 1 tablet (10 mg total) by mouth daily. 90 tablet 1  . finasteride (PROSCAR) 5 MG tablet Take 5 mg by mouth daily.     . hydrochlorothiazide (HYDRODIURIL) 50 MG tablet Take 0.5 tablets (25 mg total) by mouth daily. 45 tablet 1  . isosorbide mononitrate (IMDUR) 30 MG 24 hr tablet Take 1 tablet (30 mg total) by mouth daily. 90 tablet 1  . metoprolol tartrate (LOPRESSOR) 25 MG tablet Take 1 tablet (25 mg total) by mouth daily as needed (as needed for palpitations/afib.). 60 tablet 11  . nitroGLYCERIN (NITROSTAT) 0.4 MG SL tablet Place 1 tablet (0.4 mg total) under the tongue every 5 (five) minutes as needed. Call 9-1-1 if need more than 2. 25 tablet 0  . pantoprazole (PROTONIX) 40 MG tablet Take 1 tablet by mouth every morning. 90 tablet 1  . potassium chloride (K-DUR,KLOR-CON) 10 MEQ tablet TAKE 1 TABLET DAILY (Patient taking differently: Take 10 mEq by mouth at bedtime. ) 90 tablet 0  . potassium chloride (K-DUR,KLOR-CON) 10  MEQ tablet TAKE 1 TABLET(10 MEQ) BY MOUTH DAILY 90 tablet 1  . ramipril (ALTACE) 10 MG capsule TAKE 1 CAPSULE TWICE A DAY 180 capsule 1  . rosuvastatin (CRESTOR) 40 MG tablet Take 1 tablet (40 mg total) by mouth daily. 90 tablet 1  . tiZANidine (ZANAFLEX) 2 MG tablet Take one tablet at bedtime as needed for pain, if tolerating, ok to take twice daily as needed 45 tablet 3   No facility-administered medications prior to visit.     PAST MEDICAL HISTORY: Past Medical History:  Diagnosis Date  . Atrial fibrillation ([place])   . Bradycardia   . Carotid artery disease ([place])    Right CEA;  dopplers 6/12:  0-39% bilat ICA  . Coronary artery disease    s/p BMS to RCA 2002; [place] 2008: oLAD 40%, pRCA stent ok with 20%, EF  60%); Myoview scan in March 2011 which was negative for ischemia   . GERD (gastroesophageal reflux disease)   . Hyperlipidemia   . Hypertension   . Myocardial infarction ([place])   . OSA on CPAP     PAST SURGICAL HISTORY: Past Surgical History:  Procedure Laterality Date  . CARDIAC CATHETERIZATION  06/01/2007    EF of 65% -- Nonobstructive CAD with 40% ostial stenosis in the LAD, no significant obstruction in the circumflex artery, 20% narrowing within the stent in the proximal to mid right coronary and normal LV function  . CARDIAC CATHETERIZATION  02/02/2006   EF of 50%  . CARDIAC CATHETERIZATION  01/19/2002   EF of 60%  . CARDIAC CATHETERIZATION N/A 12/03/2015   Procedure: Left Heart Cath and Coronary Angiography;  Surgeon: Burnell Blanks, MD;  Location: [facility];  Service: Cardiovascular;  Laterality: N/A;  . CAROTID ENDARTERECTOMY  06/26/2002   right  . CORONARY ANGIOPLASTY WITH STENT PLACEMENT  03/08/2000   Bare-metal stent in RCA, in [place]  . KNEE ARTHROSCOPY     right  . MOLE REMOVAL     Prior moles removed    FAMILY HISTORY: Family History  Problem Relation Age of Onset  . Heart failure Father   . Heart disease Father   . Peripheral vascular disease Mother        with pacemaker, and had a carotid endarterectomy  . Heart disease Mother   . Hypertension Brother   . Hypertension Brother     SOCIAL HISTORY: Social History   Socioeconomic History  . Marital status: Married    Spouse name: Not on file  . Number of children: 3  . Years of education: 86  . Highest education level: High school graduate  Occupational History  . Occupation: Barista  . Financial resource strain: Not on file  . Food insecurity:    Worry: Not on file    Inability: Not on file  . Transportation needs:    Medical: Not on file    Non-medical: Not on file  Tobacco Use  . Smoking status: Former Smoker    Packs/day: 1.00    Years: 30.00     Pack years: 30.00    Types: Cigarettes    Last attempt to quit: 10/19/2000    Years since quitting: 18.1  . Smokeless tobacco: Current User    Types: Snuff  . Tobacco comment: Dips snuff.   Substance and Sexual Activity  . Alcohol use: Yes    Comment: Occasional beer  . Drug use: No  . Sexual activity: Not on file  Lifestyle  .  Physical activity:    Days per week: Not on file    Minutes per session: Not on file  . Stress: Not on file  Relationships  . Social connections:    Talks on phone: Not on file    Gets together: Not on file    Attends religious service: Not on file    Active member of club or organization: Not on file    Attends meetings of clubs or organizations: Not on file    Relationship status: Not on file  . Intimate partner violence:    Fear of current or ex partner: Not on file    Emotionally abused: Not on file    Physically abused: Not on file    Forced sexual activity: Not on file  Other Topics Concern  . Not on file  Social History Narrative   Lives with wife in a one story home.  Has 3 children.  Works in Architect.  Education: high school.       PHYSICAL EXAM  Vitals:   12/12/18 1455  BP: 126/75  Pulse: 72  Weight: 208 lb (94.3 kg)   Body mass index is 27.44 kg/m.  Generalized: Well developed, pleasant middle-aged Caucasian male, in no acute distress  Neck circumference 17.5 inches  Neurological examination  Mentation: Alert oriented to time, place, history taking. Follows all commands speech and language fluent Cranial nerve II-XII: Pupils were equal round reactive to light. Extraocular movements were full, visual field were full on confrontational test. Facial sensation and strength were normal. Uvula tongue midline. Head turning and shoulder shrug  were normal and symmetric. Motor: The motor testing reveals 5 over 5 strength of all 4 extremities. Good symmetric motor tone is noted throughout.  Sensory: Sensory testing is intact to soft  touch on all 4 extremities. No evidence of extinction is noted.  Coordination: Cerebellar testing reveals good finger-nose-finger and heel-to-shin bilaterally.  Gait and station: Gait is normal. Tandem gait is normal. Romberg is negative. No drift is seen.  Reflexes: Deep tendon reflexes are symmetric and normal bilaterally.   DIAGNOSTIC DATA (LABS, IMAGING, TESTING) - I reviewed patient records, labs, notes, testing and imaging myself where available.  Lab Results  Component Value Date   WBC 4.1 07/20/2018   HGB 13.6 07/20/2018   HCT 40.0 07/20/2018   MCV 89.5 07/20/2018   PLT 154 07/20/2018      Component Value Date/Time   NA 144 12/05/2018 1010   K 4.3 12/05/2018 1010   CL 105 12/05/2018 1010   CO2 30 12/05/2018 1010   GLUCOSE 90 12/05/2018 1010   BUN 17 12/05/2018 1010   CREATININE 1.24 12/05/2018 1010   CALCIUM 9.4 12/05/2018 1010   PROT 6.4 (L) 05/30/2018 1738   ALBUMIN 3.9 05/30/2018 1738   AST 30 05/30/2018 1738   ALT 44 05/30/2018 1738   ALKPHOS 49 05/30/2018 1738   BILITOT 1.4 (H) 05/30/2018 1738   GFRNONAA >60 06/08/2018 1044   GFRAA >60 06/08/2018 1044   Lab Results  Component Value Date   CHOL 114 05/31/2018   HDL 30 (L) 05/31/2018   LDLCALC 62 05/31/2018   TRIG 108 05/31/2018   CHOLHDL 3.8 05/31/2018   Lab Results  Component Value Date   HGBA1C 6.2 04/01/2018   Lab Results  Component Value Date   VITAMINB12 738 03/04/2018   Lab Results  Component Value Date   TSH 1.75 03/04/2018      ASSESSMENT AND PLAN 68 y.o. year old male  has a past medical history of Atrial fibrillation ([place]), Bradycardia, Carotid artery disease ([place]), Coronary artery disease, GERD (gastroesophageal reflux disease), Hyperlipidemia, Hypertension, Myocardial infarction ([place]), and OSA on CPAP. here with initial CPAP follow-up  Continue use of CPAP on current settings due to adequate OSA management.  Order placed for DME company [employer] to provide patient with new  attachment between his mask and tubing which is most likely the culprit for his leaks.  Encouraged continued compliance and to notify us with any questions or concerns.  He will follow-up in 6 months or call earlier if needed  Greater than 50% of this 15-minute visit was spent reviewing his download report     Venancio Poisson, MSN, AGNP-BC 12/12/2018, 3:24 PM [facility] [address], [place], [place] [address] [phone]

## 2018-12-12 NOTE — Patient Instructions (Addendum)
Your Plan:  Continue current settings on your CPAP machine - we will reach out to [employer] in order to obtain new equipment   Follow up in 6 months or call earlier if needed      Thank you for coming to see [place] at [facility]. I hope we have been able to provide you high quality care today.  You may receive a patient satisfaction survey over the next few weeks. We would appreciate your feedback and comments so that we may continue to improve ourselves and the health of our patients.

## 2018-12-13 ENCOUNTER — Ambulatory Visit (INDEPENDENT_AMBULATORY_CARE_PROVIDER_SITE_OTHER)
Admission: RE | Admit: 2018-12-13 | Discharge: 2018-12-13 | Disposition: A | Discharge status: Home / Self Care | Payer: Medicare Other | Source: Ambulatory Visit | Attending: Nurse Practitioner | Admitting: Nurse Practitioner

## 2018-12-13 ENCOUNTER — Other Ambulatory Visit: Payer: Self-pay | Admitting: Nurse Practitioner

## 2018-12-13 DIAGNOSIS — D3500 Benign neoplasm of unspecified adrenal gland: Secondary | ICD-10-CM | POA: Diagnosis not present

## 2018-12-14 NOTE — Progress Notes (Signed)
I agree with the above plan 

## 2018-12-21 ENCOUNTER — Telehealth: Payer: Self-pay | Admitting: Cardiology

## 2018-12-21 ENCOUNTER — Other Ambulatory Visit: Payer: Self-pay | Admitting: Cardiology

## 2018-12-21 NOTE — Telephone Encounter (Signed)
Deb, called from [place] [place] place of employment wanting to speak to a nurse in regards to when Keval is able to return to work. She stated it was a complicated matter.

## 2018-12-21 NOTE — Telephone Encounter (Signed)
lpmtcb 3/4

## 2018-12-21 NOTE — Telephone Encounter (Signed)
Age 68, weight 94.3kg, SCr 1.24 on 12/05/18.

## 2018-12-21 NOTE — Telephone Encounter (Signed)
I spoke to the patient and he said that he has spoken to his employer this morning.  He has an OV on 3/5 and we should be able to help him initiate his work paperwork.  He was thankful for the call.

## 2018-12-22 ENCOUNTER — Encounter: Payer: Self-pay | Admitting: Cardiovascular Disease

## 2018-12-22 ENCOUNTER — Ambulatory Visit: Payer: Medicare Other | Admitting: Cardiovascular Disease

## 2018-12-22 VITALS — BP 122/76 | HR 46 | Ht 73.0 in | Wt 205.1 lb

## 2018-12-22 DIAGNOSIS — I6523 Occlusion and stenosis of bilateral carotid arteries: Secondary | ICD-10-CM

## 2018-12-22 DIAGNOSIS — I2510 Atherosclerotic heart disease of native coronary artery without angina pectoris: Secondary | ICD-10-CM

## 2018-12-22 DIAGNOSIS — E785 Hyperlipidemia, unspecified: Secondary | ICD-10-CM

## 2018-12-22 DIAGNOSIS — I10 Essential (primary) hypertension: Secondary | ICD-10-CM | POA: Diagnosis not present

## 2018-12-22 DIAGNOSIS — I480 Paroxysmal atrial fibrillation: Secondary | ICD-10-CM

## 2018-12-22 NOTE — Patient Instructions (Signed)

## 2018-12-22 NOTE — Progress Notes (Signed)
Chief Complaint  Patient presents with  . Follow-up    CAD, PAF   History of Present Illness: 68 yo male with history of PAF, sleep apnea, CAD, HTN, hyperlipidemia and carotid artery disease s/p right CEA who is here today for cardiac follow up. In 2002 he had a bare metal stent placed in the right coronary artery. A catheterization in 2008 showed non-obstructive disease. Had a Myoview scan in March 2011 which was negative for ischemia. He had duplex studies of his carotid in June 2012 with mild disease and stable right CEA site. Cardiac cath February 2017 with patent RCA stent and mild non-obstructive disease in the RCA, Circumflex and LAD. He was bradycardic when seen in our office July 2019 for a pre-operative assessment. His atenolol was stopped. He then began to have chest pressure and fatigue. Nuclear stress test July 2019 with no ischemia. Echo August 2019 with MGQQ=76-19%, grade 2 diastolic dysfunction. No significant valve disease. Carotid artery dopplers 2019 with mild bilateral carotid artery disease. Cardiac monitor with nocturnal sinus bradycardia and short 3 second runs of SVT. His beta blocker was not restarted. He was [facility] to [facility] August 2019 with atrial fibrillation with rapid ventricular response and was started on Eliquis. He converted to sinus in the ED after one dose of IV metoprolol. He was seen in the hospital by our EP team and they discussed Tikosyn vs prn beta blocker and he elected for prn beta blockers. He has been seen since follow up in the atrial fib clinic   He is here today for follow up. The patient denies any chest pain, dyspnea, palpitations, lower extremity edema, orthopnea, PND, dizziness, near syncope or syncope. Energy level is better. He is retired and has been fishing as often as he can.   Primary Care Physician: Lance Sell, NP  Past Medical History:  Diagnosis Date  . Atrial fibrillation ([place])   . Bradycardia   . Carotid artery disease  ([place])    Right CEA;  dopplers 6/12:  0-39% bilat ICA  . Coronary artery disease    s/p BMS to RCA 2002; [place] 2008: oLAD 40%, pRCA stent ok with 20%, EF 60%); Myoview scan in March 2011 which was negative for ischemia   . GERD (gastroesophageal reflux disease)   . Hyperlipidemia   . Hypertension   . Myocardial infarction ([place])   . OSA on CPAP     Past Surgical History:  Procedure Laterality Date  . CARDIAC CATHETERIZATION  06/01/2007    EF of 65% -- Nonobstructive CAD with 40% ostial stenosis in the LAD, no significant obstruction in the circumflex artery, 20% narrowing within the stent in the proximal to mid right coronary and normal LV function  . CARDIAC CATHETERIZATION  02/02/2006   EF of 50%  . CARDIAC CATHETERIZATION  01/19/2002   EF of 60%  . CARDIAC CATHETERIZATION N/A 12/03/2015   Procedure: Left Heart Cath and Coronary Angiography;  Surgeon: Burnell Blanks, MD;  Location: [facility];  Service: Cardiovascular;  Laterality: N/A;  . CAROTID ENDARTERECTOMY  06/26/2002   right  . CORONARY ANGIOPLASTY WITH STENT PLACEMENT  03/08/2000   Bare-metal stent in RCA, in [place]  . KNEE ARTHROSCOPY     right  . MOLE REMOVAL     Prior moles removed    Current Outpatient Medications  Medication Sig Dispense Refill  . amLODipine (NORVASC) 10 MG tablet Take 1 tablet by mouth daily 90 tablet 1  . aspirin  81 MG tablet Take 81 mg by mouth daily.      Marland Kitchen b complex vitamins capsule Take 1 capsule by mouth daily.    Marland Kitchen doxazosin (CARDURA) 2 MG tablet Take 1 tablet (2 mg total) by mouth daily. 180 tablet 1  . ELIQUIS 5 MG TABS tablet TAKE 1 TABLET BY MOUTH TWO  TIMES DAILY 180 tablet 1  . ezetimibe (ZETIA) 10 MG tablet Take 1 tablet (10 mg total) by mouth daily. 90 tablet 1  . finasteride (PROSCAR) 5 MG tablet Take 5 mg by mouth daily.     . hydrochlorothiazide (HYDRODIURIL) 50 MG tablet Take 0.5 tablets (25 mg total) by mouth daily. 45 tablet 1  . isosorbide mononitrate  (IMDUR) 30 MG 24 hr tablet Take 1 tablet (30 mg total) by mouth daily. 90 tablet 1  . metoprolol tartrate (LOPRESSOR) 25 MG tablet Take 1 tablet (25 mg total) by mouth daily as needed (as needed for palpitations/afib.). 60 tablet 11  . nitroGLYCERIN (NITROSTAT) 0.4 MG SL tablet Place 1 tablet (0.4 mg total) under the tongue every 5 (five) minutes as needed. Call 9-1-1 if need more than 2. 25 tablet 0  . pantoprazole (PROTONIX) 40 MG tablet Take 1 tablet by mouth every morning. 90 tablet 1  . potassium chloride (K-DUR,KLOR-CON) 10 MEQ tablet TAKE 1 TABLET(10 MEQ) BY MOUTH DAILY 90 tablet 1  . ramipril (ALTACE) 10 MG capsule TAKE 1 CAPSULE TWICE A DAY 180 capsule 1  . rosuvastatin (CRESTOR) 40 MG tablet Take 1 tablet (40 mg total) by mouth daily. 90 tablet 1  . tiZANidine (ZANAFLEX) 2 MG tablet Take one tablet at bedtime as needed for pain, if tolerating, ok to take twice daily as needed 45 tablet 3   No current facility-administered medications for this visit.     Allergies  Allergen Reactions  . Felodipine Swelling  . Niacin     REACTION: not tolerated.    Social History   Socioeconomic History  . Marital status: Married    Spouse name: Not on file  . Number of children: 3  . Years of education: 65  . Highest education level: High school graduate  Occupational History  . Occupation: Barista  . Financial resource strain: Not on file  . Food insecurity:    Worry: Not on file    Inability: Not on file  . Transportation needs:    Medical: Not on file    Non-medical: Not on file  Tobacco Use  . Smoking status: Former Smoker    Packs/day: 1.00    Years: 30.00    Pack years: 30.00    Types: Cigarettes    Last attempt to quit: 10/19/2000    Years since quitting: 18.1  . Smokeless tobacco: Current User    Types: Snuff  . Tobacco comment: Dips snuff.   Substance and Sexual Activity  . Alcohol use: Yes    Comment: Occasional beer  . Drug use: No  . Sexual  activity: Not on file  Lifestyle  . Physical activity:    Days per week: Not on file    Minutes per session: Not on file  . Stress: Not on file  Relationships  . Social connections:    Talks on phone: Not on file    Gets together: Not on file    Attends religious service: Not on file    Active member of club or organization: Not on file    Attends meetings of clubs or  organizations: Not on file    Relationship status: Not on file  . Intimate partner violence:    Fear of current or ex partner: Not on file    Emotionally abused: Not on file    Physically abused: Not on file    Forced sexual activity: Not on file  Other Topics Concern  . Not on file  Social History Narrative   Lives with wife in a one story home.  Has 3 children.  Works in Architect.  Education: high school.     Family History  Problem Relation Age of Onset  . Heart failure Father   . Heart disease Father   . Peripheral vascular disease Mother        with pacemaker, and had a carotid endarterectomy  . Heart disease Mother   . Hypertension Brother   . Hypertension Brother     Review of Systems:  As stated in the HPI and otherwise negative.   BP 122/76   Pulse (!) 46   Ht 6\' 1"  (1.854 m)   Wt 93 kg   SpO2 97%   BMI 27.06 kg/m   Physical Examination: General: Well developed, well nourished, NAD  HEENT: OP clear, mucus membranes moist  SKIN: warm, dry. No rashes. Neuro: No focal deficits  Musculoskeletal: Muscle strength 5/5 all ext  Psychiatric: Mood and affect normal  Neck: No JVD, no carotid bruits, no thyromegaly, no lymphadenopathy.  Lungs:Clear bilaterally, no wheezes, rhonci, crackles Cardiovascular: Regular rate and rhythm. No murmurs, gallops or rubs. Abdomen:Soft. Bowel sounds present. Non-tender.  Extremities: No lower extremity edema. Pulses are 2 + in the bilateral DP/PT.  EKG:  EKG is not ordered today. The ekg ordered today demonstrates   Recent Labs: 03/04/2018: TSH  1.75 05/30/2018: ALT 44; Magnesium 2.0 07/20/2018: Hemoglobin 13.6; Platelets 154 12/05/2018: BUN 17; Creatinine, Ser 1.24; Potassium 4.3; Sodium 144   Lipid Panel    Component Value Date/Time   CHOL 114 05/31/2018 0539   TRIG 108 05/31/2018 0539   HDL 30 (L) 05/31/2018 0539   CHOLHDL 3.8 05/31/2018 0539   VLDL 22 05/31/2018 0539   LDLCALC 62 05/31/2018 0539     Wt Readings from Last 3 Encounters:  12/22/18 93 kg  12/12/18 94.3 kg  08/29/18 91.3 kg     Other studies Reviewed: Additional studies/ records that were reviewed today include:  Review of the above records demonstrates:   Assessment and Plan:   1. CAD without angina: Cardiac cath 2017 with stable CAD. Nuclear stress test July 2019 with no ischemia. Continue ASA, Imdur, statin and Zetia. No beta blocker given nocturnal bradycardia and fatigue.    2. Hyperlipidemia:  LDL at goal. Continue statin and Zetia.   3. Hypertension: BP is controlled.   4. Carotid Artery Disease: Mild bilateral ICA stenosis by dopplers 2019.   5. Atrial fibrillation, paroxysmal: Sinus today. CHADS VASC score 3. Continue Eliquis. Continue to use metoprolol as needed.   Current medicines are reviewed at length with the patient today.  The patient does not have concerns regarding medicines.  The following changes have been made:  no change  Labs/ tests ordered today include:  No orders of the defined types were placed in this encounter.    Disposition:   FU with me in 12 months   Signed, Lauree Chandler, MD 12/22/2018 2:01 PM    [facility] [address], [place], [place]  [address] Phone: [phone]; Fax: [phone]

## 2018-12-28 ENCOUNTER — Ambulatory Visit: Payer: Medicare Other | Admitting: Cardiovascular Disease

## 2019-01-11 ENCOUNTER — Other Ambulatory Visit: Payer: Self-pay | Admitting: Neurology

## 2019-01-11 DIAGNOSIS — M79605 Pain in left leg: Principal | ICD-10-CM

## 2019-01-11 DIAGNOSIS — M79604 Pain in right leg: Secondary | ICD-10-CM

## 2019-01-30 ENCOUNTER — Other Ambulatory Visit: Payer: Self-pay | Admitting: Family

## 2019-01-30 DIAGNOSIS — I10 Essential (primary) hypertension: Secondary | ICD-10-CM

## 2019-01-30 MED ORDER — POTASSIUM CHLORIDE CRYS ER 10 MEQ PO TBCR: EXTENDED_RELEASE_TABLET | ORAL | 0 refills | Status: DC

## 2019-01-30 MED ORDER — RAMIPRIL 10 MG PO CAPS: ORAL_CAPSULE | ORAL | 0 refills | Status: DC

## 2019-02-22 ENCOUNTER — Other Ambulatory Visit: Payer: Self-pay | Admitting: *Deleted

## 2019-02-22 MED ORDER — AMLODIPINE BESYLATE 10 MG PO TABS: ORAL_TABLET | ORAL | 0 refills | Status: DC

## 2019-02-22 MED ORDER — EZETIMIBE 10 MG PO TABS: 10.0000 mg | ORAL_TABLET | Freq: Every day | ORAL | 0 refills | Status: DC

## 2019-02-22 MED ORDER — HYDROCHLOROTHIAZIDE 50 MG PO TABS: 25.0000 mg | ORAL_TABLET | Freq: Every day | ORAL | 0 refills | Status: DC

## 2019-02-22 MED ORDER — PANTOPRAZOLE SODIUM 40 MG PO TBEC: DELAYED_RELEASE_TABLET | ORAL | 0 refills | Status: DC

## 2019-02-22 MED ORDER — ROSUVASTATIN CALCIUM 40 MG PO TABS: 40.0000 mg | ORAL_TABLET | Freq: Every day | ORAL | 0 refills | Status: DC

## 2019-02-22 MED ORDER — ISOSORBIDE MONONITRATE ER 30 MG PO TB24: 30.0000 mg | ORAL_TABLET | Freq: Every day | ORAL | 0 refills | Status: DC

## 2019-02-22 NOTE — Addendum Note (Signed)
Addended by: Cresenciano Lick on: 02/22/2019 01:16 PM   Modules accepted: Orders

## 2019-02-23 ENCOUNTER — Other Ambulatory Visit: Payer: Self-pay | Admitting: Neurology

## 2019-02-23 DIAGNOSIS — M79605 Pain in left leg: Principal | ICD-10-CM

## 2019-02-23 DIAGNOSIS — M79604 Pain in right leg: Secondary | ICD-10-CM

## 2019-02-26 ENCOUNTER — Other Ambulatory Visit: Payer: Self-pay | Admitting: Family

## 2019-02-26 DIAGNOSIS — I10 Essential (primary) hypertension: Secondary | ICD-10-CM

## 2019-03-03 ENCOUNTER — Encounter: Payer: Self-pay | Admitting: Family

## 2019-03-03 ENCOUNTER — Ambulatory Visit (INDEPENDENT_AMBULATORY_CARE_PROVIDER_SITE_OTHER): Payer: Medicare Other | Admitting: Family

## 2019-03-03 DIAGNOSIS — E785 Hyperlipidemia, unspecified: Secondary | ICD-10-CM

## 2019-03-03 DIAGNOSIS — I480 Paroxysmal atrial fibrillation: Secondary | ICD-10-CM | POA: Diagnosis not present

## 2019-03-03 DIAGNOSIS — I10 Essential (primary) hypertension: Secondary | ICD-10-CM

## 2019-03-03 NOTE — Progress Notes (Signed)
[name] is a 68 y.o. male with the following history as recorded in Epic[employer]:  Patient Active Problem List   Diagnosis Date Noted  . Acute left ankle pain 08/05/2018  . Atrial fibrillation ([place]) 05/30/2018  . CAD in native artery   . Preoperative clearance 04/26/2018  . Bradycardia 04/26/2018  . Dizziness 04/26/2018  . Hypersomnia with sleep apnea 12/05/2015  . Unstable angina ([place]) 12/02/2015  . Plantar fasciitis, bilateral 12/13/2012  . Carotid artery disease ([place]) 11/24/2010  . BRADYCARDIA 04/12/2009  . Coronary atherosclerosis 12/25/2008  . Obstructive sleep apnea 02/03/2008  . MYOCARDIAL INFARCTION 02/03/2008  . Hyperlipidemia 10/08/2007  . Essential hypertension 10/08/2007  . GASTROESOPHAGEAL REFLUX DISEASE 10/08/2007    Current Outpatient Medications  Medication Sig Dispense Refill  . amLODipine (NORVASC) 10 MG tablet Take 1 tablet by mouth daily. Needs visit for future refills. 90 tablet 0  . aspirin 81 MG tablet Take 81 mg by mouth daily.      Marland Kitchen b complex vitamins capsule Take 1 capsule by mouth daily.    Marland Kitchen doxazosin (CARDURA) 2 MG tablet Take 1 tablet (2 mg total) by mouth daily. 180 tablet 1  . ELIQUIS 5 MG TABS tablet TAKE 1 TABLET BY MOUTH TWO  TIMES DAILY 180 tablet 1  . ezetimibe (ZETIA) 10 MG tablet Take 1 tablet (10 mg total) by mouth daily. Needs visit for future refills. 90 tablet 0  . finasteride (PROSCAR) 5 MG tablet Take 5 mg by mouth daily.     Marland Kitchen gabapentin (NEURONTIN) 300 MG capsule TAKE 1 CAPSULE BY MOUTH EVERY MORNING AND 2 CAPSULES AT BEDTIME` 90 capsule 0  . hydrochlorothiazide (HYDRODIURIL) 50 MG tablet Take 0.5 tablets (25 mg total) by mouth daily. Needs visit for future refills. 45 tablet 0  . isosorbide mononitrate (IMDUR) 30 MG 24 hr tablet Take 1 tablet (30 mg total) by mouth daily. Needs visit for future refills. 90 tablet 0  . metoprolol tartrate (LOPRESSOR) 25 MG tablet Take 1 tablet (25 mg total) by mouth daily as needed (as needed for  palpitations/afib.). 60 tablet 11  . nitroGLYCERIN (NITROSTAT) 0.4 MG SL tablet Place 1 tablet (0.4 mg total) under the tongue every 5 (five) minutes as needed. Call 9-1-1 if need more than 2. 25 tablet 0  . pantoprazole (PROTONIX) 40 MG tablet Take 1 tablet by mouth every morning. Needs visit for future refills. 90 tablet 0  . potassium chloride (K-DUR,KLOR-CON) 10 MEQ tablet TAKE 1 TABLET(10 MEQ) BY MOUTH DAILY 90 tablet 0  . ramipril (ALTACE) 10 MG capsule TAKE 1 CAPSULE BY MOUTH TWICE DAILY 180 capsule 0  . rosuvastatin (CRESTOR) 40 MG tablet Take 1 tablet (40 mg total) by mouth daily. Needs visit for future refills. 90 tablet 0  . tiZANidine (ZANAFLEX) 2 MG tablet Take one tablet at bedtime as needed for pain, if tolerating, ok to take twice daily as needed 45 tablet 3   No current facility-administered medications for this visit.     Allergies: Felodipine and Niacin  Past Medical History:  Diagnosis Date  . Atrial fibrillation ([place])   . Bradycardia   . Carotid artery disease ([place])    Right CEA;  dopplers 6/12:  0-39% bilat ICA  . Coronary artery disease    s/p BMS to RCA 2002; [place] 2008: oLAD 40%, pRCA stent ok with 20%, EF 60%); Myoview scan in March 2011 which was negative for ischemia   . GERD (gastroesophageal reflux disease)   . Hyperlipidemia   .  Hypertension   . Myocardial infarction ([place])   . OSA on CPAP     Past Surgical History:  Procedure Laterality Date  . CARDIAC CATHETERIZATION  06/01/2007    EF of 65% -- Nonobstructive CAD with 40% ostial stenosis in the LAD, no significant obstruction in the circumflex artery, 20% narrowing within the stent in the proximal to mid right coronary and normal LV function  . CARDIAC CATHETERIZATION  02/02/2006   EF of 50%  . CARDIAC CATHETERIZATION  01/19/2002   EF of 60%  . CARDIAC CATHETERIZATION N/A 12/03/2015   Procedure: Left Heart Cath and Coronary Angiography;  Surgeon: Burnell Blanks, MD;  Location: [facility];   Service: Cardiovascular;  Laterality: N/A;  . CAROTID ENDARTERECTOMY  06/26/2002   right  . CORONARY ANGIOPLASTY WITH STENT PLACEMENT  03/08/2000   Bare-metal stent in RCA, in [place]  . KNEE ARTHROSCOPY     right  . MOLE REMOVAL     Prior moles removed    Family History  Problem Relation Age of Onset  . Heart failure Father   . Heart disease Father   . Peripheral vascular disease Mother        with pacemaker, and had a carotid endarterectomy  . Heart disease Mother   . Hypertension Brother   . Hypertension Brother     Social History   Tobacco Use  . Smoking status: Former Smoker    Packs/day: 1.00    Years: 30.00    Pack years: 30.00    Types: Cigarettes    Last attempt to quit: 10/19/2000    Years since quitting: 18.3  . Smokeless tobacco: Current User    Types: Snuff  . Tobacco comment: Dips snuff.   Substance Use Topics  . Alcohol use: Yes    Comment: Occasional beer    Subjective:    I connected with [name] on 03/03/19 at  3:40 PM EDT by a video enabled telemedicine application and verified that I am speaking with the correct person using two identifiers.   I discussed the limitations of evaluation and management by telemedicine and the availability of in person appointments. The patient expressed understanding and agreed to proceed.   Patient was told he needed to meet with new PCP; his PCP has recently left the office; doing very well with no acute concerns; currently in [place] working on a short-term contract job- will be there through the end of the year; Denies any chest pain, shortness of breath, blurred vision or headache     Objective:  There were no vitals filed for this visit.  General: Well developed, well nourished, in no acute distress  Head: Normocephalic and atraumatic  Lungs: Respirations unlabored;  Neurologic: Alert and oriented; speech intact; face symmetrical;   Assessment:  1. Essential hypertension   2. Paroxysmal atrial  fibrillation (HCC)   3. Hyperlipidemia, unspecified hyperlipidemia type     Plan:  Stable; saw his cardiologist in March and was cleared for 1 year follow-up; will plan to follow-up here in 3 months to update labs.   No follow-ups on file.  No orders of the defined types were placed in this encounter.   Requested Prescriptions    No prescriptions requested or ordered in this encounter

## 2019-03-06 ENCOUNTER — Other Ambulatory Visit: Payer: Self-pay | Admitting: *Deleted

## 2019-03-06 MED ORDER — EZETIMIBE 10 MG PO TABS: 10.0000 mg | ORAL_TABLET | Freq: Every day | ORAL | 0 refills | Status: DC

## 2019-03-14 ENCOUNTER — Other Ambulatory Visit: Payer: Self-pay | Admitting: *Deleted

## 2019-03-14 DIAGNOSIS — I10 Essential (primary) hypertension: Secondary | ICD-10-CM

## 2019-03-14 MED ORDER — RAMIPRIL 10 MG PO CAPS: ORAL_CAPSULE | ORAL | 0 refills | Status: DC

## 2019-03-31 ENCOUNTER — Other Ambulatory Visit: Payer: Self-pay | Admitting: Family

## 2019-03-31 MED ORDER — EZETIMIBE 10 MG PO TABS: 10.0000 mg | ORAL_TABLET | Freq: Every day | ORAL | 0 refills | Status: DC

## 2019-04-27 ENCOUNTER — Other Ambulatory Visit: Payer: Self-pay | Admitting: Neurology

## 2019-04-27 DIAGNOSIS — M79604 Pain in right leg: Secondary | ICD-10-CM

## 2019-04-27 DIAGNOSIS — M79605 Pain in left leg: Secondary | ICD-10-CM

## 2019-05-09 ENCOUNTER — Encounter: Payer: Self-pay | Admitting: Neurology

## 2019-05-10 ENCOUNTER — Other Ambulatory Visit: Payer: Self-pay

## 2019-05-10 ENCOUNTER — Telehealth (INDEPENDENT_AMBULATORY_CARE_PROVIDER_SITE_OTHER): Payer: Medicare Other | Admitting: Neurology

## 2019-05-10 DIAGNOSIS — M79605 Pain in left leg: Secondary | ICD-10-CM

## 2019-05-10 DIAGNOSIS — M79604 Pain in right leg: Secondary | ICD-10-CM | POA: Diagnosis not present

## 2019-05-10 MED ORDER — GABAPENTIN 300 MG PO CAPS: ORAL_CAPSULE | ORAL | 3 refills | Status: DC

## 2019-05-10 NOTE — Progress Notes (Signed)
    Virtual Visit via Telephone Note The purpose of this virtual visit is to provide medical care while limiting exposure to the novel coronavirus.    Consent was obtained for phone visit:  Yes.   Answered questions that patient had about telehealth interaction:  Yes.   I discussed the limitations, risks, security and privacy concerns of performing an evaluation and management service by telephone. I also discussed with the patient that there may be a patient responsible charge related to this service. The patient expressed understanding and agreed to proceed.  Pt location: Home Physician Location: office Name of referring provider:  [name],* I connected with .[name] at patients initiation/request on 05/10/2019 at 11:10 AM EDT by telephone and verified that I am speaking with the correct person using two identifiers.  Pt MRN:  [id number] Pt DOB:  Jul 29, 1951   History of Present Illness: This is a 68 year old man returning for a follow-up of bilateral leg pain and cramps.  Since taking gabapentin 300 mg in the morning and 600 mg at bedtime, symptoms are very well controlled.  He denies any new pain, weakness, imbalance, or falls.   Assessment and Plan:   Bilateral leg pain, possibly due to lumbar canal stenosis.  EMG of the legs performed in December 2019 was normal.  Symptoms are very well controlled and responsive to gabapentin which will be continued at 300 mg in the morning and 600 mg at bedtime.  Refills were provided for 1 year.  Follow Up Instructions:   I discussed the assessment and treatment plan with the patient. The patient was provided an opportunity to ask questions and all were answered. The patient agreed with the plan and demonstrated an understanding of the instructions.   The patient was advised to call back or seek an in-person evaluation if the symptoms worsen or if the condition fails to improve as anticipated.  Return to clinic in 1 year  Total  Time spent in visit with the patient was:  8 min, of which 100% of the time was spent in counseling and/or coordinating care.   Pt understands and agrees with the plan of care outlined.     Alda Berthold, DO

## 2019-05-16 ENCOUNTER — Other Ambulatory Visit: Payer: Self-pay | Admitting: Cardiology

## 2019-05-17 ENCOUNTER — Other Ambulatory Visit: Payer: Self-pay | Admitting: Internal Medicine

## 2019-05-17 NOTE — Telephone Encounter (Signed)
Eliquis 5mg  refill request received; pt is 68 yrs old, wt-93 kg, Crea-1.24 on 12/05/2018, last seen by Dr. Angelena Form on 12/22/2018; will send in refill to requested pharmacy.

## 2019-05-24 ENCOUNTER — Telehealth: Payer: Medicare Other | Admitting: Neurology

## 2019-05-24 ENCOUNTER — Other Ambulatory Visit: Payer: Self-pay | Admitting: Family

## 2019-05-24 DIAGNOSIS — I10 Essential (primary) hypertension: Secondary | ICD-10-CM

## 2019-06-05 ENCOUNTER — Telehealth: Payer: Self-pay | Admitting: Cardiovascular Disease

## 2019-06-05 ENCOUNTER — Other Ambulatory Visit: Payer: Self-pay

## 2019-06-05 ENCOUNTER — Emergency Department (HOSPITAL_COMMUNITY): Payer: Medicare Other

## 2019-06-05 ENCOUNTER — Inpatient Hospital Stay (HOSPITAL_COMMUNITY)
Admission: EM | Admit: 2019-06-05 | Discharge: 2019-06-23 | DRG: 178 | Disposition: A | Discharge status: Home / Self Care | Payer: Medicare Other | Attending: Internal Medicine | Admitting: Internal Medicine

## 2019-06-05 ENCOUNTER — Encounter (HOSPITAL_COMMUNITY): Payer: Self-pay | Admitting: Emergency Medicine

## 2019-06-05 DIAGNOSIS — U071 COVID-19: Principal | ICD-10-CM | POA: Diagnosis present

## 2019-06-05 DIAGNOSIS — Z8249 Family history of ischemic heart disease and other diseases of the circulatory system: Secondary | ICD-10-CM

## 2019-06-05 DIAGNOSIS — N179 Acute kidney failure, unspecified: Secondary | ICD-10-CM | POA: Diagnosis present

## 2019-06-05 DIAGNOSIS — K219 Gastro-esophageal reflux disease without esophagitis: Secondary | ICD-10-CM | POA: Diagnosis not present

## 2019-06-05 DIAGNOSIS — I2510 Atherosclerotic heart disease of native coronary artery without angina pectoris: Secondary | ICD-10-CM | POA: Diagnosis not present

## 2019-06-05 DIAGNOSIS — I10 Essential (primary) hypertension: Secondary | ICD-10-CM | POA: Diagnosis present

## 2019-06-05 DIAGNOSIS — K529 Noninfective gastroenteritis and colitis, unspecified: Secondary | ICD-10-CM | POA: Diagnosis present

## 2019-06-05 DIAGNOSIS — G4733 Obstructive sleep apnea (adult) (pediatric): Secondary | ICD-10-CM | POA: Diagnosis present

## 2019-06-05 DIAGNOSIS — K567 Ileus, unspecified: Secondary | ICD-10-CM

## 2019-06-05 DIAGNOSIS — I480 Paroxysmal atrial fibrillation: Secondary | ICD-10-CM | POA: Diagnosis not present

## 2019-06-05 DIAGNOSIS — Z7901 Long term (current) use of anticoagulants: Secondary | ICD-10-CM | POA: Diagnosis not present

## 2019-06-05 DIAGNOSIS — E785 Hyperlipidemia, unspecified: Secondary | ICD-10-CM | POA: Diagnosis present

## 2019-06-05 DIAGNOSIS — K56609 Unspecified intestinal obstruction, unspecified as to partial versus complete obstruction: Secondary | ICD-10-CM

## 2019-06-05 DIAGNOSIS — I951 Orthostatic hypotension: Secondary | ICD-10-CM | POA: Diagnosis present

## 2019-06-05 DIAGNOSIS — E860 Dehydration: Secondary | ICD-10-CM | POA: Diagnosis not present

## 2019-06-05 DIAGNOSIS — Z7982 Long term (current) use of aspirin: Secondary | ICD-10-CM | POA: Diagnosis not present

## 2019-06-05 DIAGNOSIS — R740 Nonspecific elevation of levels of transaminase and lactic acid dehydrogenase [LDH]: Secondary | ICD-10-CM | POA: Diagnosis present

## 2019-06-05 DIAGNOSIS — I252 Old myocardial infarction: Secondary | ICD-10-CM

## 2019-06-05 DIAGNOSIS — K56600 Partial intestinal obstruction, unspecified as to cause: Secondary | ICD-10-CM | POA: Diagnosis not present

## 2019-06-05 DIAGNOSIS — R109 Unspecified abdominal pain: Secondary | ICD-10-CM

## 2019-06-05 DIAGNOSIS — Z888 Allergy status to other drugs, medicaments and biological substances status: Secondary | ICD-10-CM

## 2019-06-05 DIAGNOSIS — Z955 Presence of coronary angioplasty implant and graft: Secondary | ICD-10-CM

## 2019-06-05 DIAGNOSIS — I4891 Unspecified atrial fibrillation: Secondary | ICD-10-CM | POA: Diagnosis present

## 2019-06-05 DIAGNOSIS — K560 Paralytic ileus: Secondary | ICD-10-CM | POA: Diagnosis not present

## 2019-06-05 DIAGNOSIS — R55 Syncope and collapse: Secondary | ICD-10-CM

## 2019-06-05 DIAGNOSIS — Z0189 Encounter for other specified special examinations: Secondary | ICD-10-CM

## 2019-06-05 DIAGNOSIS — Z87891 Personal history of nicotine dependence: Secondary | ICD-10-CM | POA: Diagnosis not present

## 2019-06-05 HISTORY — DX: Syncope and collapse: R55

## 2019-06-05 HISTORY — DX: Acute kidney failure, unspecified: N17.9

## 2019-06-05 HISTORY — DX: COVID-19: U07.1

## 2019-06-05 LAB — T4, FREE: Free T4: 0.92 ng/dL (ref 0.61–1.12)

## 2019-06-05 LAB — BASIC METABOLIC PANEL
Anion gap: 13 (ref 5–15)
BUN: 20 mg/dL (ref 8–23)
CO2: 26 mmol/L (ref 22–32)
Calcium: 8.6 mg/dL — ABNORMAL LOW (ref 8.9–10.3)
Chloride: 96 mmol/L — ABNORMAL LOW (ref 98–111)
Creatinine, Ser: 1.79 mg/dL — ABNORMAL HIGH (ref 0.61–1.24)
GFR calc Af Amer: 45 mL/min — ABNORMAL LOW (ref >60)
GFR calc non Af Amer: 39 mL/min — ABNORMAL LOW (ref >60)
Glucose, Bld: 105 mg/dL — ABNORMAL HIGH (ref 70–99)
Potassium: 3.3 mmol/L — ABNORMAL LOW (ref 3.5–5.1)
Sodium: 135 mmol/L (ref 135–145)

## 2019-06-05 LAB — MAGNESIUM: Magnesium: 2 mg/dL (ref 1.7–2.4)

## 2019-06-05 LAB — CBC
HCT: 41.9 % (ref 39.0–52.0)
Hemoglobin: 13.9 g/dL (ref 13.0–17.0)
MCH: 29.6 pg (ref 26.0–34.0)
MCHC: 33.2 g/dL (ref 30.0–36.0)
MCV: 89.3 fL (ref 80.0–100.0)
Platelets: 130 10*3/uL — ABNORMAL LOW (ref 150–400)
RBC: 4.69 MIL/uL (ref 4.22–5.81)
RDW: 12 % (ref 11.5–15.5)
WBC: 3.1 10*3/uL — ABNORMAL LOW (ref 4.0–10.5)
nRBC: 0 % (ref 0.0–0.2)

## 2019-06-05 LAB — TROPONIN I (HIGH SENSITIVITY)
Troponin I (High Sensitivity): 17 ng/L (ref <18)
Troponin I (High Sensitivity): 17 ng/L (ref <18)

## 2019-06-05 LAB — SARS CORONAVIRUS 2 BY RT PCR (HOSPITAL ORDER, PERFORMED IN ~~LOC~~ HOSPITAL LAB): SARS Coronavirus 2: POSITIVE — AB

## 2019-06-05 LAB — TSH: TSH: 2.303 u[IU]/mL (ref 0.350–4.500)

## 2019-06-05 MED ORDER — PANTOPRAZOLE SODIUM 40 MG PO TBEC
40.0000 mg | DELAYED_RELEASE_TABLET | Freq: Every day | ORAL | Status: DC
  Administered 2019-06-06 – 2019-06-11 (×6): 40 mg via ORAL
  Filled 2019-06-05 (×6): qty 1

## 2019-06-05 MED ORDER — HYDROCOD POLST-CPM POLST ER 10-8 MG/5ML PO SUER: 5.0000 mL | Freq: Two times a day (BID) | ORAL | Status: DC | PRN

## 2019-06-05 MED ORDER — FINASTERIDE 5 MG PO TABS
5.0000 mg | ORAL_TABLET | Freq: Every day | ORAL | Status: DC
  Administered 2019-06-06 – 2019-06-23 (×16): 5 mg via ORAL
  Filled 2019-06-05 (×19): qty 1

## 2019-06-05 MED ORDER — APIXABAN 5 MG PO TABS
5.0000 mg | ORAL_TABLET | Freq: Two times a day (BID) | ORAL | Status: DC
  Administered 2019-06-06 – 2019-06-10 (×11): 5 mg via ORAL
  Filled 2019-06-05 (×12): qty 1

## 2019-06-05 MED ORDER — ONDANSETRON HCL 4 MG/2ML IJ SOLN
4.0000 mg | Freq: Four times a day (QID) | INTRAMUSCULAR | Status: DC | PRN
  Administered 2019-06-11 – 2019-06-22 (×12): 4 mg via INTRAVENOUS
  Filled 2019-06-05 (×15): qty 2

## 2019-06-05 MED ORDER — EZETIMIBE 10 MG PO TABS
10.0000 mg | ORAL_TABLET | Freq: Every day | ORAL | Status: DC
  Administered 2019-06-06 – 2019-06-10 (×5): 10 mg via ORAL
  Filled 2019-06-05 (×6): qty 1

## 2019-06-05 MED ORDER — TIZANIDINE HCL 2 MG PO TABS
2.0000 mg | ORAL_TABLET | Freq: Two times a day (BID) | ORAL | Status: DC | PRN
  Filled 2019-06-05 (×2): qty 1

## 2019-06-05 MED ORDER — GUAIFENESIN-DM 100-10 MG/5ML PO SYRP
10.0000 mL | ORAL_SOLUTION | ORAL | Status: DC | PRN
  Filled 2019-06-05 (×2): qty 10

## 2019-06-05 MED ORDER — SODIUM CHLORIDE 0.9% FLUSH
3.0000 mL | Freq: Once | INTRAVENOUS | Status: AC
  Administered 2019-06-15: 3 mL via INTRAVENOUS

## 2019-06-05 MED ORDER — SODIUM CHLORIDE 0.9 % IV SOLN
INTRAVENOUS | Status: DC
  Administered 2019-06-06: 01:00:00 via INTRAVENOUS

## 2019-06-05 MED ORDER — ROSUVASTATIN CALCIUM 20 MG PO TABS
40.0000 mg | ORAL_TABLET | Freq: Every day | ORAL | Status: DC
  Administered 2019-06-06 – 2019-06-23 (×16): 40 mg via ORAL
  Filled 2019-06-05 (×21): qty 2

## 2019-06-05 MED ORDER — ACETAMINOPHEN 325 MG PO TABS
650.0000 mg | ORAL_TABLET | Freq: Four times a day (QID) | ORAL | Status: DC | PRN
  Administered 2019-06-06 – 2019-06-20 (×5): 650 mg via ORAL
  Filled 2019-06-05 (×5): qty 2

## 2019-06-05 MED ORDER — ASPIRIN EC 81 MG PO TBEC
81.0000 mg | DELAYED_RELEASE_TABLET | Freq: Every day | ORAL | Status: DC
  Administered 2019-06-06 – 2019-06-22 (×15): 81 mg via ORAL
  Filled 2019-06-05 (×17): qty 1

## 2019-06-05 MED ORDER — ONDANSETRON HCL 4 MG PO TABS: 4.0000 mg | ORAL_TABLET | Freq: Four times a day (QID) | ORAL | Status: DC | PRN

## 2019-06-05 MED ORDER — GABAPENTIN 300 MG PO CAPS: 300.0000 mg | ORAL_CAPSULE | ORAL | Status: DC

## 2019-06-05 MED ORDER — NITROGLYCERIN 0.4 MG SL SUBL
0.4000 mg | SUBLINGUAL_TABLET | SUBLINGUAL | Status: DC | PRN
  Administered 2019-06-05 – 2019-06-07 (×4): 0.4 mg via SUBLINGUAL
  Filled 2019-06-05 (×4): qty 1

## 2019-06-05 NOTE — ED Provider Notes (Signed)
[name] Provider Note   CSN: [id number] Arrival date & time: 06/05/19  1455    History   Chief Complaint Chief Complaint  Patient presents with  . Chest Pain    HPI [name] is a 68 y.o. male h/o PAF on Eliquis and prn metoprolol, sleep apnea, CAD, HTN, hyperlipidemia and carotid artery disease s/p right CEA, grade 2 diastolic dysfunction EF 25-42% presents to ER for evaluation of syncope, twice associated with exertional CP, light headedness, SOB, fatigue.  Thursday AM was sitting on golf cart and had sudden onset light headedness, he walked a couple of steps and passed out.  Woke up immediately and noticed chest pressure described as "brick sitting on my chest". This chest pressure has been constant since Thursday, slightly worsened with exertion and light headedness.  Similar light headedness with syncope occurred Saturday morning. She went to the RN at his work place who told him his heart rate was "out of wack", slow and fast but he doesn't remember the exact pulse rate.  He took nitroglycerin x 2 today which almost got rid of chest pain completely.  He started taking his metoprolol prescribed prn on Thursday as well.  States similar symptoms in the past with new onset afib. Previous tobacco 30 yr ago. Compliant with Eliquis.   No recent illnesses. No illicit drug use. Only one cup of coffee daily, no other caffeine intake. No history of thyroid disease. No fever, cough. No LE edema, calf swelling. No COVID exposure.     HPI  Past Medical History:  Diagnosis Date  . Atrial fibrillation ([place])   . Bradycardia   . Carotid artery disease ([place])    Right CEA;  dopplers 6/12:  0-39% bilat ICA  . Coronary artery disease    s/p BMS to RCA 2002; [place] 2008: oLAD 40%, pRCA stent ok with 20%, EF 60%); Myoview scan in March 2011 which was negative for ischemia   . GERD (gastroesophageal reflux disease)   . Hyperlipidemia   . Hypertension   .  Myocardial infarction ([place])   . OSA on CPAP     Patient Active Problem List   Diagnosis Date Noted  . Syncope 06/05/2019  . COVID-19 virus infection 06/05/2019  . AKI (acute kidney injury) ([place]) 06/05/2019  . Acute left ankle pain 08/05/2018  . Atrial fibrillation ([place]) 05/30/2018  . CAD in native artery   . Preoperative clearance 04/26/2018  . Bradycardia 04/26/2018  . Dizziness 04/26/2018  . Hypersomnia with sleep apnea 12/05/2015  . Unstable angina ([place]) 12/02/2015  . Plantar fasciitis, bilateral 12/13/2012  . Carotid artery disease ([place]) 11/24/2010  . BRADYCARDIA 04/12/2009  . Coronary atherosclerosis 12/25/2008  . Obstructive sleep apnea 02/03/2008  . MYOCARDIAL INFARCTION 02/03/2008  . Hyperlipidemia 10/08/2007  . Essential hypertension 10/08/2007  . GASTROESOPHAGEAL REFLUX DISEASE 10/08/2007    Past Surgical History:  Procedure Laterality Date  . CARDIAC CATHETERIZATION  06/01/2007    EF of 65% -- Nonobstructive CAD with 40% ostial stenosis in the LAD, no significant obstruction in the circumflex artery, 20% narrowing within the stent in the proximal to mid right coronary and normal LV function  . CARDIAC CATHETERIZATION  02/02/2006   EF of 50%  . CARDIAC CATHETERIZATION  01/19/2002   EF of 60%  . CARDIAC CATHETERIZATION N/A 12/03/2015   Procedure: Left Heart Cath and Coronary Angiography;  Surgeon: Burnell Blanks, MD;  Location: [facility];  Service: Cardiovascular;  Laterality: N/A;  .  CAROTID ENDARTERECTOMY  06/26/2002   right  . CORONARY ANGIOPLASTY WITH STENT PLACEMENT  03/08/2000   Bare-metal stent in RCA, in [place]  . KNEE ARTHROSCOPY     right  . MOLE REMOVAL     Prior moles removed        Home Medications    Prior to Admission medications   Medication Sig Start Date End Date Taking? Authorizing Provider  amLODipine (NORVASC) 10 MG tablet Take 1 tablet by mouth daily. Needs visit for future refills. Patient taking differently:  Take 10 mg by mouth daily.  02/22/19  Yes Plotnikov, Evie Lacks, MD  aspirin 81 MG tablet Take 81 mg by mouth at bedtime.    Yes [provider]  b complex vitamins capsule Take 1 capsule by mouth daily.   Yes [provider]  doxazosin (CARDURA) 2 MG tablet Take 1 tablet (2 mg total) by mouth daily. Patient taking differently: Take 2 mg by mouth at bedtime.  10/07/18  Yes Shambley, Ashleigh N, NP  ELIQUIS 5 MG TABS tablet TAKE 1 TABLET BY MOUTH TWO  TIMES DAILY Patient taking differently: Take 5 mg by mouth 2 (two) times daily.  05/17/19  Yes Burnell Blanks, MD  ezetimibe (ZETIA) 10 MG tablet Take 1 tablet (10 mg total) by mouth daily. 03/31/19  Yes [name], FNP  finasteride (PROSCAR) 5 MG tablet Take 5 mg by mouth daily.  08/06/17  Yes [provider]  gabapentin (NEURONTIN) 300 MG capsule Take 1 tablet in the morning and 2 tablet at bedtime Patient taking differently: Take 300-600 mg by mouth See admin instructions. Take 300 mg by mouth in the morning and 600 mg at bedtime 05/10/19  Yes Patel, Donika K, DO  hydrochlorothiazide (HYDRODIURIL) 50 MG tablet Take 0.5 tablets (25 mg total) by mouth daily. Needs visit for future refills. Patient taking differently: Take 25 mg by mouth daily.  02/22/19  Yes Plotnikov, Evie Lacks, MD  isosorbide mononitrate (IMDUR) 30 MG 24 hr tablet Take 1 tablet (30 mg total) by mouth daily. Needs visit for future refills. Patient taking differently: Take 30 mg by mouth daily.  02/22/19  Yes Plotnikov, Evie Lacks, MD  metoprolol tartrate (LOPRESSOR) 25 MG tablet Take 1 tablet (25 mg total) by mouth daily as needed (as needed for palpitations/afib.). 05/31/18 06/05/19 Yes Daune Perch, NP  nitroGLYCERIN (NITROSTAT) 0.4 MG SL tablet Place 1 tablet (0.4 mg total) under the tongue every 5 (five) minutes as needed. Call 9-1-1 if need more than 2. 03/01/18  Yes Shambley, Delphia Grates, NP  pantoprazole (PROTONIX) 40 MG tablet TAKE 1 TABLET  BY MOUTH  EVERY MORNING. Patient taking differently: Take 40 mg by mouth daily before breakfast.  05/18/19  Yes [name], FNP  potassium chloride (K-DUR,KLOR-CON) 10 MEQ tablet TAKE 1 TABLET(10 MEQ) BY MOUTH DAILY Patient taking differently: Take 10 mEq by mouth daily.  01/30/19  Yes [name], FNP  ramipril (ALTACE) 10 MG capsule TAKE 1 CAPSULE BY MOUTH  TWICE DAILY Patient taking differently: Take 10 mg by mouth 2 (two) times daily.  05/26/19  Yes [name], FNP  rosuvastatin (CRESTOR) 40 MG tablet Take 1 tablet (40 mg total) by mouth daily. Needs visit for future refills. Patient taking differently: Take 40 mg by mouth daily.  02/22/19  Yes Plotnikov, Evie Lacks, MD  tiZANidine (ZANAFLEX) 2 MG tablet Take one tablet at bedtime as needed for pain, if tolerating, ok to take twice daily as needed  Patient taking differently: Take 2 mg by mouth 2 (two) times daily as needed for muscle spasms. Take one tablet at bedtime as needed for pain, if tolerating, ok to take twice daily as needed 08/29/18  Yes Alda Berthold, DO    Family History Family History  Problem Relation Age of Onset  . Heart failure Father   . Heart disease Father   . Peripheral vascular disease Mother        with pacemaker, and had a carotid endarterectomy  . Heart disease Mother   . Hypertension Brother   . Hypertension Brother     Social History Social History   Tobacco Use  . Smoking status: Former Smoker    Packs/day: 1.00    Years: 30.00    Pack years: 30.00    Types: Cigarettes    Quit date: 10/19/2000    Years since quitting: 18.6  . Smokeless tobacco: Current User    Types: Snuff  . Tobacco comment: Dips snuff.   Substance Use Topics  . Alcohol use: Yes    Alcohol/week: 1.0 standard drinks    Types: 1 Cans of beer per week    Comment: Occasional beer  . Drug use: No     Allergies   Felodipine and Niacin   Review of Systems Review of Systems  Respiratory:  Positive for chest tightness and shortness of breath.   Cardiovascular: Positive for chest pain.  Neurological: Positive for syncope and light-headedness.  Hematological: Bruises/bleeds easily.  All other systems reviewed and are negative.    Physical Exam Updated Vital Signs BP 120/72 (BP Location: Left Arm)   Pulse 80   Temp 99.2 F (37.3 C) (Oral)   Resp (!) 21   Ht 6\' 1"  (1.854 m)   Wt 88 kg   SpO2 94%   BMI 25.61 kg/m   Physical Exam Constitutional:      Appearance: He is well-developed.     Comments: NAD. Non toxic.   HENT:     Head: Normocephalic and atraumatic.     Nose: Nose normal.  Eyes:     General: Lids are normal.     Conjunctiva/sclera: Conjunctivae normal.  Neck:     Musculoskeletal: Normal range of motion.     Trachea: Trachea normal.     Comments: Trachea midline.  Cardiovascular:     Rate and Rhythm: Normal rate and regular rhythm.     Pulses:          Radial pulses are 2+ on the right side and 2+ on the left side.       Dorsalis pedis pulses are 2+ on the right side and 2+ on the left side.     Heart sounds: Normal heart sounds, S1 normal and S2 normal.     Comments: Pulse palpated, subtle pauses noted. No LE edema or calf tenderness.  Pulmonary:     Effort: Pulmonary effort is normal.     Breath sounds: Normal breath sounds.  Abdominal:     General: Bowel sounds are normal.     Palpations: Abdomen is soft.     Tenderness: There is no abdominal tenderness.     Comments: No epigastric tenderness. No distention.   Skin:    General: Skin is warm and dry.     Capillary Refill: Capillary refill takes less than 2 seconds.     Comments: No rash to chest wall  Neurological:     Mental Status: He is alert.  GCS: GCS eye subscore is 4. GCS verbal subscore is 5. GCS motor subscore is 6.  Psychiatric:        Speech: Speech normal.        Behavior: Behavior normal.        Thought Content: Thought content normal.      ED Treatments / Results   Labs (all labs ordered are listed, but only abnormal results are displayed) Labs Reviewed  SARS CORONAVIRUS 2 (HOSPITAL ORDER, [facility]) - Abnormal; Notable for the following components:      Result Value   SARS Coronavirus 2 POSITIVE (*)    All other components within normal limits  BASIC METABOLIC PANEL - Abnormal; Notable for the following components:   Potassium 3.3 (*)    Chloride 96 (*)    Glucose, Bld 105 (*)    Creatinine, Ser 1.79 (*)    Calcium 8.6 (*)    GFR calc non Af Amer 39 (*)    GFR calc Af Amer 45 (*)    All other components within normal limits  CBC - Abnormal; Notable for the following components:   WBC 3.1 (*)    Platelets 130 (*)    All other components within normal limits  D-DIMER, QUANTITATIVE (NOT AT [facility]) - Abnormal; Notable for the following components:   D-Dimer, Quant 0.51 (*)    All other components within normal limits  CBC WITH DIFFERENTIAL/PLATELET - Abnormal; Notable for the following components:   WBC 3.4 (*)    Platelets 118 (*)    All other components within normal limits  COMPREHENSIVE METABOLIC PANEL - Abnormal; Notable for the following components:   Potassium 3.3 (*)    Chloride 97 (*)    Creatinine, Ser 1.66 (*)    Calcium 8.5 (*)    AST 49 (*)    ALT 45 (*)    GFR calc non Af Amer 42 (*)    GFR calc Af Amer 49 (*)    All other components within normal limits  MAGNESIUM  TSH  T4, FREE  PROCALCITONIN  C-REACTIVE PROTEIN  HIV ANTIBODY (ROUTINE TESTING W REFLEX)  ABO/RH  TROPONIN I (HIGH SENSITIVITY)  TROPONIN I (HIGH SENSITIVITY)    EKG EKG Interpretation  Date/Time:  Monday June 05 2019 15:00:09 EDT Ventricular Rate:  99 PR Interval:  158 QRS Duration: 84 QT Interval:  358 QTC Calculation: 459 R Axis:   111 Text Interpretation:  Sinus rhythm with Fusion complexes and Premature atrial complexes Left posterior fascicular block Abnormal ECG Confirmed by Sherwood Gambler [phone]) on 06/05/2019  5:37:37 PM   Radiology Dg Chest 2 View  Result Date: 06/05/2019 CLINICAL DATA:  Syncope x2 over the past week. History of atrial fibrillation. EXAM: CHEST - 2 VIEW COMPARISON:  Single-view of the chest 05/30/2018. FINDINGS: Lungs clear. Heart size normal. Aortic atherosclerosis noted. No pneumothorax or pleural fluid. No acute or focal bony abnormality. IMPRESSION: No acute disease. Atherosclerosis. Electronically Signed   By: Inge Rise M.D.   On: 06/05/2019 15:50    Procedures Procedures (including critical care time)  Medications Ordered in ED Medications  sodium chloride flush (NS) 0.9 % injection 3 mL (has no administration in time range)  nitroGLYCERIN (NITROSTAT) SL tablet 0.4 mg (0.4 mg Sublingual Given 06/06/19 0145)  guaiFENesin-dextromethorphan (ROBITUSSIN DM) 100-10 MG/5ML syrup 10 mL (has no administration in time range)  chlorpheniramine-HYDROcodone (TUSSIONEX) 10-8 MG/5ML suspension 5 mL (has no administration in time range)  acetaminophen (TYLENOL) tablet 650  mg (has no administration in time range)  ondansetron (ZOFRAN) injection 4 mg (has no administration in time range)  aspirin EC tablet 81 mg (81 mg Oral Given 06/06/19 0057)  apixaban (ELIQUIS) tablet 5 mg (5 mg Oral Given 06/06/19 0825)  ezetimibe (ZETIA) tablet 10 mg (10 mg Oral Given 06/06/19 0823)  finasteride (PROSCAR) tablet 5 mg (5 mg Oral Given 06/06/19 0823)  pantoprazole (PROTONIX) EC tablet 40 mg (40 mg Oral Given 06/06/19 0824)  rosuvastatin (CRESTOR) tablet 40 mg (40 mg Oral Given 06/06/19 0824)  tiZANidine (ZANAFLEX) tablet 2 mg (has no administration in time range)  gabapentin (NEURONTIN) capsule 300 mg (300 mg Oral Given 06/06/19 0824)    And  gabapentin (NEURONTIN) capsule 600 mg (600 mg Oral Given 06/06/19 0057)  0.9 %  sodium chloride infusion ( Intravenous Rate/Dose Change 06/06/19 1326)  potassium chloride SA (K-DUR) CR tablet 40 mEq (40 mEq Oral Given 06/06/19 9323)     Initial Impression /  Assessment and Plan / ED Course  I have reviewed the triage vital signs and the nursing notes.  Pertinent labs & imaging results that were available during my care of the patient were reviewed by me and considered in my medical decision making (see chart for details).  Clinical Course as of Jun 06 1519  Mon Jun 05, 2019  1759 Sinus rhythm with Fusion complexes and Premature atrial complexes Left posterior fascicular block Abnormal ECG Confirmed by Sherwood Gambler [phone]) on 06/05/2019 5:37:37 PM  ED EKG within 10 minutes [CG]  1759 WBC(!): 3.1 [CG]  1759 Creatinine(!): 1.79 [CG]  1759 Potassium(!): 3.3 [CG]  1759 GFR, Est Non African American(!): 39 [CG]  2019 Spoke to Dr Alcario Drought -  does not want to admit patient until COVID test is back, concerned for possible COVID related symptoms. Explained pt should still be admitted for tele/syncope however  he would like to wait to do admitting orders. Will recheck temp. Pt has no infectious symptoms, COVID exposure.    [CG]  2051 Troponin I (High Sensitivity): 17 [CG]  2217 SARS Coronavirus 2(!): POSITIVE [CG]    Clinical Course User Index [CG] Kinnie Feil, PA-C   Chart reviewed to obtained PMH. LHC 2017 stable CAD. Stress test July 2019 without ischemia. CHADSVASC score 3. Halter monitor July 2019 showed nocturnal bardycardia PACs PVCs and several bursts of SVT < 3 seconds.  High suspicion for cardiac arrhythmia including PAF, SVT.    Considered medium risk for serious outcome based on cardiac h/o, CAD, SF and [place] Syncope scores.  ER work up reviewed by me EKG has PACs, left posterior fascicular block. Trop 17 >17. Otherwise unremarkarble. No atrial fibrillation in ER.    Will discuss with hospitalist for admission for syncope, CP.   Final Clinical Impressions(s) / ED Diagnoses   COVID positive.  Suspect this is contributing to exertional CP, SOB.  Will need to be on tele.  Accepted by Dr Alcario Drought. Discussed with EDP.  Final  diagnoses:  Syncope and collapse  COVID-19 virus infection    ED Discharge Orders    None       Kinnie Feil, PA-C 06/06/19 1521    Sherwood Gambler, MD 06/07/19 [phone]

## 2019-06-05 NOTE — H&P (Signed)
History and Physical    [name] NTI:[id number] DOB: 08-Apr-1951 DOA: 06/05/2019  PCP: [name], [place]  Patient coming from: Home  I have personally briefly reviewed patient's old medical records in [place]  Chief Complaint: Syncope  HPI: [name] is a 68 y.o. male with medical history significant of CAD, PAF, HTN, OSA.  Patient has been feeling well until Thursday when he was sitting on golf cart and had sudden onset light headedness, he walked a couple of steps and passed out.  Woke up immediately and noticed chest pressure described as "brick sitting on my chest". This chest pressure has been constant since Thursday, slightly worsened with exertion and light headedness.  Similar light headedness with syncope occurred Saturday morning. She went to the RN at his work place who told him his heart rate was "out of wack", slow and fast but he doesn't remember the exact pulse rate.  He took nitroglycerin x 2 today which almost got rid of chest pain completely.  He started taking his metoprolol prescribed prn on Thursday as well.  States similar symptoms in the past with new onset afib. Previous tobacco 30 yr ago. Compliant with Eliquis.   ED Course: No noted A.Fib in ED. BP running on the lower side (86P systolic), patient with mild AKI (creat 1.7 up from 1.2 baseline), Tm 100.1 in ED.  Satting 92% on RA at rest.  Decided given the Tm to test him for COVID though no other obvious COVID symptoms or sick contacts currently.  He did in-fact come back positive for COVID-19.   Review of Systems: As per HPI, otherwise all review of systems negative.  Past Medical History:  Diagnosis Date  . Atrial fibrillation ([place])   . Bradycardia   . Carotid artery disease ([place])    Right CEA;  dopplers 6/12:  0-39% bilat ICA  . Coronary artery disease    s/p BMS to RCA 2002; [place] 2008: oLAD 40%, pRCA stent ok with 20%, EF 60%); Myoview scan in March 2011 which was negative for  ischemia   . GERD (gastroesophageal reflux disease)   . Hyperlipidemia   . Hypertension   . Myocardial infarction ([place])   . OSA on CPAP     Past Surgical History:  Procedure Laterality Date  . CARDIAC CATHETERIZATION  06/01/2007    EF of 65% -- Nonobstructive CAD with 40% ostial stenosis in the LAD, no significant obstruction in the circumflex artery, 20% narrowing within the stent in the proximal to mid right coronary and normal LV function  . CARDIAC CATHETERIZATION  02/02/2006   EF of 50%  . CARDIAC CATHETERIZATION  01/19/2002   EF of 60%  . CARDIAC CATHETERIZATION N/A 12/03/2015   Procedure: Left Heart Cath and Coronary Angiography;  Surgeon: Burnell Blanks, MD;  Location: [facility];  Service: Cardiovascular;  Laterality: N/A;  . CAROTID ENDARTERECTOMY  06/26/2002   right  . CORONARY ANGIOPLASTY WITH STENT PLACEMENT  03/08/2000   Bare-metal stent in RCA, in [place]  . KNEE ARTHROSCOPY     right  . MOLE REMOVAL     Prior moles removed     reports that he quit smoking about 18 years ago. His smoking use included cigarettes. He has a 30.00 pack-year smoking history. His smokeless tobacco use includes snuff. He reports current alcohol use. He reports that he does not use drugs.  Allergies  Allergen Reactions  . Felodipine Swelling    Legs became  swollen  . Niacin Other (See Comments)    Made patient feel flushed    Family History  Problem Relation Age of Onset  . Heart failure Father   . Heart disease Father   . Peripheral vascular disease Mother        with pacemaker, and had a carotid endarterectomy  . Heart disease Mother   . Hypertension Brother   . Hypertension Brother      Prior to Admission medications   Medication Sig Start Date End Date Taking? Authorizing Provider  amLODipine (NORVASC) 10 MG tablet Take 1 tablet by mouth daily. Needs visit for future refills. Patient taking differently: Take 10 mg by mouth daily.  02/22/19  Yes  Plotnikov, Evie Lacks, MD  aspirin 81 MG tablet Take 81 mg by mouth at bedtime.    Yes [provider]  b complex vitamins capsule Take 1 capsule by mouth daily.   Yes [provider]  doxazosin (CARDURA) 2 MG tablet Take 1 tablet (2 mg total) by mouth daily. Patient taking differently: Take 2 mg by mouth at bedtime.  10/07/18  Yes Shambley, Ashleigh N, NP  ELIQUIS 5 MG TABS tablet TAKE 1 TABLET BY MOUTH TWO  TIMES DAILY Patient taking differently: Take 5 mg by mouth 2 (two) times daily.  05/17/19  Yes Burnell Blanks, MD  ezetimibe (ZETIA) 10 MG tablet Take 1 tablet (10 mg total) by mouth daily. 03/31/19  Yes [name], FNP  finasteride (PROSCAR) 5 MG tablet Take 5 mg by mouth daily.  08/06/17  Yes [provider]  gabapentin (NEURONTIN) 300 MG capsule Take 1 tablet in the morning and 2 tablet at bedtime Patient taking differently: Take 300-600 mg by mouth See admin instructions. Take 300 mg by mouth in the morning and 600 mg at bedtime 05/10/19  Yes Patel, Donika K, DO  hydrochlorothiazide (HYDRODIURIL) 50 MG tablet Take 0.5 tablets (25 mg total) by mouth daily. Needs visit for future refills. Patient taking differently: Take 25 mg by mouth daily.  02/22/19  Yes Plotnikov, Evie Lacks, MD  isosorbide mononitrate (IMDUR) 30 MG 24 hr tablet Take 1 tablet (30 mg total) by mouth daily. Needs visit for future refills. Patient taking differently: Take 30 mg by mouth daily.  02/22/19  Yes Plotnikov, Evie Lacks, MD  metoprolol tartrate (LOPRESSOR) 25 MG tablet Take 1 tablet (25 mg total) by mouth daily as needed (as needed for palpitations/afib.). 05/31/18 06/05/19 Yes Daune Perch, NP  nitroGLYCERIN (NITROSTAT) 0.4 MG SL tablet Place 1 tablet (0.4 mg total) under the tongue every 5 (five) minutes as needed. Call 9-1-1 if need more than 2. 03/01/18  Yes Shambley, Delphia Grates, NP  pantoprazole (PROTONIX) 40 MG tablet TAKE 1 TABLET BY MOUTH  EVERY MORNING. Patient taking  differently: Take 40 mg by mouth daily before breakfast.  05/18/19  Yes [name], FNP  potassium chloride (K-DUR,KLOR-CON) 10 MEQ tablet TAKE 1 TABLET(10 MEQ) BY MOUTH DAILY Patient taking differently: Take 10 mEq by mouth daily.  01/30/19  Yes [name], FNP  ramipril (ALTACE) 10 MG capsule TAKE 1 CAPSULE BY MOUTH  TWICE DAILY Patient taking differently: Take 10 mg by mouth 2 (two) times daily.  05/26/19  Yes [name], FNP  rosuvastatin (CRESTOR) 40 MG tablet Take 1 tablet (40 mg total) by mouth daily. Needs visit for future refills. Patient taking differently: Take 40 mg by mouth daily.  02/22/19  Yes Plotnikov, Evie Lacks, MD  tiZANidine (ZANAFLEX)  2 MG tablet Take one tablet at bedtime as needed for pain, if tolerating, ok to take twice daily as needed Patient taking differently: Take 2 mg by mouth 2 (two) times daily as needed for muscle spasms. Take one tablet at bedtime as needed for pain, if tolerating, ok to take twice daily as needed 08/29/18  Yes Alda Berthold, DO    Physical Exam: Vitals:   06/05/19 2015 06/05/19 2024 06/05/19 2045 06/05/19 2150  BP: (!) 143/59  104/66 133/84  Pulse: 87  77 75  Resp: 17  15 (!) 22  Temp:  99.2 F (37.3 C)    TempSrc:  Oral    SpO2: 96%  94% 91%    Constitutional: NAD, calm, comfortable Eyes: PERRL, lids and conjunctivae normal ENMT: Mucous membranes are moist. Posterior pharynx clear of any exudate or lesions.Normal dentition.  Neck: normal, supple, no masses, no thyromegaly Respiratory: clear to auscultation bilaterally, no wheezing, no crackles. Normal respiratory effort. No accessory muscle use.  Cardiovascular: Regular rate and rhythm, no murmurs / rubs / gallops. No extremity edema. 2+ pedal pulses. No carotid bruits.  Abdomen: no tenderness, no masses palpated. No hepatosplenomegaly. Bowel sounds positive.  Musculoskeletal: no clubbing / cyanosis. No joint deformity upper and lower extremities. Good  ROM, no contractures. Normal muscle tone.  Skin: no rashes, lesions, ulcers. No induration Neurologic: CN 2-12 grossly intact. Sensation intact, DTR normal. Strength 5/5 in all 4.  Psychiatric: Normal judgment and insight. Alert and oriented x 3. Normal mood.    Labs on Admission: I have personally reviewed following labs and imaging studies  CBC: Recent Labs  Lab 06/05/19 1516  WBC 3.1*  HGB 13.9  HCT 41.9  MCV 89.3  PLT 454*   Basic Metabolic Panel: Recent Labs  Lab 06/05/19 1516 06/05/19 1815  NA 135  --   K 3.3*  --   CL 96*  --   CO2 26  --   GLUCOSE 105*  --   BUN 20  --   CREATININE 1.79*  --   CALCIUM 8.6*  --   MG  --  2.0   GFR: CrCl cannot be calculated (Unknown ideal weight.). Liver Function Tests: No results for input(s): AST, ALT, ALKPHOS, BILITOT, PROT, ALBUMIN in the last 168 hours. No results for input(s): LIPASE, AMYLASE in the last 168 hours. No results for input(s): AMMONIA in the last 168 hours. Coagulation Profile: No results for input(s): INR, PROTIME in the last 168 hours. Cardiac Enzymes: No results for input(s): CKTOTAL, CKMB, CKMBINDEX, TROPONINI in the last 168 hours. BNP (last 3 results) No results for input(s): PROBNP in the last 8760 hours. HbA1C: No results for input(s): HGBA1C in the last 72 hours. CBG: No results for input(s): GLUCAP in the last 168 hours. Lipid Profile: No results for input(s): CHOL, HDL, LDLCALC, TRIG, CHOLHDL, LDLDIRECT in the last 72 hours. Thyroid Function Tests: Recent Labs    06/05/19 1815  TSH 2.303  FREET4 0.92   Anemia Panel: No results for input(s): VITAMINB12, FOLATE, FERRITIN, TIBC, IRON, RETICCTPCT in the last 72 hours. Urine analysis:    Component Value Date/Time   BILIRUBINUR negative 06/17/2018 1610   PROTEINUR Positive (A) 06/17/2018 1610   UROBILINOGEN 0.2 06/17/2018 1610   NITRITE negative 06/17/2018 1610   LEUKOCYTESUR Negative 06/17/2018 1610    Radiological Exams on  Admission: Dg Chest 2 View  Result Date: 06/05/2019 CLINICAL DATA:  Syncope x2 over the past week. History of atrial fibrillation. EXAM: CHEST -  2 VIEW COMPARISON:  Single-view of the chest 05/30/2018. FINDINGS: Lungs clear. Heart size normal. Aortic atherosclerosis noted. No pneumothorax or pleural fluid. No acute or focal bony abnormality. IMPRESSION: No acute disease. Atherosclerosis. Electronically Signed   By: Inge Rise M.D.   On: 06/05/2019 15:50    EKG: Independently reviewed.  Assessment/Plan Principal Problem:   Syncope Active Problems:   Essential hypertension   Atrial fibrillation (HCC)   CAD in native artery   COVID-19 virus infection   AKI (acute kidney injury) ([place])    1. Syncope - 1. Exertional syncope, suspect may be related to COVID-19 exertional hypoxia vs PAF (also possibly exacerbated by COVID), vs dehydration / hypotension / AKI (also related to mild COVID symptoms) 2. Tele monitor 3. 2d echo 4. Trops negative 5. PE seems less likely given that he is on chronic eliquis, also despite COVID has been pretty active (on golf course, etc). 2. AKI - mild hypotension in ED today 1. Hold home BP meds 2. IVF: NS at 100 cc/hr 3. Repeat CMP in AM 3. COVID 19 - 1. Check CRP, D.Dimer, procalcitonin 2. Supportive care 4. PAF - 1. Tele monitor 2. Continue eliquis 3. Looks like he only takes metoprolol PRN  DVT prophylaxis: Eliquis Code Status: Full Family Communication: No family in room Disposition Plan: Home after admit Consults called: None Admission status: Place in obs     [place], [place] [facility]  How to contact the [facility] Attending or Consulting provider [place] or covering provider during after hours [place], for this patient?  1. Check the care team in [facility] and look for a) attending/consulting [facility] provider listed and b) the [facility] team listed 2. Log into [email or web address]  Amion Physician Scheduling and messaging for groups and whole  hospitals  On call and physician scheduling software for group practices, residents, hospitalists and other medical providers for call, clinic, rotation and shift schedules. On[employer] is a hospital-wide system for scheduling doctors and paging doctors on call. EasyPlot is for scientific plotting and data analysis.  [email or web address]  and use [place] universal password to access. If you do not have the password, please contact the hospital operator.  3. Locate the [facility] provider you are looking for under [facility] and page to a number that you can be directly reached. 4. If you still have difficulty reaching the provider, please page the [facility] (Director on Call) for the Hospitalists listed on amion for assistance.  06/05/2019, 10:48 PM

## 2019-06-05 NOTE — ED Triage Notes (Signed)
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

## 2019-06-05 NOTE — Telephone Encounter (Signed)
I will send this call to Gilford Rile RN for Dr. Angelena Form as Juluis Rainier as pt is in the ED currently.

## 2019-06-05 NOTE — ED Notes (Signed)
Family at bedside. 

## 2019-06-05 NOTE — Telephone Encounter (Signed)
New Message  Patient's daughter called in to notify the doctor that the patient [name] is currently in the ER with severe chest pain. States that it has been going on since last night. Patient describes chest pain as a car sitting on his chest. Patient's daughter states that patient has been in the waiting room for 2 hours and some test have been ran (blood work and EKG).

## 2019-06-06 ENCOUNTER — Encounter (HOSPITAL_COMMUNITY): Payer: Self-pay

## 2019-06-06 ENCOUNTER — Observation Stay (HOSPITAL_COMMUNITY): Payer: Medicare Other

## 2019-06-06 DIAGNOSIS — K56609 Unspecified intestinal obstruction, unspecified as to partial versus complete obstruction: Secondary | ICD-10-CM | POA: Diagnosis not present

## 2019-06-06 DIAGNOSIS — K219 Gastro-esophageal reflux disease without esophagitis: Secondary | ICD-10-CM | POA: Diagnosis present

## 2019-06-06 DIAGNOSIS — K529 Noninfective gastroenteritis and colitis, unspecified: Secondary | ICD-10-CM | POA: Diagnosis present

## 2019-06-06 DIAGNOSIS — Z7901 Long term (current) use of anticoagulants: Secondary | ICD-10-CM | POA: Diagnosis not present

## 2019-06-06 DIAGNOSIS — Z8249 Family history of ischemic heart disease and other diseases of the circulatory system: Secondary | ICD-10-CM | POA: Diagnosis not present

## 2019-06-06 DIAGNOSIS — I480 Paroxysmal atrial fibrillation: Secondary | ICD-10-CM | POA: Diagnosis present

## 2019-06-06 DIAGNOSIS — I252 Old myocardial infarction: Secondary | ICD-10-CM | POA: Diagnosis not present

## 2019-06-06 DIAGNOSIS — I10 Essential (primary) hypertension: Secondary | ICD-10-CM | POA: Diagnosis present

## 2019-06-06 DIAGNOSIS — E785 Hyperlipidemia, unspecified: Secondary | ICD-10-CM | POA: Diagnosis present

## 2019-06-06 DIAGNOSIS — R740 Nonspecific elevation of levels of transaminase and lactic acid dehydrogenase [LDH]: Secondary | ICD-10-CM | POA: Diagnosis present

## 2019-06-06 DIAGNOSIS — Z7982 Long term (current) use of aspirin: Secondary | ICD-10-CM | POA: Diagnosis not present

## 2019-06-06 DIAGNOSIS — E860 Dehydration: Secondary | ICD-10-CM | POA: Diagnosis present

## 2019-06-06 DIAGNOSIS — K56600 Partial intestinal obstruction, unspecified as to cause: Secondary | ICD-10-CM | POA: Diagnosis not present

## 2019-06-06 DIAGNOSIS — I2510 Atherosclerotic heart disease of native coronary artery without angina pectoris: Secondary | ICD-10-CM | POA: Diagnosis present

## 2019-06-06 DIAGNOSIS — Z955 Presence of coronary angioplasty implant and graft: Secondary | ICD-10-CM | POA: Diagnosis not present

## 2019-06-06 DIAGNOSIS — R55 Syncope and collapse: Secondary | ICD-10-CM

## 2019-06-06 DIAGNOSIS — Z888 Allergy status to other drugs, medicaments and biological substances status: Secondary | ICD-10-CM | POA: Diagnosis not present

## 2019-06-06 DIAGNOSIS — I951 Orthostatic hypotension: Secondary | ICD-10-CM | POA: Diagnosis present

## 2019-06-06 DIAGNOSIS — Z87891 Personal history of nicotine dependence: Secondary | ICD-10-CM | POA: Diagnosis not present

## 2019-06-06 DIAGNOSIS — G4733 Obstructive sleep apnea (adult) (pediatric): Secondary | ICD-10-CM | POA: Diagnosis present

## 2019-06-06 DIAGNOSIS — N179 Acute kidney failure, unspecified: Secondary | ICD-10-CM | POA: Diagnosis present

## 2019-06-06 DIAGNOSIS — K560 Paralytic ileus: Secondary | ICD-10-CM | POA: Diagnosis not present

## 2019-06-06 DIAGNOSIS — U071 COVID-19: Secondary | ICD-10-CM | POA: Diagnosis present

## 2019-06-06 LAB — CBC WITH DIFFERENTIAL/PLATELET
Abs Immature Granulocytes: 0.02 10*3/uL (ref 0.00–0.07)
Basophils Absolute: 0 10*3/uL (ref 0.0–0.1)
Basophils Relative: 1 %
Eosinophils Absolute: 0 10*3/uL (ref 0.0–0.5)
Eosinophils Relative: 0 %
HCT: 39.3 % (ref 39.0–52.0)
Hemoglobin: 13.3 g/dL (ref 13.0–17.0)
Immature Granulocytes: 1 %
Lymphocytes Relative: 24 %
Lymphs Abs: 0.8 10*3/uL (ref 0.7–4.0)
MCH: 30.2 pg (ref 26.0–34.0)
MCHC: 33.8 g/dL (ref 30.0–36.0)
MCV: 89.3 fL (ref 80.0–100.0)
Monocytes Absolute: 0.4 10*3/uL (ref 0.1–1.0)
Monocytes Relative: 13 %
Neutro Abs: 2.1 10*3/uL (ref 1.7–7.7)
Neutrophils Relative %: 61 %
Platelets: 118 10*3/uL — ABNORMAL LOW (ref 150–400)
RBC: 4.4 MIL/uL (ref 4.22–5.81)
RDW: 11.9 % (ref 11.5–15.5)
WBC: 3.4 10*3/uL — ABNORMAL LOW (ref 4.0–10.5)
nRBC: 0 % (ref 0.0–0.2)

## 2019-06-06 LAB — COMPREHENSIVE METABOLIC PANEL
ALT: 45 U/L — ABNORMAL HIGH (ref 0–44)
AST: 49 U/L — ABNORMAL HIGH (ref 15–41)
Albumin: 3.7 g/dL (ref 3.5–5.0)
Alkaline Phosphatase: 46 U/L (ref 38–126)
Anion gap: 10 (ref 5–15)
BUN: 23 mg/dL (ref 8–23)
CO2: 30 mmol/L (ref 22–32)
Calcium: 8.5 mg/dL — ABNORMAL LOW (ref 8.9–10.3)
Chloride: 97 mmol/L — ABNORMAL LOW (ref 98–111)
Creatinine, Ser: 1.66 mg/dL — ABNORMAL HIGH (ref 0.61–1.24)
GFR calc Af Amer: 49 mL/min — ABNORMAL LOW (ref >60)
GFR calc non Af Amer: 42 mL/min — ABNORMAL LOW (ref >60)
Glucose, Bld: 99 mg/dL (ref 70–99)
Potassium: 3.3 mmol/L — ABNORMAL LOW (ref 3.5–5.1)
Sodium: 137 mmol/L (ref 135–145)
Total Bilirubin: 1.2 mg/dL (ref 0.3–1.2)
Total Protein: 7.2 g/dL (ref 6.5–8.1)

## 2019-06-06 LAB — ECHOCARDIOGRAM LIMITED
Height: 73 in
Weight: 3105.6 oz

## 2019-06-06 LAB — C-REACTIVE PROTEIN: CRP: <0.8 mg/dL (ref <1.0)

## 2019-06-06 LAB — PROCALCITONIN: Procalcitonin: <0.1 ng/mL

## 2019-06-06 LAB — D-DIMER, QUANTITATIVE: D-Dimer, Quant: 0.51 ug/mL-FEU — ABNORMAL HIGH (ref 0.00–0.50)

## 2019-06-06 LAB — ABO/RH: ABO/RH(D): O POS

## 2019-06-06 MED ORDER — POTASSIUM CHLORIDE CRYS ER 20 MEQ PO TBCR
40.0000 meq | EXTENDED_RELEASE_TABLET | Freq: Once | ORAL | Status: AC
  Administered 2019-06-06: 40 meq via ORAL
  Filled 2019-06-06: qty 2

## 2019-06-06 MED ORDER — SODIUM CHLORIDE 0.9 % IV SOLN: INTRAVENOUS | Status: DC

## 2019-06-06 MED ORDER — GABAPENTIN 300 MG PO CAPS
600.0000 mg | ORAL_CAPSULE | Freq: Every day | ORAL | Status: DC
  Administered 2019-06-06 – 2019-06-10 (×6): 600 mg via ORAL
  Filled 2019-06-06 (×6): qty 2

## 2019-06-06 MED ORDER — GABAPENTIN 300 MG PO CAPS
300.0000 mg | ORAL_CAPSULE | Freq: Every day | ORAL | Status: DC
  Administered 2019-06-06 – 2019-06-10 (×5): 300 mg via ORAL
  Filled 2019-06-06 (×5): qty 1

## 2019-06-06 NOTE — Progress Notes (Signed)
PROGRESS NOTE                                                                                                                                                                                                             Patient Demographics:    [name], is a 68 y.o. male, DOB - Apr 25, 1951, KVQ:[id number]  Outpatient Primary MD for the patient is [name], FNP    LOS - 0  Admit date - 06/05/2019    Chief Complaint  Patient presents with  . Chest Pain       Brief Narrative  [name] is a 68 y.o. male with medical history significant of CAD, PAF, HTN, OSA.  Patient has been feeling well until Thursday when he was sitting on golf cart and had sudden onset light headedness, he walked a couple of steps and passed out. Woke up immediately and noticed chest pressure described as "brick sitting on my chest". This chest pressure has been constant since Thursday, slightly worsened with exertion and light headedness. Similar light headedness with syncope occurred Saturday morning. She went to the RN at his work place who told him his heart rate was "out of wack", slow and fast but he doesn't remember the exact pulse rate. He took nitroglycerin x 2 today which almost got rid of chest pain completely. He started taking his metoprolol prescribed prn on Thursday as well. In the ER he was diagnosed with COVID-19 incidental infection and sent here.   Subjective:    [name] today has, No headache, No chest pain, No abdominal pain - No Nausea, No new weakness tingling or numbness, no Cough - SOB.    Assessment  & Plan :     1. Incidental COVID-19 infection during the ongoing 2020 Covid 19 Pandemic - clear chest x-ray, inflammatory markers unremarkable, no cough or shortness of breath afebrile do not think this is of any significance.   COVID-19 Labs  Recent Labs    06/06/19 0610  DDIMER 0.51*  CRP <0.8    Lab  Results  Component Value Date   SARSCOV2NAA POSITIVE (A) 06/05/2019     Hepatic Function Latest Ref Rng & Units 06/06/2019 05/30/2018 04/28/2018  Total Protein 6.5 - 8.1 g/dL 7.2 6.4(L) 7.1  Albumin 3.5 - 5.0 g/dL 3.7 3.9 4.4  AST 15 -  41 U/L 49(H) 30 24  ALT 0 - 44 U/L 45(H) 44 36  Alk Phosphatase 38 - 126 U/L 46 49 50  Total Bilirubin 0.3 - 1.2 mg/dL 1.2 1.4(H) 1.1  Bilirubin, Direct 0.0 - 0.3 mg/dL - - -     No results found for: BNP    2.  Dehydration and AKI.  Hydrate and monitor avoid nephrotoxins.  3.  Syncope.  Multiple episodes.  Check orthostatics, could be could be due to #2 above.  Echo is stable no murmur.  Troponin negative, no chest pain currently.  4.  Paroxysmal atrial fibrillation had vas 2 score of at least 3.  Currently on Eliquis continue, outpatient cardiology follow-up echocardiogram stable.  5.  Dyslipidemia on statin and ezetimibe.  6.  Chest pressure.  Resolved.  He is already on combination of aspirin, Eliquis, statin.  Currently chest pain-free.  Outpatient cardiology follow-up with his primary cardiologist Dr. Skeet Latch once he is discharged.  May require outpatient stress test.  Troponin negative this admission.  7.  Mild COVID-19 related transaminitis.  Stable follow trend.    Condition - Fair  Family Communication  :  Wife 06/06/19  Code Status :  Full   Diet :   Diet Order            Diet Heart Room service appropriate? Yes; Fluid consistency: Thin  Diet effective now               Disposition Plan  :  Home 2-3 days  Consults  :  None  Procedures  :    TTE -   1. The left ventricle has normal systolic function, with an ejection fraction of 60-65%. The cavity size was normal. There is mildly increased left ventricular wall thickness. Left ventricular diastolic Doppler parameters are consistent with impaired  relaxation. Indeterminate filling pressures The E/e' is 8-15. No evidence of left ventricular regional wall motion  abnormalities.  2. The right ventricle has normal systolc function. The cavity was normal. There is no increase in right ventricular wall thickness.  3. The mitral valve is grossly normal.  4. The tricuspid valve was grossly normal.  5. The aortic valve is tricuspid Mild sclerosis of the aortic valve. No stenosis of the aortic valve.   PUD Prophylaxis : PPI  DVT Prophylaxis  :  Eliquis  Lab Results  Component Value Date   PLT 118 (L) 06/06/2019    Inpatient Medications  Scheduled Meds: . apixaban  5 mg Oral BID  . aspirin EC  81 mg Oral QHS  . ezetimibe  10 mg Oral Daily  . finasteride  5 mg Oral Daily  . gabapentin  300 mg Oral Daily   And  . gabapentin  600 mg Oral QHS  . pantoprazole  40 mg Oral QAC breakfast  . rosuvastatin  40 mg Oral Daily  . sodium chloride flush  3 mL Intravenous Once   Continuous Infusions: . sodium chloride 50 mL/hr at 06/06/19 0833   PRN Meds:.acetaminophen, chlorpheniramine-HYDROcodone, guaiFENesin-dextromethorphan, nitroGLYCERIN, [DISCONTINUED] ondansetron **OR** ondansetron (ZOFRAN) IV, tiZANidine  Antibiotics  :    Anti-infectives (From admission, onward)   None       Time Spent in minutes  30   Lala Lund M.D on 06/06/2019 at 10:32 AM  To page go to [email or web address] - password [email or web address]  [facility] -  Office  [phone]   See all Orders from today for further details    Objective:   Vitals:  06/06/19 0032 06/06/19 0145 06/06/19 0407 06/06/19 0737  BP: 128/76  127/64 112/72  Pulse: 83 74 79 80  Resp: (!) 21 (!) 24 (!) 21 20  Temp: 100 F (37.8 C)  99.1 F (37.3 C) 99 F (37.2 C)  TempSrc: Oral  Oral Oral  SpO2: 92% 93% 95% 95%  Weight: 88 kg     Height: _0  (1.854 m)       Wt Readings from Last 3 Encounters:  06/06/19 88 kg  05/09/19 95.3 kg  12/22/18 93 kg     Intake/Output Summary (Last 24 hours) at 06/06/2019 1032 Last data filed at 06/06/2019 0859 Gross per 24 hour  Intake 710.02 ml  Output -   Net 710.02 ml     Physical Exam  Awake Alert, Oriented X 3, No new F.N deficits, Normal affect [place].AT,PERRAL Supple Neck,No JVD, No cervical lymphadenopathy appriciated.  Symmetrical Chest wall movement, Good air movement bilaterally, CTAB RRR,No Gallops,Rubs or new Murmurs, No Parasternal Heave +ve B.Sounds, Abd Soft, No tenderness, No organomegaly appriciated, No rebound - guarding or rigidity. No Cyanosis, Clubbing or edema, No new Rash or bruise       Data Review:    CBC Recent Labs  Lab 06/05/19 1516 06/06/19 0610  WBC 3.1* 3.4*  HGB 13.9 13.3  HCT 41.9 39.3  PLT 130* 118*  MCV 89.3 89.3  MCH 29.6 30.2  MCHC 33.2 33.8  RDW 12.0 11.9  LYMPHSABS  --  0.8  MONOABS  --  0.4  EOSABS  --  0.0  BASOSABS  --  0.0    Chemistries  Recent Labs  Lab 06/05/19 1516 06/05/19 1815 06/06/19 0610  NA 135  --  137  K 3.3*  --  3.3*  CL 96*  --  97*  CO2 26  --  30  GLUCOSE 105*  --  99  BUN 20  --  23  CREATININE 1.79*  --  1.66*  CALCIUM 8.6*  --  8.5*  MG  --  2.0  --   AST  --   --  49*  ALT  --   --  45*  ALKPHOS  --   --  46  BILITOT  --   --  1.2   ------------------------------------------------------------------------------------------------------------------ No results for input(s): CHOL, HDL, LDLCALC, TRIG, CHOLHDL, LDLDIRECT in the last 72 hours.  Lab Results  Component Value Date   HGBA1C 6.2 04/01/2018   ------------------------------------------------------------------------------------------------------------------ Recent Labs    06/05/19 1815  TSH 2.303    Cardiac Enzymes No results for input(s): CKMB, TROPONINI, MYOGLOBIN in the last 168 hours.  Invalid input(s): CK ------------------------------------------------------------------------------------------------------------------ No results found for: BNP  Micro Results Recent Results (from the past 240 hour(s))  SARS Coronavirus 2 [facility] order, Performed in [facility] hospital  lab) Nasopharyngeal Nasopharyngeal Swab     Status: Abnormal   Collection Time: 06/05/19  8:25 PM   Specimen: Nasopharyngeal Swab  Result Value Ref Range Status   SARS Coronavirus 2 POSITIVE (A) NEGATIVE Final    Comment: RESULT CALLED TO, READ BACK BY AND VERIFIED WITH: Chalmers Cater RN 06/05/19 2134 JDW (NOTE) If result is NEGATIVE SARS-CoV-2 target nucleic acids are NOT DETECTED. The SARS-CoV-2 RNA is generally detectable in upper and lower  respiratory specimens during the acute phase of infection. The lowest  concentration of SARS-CoV-2 viral copies this assay can detect is 250  copies / mL. A negative result does not preclude SARS-CoV-2 infection  and should not be used  as the sole basis for treatment or other  patient management decisions.  A negative result may occur with  improper specimen collection / handling, submission of specimen other  than nasopharyngeal swab, presence of viral mutation(s) within the  areas targeted by this assay, and inadequate number of viral copies  (<250 copies / mL). A negative result must be combined with clinical  observations, patient history, and epidemiological information. If result is POSITIVE SARS-CoV-2 target nucleic acids are DETECTED. The SARS -CoV-2 RNA is generally detectable in upper and lower  respiratory specimens during the acute phase of infection.  Positive  results are indicative of active infection with SARS-CoV-2.  Clinical  correlation with patient history and other diagnostic information is  necessary to determine patient infection status.  Positive results do  not rule out bacterial infection or co-infection with other viruses. If result is PRESUMPTIVE POSTIVE SARS-CoV-2 nucleic acids MAY BE PRESENT.   A presumptive positive result was obtained on the submitted specimen  and confirmed on repeat testing.  While 2019 novel coronavirus  (SARS-CoV-2) nucleic acids may be present in the submitted sample  additional confirmatory  testing may be necessary for epidemiological  and / or clinical management purposes  to differentiate between  SARS-CoV-2 and other Sarbecovirus currently known to infect humans.  If clinically indicated additional testing with an alternate test  methodology [id number]) is advise d. The SARS-CoV-2 RNA is generally  detectable in upper and lower respiratory specimens during the acute  phase of infection. The expected result is Negative. Fact Sheet for Patients:  StrictlyIdeas.no Fact Sheet for Healthcare Providers: BankingDealers.co.za This test is not yet approved or cleared by the [place] FDA and has been authorized for detection and/or diagnosis of SARS-CoV-2 by FDA under an Emergency Use Authorization (EUA).  This EUA will remain in effect (meaning this test can be used) for the duration of the COVID-19 declaration under Section 564(b)(1) of the Act, 21 U.S.C. section 360bbb-3(b)(1), unless the authorization is terminated or revoked sooner. Performed at [facility], [place] [address], [place], [place] [address]     Radiology Reports Dg Chest 2 View  Result Date: 06/05/2019 CLINICAL DATA:  Syncope x2 over the past week. History of atrial fibrillation. EXAM: CHEST - 2 VIEW COMPARISON:  Single-view of the chest 05/30/2018. FINDINGS: Lungs clear. Heart size normal. Aortic atherosclerosis noted. No pneumothorax or pleural fluid. No acute or focal bony abnormality. IMPRESSION: No acute disease. Atherosclerosis. Electronically Signed   By: Inge Rise M.D.   On: 06/05/2019 15:50

## 2019-06-06 NOTE — Progress Notes (Signed)
  Echocardiogram 2D Echocardiogram limited has been performed.  Darlina Sicilian M 06/06/2019, 8:13 AM

## 2019-06-06 NOTE — Plan of Care (Signed)
Updated wife - discussed current patient status and POC

## 2019-06-06 NOTE — Plan of Care (Signed)
  Problem: Education: Goal: Knowledge of risk factors and measures for prevention of condition will improve Outcome: Progressing   Problem: Respiratory: Goal: Will maintain a patent airway Outcome: Progressing   Problem: Activity: Goal: Risk for activity intolerance will decrease Outcome: Progressing   Problem: Pain Managment: Goal: General experience of comfort will improve Outcome: Progressing   Problem: Safety: Goal: Ability to remain free from injury will improve Outcome: Progressing

## 2019-06-07 ENCOUNTER — Other Ambulatory Visit: Payer: Self-pay

## 2019-06-07 LAB — COMPREHENSIVE METABOLIC PANEL
ALT: 67 U/L — ABNORMAL HIGH (ref 0–44)
AST: 72 U/L — ABNORMAL HIGH (ref 15–41)
Albumin: 3.7 g/dL (ref 3.5–5.0)
Alkaline Phosphatase: 44 U/L (ref 38–126)
Anion gap: 10 (ref 5–15)
BUN: 19 mg/dL (ref 8–23)
CO2: 26 mmol/L (ref 22–32)
Calcium: 8.8 mg/dL — ABNORMAL LOW (ref 8.9–10.3)
Chloride: 102 mmol/L (ref 98–111)
Creatinine, Ser: 1.32 mg/dL — ABNORMAL HIGH (ref 0.61–1.24)
GFR calc Af Amer: >60 mL/min (ref >60)
GFR calc non Af Amer: 56 mL/min — ABNORMAL LOW (ref >60)
Glucose, Bld: 101 mg/dL — ABNORMAL HIGH (ref 70–99)
Potassium: 3.7 mmol/L (ref 3.5–5.1)
Sodium: 138 mmol/L (ref 135–145)
Total Bilirubin: 1.3 mg/dL — ABNORMAL HIGH (ref 0.3–1.2)
Total Protein: 7.2 g/dL (ref 6.5–8.1)

## 2019-06-07 LAB — CBC WITH DIFFERENTIAL/PLATELET
Abs Immature Granulocytes: 0.01 10*3/uL (ref 0.00–0.07)
Basophils Absolute: 0 10*3/uL (ref 0.0–0.1)
Basophils Relative: 0 %
Eosinophils Absolute: 0 10*3/uL (ref 0.0–0.5)
Eosinophils Relative: 0 %
HCT: 43.5 % (ref 39.0–52.0)
Hemoglobin: 14.7 g/dL (ref 13.0–17.0)
Immature Granulocytes: 0 %
Lymphocytes Relative: 14 %
Lymphs Abs: 0.6 10*3/uL — ABNORMAL LOW (ref 0.7–4.0)
MCH: 29.9 pg (ref 26.0–34.0)
MCHC: 33.8 g/dL (ref 30.0–36.0)
MCV: 88.6 fL (ref 80.0–100.0)
Monocytes Absolute: 0.3 10*3/uL (ref 0.1–1.0)
Monocytes Relative: 8 %
Neutro Abs: 3.4 10*3/uL (ref 1.7–7.7)
Neutrophils Relative %: 78 %
Platelets: 122 10*3/uL — ABNORMAL LOW (ref 150–400)
RBC: 4.91 MIL/uL (ref 4.22–5.81)
RDW: 12 % (ref 11.5–15.5)
WBC: 4.4 10*3/uL (ref 4.0–10.5)
nRBC: 0 % (ref 0.0–0.2)

## 2019-06-07 LAB — D-DIMER, QUANTITATIVE: D-Dimer, Quant: 1.24 ug/mL-FEU — ABNORMAL HIGH (ref 0.00–0.50)

## 2019-06-07 LAB — C-REACTIVE PROTEIN: CRP: 0.9 mg/dL (ref <1.0)

## 2019-06-07 LAB — HIV ANTIBODY (ROUTINE TESTING W REFLEX): HIV Screen 4th Generation wRfx: NONREACTIVE

## 2019-06-07 LAB — BRAIN NATRIURETIC PEPTIDE: B Natriuretic Peptide: 23.7 pg/mL (ref 0.0–100.0)

## 2019-06-07 LAB — MAGNESIUM: Magnesium: 2 mg/dL (ref 1.7–2.4)

## 2019-06-07 MED ORDER — LACTATED RINGERS IV SOLN
INTRAVENOUS | Status: DC
  Administered 2019-06-07: 08:00:00 via INTRAVENOUS

## 2019-06-07 MED ORDER — LOPERAMIDE HCL 2 MG PO CAPS
4.0000 mg | ORAL_CAPSULE | Freq: Four times a day (QID) | ORAL | Status: DC | PRN
  Administered 2019-06-07 – 2019-06-09 (×5): 4 mg via ORAL
  Filled 2019-06-07 (×4): qty 2

## 2019-06-07 MED ORDER — LACTATED RINGERS IV SOLN
INTRAVENOUS | Status: AC
  Administered 2019-06-07: 13:00:00 via INTRAVENOUS

## 2019-06-07 MED ORDER — LACTATED RINGERS IV SOLN
INTRAVENOUS | Status: AC
  Administered 2019-06-07 (×2): via INTRAVENOUS

## 2019-06-07 NOTE — Progress Notes (Signed)
Spoke to Rutledge all questions answered about the plan of care and the daily activities. She also requested a provider's call to talk about pt's heart condition. Provider notified.

## 2019-06-07 NOTE — Progress Notes (Signed)
Patient in bed complained of chest pain of a 7 on a scale of 0-10. Patient given Nitrostat 0.4mg , sublingual, which was effective. Vital signs stable as charted will continue to monitor for remainder of shift.

## 2019-06-07 NOTE — Progress Notes (Signed)
PT Cancellation Note  Patient Details Name: [name] MRN: [id number] DOB: 08/27/1951   Cancelled Treatment:    Reason Eval/Treat Not Completed: Medical issues which prohibited therapy  Patient had episode of chest pain with RN giving nitroglycerin @ 1436. Patient also reports he continues to get very dizzy and feel like he is gong to pass out when he gets up to [facility].  Will attempt evaluation 06/08/19    Barry Brunner, PT      [name] 06/07/2019, 2:55 PM

## 2019-06-07 NOTE — Progress Notes (Signed)
PT Cancellation Note  Patient Details Name: [name] MRN: [id number] DOB: 03/08/1951   Cancelled Treatment:    Reason Eval/Treat Not Completed: Patient not medically ready  Noted pt orthostatic this a.m. with nursing. He is now getting IV fluids, however having diarrhea and TED hose have not yet arrived. She is going to communicate with MD re: med for diarrhea.   Will plan to see this afternoon as medically appropriate.  Jeanie Cooks Vaidehi Braddy, PT 06/07/2019, 11:53 AM

## 2019-06-07 NOTE — Plan of Care (Addendum)
Patient in bed resting. No s/s of pain or distress. Medication given well tolerated. Patient educated on the importance of TED hose. Will continue to monitor for remainder of shift.     Problem: Education: Goal: Knowledge of risk factors and measures for prevention of condition will improve Outcome: Progressing   Problem: Coping: Goal: Psychosocial and spiritual needs will be supported Outcome: Progressing   Problem: Respiratory: Goal: Will maintain a patent airway Outcome: Progressing Goal: Complications related to the disease process, condition or treatment will be avoided or minimized Outcome: Progressing   Problem: Clinical Measurements: Goal: Will remain free from infection Outcome: Progressing   Problem: Activity: Goal: Risk for activity intolerance will decrease Outcome: Progressing   Problem: Nutrition: Goal: Adequate nutrition will be maintained Outcome: Progressing   Problem: Pain Managment: Goal: General experience of comfort will improve Outcome: Progressing   Problem: Safety: Goal: Ability to remain free from injury will improve Outcome: Progressing   Problem: Education: Goal: Knowledge of General Education information will improve Description: Including pain rating scale, medication(s)/side effects and non-pharmacologic comfort measures Outcome: Progressing   Problem: Health Behavior/Discharge Planning: Goal: Ability to manage health-related needs will improve Outcome: Progressing   Problem: Clinical Measurements: Goal: Ability to maintain clinical measurements within normal limits will improve Outcome: Progressing Goal: Diagnostic test results will improve Outcome: Progressing Goal: Respiratory complications will improve Outcome: Progressing Goal: Cardiovascular complication will be avoided Outcome: Progressing   Problem: Coping: Goal: Level of anxiety will decrease Outcome: Progressing   Problem: Elimination: Goal: Will not experience  complications related to bowel motility Outcome: Progressing Goal: Will not experience complications related to urinary retention Outcome: Progressing   Problem: Skin Integrity: Goal: Risk for impaired skin integrity will decrease Outcome: Progressing

## 2019-06-07 NOTE — Progress Notes (Signed)
PROGRESS NOTE                                                                                                                                                                                                             Patient Demographics:    [name], is a 68 y.o. male, DOB - 07-Oct-1951, BLT:[id number]  Outpatient Primary MD for the patient is [name], FNP    LOS - 1  Admit date - 06/05/2019    Chief Complaint  Patient presents with   Chest Pain       Brief Narrative  [name] is a 68 y.o. male with medical history significant of CAD, PAF, HTN, OSA.  Patient has been feeling well until Thursday when he was sitting on golf cart and had sudden onset light headedness, he walked a couple of steps and passed out. Woke up immediately and noticed chest pressure described as "brick sitting on my chest". This chest pressure has been constant since Thursday, slightly worsened with exertion and light headedness. Similar light headedness with syncope occurred Saturday morning. She went to the RN at his work place who told him his heart rate was "out of wack", slow and fast but he doesn't remember the exact pulse rate. He took nitroglycerin x 2 today which almost got rid of chest pain completely. He started taking his metoprolol prescribed prn on Thursday as well. In the ER he was diagnosed with COVID-19 incidental infection and sent here.   Subjective:   Patient in bed, appears comfortable, denies any headache, no fever, no chest pain or pressure, no shortness of breath , no abdominal pain. No focal weakness.   Assessment  & Plan :     1. Incidental COVID-19 infection during the ongoing 2020 Covid 19 Pandemic - clear chest x-ray, inflammatory markers unremarkable, no cough or shortness of breath afebrile do not think this is of any significance.   COVID-19 Labs  Recent Labs    06/06/19 0610 06/07/19 0212   DDIMER 0.51* 1.24*  CRP <0.8 0.9    Lab Results  Component Value Date   SARSCOV2NAA POSITIVE (A) 06/05/2019     Hepatic Function Latest Ref Rng & Units 06/07/2019 06/06/2019 05/30/2018  Total Protein 6.5 - 8.1 g/dL 7.2 7.2 6.4(L)  Albumin 3.5 - 5.0 g/dL 3.7 3.7 3.9  AST 15 - 41 U/L 72(H) 49(H) 30  ALT 0 - 44 U/L 67(H) 45(H) 44  Alk Phosphatase 38 - 126 U/L 44 46 49  Total Bilirubin 0.3 - 1.2 mg/dL 1.3(H) 1.2 1.4(H)  Bilirubin, Direct 0.0 - 0.3 mg/dL - - -        Component Value Date/Time   BNP 23.7 06/07/2019 0212      2.  Dehydration and AKI.  Continue IV fluids, avoid nephrotoxins and monitor renal function.  3.  Syncope.  Multiple episodes.  Was clearly dehydrated and orthostatic, TED stockings applied, is being hydrated with IV fluids, offending medications held continue to monitor, stable on telemetry with nonacute echo.  Await PT input.  4.  Paroxysmal atrial fibrillation had vas 2 score of at least 3.  Currently on Eliquis continue, outpatient cardiology follow-up echocardiogram stable.  5.  Dyslipidemia on statin and ezetimibe.  6.  Chest pressure.  Resolved.  He is already on combination of aspirin, Eliquis, statin.  Currently chest pain-free.  Outpatient cardiology follow-up with his primary cardiologist Dr. Skeet Latch once he is discharged.  May require outpatient stress test.  Troponin negative this admission.  7.  Mild COVID-19 related transaminitis.  Stable follow trend.    Condition - Fair  Family Communication  :  Wife 06/06/19  Code Status :  Full   Diet :   Diet Order            Diet Heart Room service appropriate? Yes; Fluid consistency: Thin  Diet effective now               Disposition Plan  :  Home 2-3 days  Consults  :  None  Procedures  :    TTE -   1. The left ventricle has normal systolic function, with an ejection fraction of 60-65%. The cavity size was normal. There is mildly increased left ventricular wall thickness.  Left ventricular diastolic Doppler parameters are consistent with impaired  relaxation. Indeterminate filling pressures The E/e' is 8-15. No evidence of left ventricular regional wall motion abnormalities.  2. The right ventricle has normal systolc function. The cavity was normal. There is no increase in right ventricular wall thickness.  3. The mitral valve is grossly normal.  4. The tricuspid valve was grossly normal.  5. The aortic valve is tricuspid Mild sclerosis of the aortic valve. No stenosis of the aortic valve.   PUD Prophylaxis : PPI  DVT Prophylaxis  :  Eliquis  Lab Results  Component Value Date   PLT 122 (L) 06/07/2019    Inpatient Medications  Scheduled Meds:  apixaban  5 mg Oral BID   aspirin EC  81 mg Oral QHS   ezetimibe  10 mg Oral Daily   finasteride  5 mg Oral Daily   gabapentin  300 mg Oral Daily   And   gabapentin  600 mg Oral QHS   pantoprazole  40 mg Oral QAC breakfast   rosuvastatin  40 mg Oral Daily   sodium chloride flush  3 mL Intravenous Once   Continuous Infusions:  lactated ringers 75 mL/hr at 06/07/19 0810   PRN Meds:.acetaminophen, chlorpheniramine-HYDROcodone, guaiFENesin-dextromethorphan, nitroGLYCERIN, [DISCONTINUED] ondansetron **OR** ondansetron (ZOFRAN) IV, tiZANidine  Antibiotics  :    Anti-infectives (From admission, onward)   None       Time Spent in minutes  30   Lala Lund M.D on 06/07/2019 at 9:54 AM  To page go to [email or web address] - password [email or web address]  [facility] -  Office  [phone]   See all Orders from today for further details    Objective:   Vitals:   06/07/19 0040 06/07/19 0050 06/07/19 0400 06/07/19 0742  BP:   132/78 121/76  Pulse:   80 80  Resp: (!) '24 20 20 ' (!) 22  Temp:   98.7 F (37.1 C) 98.4 F (36.9 C)  TempSrc:   Oral Oral  SpO2: 92% 93% 92% 92%  Weight:      Height:        Wt Readings from Last 3 Encounters:  06/06/19 88 kg  05/09/19 95.3 kg  12/22/18 93 kg      Intake/Output Summary (Last 24 hours) at 06/07/2019 0954 Last data filed at 06/07/2019 0400 Gross per 24 hour  Intake 1085 ml  Output 1000 ml  Net 85 ml     Physical Exam  Awake Alert, Oriented X 3, No new F.N deficits, Normal affect [place].AT,PERRAL Supple Neck,No JVD, No cervical lymphadenopathy appriciated.  Symmetrical Chest wall movement, Good air movement bilaterally, CTAB RRR,No Gallops, Rubs or new Murmurs, No Parasternal Heave +ve B.Sounds, Abd Soft, No tenderness, No organomegaly appriciated, No rebound - guarding or rigidity. No Cyanosis, Clubbing or edema, No new Rash or bruise   Data Review:    CBC Recent Labs  Lab 06/05/19 1516 06/06/19 0610 06/07/19 0212  WBC 3.1* 3.4* 4.4  HGB 13.9 13.3 14.7  HCT 41.9 39.3 43.5  PLT 130* 118* 122*  MCV 89.3 89.3 88.6  MCH 29.6 30.2 29.9  MCHC 33.2 33.8 33.8  RDW 12.0 11.9 12.0  LYMPHSABS  --  0.8 0.6*  MONOABS  --  0.4 0.3  EOSABS  --  0.0 0.0  BASOSABS  --  0.0 0.0    Chemistries  Recent Labs  Lab 06/05/19 1516 06/05/19 1815 06/06/19 0610 06/07/19 0212  NA 135  --  137 138  K 3.3*  --  3.3* 3.7  CL 96*  --  97* 102  CO2 26  --  30 26  GLUCOSE 105*  --  99 101*  BUN 20  --  23 19  CREATININE 1.79*  --  1.66* 1.32*  CALCIUM 8.6*  --  8.5* 8.8*  MG  --  2.0  --  2.0  AST  --   --  49* 72*  ALT  --   --  45* 67*  ALKPHOS  --   --  46 44  BILITOT  --   --  1.2 1.3*   ------------------------------------------------------------------------------------------------------------------ No results for input(s): CHOL, HDL, LDLCALC, TRIG, CHOLHDL, LDLDIRECT in the last 72 hours.  Lab Results  Component Value Date   HGBA1C 6.2 04/01/2018   ------------------------------------------------------------------------------------------------------------------ Recent Labs    06/05/19 1815  TSH 2.303    Cardiac Enzymes No results for input(s): CKMB, TROPONINI, MYOGLOBIN in the last 168 hours.  Invalid  input(s): CK ------------------------------------------------------------------------------------------------------------------    Component Value Date/Time   BNP 23.7 06/07/2019 0212    Micro Results Recent Results (from the past 240 hour(s))  SARS Coronavirus 2 [facility] order, Performed in [facility] hospital lab) Nasopharyngeal Nasopharyngeal Swab     Status: Abnormal   Collection Time: 06/05/19  8:25 PM   Specimen: Nasopharyngeal Swab  Result Value Ref Range Status   SARS Coronavirus 2 POSITIVE (A) NEGATIVE Final    Comment: RESULT CALLED TO, READ BACK BY AND VERIFIED WITH: Chalmers Cater RN 06/05/19 2134 JDW (NOTE) If result is NEGATIVE SARS-CoV-2 target nucleic acids are NOT DETECTED. The  SARS-CoV-2 RNA is generally detectable in upper and lower  respiratory specimens during the acute phase of infection. The lowest  concentration of SARS-CoV-2 viral copies this assay can detect is 250  copies / mL. A negative result does not preclude SARS-CoV-2 infection  and should not be used as the sole basis for treatment or other  patient management decisions.  A negative result may occur with  improper specimen collection / handling, submission of specimen other  than nasopharyngeal swab, presence of viral mutation(s) within the  areas targeted by this assay, and inadequate number of viral copies  (<250 copies / mL). A negative result must be combined with clinical  observations, patient history, and epidemiological information. If result is POSITIVE SARS-CoV-2 target nucleic acids are DETECTED. The SARS -CoV-2 RNA is generally detectable in upper and lower  respiratory specimens during the acute phase of infection.  Positive  results are indicative of active infection with SARS-CoV-2.  Clinical  correlation with patient history and other diagnostic information is  necessary to determine patient infection status.  Positive results do  not rule out bacterial infection or co-infection with  other viruses. If result is PRESUMPTIVE POSTIVE SARS-CoV-2 nucleic acids MAY BE PRESENT.   A presumptive positive result was obtained on the submitted specimen  and confirmed on repeat testing.  While 2019 novel coronavirus  (SARS-CoV-2) nucleic acids may be present in the submitted sample  additional confirmatory testing may be necessary for epidemiological  and / or clinical management purposes  to differentiate between  SARS-CoV-2 and other Sarbecovirus currently known to infect humans.  If clinically indicated additional testing with an alternate test  methodology [phone]) is advise d. The SARS-CoV-2 RNA is generally  detectable in upper and lower respiratory specimens during the acute  phase of infection. The expected result is Negative. Fact Sheet for Patients:  StrictlyIdeas.no Fact Sheet for Healthcare Providers: BankingDealers.co.za This test is not yet approved or cleared by the [place] FDA and has been authorized for detection and/or diagnosis of SARS-CoV-2 by FDA under an Emergency Use Authorization (EUA).  This EUA will remain in effect (meaning this test can be used) for the duration of the COVID-19 declaration under Section 564(b)(1) of the Act, 21 U.S.C. section 360bbb-3(b)(1), unless the authorization is terminated or revoked sooner. Performed at [facility], [place] [address], [place], [place] [address]     Radiology Reports Dg Chest 2 View  Result Date: 06/05/2019 CLINICAL DATA:  Syncope x2 over the past week. History of atrial fibrillation. EXAM: CHEST - 2 VIEW COMPARISON:  Single-view of the chest 05/30/2018. FINDINGS: Lungs clear. Heart size normal. Aortic atherosclerosis noted. No pneumothorax or pleural fluid. No acute or focal bony abnormality. IMPRESSION: No acute disease. Atherosclerosis. Electronically Signed   By: Inge Rise M.D.   On: 06/05/2019 15:50

## 2019-06-07 NOTE — Plan of Care (Signed)
Progressing on all goals at this time.  

## 2019-06-07 NOTE — Progress Notes (Signed)
Patient needed to use bathroom at 0530. This RN obtained orthostatic vitals. Patient running SBP 120s while sleeping overnight. While sitting, patient systolic 101B. After ambulating to toilet and back patient c/o dizziness. SBP after 3 minutes of standing in the 90s. After laying back down, pressure back up to SBP 120s. See flowsheets for vitals.

## 2019-06-08 LAB — CBC WITH DIFFERENTIAL/PLATELET
Abs Immature Granulocytes: 0.02 10*3/uL (ref 0.00–0.07)
Basophils Absolute: 0 10*3/uL (ref 0.0–0.1)
Basophils Relative: 0 %
Eosinophils Absolute: 0.1 10*3/uL (ref 0.0–0.5)
Eosinophils Relative: 1 %
HCT: 42.2 % (ref 39.0–52.0)
Hemoglobin: 14.2 g/dL (ref 13.0–17.0)
Immature Granulocytes: 0 %
Lymphocytes Relative: 20 %
Lymphs Abs: 0.9 10*3/uL (ref 0.7–4.0)
MCH: 29.8 pg (ref 26.0–34.0)
MCHC: 33.6 g/dL (ref 30.0–36.0)
MCV: 88.7 fL (ref 80.0–100.0)
Monocytes Absolute: 0.4 10*3/uL (ref 0.1–1.0)
Monocytes Relative: 9 %
Neutro Abs: 3.2 10*3/uL (ref 1.7–7.7)
Neutrophils Relative %: 70 %
Platelets: 142 10*3/uL — ABNORMAL LOW (ref 150–400)
RBC: 4.76 MIL/uL (ref 4.22–5.81)
RDW: 12.1 % (ref 11.5–15.5)
WBC: 4.6 10*3/uL (ref 4.0–10.5)
nRBC: 0 % (ref 0.0–0.2)

## 2019-06-08 LAB — COMPREHENSIVE METABOLIC PANEL
ALT: 59 U/L — ABNORMAL HIGH (ref 0–44)
AST: 51 U/L — ABNORMAL HIGH (ref 15–41)
Albumin: 3.3 g/dL — ABNORMAL LOW (ref 3.5–5.0)
Alkaline Phosphatase: 43 U/L (ref 38–126)
Anion gap: 10 (ref 5–15)
BUN: 16 mg/dL (ref 8–23)
CO2: 24 mmol/L (ref 22–32)
Calcium: 8.4 mg/dL — ABNORMAL LOW (ref 8.9–10.3)
Chloride: 103 mmol/L (ref 98–111)
Creatinine, Ser: 1.26 mg/dL — ABNORMAL HIGH (ref 0.61–1.24)
GFR calc Af Amer: >60 mL/min (ref >60)
GFR calc non Af Amer: 59 mL/min — ABNORMAL LOW (ref >60)
Glucose, Bld: 87 mg/dL (ref 70–99)
Potassium: 3.7 mmol/L (ref 3.5–5.1)
Sodium: 137 mmol/L (ref 135–145)
Total Bilirubin: 1.3 mg/dL — ABNORMAL HIGH (ref 0.3–1.2)
Total Protein: 6.7 g/dL (ref 6.5–8.1)

## 2019-06-08 LAB — C-REACTIVE PROTEIN: CRP: 1.3 mg/dL — ABNORMAL HIGH (ref <1.0)

## 2019-06-08 LAB — D-DIMER, QUANTITATIVE: D-Dimer, Quant: 3.66 ug/mL-FEU — ABNORMAL HIGH (ref 0.00–0.50)

## 2019-06-08 LAB — MAGNESIUM: Magnesium: 1.9 mg/dL (ref 1.7–2.4)

## 2019-06-08 LAB — BRAIN NATRIURETIC PEPTIDE: B Natriuretic Peptide: 17.9 pg/mL (ref 0.0–100.0)

## 2019-06-08 MED ORDER — POTASSIUM CHLORIDE CRYS ER 20 MEQ PO TBCR
40.0000 meq | EXTENDED_RELEASE_TABLET | Freq: Once | ORAL | Status: AC
  Administered 2019-06-08: 40 meq via ORAL
  Filled 2019-06-08: qty 2

## 2019-06-08 MED ORDER — LACTATED RINGERS IV SOLN
INTRAVENOUS | Status: AC
  Administered 2019-06-08: 11:00:00 via INTRAVENOUS

## 2019-06-08 NOTE — Progress Notes (Signed)
Spoke to [place] all questions answered about the plan of care and the daily activities.

## 2019-06-08 NOTE — Progress Notes (Signed)
PROGRESS NOTE                                                                                                                                                                                                             Patient Demographics:    [name], is a 68 y.o. male, DOB - 1951-07-09, CBS:[id number]  Outpatient Primary MD for the patient is [name], [place]    LOS - 2  Admit date - 06/05/2019    Chief Complaint  Patient presents with  . Chest Pain       Brief Narrative  [name] is a 68 y.o. male with medical history significant of CAD, PAF, HTN, OSA.  Patient has been feeling well until Thursday when he was sitting on golf cart and had sudden onset light headedness, he walked a couple of steps and passed out. Woke up immediately and noticed chest pressure described as "brick sitting on my chest". This chest pressure has been constant since Thursday, slightly worsened with exertion and light headedness. Similar light headedness with syncope occurred Saturday morning. She went to the RN at his work place who told him his heart rate was "out of wack", slow and fast but he doesn't remember the exact pulse rate. He took nitroglycerin x 2 today which almost got rid of chest pain completely. He started taking his metoprolol prescribed prn on Thursday as well. In the ER he was diagnosed with COVID-19 incidental infection and sent here.   Subjective:   Patient in bed, appears comfortable, denies any headache, no fever, no chest pain or pressure, no shortness of breath , no abdominal pain. No focal weakness.    Assessment  & Plan :     1. Incidental COVID-19 infection during the ongoing 2020 Covid 19 Pandemic - clear chest x-ray, inflammatory markers unremarkable, no cough or shortness of breath afebrile do not think this is of any significance.   COVID-19 Labs  Recent Labs    06/06/19 0610 06/07/19  0212 06/08/19 0420  DDIMER 0.51* 1.24* 3.66*  CRP <0.8 0.9 1.3*    Lab Results  Component Value Date   SARSCOV2NAA POSITIVE (A) 06/05/2019     Hepatic Function Latest Ref Rng & Units 06/08/2019 06/07/2019 06/06/2019  Total Protein 6.5 - 8.1 g/dL 6.7 7.2 7.2  Albumin 3.5 - 5.0  g/dL 3.3(L) 3.7 3.7  AST 15 - 41 U/L 51(H) 72(H) 49(H)  ALT 0 - 44 U/L 59(H) 67(H) 45(H)  Alk Phosphatase 38 - 126 U/L 43 44 46  Total Bilirubin 0.3 - 1.2 mg/dL 1.3(H) 1.3(H) 1.2  Bilirubin, Direct 0.0 - 0.3 mg/dL - - -        Component Value Date/Time   BNP 17.9 06/08/2019 0420      2.  Dehydration and AKI.  Continue IV fluids, avoid nephrotoxins and monitor renal function.  3.  Syncope.  Multiple episodes.  Was clearly dehydrated and orthostatic, TED stockings applied, he has been aggressively hydrated with IV fluids, offending medications are still held, stable on telemetry with nonacute echo.  Await PT input, patient may remain orthostatic if it becomes a chronic problem despite taking interventions, he and his wife both were given instructions to minimize the chances of fall with it.  May require a walker upon discharge.  4.  Paroxysmal atrial fibrillation had vas 2 score of at least 3.  Currently on Eliquis continue, outpatient cardiology follow-up echocardiogram stable.  5.  Dyslipidemia on statin and ezetimibe.  6.  Chest pressure.  Resolved.  He is already on combination of aspirin, Eliquis, statin.  Currently chest pain-free.  Outpatient cardiology follow-up with his primary cardiologist Dr. Skeet Latch once he is discharged.  May require outpatient stress test.  Troponin negative this admission.  7.  Mild COVID-19 related transaminitis.  Stable follow trend.    Condition - Fair  Family Communication  :  Wife 06/06/19 and again in detail on 06/08/2019  Code Status :  Full   Diet :   Diet Order            Diet Heart Room service appropriate? Yes; Fluid consistency: Thin  Diet  effective now               Disposition Plan  :  Home 2-3 days  Consults  :  None  Procedures  :    TTE -   1. The left ventricle has normal systolic function, with an ejection fraction of 60-65%. The cavity size was normal. There is mildly increased left ventricular wall thickness. Left ventricular diastolic Doppler parameters are consistent with impaired  relaxation. Indeterminate filling pressures The E/e' is 8-15. No evidence of left ventricular regional wall motion abnormalities.  2. The right ventricle has normal systolc function. The cavity was normal. There is no increase in right ventricular wall thickness.  3. The mitral valve is grossly normal.  4. The tricuspid valve was grossly normal.  5. The aortic valve is tricuspid Mild sclerosis of the aortic valve. No stenosis of the aortic valve.   PUD Prophylaxis : PPI  DVT Prophylaxis  :  Eliquis  Lab Results  Component Value Date   PLT 142 (L) 06/08/2019    Inpatient Medications  Scheduled Meds: . apixaban  5 mg Oral BID  . aspirin EC  81 mg Oral QHS  . ezetimibe  10 mg Oral Daily  . finasteride  5 mg Oral Daily  . gabapentin  300 mg Oral Daily   And  . gabapentin  600 mg Oral QHS  . pantoprazole  40 mg Oral QAC breakfast  . rosuvastatin  40 mg Oral Daily  . sodium chloride flush  3 mL Intravenous Once   Continuous Infusions:  PRN Meds:.acetaminophen, chlorpheniramine-HYDROcodone, guaiFENesin-dextromethorphan, loperamide, nitroGLYCERIN, [DISCONTINUED] ondansetron **OR** ondansetron (ZOFRAN) IV, tiZANidine  Antibiotics  :    Anti-infectives (  From admission, onward)   None       Time Spent in minutes  30   Lala Lund M.D on 06/08/2019 at 9:42 AM  To page go to [email or web address] - password [facility]  [facility] -  Office  [phone]   See all Orders from today for further details    Objective:   Vitals:   06/07/19 1204 06/07/19 1648 06/07/19 2024 06/08/19 0445  BP: 124/82 140/72 120/76  109/88  Pulse: 85 81 73 67  Resp: 17 15 (!) 21 18  Temp: 98.5 F (36.9 C) 98.8 F (37.1 C) 99.1 F (37.3 C) 98.6 F (37 C)  TempSrc: Oral Oral Oral Oral  SpO2: 94% 95% 92% 91%  Weight:      Height:        Wt Readings from Last 3 Encounters:  06/06/19 88 kg  05/09/19 95.3 kg  12/22/18 93 kg     Intake/Output Summary (Last 24 hours) at 06/08/2019 0942 Last data filed at 06/08/2019 0600 Gross per 24 hour  Intake 1383.63 ml  Output 600 ml  Net 783.63 ml     Physical Exam  Awake Alert, Oriented X 3, No new F.N deficits, Normal affect [place].AT,PERRAL Supple Neck,No JVD, No cervical lymphadenopathy appriciated.  Symmetrical Chest wall movement, Good air movement bilaterally, CTAB RRR,No Gallops, Rubs or new Murmurs, No Parasternal Heave +ve B.Sounds, Abd Soft, No tenderness, No organomegaly appriciated, No rebound - guarding or rigidity. No Cyanosis, Clubbing or edema, No new Rash or bruise    Data Review:    CBC Recent Labs  Lab 06/05/19 1516 06/06/19 0610 06/07/19 0212 06/08/19 0420  WBC 3.1* 3.4* 4.4 4.6  HGB 13.9 13.3 14.7 14.2  HCT 41.9 39.3 43.5 42.2  PLT 130* 118* 122* 142*  MCV 89.3 89.3 88.6 88.7  MCH 29.6 30.2 29.9 29.8  MCHC 33.2 33.8 33.8 33.6  RDW 12.0 11.9 12.0 12.1  LYMPHSABS  --  0.8 0.6* 0.9  MONOABS  --  0.4 0.3 0.4  EOSABS  --  0.0 0.0 0.1  BASOSABS  --  0.0 0.0 0.0    Chemistries  Recent Labs  Lab 06/05/19 1516 06/05/19 1815 06/06/19 0610 06/07/19 0212 06/08/19 0420  NA 135  --  137 138 137  K 3.3*  --  3.3* 3.7 3.7  CL 96*  --  97* 102 103  CO2 26  --  '30 26 24  ' GLUCOSE 105*  --  99 101* 87  BUN 20  --  '23 19 16  ' CREATININE 1.79*  --  1.66* 1.32* 1.26*  CALCIUM 8.6*  --  8.5* 8.8* 8.4*  MG  --  2.0  --  2.0 1.9  AST  --   --  49* 72* 51*  ALT  --   --  45* 67* 59*  ALKPHOS  --   --  46 44 43  BILITOT  --   --  1.2 1.3* 1.3*    ------------------------------------------------------------------------------------------------------------------ No results for input(s): CHOL, HDL, LDLCALC, TRIG, CHOLHDL, LDLDIRECT in the last 72 hours.  Lab Results  Component Value Date   HGBA1C 6.2 04/01/2018   ------------------------------------------------------------------------------------------------------------------ Recent Labs    06/05/19 1815  TSH 2.303    Cardiac Enzymes No results for input(s): CKMB, TROPONINI, MYOGLOBIN in the last 168 hours.  Invalid input(s): CK ------------------------------------------------------------------------------------------------------------------    Component Value Date/Time   BNP 17.9 06/08/2019 0420    Micro Results Recent Results (from the past 240 hour(s))  SARS Coronavirus 2 (  Hospital order, Performed in [facility] hospital lab) Nasopharyngeal Nasopharyngeal Swab     Status: Abnormal   Collection Time: 06/05/19  8:25 PM   Specimen: Nasopharyngeal Swab  Result Value Ref Range Status   SARS Coronavirus 2 POSITIVE (A) NEGATIVE Final    Comment: RESULT CALLED TO, READ BACK BY AND VERIFIED WITH: Chalmers Cater RN 06/05/19 2134 JDW (NOTE) If result is NEGATIVE SARS-CoV-2 target nucleic acids are NOT DETECTED. The SARS-CoV-2 RNA is generally detectable in upper and lower  respiratory specimens during the acute phase of infection. The lowest  concentration of SARS-CoV-2 viral copies this assay can detect is 250  copies / mL. A negative result does not preclude SARS-CoV-2 infection  and should not be used as the sole basis for treatment or other  patient management decisions.  A negative result may occur with  improper specimen collection / handling, submission of specimen other  than nasopharyngeal swab, presence of viral mutation(s) within the  areas targeted by this assay, and inadequate number of viral copies  (<250 copies / mL). A negative result must be combined with clinical   observations, patient history, and epidemiological information. If result is POSITIVE SARS-CoV-2 target nucleic acids are DETECTED. The SARS -CoV-2 RNA is generally detectable in upper and lower  respiratory specimens during the acute phase of infection.  Positive  results are indicative of active infection with SARS-CoV-2.  Clinical  correlation with patient history and other diagnostic information is  necessary to determine patient infection status.  Positive results do  not rule out bacterial infection or co-infection with other viruses. If result is PRESUMPTIVE POSTIVE SARS-CoV-2 nucleic acids MAY BE PRESENT.   A presumptive positive result was obtained on the submitted specimen  and confirmed on repeat testing.  While 2019 novel coronavirus  (SARS-CoV-2) nucleic acids may be present in the submitted sample  additional confirmatory testing may be necessary for epidemiological  and / or clinical management purposes  to differentiate between  SARS-CoV-2 and other Sarbecovirus currently known to infect humans.  If clinically indicated additional testing with an alternate test  methodology [phone]) is advise d. The SARS-CoV-2 RNA is generally  detectable in upper and lower respiratory specimens during the acute  phase of infection. The expected result is Negative. Fact Sheet for Patients:  StrictlyIdeas.no Fact Sheet for Healthcare Providers: BankingDealers.co.za This test is not yet approved or cleared by the [place] FDA and has been authorized for detection and/or diagnosis of SARS-CoV-2 by FDA under an Emergency Use Authorization (EUA).  This EUA will remain in effect (meaning this test can be used) for the duration of the COVID-19 declaration under Section 564(b)(1) of the Act, 21 U.S.C. section 360bbb-3(b)(1), unless the authorization is terminated or revoked sooner. Performed at [facility], [place] [address]  [place], [place] [address]     Radiology Reports Dg Chest 2 View  Result Date: 06/05/2019 CLINICAL DATA:  Syncope x2 over the past week. History of atrial fibrillation. EXAM: CHEST - 2 VIEW COMPARISON:  Single-view of the chest 05/30/2018. FINDINGS: Lungs clear. Heart size normal. Aortic atherosclerosis noted. No pneumothorax or pleural fluid. No acute or focal bony abnormality. IMPRESSION: No acute disease. Atherosclerosis. Electronically Signed   By: Inge Rise M.D.   On: 06/05/2019 15:50

## 2019-06-08 NOTE — Evaluation (Signed)
Physical Therapy Evaluation Patient Details Name: [name] MRN: [id number] DOB: 1951-07-23 Today's Date: 06/08/2019   History of Present Illness  68 y.o. male admitted on 06/05/19 for chest pain and syncopal episodes.  Incidently dx with COVID 19.  He has struggled acutely with lightheadedness and diarrhea.  Dx with dehydration and AKI, PAF, chest pressure, mild COVID 19 transaminitis.  Pt with other significant PMH of MI, HTN, CAD, R CEA, bradycardia, A-fib, R knee arthroscopy, coronary angio with stent and multiple heart caths.   Clinical Impression  Pt was able to get up and ambulate around the room with mild gait instability and a bit of fear of passing out. He was mildly lightheaded, but orthostatics were negative.  HR and O2 sats stable on RA.   PT to follow acutely for deficits listed below.     06/08/19 1016  Vital Signs  Pulse Rate 79  Orthostatic Lying   BP- Lying 105/76  Pulse- Lying 75  Orthostatic Sitting  BP- Sitting 107/86  Pulse- Sitting 83  Orthostatic Standing at 0 minutes  BP- Standing at 0 minutes 106/70  Pulse- Standing at 0 minutes 85  Orthostatic Standing at 3 minutes  BP- Standing at 3 minutes (!) 115/92  Pulse- Standing at 3 minutes 91  Oxygen Therapy  SpO2 94 %  O2 Device Room Air        Follow Up Recommendations No PT follow up;Supervision for mobility/OOB    Equipment Recommendations  None recommended by PT    Recommendations for Other Services   NA    Precautions / Restrictions Precautions Precautions: None;Other (comment)      Mobility  Bed Mobility Overal bed mobility: Modified Independent                Transfers Overall transfer level: Needs assistance   Transfers: Sit to/from Stand;Stand Pivot Transfers Sit to Stand: Min guard Stand pivot transfers: Min guard       General transfer comment: Min guard assist for safety and balance as pt is mildly unsteady on his feet  Ambulation/Gait Ambulation/Gait assistance:  Min guard Gait Distance (Feet): 60 Feet(back and forth to the door x 3) Assistive device: 1 person hand held assist Gait Pattern/deviations: Step-through pattern;Staggering left;Staggering right;Shuffle Gait velocity: decreased Gait velocity interpretation: 1.31 - 2.62 ft/sec, indicative of limited community ambulator General Gait Details: Pt with short, choppy, guarded steps, anixous of feeling lightheaded and passing out.  Educated pt to stand and assess before starting to walk away from his support surface and if he feels lightheaded at all immediately sit down or lie down with his feet elevated.  Orthostatics cheched and were negative, O2 sats mid 90s on RA during gait measured via nellcor finger probe, and orthostatics taken and were negative.          Balance Overall balance assessment: Needs assistance Sitting-balance support: Feet supported;No upper extremity supported Sitting balance-Leahy Scale: Good     Standing balance support: Single extremity supported Standing balance-Leahy Scale: Fair                               Pertinent Vitals/Pain Pain Assessment: No/denies pain    Home Living Family/patient expects to be discharged to:: Private residence   Available Help at Discharge: Family;Available 24 hours/day Type of Home: House Home Access: Level entry     Home Layout: Multi-level Home Equipment: Walker - 2 wheels;Cane - single point;Bedside commode  Prior Function Level of Independence: Independent         Comments: semi retired, likes to Clinical research associate   Dominant Hand: Right    Extremity/Trunk Assessment   Upper Extremity Assessment Upper Extremity Assessment: Overall WFL for tasks assessed    Lower Extremity Assessment Lower Extremity Assessment: Overall WFL for tasks assessed    Cervical / Trunk Assessment Cervical / Trunk Assessment: Normal  Communication   Communication: No difficulties  Cognition  Arousal/Alertness: Awake/alert Behavior During Therapy: WFL for tasks assessed/performed Overall Cognitive Status: Within Functional Limits for tasks assessed                                           Exercises General Exercises - Upper Extremity Shoulder Flexion: AROM;Both;10 reps Elbow Flexion: AROM;Both Elbow Extension: AROM;Both General Exercises - Lower Extremity Long Arc Quad: AROM;Both;10 reps Toe Raises: AROM;Both;10 reps Heel Raises: AROM;Both;10 reps Other Exercises Other Exercises: HEP handout given and orange/red t band with visual demonstration of how to do exercises.  I asked pt to do them TID 10 time each, more if he is able, and to stay up in the chair as much as he can throughout the day.    Assessment/Plan    PT Assessment Patient needs continued PT services  PT Problem List Decreased activity tolerance;Decreased balance;Decreased mobility;Decreased knowledge of precautions;Cardiopulmonary status limiting activity       PT Treatment Interventions DME instruction;Gait training;Stair training;Functional mobility training;Therapeutic activities;Therapeutic exercise;Balance training;Patient/family education    PT Goals (Current goals can be found in the Care Plan section)  Acute Rehab PT Goals Patient Stated Goal: to feel better, [place] again PT Goal Formulation: With patient Time For Goal Achievement: 06/22/19 Potential to Achieve Goals: Good    Frequency Min 3X/week           AM-PAC PT "6 Clicks" Mobility  Outcome Measure Help needed turning from your back to your side while in a flat bed without using bedrails?: None Help needed moving from lying on your back to sitting on the side of a flat bed without using bedrails?: None Help needed moving to and from a bed to a chair (including a wheelchair)?: A Little Help needed standing up from a chair using your arms (e.g., wheelchair or bedside chair)?: A  Little Help needed to walk in hospital room?: A Little Help needed climbing 3-5 steps with a railing? : A Little 6 Click Score: 20    End of Session   Activity Tolerance: Patient tolerated treatment well Patient left: in chair;with call bell/phone within reach   PT Visit Diagnosis: Difficulty in walking, not elsewhere classified (R26.2)    Time: 2355-7322 PT Time Calculation (min) (ACUTE ONLY): 42 min   Charges:           Wells Guiles B. Skylarr Liz, PT, DPT  Acute Rehabilitation [phone] pager [phone]) [phone] office  @ [facility]: [phone]   PT Evaluation $PT Eval Moderate Complexity: 1 Mod PT Treatments $Gait Training: 8-22 mins $Therapeutic Exercise: 8-22 mins        06/08/2019, 11:07 AM

## 2019-06-09 ENCOUNTER — Inpatient Hospital Stay (HOSPITAL_COMMUNITY): Payer: Medicare Other

## 2019-06-09 LAB — COMPREHENSIVE METABOLIC PANEL
ALT: 98 U/L — ABNORMAL HIGH (ref 0–44)
AST: 97 U/L — ABNORMAL HIGH (ref 15–41)
Albumin: 3.2 g/dL — ABNORMAL LOW (ref 3.5–5.0)
Alkaline Phosphatase: 48 U/L (ref 38–126)
Anion gap: 7 (ref 5–15)
BUN: 17 mg/dL (ref 8–23)
CO2: 27 mmol/L (ref 22–32)
Calcium: 8.8 mg/dL — ABNORMAL LOW (ref 8.9–10.3)
Chloride: 105 mmol/L (ref 98–111)
Creatinine, Ser: 1.13 mg/dL (ref 0.61–1.24)
GFR calc Af Amer: >60 mL/min (ref >60)
GFR calc non Af Amer: >60 mL/min (ref >60)
Glucose, Bld: 85 mg/dL (ref 70–99)
Potassium: 4 mmol/L (ref 3.5–5.1)
Sodium: 139 mmol/L (ref 135–145)
Total Bilirubin: 1.1 mg/dL (ref 0.3–1.2)
Total Protein: 6.7 g/dL (ref 6.5–8.1)

## 2019-06-09 LAB — MAGNESIUM: Magnesium: 2.1 mg/dL (ref 1.7–2.4)

## 2019-06-09 LAB — CBC WITH DIFFERENTIAL/PLATELET
Abs Immature Granulocytes: 0.02 10*3/uL (ref 0.00–0.07)
Basophils Absolute: 0 10*3/uL (ref 0.0–0.1)
Basophils Relative: 1 %
Eosinophils Absolute: 0.1 10*3/uL (ref 0.0–0.5)
Eosinophils Relative: 2 %
HCT: 43.3 % (ref 39.0–52.0)
Hemoglobin: 14.6 g/dL (ref 13.0–17.0)
Immature Granulocytes: 0 %
Lymphocytes Relative: 17 %
Lymphs Abs: 0.9 10*3/uL (ref 0.7–4.0)
MCH: 30.3 pg (ref 26.0–34.0)
MCHC: 33.7 g/dL (ref 30.0–36.0)
MCV: 89.8 fL (ref 80.0–100.0)
Monocytes Absolute: 0.4 10*3/uL (ref 0.1–1.0)
Monocytes Relative: 8 %
Neutro Abs: 4 10*3/uL (ref 1.7–7.7)
Neutrophils Relative %: 72 %
Platelets: 154 10*3/uL (ref 150–400)
RBC: 4.82 MIL/uL (ref 4.22–5.81)
RDW: 12.1 % (ref 11.5–15.5)
WBC: 5.5 10*3/uL (ref 4.0–10.5)
nRBC: 0 % (ref 0.0–0.2)

## 2019-06-09 LAB — D-DIMER, QUANTITATIVE: D-Dimer, Quant: 7.71 ug/mL-FEU — ABNORMAL HIGH (ref 0.00–0.50)

## 2019-06-09 LAB — BRAIN NATRIURETIC PEPTIDE: B Natriuretic Peptide: 24.3 pg/mL (ref 0.0–100.0)

## 2019-06-09 LAB — C-REACTIVE PROTEIN: CRP: 1.6 mg/dL — ABNORMAL HIGH (ref <1.0)

## 2019-06-09 MED ORDER — LACTATED RINGERS IV SOLN
INTRAVENOUS | Status: DC
  Administered 2019-06-09: 09:00:00 via INTRAVENOUS

## 2019-06-09 MED ORDER — LACTATED RINGERS IV SOLN
INTRAVENOUS | Status: AC
  Administered 2019-06-09 – 2019-06-10 (×3): via INTRAVENOUS

## 2019-06-09 MED ORDER — SIMETHICONE 80 MG PO CHEW
80.0000 mg | CHEWABLE_TABLET | Freq: Four times a day (QID) | ORAL | Status: DC | PRN
  Administered 2019-06-09 – 2019-06-11 (×2): 80 mg via ORAL
  Filled 2019-06-09 (×5): qty 1

## 2019-06-09 NOTE — Plan of Care (Signed)
Spoke with Daughter on phone to inform of patient current status and POC - informed of current ileus.

## 2019-06-09 NOTE — Progress Notes (Signed)
PROGRESS NOTE                                                                                                                                                                                                             Patient Demographics:    [name], is a 68 y.o. male, DOB - [birth year], OVF:[id number]  Outpatient Primary MD for the patient is [name], FNP    LOS - 3  Admit date - 06/05/2019    Chief Complaint  Patient presents with  . Chest Pain       Brief Narrative  [name] is a 68 y.o. male with medical history significant of CAD, PAF, HTN, OSA.  Patient has been feeling well until Thursday when he was sitting on golf cart and had sudden onset light headedness, he walked a couple of steps and passed out. Woke up immediately and noticed chest pressure described as "brick sitting on my chest". This chest pressure has been constant since Thursday, slightly worsened with exertion and light headedness. Similar light headedness with syncope occurred Saturday morning. She went to the RN at his work place who told him his heart rate was "out of wack", slow and fast but he doesn't remember the exact pulse rate. He took nitroglycerin x 2 today which almost got rid of chest pain completely. He started taking his metoprolol prescribed prn on Thursday as well. In the ER he was diagnosed with COVID-19 incidental infection and sent here.   Subjective:   Patient in bed, appears comfortable, denies any headache, no fever, no chest pain or pressure, no shortness of breath , no abdominal pain. No focal weakness.   Assessment  & Plan :     1. Incidental COVID-19 infection during the ongoing 2020 Covid 19 Pandemic - clear chest x-ray, inflammatory markers unremarkable, no cough or shortness of breath afebrile do not think this is of any significance.   COVID-19 Labs  Recent Labs    06/07/19 0212 06/08/19 0420  06/09/19 0312  DDIMER 1.24* 3.66* 7.71*  CRP 0.9 1.3* 1.6*    Lab Results  Component Value Date   SARSCOV2NAA POSITIVE (A) 06/05/2019     Hepatic Function Latest Ref Rng & Units 06/09/2019 06/08/2019 06/07/2019  Total Protein 6.5 - 8.1 g/dL 6.7 6.7 7.2  Albumin 3.5 - 5.0 g/dL  3.2(L) 3.3(L) 3.7  AST 15 - 41 U/L 97(H) 51(H) 72(H)  ALT 0 - 44 U/L 98(H) 59(H) 67(H)  Alk Phosphatase 38 - 126 U/L 48 43 44  Total Bilirubin 0.3 - 1.2 mg/dL 1.1 1.3(H) 1.3(H)  Bilirubin, Direct 0.0 - 0.3 mg/dL - - -        Component Value Date/Time   BNP 24.3 06/09/2019 0312      2.  Dehydration and AKI.  Continue IV fluids, avoid nephrotoxins and monitor renal function.  3.  Syncope.  Multiple episodes.  Was clearly dehydrated and orthostatic, TED stockings applied, he has been aggressively hydrated with IV fluids which will be continued due to ongoing diarrhea, offending medications are still held, stable on telemetry with nonacute echo.  Await PT input, patient may remain orthostatic if it becomes a chronic problem despite taking interventions, he and his wife both were given instructions to minimize the chances of fall with it.  May require a walker upon discharge.  4.  Paroxysmal atrial fibrillation had vas 2 score of at least 3.  Currently on Eliquis continue, outpatient cardiology follow-up echocardiogram stable. He has remained stable on Tele x 3 days, DC tele, monitor clinically.  5.  Dyslipidemia on statin and ezetimibe.  6.  Chest pressure.  Resolved.  He is already on combination of aspirin, Eliquis, statin.  Currently chest pain-free.  Outpatient cardiology follow-up with his primary cardiologist Dr. Skeet Latch once he is discharged.  May require outpatient stress test.  Troponin negative this admission.  7.  Mild COVID-19 related transaminitis.  Symptom-free, mild elevation in liver enzymes continue to monitor clinically.  Post discharge repeat CMP in 7 to 10 days by PCP.  8.  COVID 19  induced diarrhea/gastroenteritis.  Severe, Imodium as needed.  Monitor for any pulmonary symptoms that may arise.  Continue IV fluids for hydration.    Condition - Fair  Family Communication  :  Wife 06/06/19 and again in detail on 06/08/2019  Code Status :  Full   Diet :   Diet Order            Diet Heart Room service appropriate? Yes; Fluid consistency: Thin  Diet effective now               Disposition Plan  :  Home 2-3 days  Consults  :  None  Procedures  :    TTE -   1. The left ventricle has normal systolic function, with an ejection fraction of 60-65%. The cavity size was normal. There is mildly increased left ventricular wall thickness. Left ventricular diastolic Doppler parameters are consistent with impaired  relaxation. Indeterminate filling pressures The E/e' is 8-15. No evidence of left ventricular regional wall motion abnormalities.  2. The right ventricle has normal systolc function. The cavity was normal. There is no increase in right ventricular wall thickness.  3. The mitral valve is grossly normal.  4. The tricuspid valve was grossly normal.  5. The aortic valve is tricuspid Mild sclerosis of the aortic valve. No stenosis of the aortic valve.   PUD Prophylaxis : PPI  DVT Prophylaxis  :  Eliquis  Lab Results  Component Value Date   PLT 154 06/09/2019    Inpatient Medications  Scheduled Meds: . apixaban  5 mg Oral BID  . aspirin EC  81 mg Oral QHS  . ezetimibe  10 mg Oral Daily  . finasteride  5 mg Oral Daily  . gabapentin  300 mg Oral  Daily   And  . gabapentin  600 mg Oral QHS  . pantoprazole  40 mg Oral QAC breakfast  . rosuvastatin  40 mg Oral Daily  . sodium chloride flush  3 mL Intravenous Once   Continuous Infusions:  PRN Meds:.acetaminophen, chlorpheniramine-HYDROcodone, guaiFENesin-dextromethorphan, loperamide, nitroGLYCERIN, [DISCONTINUED] ondansetron **OR** ondansetron (ZOFRAN) IV, tiZANidine  Antibiotics  :    Anti-infectives  (From admission, onward)   None       Time Spent in minutes  30   Lala Lund M.D on 06/09/2019 at 8:59 AM  To page go to [email or web address] - password [facility]  [facility] -  Office  [phone]   See all Orders from today for further details    Objective:   Vitals:   06/08/19 0445 06/08/19 1016 06/08/19 2041 06/09/19 0423  BP: 109/88  126/73 118/81  Pulse: 67 79 76 72  Resp: _0 Temp: 98.6 F (37 C)  98.3 F (36.8 C) 97.9 F (36.6 C)  TempSrc: Oral  Oral Oral  SpO2: 91% 94% 93% 93%  Weight:      Height:        Wt Readings from Last 3 Encounters:  06/06/19 88 kg  05/09/19 95.3 kg  12/22/18 93 kg     Intake/Output Summary (Last 24 hours) at 06/09/2019 0859 Last data filed at 06/09/2019 0400 Gross per 24 hour  Intake 1395.36 ml  Output 300 ml  Net 1095.36 ml     Physical Exam  Awake Alert, Oriented X 3, No new F.N deficits, Normal affect [place].AT,PERRAL Supple Neck,No JVD, No cervical lymphadenopathy appriciated.  Symmetrical Chest wall movement, Good air movement bilaterally, CTAB RRR,No Gallops, Rubs or new Murmurs, No Parasternal Heave +ve B.Sounds, Abd Soft, No tenderness, No organomegaly appriciated, No rebound - guarding or rigidity. No Cyanosis, Clubbing or edema, No new Rash or bruise   Data Review:    CBC Recent Labs  Lab 06/05/19 1516 06/06/19 0610 06/07/19 0212 06/08/19 0420 06/09/19 0312  WBC 3.1* 3.4* 4.4 4.6 5.5  HGB 13.9 13.3 14.7 14.2 14.6  HCT 41.9 39.3 43.5 42.2 43.3  PLT 130* 118* 122* 142* 154  MCV 89.3 89.3 88.6 88.7 89.8  MCH 29.6 30.2 29.9 29.8 30.3  MCHC 33.2 33.8 33.8 33.6 33.7  RDW 12.0 11.9 12.0 12.1 12.1  LYMPHSABS  --  0.8 0.6* 0.9 0.9  MONOABS  --  0.4 0.3 0.4 0.4  EOSABS  --  0.0 0.0 0.1 0.1  BASOSABS  --  0.0 0.0 0.0 0.0    Chemistries  Recent Labs  Lab 06/05/19 1516 06/05/19 1815 06/06/19 0610 06/07/19 0212 06/08/19 0420 06/09/19 0312  NA 135  --  137 138 137 139  K 3.3*  --  3.3* 3.7  3.7 4.0  CL 96*  --  97* 102 103 105  CO2 26  --  _1 GLUCOSE 105*  --  99 101* 87 85  BUN 20  --  _2 CREATININE 1.79*  --  1.66* 1.32* 1.26* 1.13  CALCIUM 8.6*  --  8.5* 8.8* 8.4* 8.8*  MG  --  2.0  --  2.0 1.9 2.1  AST  --   --  49* 72* 51* 97*  ALT  --   --  45* 67* 59* 98*  ALKPHOS  --   --  46 44 43 48  BILITOT  --   --  1.2 1.3* 1.3* 1.1   ------------------------------------------------------------------------------------------------------------------ No  results for input(s): CHOL, HDL, LDLCALC, TRIG, CHOLHDL, LDLDIRECT in the last 72 hours.  Lab Results  Component Value Date   HGBA1C 6.2 04/01/2018   ------------------------------------------------------------------------------------------------------------------ No results for input(s): TSH, T4TOTAL, T3FREE, THYROIDAB in the last 72 hours.  Invalid input(s): FREET3  Cardiac Enzymes No results for input(s): CKMB, TROPONINI, MYOGLOBIN in the last 168 hours.  Invalid input(s): CK ------------------------------------------------------------------------------------------------------------------    Component Value Date/Time   BNP 24.3 06/09/2019 4627    Micro Results Recent Results (from the past 240 hour(s))  SARS Coronavirus 2 [facility] order, Performed in [facility] hospital lab) Nasopharyngeal Nasopharyngeal Swab     Status: Abnormal   Collection Time: 06/05/19  8:25 PM   Specimen: Nasopharyngeal Swab  Result Value Ref Range Status   SARS Coronavirus 2 POSITIVE (A) NEGATIVE Final    Comment: RESULT CALLED TO, READ BACK BY AND VERIFIED WITH: Chalmers Cater RN 06/05/19 2134 JDW (NOTE) If result is NEGATIVE SARS-CoV-2 target nucleic acids are NOT DETECTED. The SARS-CoV-2 RNA is generally detectable in upper and lower  respiratory specimens during the acute phase of infection. The lowest  concentration of SARS-CoV-2 viral copies this assay can detect is 250  copies / mL. A negative result does not  preclude SARS-CoV-2 infection  and should not be used as the sole basis for treatment or other  patient management decisions.  A negative result may occur with  improper specimen collection / handling, submission of specimen other  than nasopharyngeal swab, presence of viral mutation(s) within the  areas targeted by this assay, and inadequate number of viral copies  (<250 copies / mL). A negative result must be combined with clinical  observations, patient history, and epidemiological information. If result is POSITIVE SARS-CoV-2 target nucleic acids are DETECTED. The SARS -CoV-2 RNA is generally detectable in upper and lower  respiratory specimens during the acute phase of infection.  Positive  results are indicative of active infection with SARS-CoV-2.  Clinical  correlation with patient history and other diagnostic information is  necessary to determine patient infection status.  Positive results do  not rule out bacterial infection or co-infection with other viruses. If result is PRESUMPTIVE POSTIVE SARS-CoV-2 nucleic acids MAY BE PRESENT.   A presumptive positive result was obtained on the submitted specimen  and confirmed on repeat testing.  While 2019 novel coronavirus  (SARS-CoV-2) nucleic acids may be present in the submitted sample  additional confirmatory testing may be necessary for epidemiological  and / or clinical management purposes  to differentiate between  SARS-CoV-2 and other Sarbecovirus currently known to infect humans.  If clinically indicated additional testing with an alternate test  methodology [phone]) is advise d. The SARS-CoV-2 RNA is generally  detectable in upper and lower respiratory specimens during the acute  phase of infection. The expected result is Negative. Fact Sheet for Patients:  StrictlyIdeas.no Fact Sheet for Healthcare Providers: BankingDealers.co.za This test is not yet approved or cleared by  the [place] FDA and has been authorized for detection and/or diagnosis of SARS-CoV-2 by FDA under an Emergency Use Authorization (EUA).  This EUA will remain in effect (meaning this test can be used) for the duration of the COVID-19 declaration under Section 564(b)(1) of the Act, 21 U.S.C. section 360bbb-3(b)(1), unless the authorization is terminated or revoked sooner. Performed at [facility], [place] [address] [place], [place] [address]     Radiology Reports Dg Chest 2 View  Result Date: 06/05/2019 CLINICAL DATA:  Syncope x2 over the  past week. History of atrial fibrillation. EXAM: CHEST - 2 VIEW COMPARISON:  Single-view of the chest 05/30/2018. FINDINGS: Lungs clear. Heart size normal. Aortic atherosclerosis noted. No pneumothorax or pleural fluid. No acute or focal bony abnormality. IMPRESSION: No acute disease. Atherosclerosis. Electronically Signed   By: Inge Rise M.D.   On: 06/05/2019 15:50

## 2019-06-09 NOTE — Evaluation (Signed)
Occupational Therapy Evaluation Patient Details Name: [name] MRN: [id number] DOB: December 30, 1950 Today's Date: 06/09/2019    History of Present Illness 68 y.o. male admitted on 06/05/19 for chest pain and syncopal episodes.  Incidently dx with COVID 19.  He has struggled acutely with lightheadedness and diarrhea.  Dx with dehydration and AKI, PAF, chest pressure, mild COVID 19 transaminitis.  Pt with other significant PMH of MI, HTN, CAD, R CEA, bradycardia, A-fib, R knee arthroscopy, coronary angio with stent and multiple heart caths.    Clinical Impression   PTA Pt independent in ADL and mobility, enjoys fishing, golf, and being outdoors. Pt today was min guard for transfers and mobility while pushing IV pole (able to perform a lap around the unit). Min guard for LB ADL from sit<>stand, min guard for standing grooming, able to manage pants, underwear and belt for toilet transfer. Pt will benefit from skilled OT in the acute setting with focus on functional tasks that challenge balance and energy conservation education to maximize safety and independence in ADL and transfers.     Follow Up Recommendations  Supervision/Assistance - 24 hour    Equipment Recommendations  3 in 1 bedside commode    Recommendations for Other Services PT consult     Precautions / Restrictions Precautions Precautions: None      Mobility Bed Mobility Overal bed mobility: Modified Independent             General bed mobility comments: in and out of bed  Transfers Overall transfer level: Needs assistance   Transfers: Sit to/from Stand Sit to Stand: Min guard         General transfer comment: pushes IV pole, mildly unsteady on feet    Balance Overall balance assessment: Needs assistance Sitting-balance support: Feet supported;No upper extremity supported Sitting balance-Leahy Scale: Good     Standing balance support: Single extremity supported Standing balance-Leahy Scale: Fair                              ADL either performed or assessed with clinical judgement   ADL Overall ADL's : Needs assistance/impaired Eating/Feeding: Independent   Grooming: Wash/dry hands;Wash/dry face;Oral care;Min guard;Standing Grooming Details (indicate cue type and reason): sink level, min guard for safety - no assist needed Upper Body Bathing: Modified independent;Sitting   Lower Body Bathing: Supervison/ safety;Sitting/lateral leans   Upper Body Dressing : Modified independent;Sitting   Lower Body Dressing: Min guard;Sit to/from stand Lower Body Dressing Details (indicate cue type and reason): able to doff/don socks without assist utilizing figure 4 method Toilet Transfer: Min guard;Ambulation Toilet Transfer Details (indicate cue type and reason): pushing IV pole Toileting- Clothing Manipulation and Hygiene: Min guard;Sit to/from stand Toileting - Clothing Manipulation Details (indicate cue type and reason): managing pants, underwear, belt     Functional mobility during ADLs: Min guard(pushing IV pole) General ADL Comments: focused on energy conservation     Vision Baseline Vision/History: Wears glasses Wears Glasses: At all times Patient Visual Report: No change from baseline       Perception     Praxis      Pertinent Vitals/Pain Pain Assessment: No/denies pain     Hand Dominance Right   Extremity/Trunk Assessment Upper Extremity Assessment Upper Extremity Assessment: Overall WFL for tasks assessed   Lower Extremity Assessment Lower Extremity Assessment: Overall WFL for tasks assessed   Cervical / Trunk Assessment Cervical / Trunk Assessment: Normal   Communication Communication  Communication: No difficulties   Cognition Arousal/Alertness: Awake/alert Behavior During Therapy: WFL for tasks assessed/performed Overall Cognitive Status: Within Functional Limits for tasks assessed                                     General Comments        Exercises     Shoulder Instructions      Home Living Family/patient expects to be discharged to:: Private residence Living Arrangements: Spouse/significant other Available Help at Discharge: Family;Available 24 hours/day Type of Home: House Home Access: Level entry     Home Layout: Multi-level Alternate Level Stairs-Number of Steps: 10 Alternate Level Stairs-Rails: Right;Left;Can reach both Bathroom Shower/Tub: Occupational psychologist: Standard     Home Equipment: Environmental consultant - 2 wheels;Cane - single point;Bedside commode          Prior Functioning/Environment Level of Independence: Independent        Comments: semi retired, likes to golf, fish, and sip coffee on the porch        OT Problem List: Decreased activity tolerance;Impaired balance (sitting and/or standing);Decreased knowledge of use of DME or AE;Decreased knowledge of precautions;Cardiopulmonary status limiting activity      OT Treatment/Interventions: Self-care/ADL training;Therapeutic exercise;DME and/or AE instruction;Energy conservation;Therapeutic activities;Patient/family education;Balance training    OT Goals(Current goals can be found in the care plan section) Acute Rehab OT Goals Patient Stated Goal: to feel better, [place] again OT Goal Formulation: With patient Time For Goal Achievement: 06/09/19 Potential to Achieve Goals: Good ADL Goals Pt Will Perform Grooming: with modified independence;standing Pt Will Transfer to Toilet: with modified independence;ambulating Pt Will Perform Toileting - Clothing Manipulation and hygiene: with modified independence;sit to/from stand Pt/caregiver will Perform Home Exercise Program: Both right and left upper extremity;With theraband;Independently;With written HEP provided Additional ADL Goal #1: Pt will verbalize at least 3 ways of conserving energy with ADL at independent level  OT Frequency: Min 3X/week    Barriers to D/C:            Co-evaluation              AM-PAC OT "6 Clicks" Daily Activity     Outcome Measure Help from another person eating meals?: None Help from another person taking care of personal grooming?: A Little Help from another person toileting, which includes using toliet, bedpan, or urinal?: A Little Help from another person bathing (including washing, rinsing, drying)?: None Help from another person to put on and taking off regular upper body clothing?: None Help from another person to put on and taking off regular lower body clothing?: A Little 6 Click Score: 21   End of Session Equipment Utilized During Treatment: Gait belt Nurse Communication: Mobility status  Activity Tolerance: Patient tolerated treatment well Patient left: in bed;with call bell/phone within reach  OT Visit Diagnosis: Unsteadiness on feet (R26.81);Muscle weakness (generalized) (M62.81)                Time: [id number] OT Time Calculation (min): 23 min Charges:  OT General Charges $OT Visit: 1 Visit OT Evaluation $OT Eval Low Complexity: 1 Low OT Treatments $Self Care/Home Management : 8-22 mins  Hulda Humphrey OTR/L Acute Rehabilitation Services Pager: [phone] Office: [place] 06/09/2019, 2:24 PM

## 2019-06-09 NOTE — Plan of Care (Addendum)
Dr. Informed of patient ABD tenderness . Patient has not had BM yet today and bowels seem to have been slowed. Patinet has ambulated several times today - including in the hall with OT.  Will continue to assess.  ABD Xray completed - Revealed ileus - NPO starting now and for tonight.  Patient informed, simethicone ordered and given.

## 2019-06-10 LAB — COMPREHENSIVE METABOLIC PANEL
ALT: 84 U/L — ABNORMAL HIGH (ref 0–44)
AST: 65 U/L — ABNORMAL HIGH (ref 15–41)
Albumin: 3.2 g/dL — ABNORMAL LOW (ref 3.5–5.0)
Alkaline Phosphatase: 58 U/L (ref 38–126)
Anion gap: 10 (ref 5–15)
BUN: 15 mg/dL (ref 8–23)
CO2: 25 mmol/L (ref 22–32)
Calcium: 8.8 mg/dL — ABNORMAL LOW (ref 8.9–10.3)
Chloride: 105 mmol/L (ref 98–111)
Creatinine, Ser: 1.01 mg/dL (ref 0.61–1.24)
GFR calc Af Amer: >60 mL/min (ref >60)
GFR calc non Af Amer: >60 mL/min (ref >60)
Glucose, Bld: 104 mg/dL — ABNORMAL HIGH (ref 70–99)
Potassium: 4 mmol/L (ref 3.5–5.1)
Sodium: 140 mmol/L (ref 135–145)
Total Bilirubin: 1.3 mg/dL — ABNORMAL HIGH (ref 0.3–1.2)
Total Protein: 7 g/dL (ref 6.5–8.1)

## 2019-06-10 LAB — CBC WITH DIFFERENTIAL/PLATELET
Abs Immature Granulocytes: 0.01 10*3/uL (ref 0.00–0.07)
Basophils Absolute: 0 10*3/uL (ref 0.0–0.1)
Basophils Relative: 0 %
Eosinophils Absolute: 0.1 10*3/uL (ref 0.0–0.5)
Eosinophils Relative: 2 %
HCT: 40.1 % (ref 39.0–52.0)
Hemoglobin: 13.6 g/dL (ref 13.0–17.0)
Immature Granulocytes: 0 %
Lymphocytes Relative: 14 %
Lymphs Abs: 0.7 10*3/uL (ref 0.7–4.0)
MCH: 30.3 pg (ref 26.0–34.0)
MCHC: 33.9 g/dL (ref 30.0–36.0)
MCV: 89.3 fL (ref 80.0–100.0)
Monocytes Absolute: 0.4 10*3/uL (ref 0.1–1.0)
Monocytes Relative: 9 %
Neutro Abs: 3.9 10*3/uL (ref 1.7–7.7)
Neutrophils Relative %: 75 %
Platelets: 166 10*3/uL (ref 150–400)
RBC: 4.49 MIL/uL (ref 4.22–5.81)
RDW: 11.9 % (ref 11.5–15.5)
WBC: 5.2 10*3/uL (ref 4.0–10.5)
nRBC: 0 % (ref 0.0–0.2)

## 2019-06-10 LAB — MAGNESIUM: Magnesium: 2.1 mg/dL (ref 1.7–2.4)

## 2019-06-10 LAB — D-DIMER, QUANTITATIVE: D-Dimer, Quant: 7.18 ug/mL-FEU — ABNORMAL HIGH (ref 0.00–0.50)

## 2019-06-10 LAB — C-REACTIVE PROTEIN: CRP: 1.4 mg/dL — ABNORMAL HIGH (ref <1.0)

## 2019-06-10 LAB — BRAIN NATRIURETIC PEPTIDE: B Natriuretic Peptide: 18.8 pg/mL (ref 0.0–100.0)

## 2019-06-10 MED ORDER — LACTATED RINGERS IV SOLN
INTRAVENOUS | Status: DC
  Administered 2019-06-11: 04:00:00 via INTRAVENOUS

## 2019-06-10 NOTE — Progress Notes (Signed)
Patient has one episode of watery stool today and reports he has not had and formed stool in days. Notified MD.

## 2019-06-10 NOTE — Plan of Care (Signed)
Discussed with patient plan of care for the evening, pain management, and daily fluid intake with teach back displayed. Patient able to update family tonight was not communicated through nursing staff at this time

## 2019-06-10 NOTE — Progress Notes (Signed)
Patient updated his family by phone

## 2019-06-10 NOTE — Progress Notes (Addendum)
Worsening abd distention and abd pain from ileus.  NPO and NGT being placed.  Update: LR at 100 cc/hr NPO except meds, had liquid BMs during day earlier today. Unable to tolerate NGT.

## 2019-06-10 NOTE — Progress Notes (Signed)
Occupational Therapy Treatment Patient Details Name: [name] MRN: [id number] DOB: 11-29-1950 Today's Date: 06/10/2019    History of present illness 68 y.o. male admitted on 06/05/19 for chest pain and syncopal episodes.  Incidently dx with COVID 19.  He has struggled acutely with lightheadedness and diarrhea.  Dx with dehydration and AKI, PAF, chest pressure, mild COVID 19 transaminitis.  Pt with other significant PMH of MI, HTN, CAD, R CEA, bradycardia, A-fib, R knee arthroscopy, coronary angio with stent and multiple heart caths.    OT comments  Pt progressing towards OT goals, presents supine in bed agreeable to therapy session. Pt tolerating functional mobility in room/hallway pushing IV pole at overall minguard assist. Pt reports mild dizziness with activity, reports does not increase with being upright or mobility progression. SpO2 >93% throughout on RA, BP 155/84 post room/hallway activity. Feel POC remains appropriate at this time. Will continue to follow while pt remains in acute setting.    Follow Up Recommendations  Supervision/Assistance - 24 hour    Equipment Recommendations  3 in 1 bedside commode          Precautions / Restrictions Precautions Precautions: None Restrictions Weight Bearing Restrictions: No       Mobility Bed Mobility Overal bed mobility: Modified Independent             General bed mobility comments: in and out of bed  Transfers Overall transfer level: Needs assistance   Transfers: Sit to/from Stand Sit to Stand: Min guard         General transfer comment: pushes IV pole, mildly unsteady on feet    Balance Overall balance assessment: Needs assistance Sitting-balance support: Feet supported;No upper extremity supported Sitting balance-Leahy Scale: Good     Standing balance support: Single extremity supported Standing balance-Leahy Scale: Fair                             ADL either performed or assessed with  clinical judgement   ADL Overall ADL's : Needs assistance/impaired                                     Functional mobility during ADLs: Min guard(pushing IV pole) General ADL Comments: pt reports completed ADL this AM, performing functional mobility in room/hallway this session with additional education on energy conservation/activity progression after return home     Vision       Perception     Praxis      Cognition Arousal/Alertness: Awake/alert Behavior During Therapy: WFL for tasks assessed/performed Overall Cognitive Status: Within Functional Limits for tasks assessed                                          Exercises     Shoulder Instructions       General Comments      Pertinent Vitals/ Pain       Pain Assessment: No/denies pain  Home Living                                          Prior Functioning/Environment              Frequency  Min 3X/week        Progress Toward Goals  OT Goals(current goals can now be found in the care plan section)  Progress towards OT goals: Progressing toward goals  Acute Rehab OT Goals Patient Stated Goal: to feel better, [place] again OT Goal Formulation: With patient Time For Goal Achievement: 06/09/19 Potential to Achieve Goals: Good ADL Goals Pt Will Perform Grooming: with modified independence;standing Pt Will Transfer to Toilet: with modified independence;ambulating Pt Will Perform Toileting - Clothing Manipulation and hygiene: with modified independence;sit to/from stand Pt/caregiver will Perform Home Exercise Program: Both right and left upper extremity;With theraband;Independently;With written HEP provided Additional ADL Goal #1: Pt will verbalize at least 3 ways of conserving energy with ADL at independent level  Plan Discharge plan remains appropriate    Co-evaluation                 AM-PAC OT "6 Clicks" Daily  Activity     Outcome Measure   Help from another person eating meals?: None Help from another person taking care of personal grooming?: A Little Help from another person toileting, which includes using toliet, bedpan, or urinal?: A Little Help from another person bathing (including washing, rinsing, drying)?: None Help from another person to put on and taking off regular upper body clothing?: None Help from another person to put on and taking off regular lower body clothing?: A Little 6 Click Score: 21    End of Session    OT Visit Diagnosis: Unsteadiness on feet (R26.81);Muscle weakness (generalized) (M62.81)   Activity Tolerance Patient tolerated treatment well   Patient Left in bed;with call bell/phone within reach   Nurse Communication Mobility status        Time: [id number] OT Time Calculation (min): 22 min  Charges: OT General Charges $OT Visit: 1 Visit OT Treatments $Therapeutic Activity: 8-22 mins  Lou Cal, OT Supplemental Rehabilitation Services Pager [phone] Office [place] 06/10/2019, 3:45 PM

## 2019-06-10 NOTE — Progress Notes (Signed)
PROGRESS NOTE                                                                                                                                                                                                             Patient Demographics:    [name], is a 68 y.o. male, DOB - 1951-03-02, PNP:[id number]  Outpatient Primary MD for the patient is [name], FNP    LOS - 4  Admit date - 06/05/2019    Chief Complaint  Patient presents with  . Chest Pain       Brief Narrative  [name] is a 68 y.o. male with medical history significant of CAD, PAF, HTN, OSA.  Patient has been feeling well until Thursday when he was sitting on golf cart and had sudden onset light headedness, he walked a couple of steps and passed out. Woke up immediately and noticed chest pressure described as "brick sitting on my chest". This chest pressure has been constant since Thursday, slightly worsened with exertion and light headedness. Similar light headedness with syncope occurred Saturday morning. She went to the RN at his work place who told him his heart rate was "out of wack", slow and fast but he doesn't remember the exact pulse rate. He took nitroglycerin x 2 today which almost got rid of chest pain completely. He started taking his metoprolol prescribed prn on Thursday as well. In the ER he was diagnosed with COVID-19 incidental infection and sent here.   Subjective:   Patient in bed, appears comfortable, denies any headache, no fever, no chest pain or pressure, no shortness of breath , no abdominal pain. No focal weakness.   Assessment  & Plan :     1. Incidental COVID-19 infection during the ongoing 2020 Covid 19 Pandemic - clear chest x-ray, inflammatory markers unremarkable, no cough or shortness of breath afebrile do not think this is of any significance.   COVID-19 Labs  Recent Labs    06/08/19 0420 06/09/19 0312  06/10/19 0130  DDIMER 3.66* 7.71* 7.18*  CRP 1.3* 1.6* 1.4*    Lab Results  Component Value Date   SARSCOV2NAA POSITIVE (A) 06/05/2019     Hepatic Function Latest Ref Rng & Units 06/10/2019 06/09/2019 06/08/2019  Total Protein 6.5 - 8.1 g/dL 7.0 6.7 6.7  Albumin 3.5 - 5.0 g/dL  3.2(L) 3.2(L) 3.3(L)  AST 15 - 41 U/L 65(H) 97(H) 51(H)  ALT 0 - 44 U/L 84(H) 98(H) 59(H)  Alk Phosphatase 38 - 126 U/L 58 48 43  Total Bilirubin 0.3 - 1.2 mg/dL 1.3(H) 1.1 1.3(H)  Bilirubin, Direct 0.0 - 0.3 mg/dL - - -        Component Value Date/Time   BNP 18.8 06/10/2019 0130      2.  Dehydration and AKI.  Continue IV fluids, avoid nephrotoxins and monitor renal function.  3.  Syncope.  Multiple episodes.  Was clearly dehydrated and orthostatic, TED stockings applied, he has been aggressively hydrated with IV fluids which will be continued due to ongoing diarrhea, offending medications are still held, stable on telemetry with nonacute echo.  Await PT input, patient may remain orthostatic if it becomes a chronic problem despite taking interventions, he and his wife both were given instructions to minimize the chances of fall with it.  May require a walker upon discharge.  4.  Paroxysmal atrial fibrillation had vas 2 score of at least 3.  Currently on Eliquis continue, outpatient cardiology follow-up echocardiogram stable. He has remained stable on Tele x 3 days, DC tele, monitor clinically.  5.  Dyslipidemia on statin and ezetimibe.  6.  Chest pressure.  Resolved.  He is already on combination of aspirin, Eliquis, statin.  Currently chest pain-free.  Outpatient cardiology follow-up with his primary cardiologist Dr. Skeet Latch once he is discharged.  May require outpatient stress test.  Troponin negative this admission.  7.  Mild COVID-19 related transaminitis.  Symptom-free, mild elevation in liver enzymes continue to monitor clinically.  Post discharge repeat CMP in 7 to 10 days by PCP.  8.  COVID  19 induced diarrhea/gastroenteritis. Resolved, had mild Ileius due to Imodium, stopped Imodium, BM on 8/22, better, clears.    Condition - Fair  Family Communication  :  Wife 06/06/19 and again in detail on 06/08/2019  Code Status :  Full   Diet :   Diet Order            Diet clear liquid Room service appropriate? Yes; Fluid consistency: Thin  Diet effective now               Disposition Plan  :  Home 2-3 days  Consults  :  None  Procedures  :    TTE -   1. The left ventricle has normal systolic function, with an ejection fraction of 60-65%. The cavity size was normal. There is mildly increased left ventricular wall thickness. Left ventricular diastolic Doppler parameters are consistent with impaired  relaxation. Indeterminate filling pressures The E/e' is 8-15. No evidence of left ventricular regional wall motion abnormalities.  2. The right ventricle has normal systolc function. The cavity was normal. There is no increase in right ventricular wall thickness.  3. The mitral valve is grossly normal.  4. The tricuspid valve was grossly normal.  5. The aortic valve is tricuspid Mild sclerosis of the aortic valve. No stenosis of the aortic valve.   PUD Prophylaxis : PPI  DVT Prophylaxis  :  Eliquis  Lab Results  Component Value Date   PLT 166 06/10/2019    Inpatient Medications  Scheduled Meds: . apixaban  5 mg Oral BID  . aspirin EC  81 mg Oral QHS  . ezetimibe  10 mg Oral Daily  . finasteride  5 mg Oral Daily  . gabapentin  300 mg Oral Daily   And  .  gabapentin  600 mg Oral QHS  . pantoprazole  40 mg Oral QAC breakfast  . rosuvastatin  40 mg Oral Daily  . sodium chloride flush  3 mL Intravenous Once   Continuous Infusions: . lactated ringers 75 mL/hr at 06/10/19 0336   PRN Meds:.acetaminophen, chlorpheniramine-HYDROcodone, guaiFENesin-dextromethorphan, nitroGLYCERIN, [DISCONTINUED] ondansetron **OR** ondansetron (ZOFRAN) IV, simethicone, tiZANidine   Antibiotics  :    Anti-infectives (From admission, onward)   None       Time Spent in minutes  30   Lala Lund M.D on 06/10/2019 at 8:45 AM  To page go to [email or web address] - password [email or web address]  [facility] -  Office  [phone]   See all Orders from today for further details    Objective:   Vitals:   06/10/19 0330 06/10/19 0521 06/10/19 0822 06/10/19 0830  BP: 137/78  (!) 143/82 134/82  Pulse: 72 74 69 75  Resp:      Temp:   98.1 F (36.7 C)   TempSrc:      SpO2: (!) 89% 92% 94% 93%  Weight:      Height:        Wt Readings from Last 3 Encounters:  06/06/19 88 kg  05/09/19 95.3 kg  12/22/18 93 kg     Intake/Output Summary (Last 24 hours) at 06/10/2019 0845 Last data filed at 06/10/2019 0400 Gross per 24 hour  Intake 1512.41 ml  Output 500 ml  Net 1012.41 ml     Physical Exam  Awake Alert, Oriented X 3, No new F.N deficits, Normal affect [place].AT,PERRAL Supple Neck,No JVD, No cervical lymphadenopathy appriciated.  Symmetrical Chest wall movement, Good air movement bilaterally, CTAB RRR,No Gallops, Rubs or new Murmurs, No Parasternal Heave +ve B.Sounds, Abd Soft with mild distension, No tenderness, No organomegaly appriciated, No rebound - guarding or rigidity. No Cyanosis, Clubbing or edema, No new Rash or bruise    Data Review:    CBC Recent Labs  Lab 06/06/19 0610 06/07/19 0212 06/08/19 0420 06/09/19 0312 06/10/19 0130  WBC 3.4* 4.4 4.6 5.5 5.2  HGB 13.3 14.7 14.2 14.6 13.6  HCT 39.3 43.5 42.2 43.3 40.1  PLT 118* 122* 142* 154 166  MCV 89.3 88.6 88.7 89.8 89.3  MCH 30.2 29.9 29.8 30.3 30.3  MCHC 33.8 33.8 33.6 33.7 33.9  RDW 11.9 12.0 12.1 12.1 11.9  LYMPHSABS 0.8 0.6* 0.9 0.9 0.7  MONOABS 0.4 0.3 0.4 0.4 0.4  EOSABS 0.0 0.0 0.1 0.1 0.1  BASOSABS 0.0 0.0 0.0 0.0 0.0    Chemistries  Recent Labs  Lab 06/05/19 1815 06/06/19 0610 06/07/19 0212 06/08/19 0420 06/09/19 0312 06/10/19 0130  NA  --  137 138 137 139 140  K  --  3.3*  3.7 3.7 4.0 4.0  CL  --  97* 102 103 105 105  CO2  --  '30 26 24 27 25  ' GLUCOSE  --  99 101* 87 85 104*  BUN  --  '23 19 16 17 15  ' CREATININE  --  1.66* 1.32* 1.26* 1.13 1.01  CALCIUM  --  8.5* 8.8* 8.4* 8.8* 8.8*  MG 2.0  --  2.0 1.9 2.1 2.1  AST  --  49* 72* 51* 97* 65*  ALT  --  45* 67* 59* 98* 84*  ALKPHOS  --  46 44 43 48 58  BILITOT  --  1.2 1.3* 1.3* 1.1 1.3*   ------------------------------------------------------------------------------------------------------------------ No results for input(s): CHOL, HDL, LDLCALC, TRIG, CHOLHDL, LDLDIRECT in the last 72 hours.  Lab Results  Component Value Date   HGBA1C 6.2 04/01/2018   ------------------------------------------------------------------------------------------------------------------ No results for input(s): TSH, T4TOTAL, T3FREE, THYROIDAB in the last 72 hours.  Invalid input(s): FREET3  Cardiac Enzymes No results for input(s): CKMB, TROPONINI, MYOGLOBIN in the last 168 hours.  Invalid input(s): CK ------------------------------------------------------------------------------------------------------------------    Component Value Date/Time   BNP 18.8 06/10/2019 0130    Micro Results Recent Results (from the past 240 hour(s))  SARS Coronavirus 2 [facility] order, Performed in [facility] hospital lab) Nasopharyngeal Nasopharyngeal Swab     Status: Abnormal   Collection Time: 06/05/19  8:25 PM   Specimen: Nasopharyngeal Swab  Result Value Ref Range Status   SARS Coronavirus 2 POSITIVE (A) NEGATIVE Final    Comment: RESULT CALLED TO, READ BACK BY AND VERIFIED WITH: Chalmers Cater RN 06/05/19 2134 JDW (NOTE) If result is NEGATIVE SARS-CoV-2 target nucleic acids are NOT DETECTED. The SARS-CoV-2 RNA is generally detectable in upper and lower  respiratory specimens during the acute phase of infection. The lowest  concentration of SARS-CoV-2 viral copies this assay can detect is 250  copies / mL. A negative result does not  preclude SARS-CoV-2 infection  and should not be used as the sole basis for treatment or other  patient management decisions.  A negative result may occur with  improper specimen collection / handling, submission of specimen other  than nasopharyngeal swab, presence of viral mutation(s) within the  areas targeted by this assay, and inadequate number of viral copies  (<250 copies / mL). A negative result must be combined with clinical  observations, patient history, and epidemiological information. If result is POSITIVE SARS-CoV-2 target nucleic acids are DETECTED. The SARS -CoV-2 RNA is generally detectable in upper and lower  respiratory specimens during the acute phase of infection.  Positive  results are indicative of active infection with SARS-CoV-2.  Clinical  correlation with patient history and other diagnostic information is  necessary to determine patient infection status.  Positive results do  not rule out bacterial infection or co-infection with other viruses. If result is PRESUMPTIVE POSTIVE SARS-CoV-2 nucleic acids MAY BE PRESENT.   A presumptive positive result was obtained on the submitted specimen  and confirmed on repeat testing.  While 2019 novel coronavirus  (SARS-CoV-2) nucleic acids may be present in the submitted sample  additional confirmatory testing may be necessary for epidemiological  and / or clinical management purposes  to differentiate between  SARS-CoV-2 and other Sarbecovirus currently known to infect humans.  If clinically indicated additional testing with an alternate test  methodology [id number]) is advise d. The SARS-CoV-2 RNA is generally  detectable in upper and lower respiratory specimens during the acute  phase of infection. The expected result is Negative. Fact Sheet for Patients:  StrictlyIdeas.no Fact Sheet for Healthcare Providers: BankingDealers.co.za This test is not yet approved or cleared by  the [place] FDA and has been authorized for detection and/or diagnosis of SARS-CoV-2 by FDA under an Emergency Use Authorization (EUA).  This EUA will remain in effect (meaning this test can be used) for the duration of the COVID-19 declaration under Section 564(b)(1) of the Act, 21 U.S.C. section 360bbb-3(b)(1), unless the authorization is terminated or revoked sooner. Performed at [facility], [place] [address], [place], [place] [address]     Radiology Reports Dg Chest 2 View  Result Date: 06/05/2019 CLINICAL DATA:  Syncope x2 over the past week. History of atrial fibrillation. EXAM: CHEST - 2 VIEW COMPARISON:  Single-view of  the chest 05/30/2018. FINDINGS: Lungs clear. Heart size normal. Aortic atherosclerosis noted. No pneumothorax or pleural fluid. No acute or focal bony abnormality. IMPRESSION: No acute disease. Atherosclerosis. Electronically Signed   By: Inge Rise M.D.   On: 06/05/2019 15:50   Dg Abd Portable 1v  Result Date: 06/09/2019 CLINICAL DATA:  Encounter for abdominal pian and tightness that began yesterday night EXAM: PORTABLE ABDOMEN - 1 VIEW COMPARISON:  CT 12/13/2018 FINDINGS: There is disproportionate dilatation of small bowel relative to large bowel loops. No evidence for free intraperitoneal air on these supine views performed. IMPRESSION: Early or partial small bowel obstruction. Electronically Signed   By: Nolon Nations M.D.   On: 06/09/2019 17:23

## 2019-06-11 ENCOUNTER — Encounter (HOSPITAL_COMMUNITY): Payer: Self-pay | Admitting: Radiology

## 2019-06-11 ENCOUNTER — Inpatient Hospital Stay (HOSPITAL_COMMUNITY): Payer: Medicare Other

## 2019-06-11 LAB — BASIC METABOLIC PANEL
Anion gap: 10 (ref 5–15)
BUN: 14 mg/dL (ref 8–23)
CO2: 24 mmol/L (ref 22–32)
Calcium: 8.6 mg/dL — ABNORMAL LOW (ref 8.9–10.3)
Chloride: 105 mmol/L (ref 98–111)
Creatinine, Ser: 1.01 mg/dL (ref 0.61–1.24)
GFR calc Af Amer: >60 mL/min (ref >60)
GFR calc non Af Amer: >60 mL/min (ref >60)
Glucose, Bld: 86 mg/dL (ref 70–99)
Potassium: 4 mmol/L (ref 3.5–5.1)
Sodium: 139 mmol/L (ref 135–145)

## 2019-06-11 LAB — MRSA PCR SCREENING: MRSA by PCR: NEGATIVE

## 2019-06-11 MED ORDER — LIDOCAINE 5 % EX OINT: TOPICAL_OINTMENT | Freq: Once | CUTANEOUS | Status: DC

## 2019-06-11 MED ORDER — CHLORHEXIDINE GLUCONATE CLOTH 2 % EX PADS
6.0000 | MEDICATED_PAD | Freq: Every day | CUTANEOUS | Status: DC
  Administered 2019-06-11 – 2019-06-22 (×8): 6 via TOPICAL

## 2019-06-11 MED ORDER — PHENOL 1.4 % MT LIQD: 1.0000 | OROMUCOSAL | Status: DC | PRN

## 2019-06-11 MED ORDER — LORAZEPAM 2 MG/ML IJ SOLN
1.0000 mg | Freq: Once | INTRAMUSCULAR | Status: AC
  Administered 2019-06-11: 1 mg via INTRAVENOUS
  Filled 2019-06-11 (×2): qty 1

## 2019-06-11 MED ORDER — LIDOCAINE VISCOUS HCL 2 % MT SOLN
OROMUCOSAL | Status: AC
  Filled 2019-06-11: qty 15

## 2019-06-11 MED ORDER — PANTOPRAZOLE SODIUM 40 MG IV SOLR
40.0000 mg | INTRAVENOUS | Status: DC
  Administered 2019-06-11 – 2019-06-22 (×12): 40 mg via INTRAVENOUS
  Filled 2019-06-11 (×12): qty 40

## 2019-06-11 MED ORDER — HEPARIN (PORCINE) 25000 UT/250ML-% IV SOLN
1100.0000 [IU]/h | INTRAVENOUS | Status: DC
  Administered 2019-06-11: 1100 [IU]/h via INTRAVENOUS
  Filled 2019-06-11 (×2): qty 250

## 2019-06-11 MED ORDER — LIDOCAINE VISCOUS HCL 2 % MT SOLN
15.0000 mL | Freq: Once | OROMUCOSAL | Status: AC
  Administered 2019-06-11: 15 mL via OROMUCOSAL
  Filled 2019-06-11: qty 15

## 2019-06-11 MED ORDER — IOHEXOL 300 MG/ML  SOLN
80.0000 mL | Freq: Once | INTRAMUSCULAR | Status: AC | PRN
  Administered 2019-06-11: 80 mL via INTRAVENOUS

## 2019-06-11 MED ORDER — PHENOL 1.4 % MT LIQD
1.0000 | OROMUCOSAL | Status: DC | PRN
  Administered 2019-06-13: 05:00:00 1 via OROMUCOSAL
  Filled 2019-06-11 (×2): qty 177

## 2019-06-11 MED ORDER — LACTATED RINGERS IV SOLN
INTRAVENOUS | Status: DC
  Administered 2019-06-11 (×2): via INTRAVENOUS

## 2019-06-11 NOTE — Consult Note (Signed)
Reason for Consult: abdominal pain Referring Physician: Virden Mclafferty is an 68 y.o. male.  HPI: 68 yo male admitted to hospital 8/17 for syncope/dehydration, found to have COVID-19 positivity. He developed diarrhea and was diagnosed with COVID induced gastroenteritis and started on imodium. The diarrhea resolved and he became more distended and worse abdominal pain. He had flatus this morning and a bowel movement yesterday. He had attempted NG tube but was not able to tolerate procedure. He was transferred to [facility] for further evaluation.  Past Medical History:  Diagnosis Date  . Atrial fibrillation ([place])   . Bradycardia   . Carotid artery disease ([place])    Right CEA;  dopplers 6/12:  0-39% bilat ICA  . Coronary artery disease    s/p BMS to RCA 2002; [place] 2008: oLAD 40%, pRCA stent ok with 20%, EF 60%); Myoview scan in March 2011 which was negative for ischemia   . GERD (gastroesophageal reflux disease)   . Hyperlipidemia   . Hypertension   . Myocardial infarction ([place])   . OSA on CPAP     Past Surgical History:  Procedure Laterality Date  . CARDIAC CATHETERIZATION  06/01/2007    EF of 65% -- Nonobstructive CAD with 40% ostial stenosis in the LAD, no significant obstruction in the circumflex artery, 20% narrowing within the stent in the proximal to mid right coronary and normal LV function  . CARDIAC CATHETERIZATION  02/02/2006   EF of 50%  . CARDIAC CATHETERIZATION  01/19/2002   EF of 60%  . CARDIAC CATHETERIZATION N/A 12/03/2015   Procedure: Left Heart Cath and Coronary Angiography;  Surgeon: Burnell Blanks, MD;  Location: [facility];  Service: Cardiovascular;  Laterality: N/A;  . CAROTID ENDARTERECTOMY  06/26/2002   right  . CORONARY ANGIOPLASTY WITH STENT PLACEMENT  03/08/2000   Bare-metal stent in RCA, in [place]  . KNEE ARTHROSCOPY     right  . MOLE REMOVAL     Prior moles removed    Family History  Problem Relation Age of Onset  .  Heart failure Father   . Heart disease Father   . Peripheral vascular disease Mother        with pacemaker, and had a carotid endarterectomy  . Heart disease Mother   . Hypertension Brother   . Hypertension Brother     Social History:  reports that he quit smoking about 18 years ago. His smoking use included cigarettes. He has a 30.00 pack-year smoking history. His smokeless tobacco use includes snuff. He reports current alcohol use of about 1.0 standard drinks of alcohol per week. He reports that he does not use drugs.  Allergies:  Allergies  Allergen Reactions  . Felodipine Swelling    Legs became swollen  . Niacin Other (See Comments)    Made patient feel flushed    Medications: I have reviewed the patient's current medications.  Results for orders placed or performed during the hospital encounter of 06/05/19 (from the past 48 hour(s))  Brain natriuretic peptide     Status: None   Collection Time: 06/10/19  1:30 AM  Result Value Ref Range   B Natriuretic Peptide 18.8 0.0 - 100.0 pg/mL    Comment: Performed at [facility], [place] [address], [place], [place] [address]  CBC with Differential/Platelet     Status: None   Collection Time: 06/10/19  1:30 AM  Result Value Ref Range   WBC 5.2 4.0 - 10.5 K/uL  RBC 4.49 4.22 - 5.81 MIL/uL   Hemoglobin 13.6 13.0 - 17.0 g/dL   HCT 40.1 39.0 - 52.0 %   MCV 89.3 80.0 - 100.0 fL   MCH 30.3 26.0 - 34.0 pg   MCHC 33.9 30.0 - 36.0 g/dL   RDW 11.9 11.5 - 15.5 %   Platelets 166 150 - 400 K/uL   nRBC 0.0 0.0 - 0.2 %   Neutrophils Relative % 75 %   Neutro Abs 3.9 1.7 - 7.7 K/uL   Lymphocytes Relative 14 %   Lymphs Abs 0.7 0.7 - 4.0 K/uL   Monocytes Relative 9 %   Monocytes Absolute 0.4 0.1 - 1.0 K/uL   Eosinophils Relative 2 %   Eosinophils Absolute 0.1 0.0 - 0.5 K/uL   Basophils Relative 0 %   Basophils Absolute 0.0 0.0 - 0.1 K/uL   Immature Granulocytes 0 %   Abs Immature Granulocytes 0.01 0.00 - 0.07 K/uL     Comment: Performed at [facility], [place] [address] [place], [place] [address]  Comprehensive metabolic panel     Status: Abnormal   Collection Time: 06/10/19  1:30 AM  Result Value Ref Range   Sodium 140 135 - 145 mmol/L   Potassium 4.0 3.5 - 5.1 mmol/L   Chloride 105 98 - 111 mmol/L   CO2 25 22 - 32 mmol/L   Glucose, Bld 104 (H) 70 - 99 mg/dL   BUN 15 8 - 23 mg/dL   Creatinine, Ser 1.01 0.61 - 1.24 mg/dL   Calcium 8.8 (L) 8.9 - 10.3 mg/dL   Total Protein 7.0 6.5 - 8.1 g/dL   Albumin 3.2 (L) 3.5 - 5.0 g/dL   AST 65 (H) 15 - 41 U/L   ALT 84 (H) 0 - 44 U/L   Alkaline Phosphatase 58 38 - 126 U/L   Total Bilirubin 1.3 (H) 0.3 - 1.2 mg/dL   GFR calc non Af Amer >60 >60 mL/min   GFR calc Af Amer >60 >60 mL/min   Anion gap 10 5 - 15    Comment: Performed at [facility], [place] [address], [place], [place] [address]  C-reactive protein     Status: Abnormal   Collection Time: 06/10/19  1:30 AM  Result Value Ref Range   CRP 1.4 (H) <1.0 mg/dL    Comment: Performed at [facility], [place] [address], [place], [place] [address]  Magnesium     Status: None   Collection Time: 06/10/19  1:30 AM  Result Value Ref Range   Magnesium 2.1 1.7 - 2.4 mg/dL    Comment: Performed at [facility], [place] [address], [place], [place] [address]  D-dimer, quantitative (not at [facility])     Status: Abnormal   Collection Time: 06/10/19  1:30 AM  Result Value Ref Range   D-Dimer, Quant 7.18 (H) 0.00 - 0.50 ug/mL-FEU    Comment: (NOTE) At the manufacturer cut-off of 0.50 ug/mL FEU, this assay has been documented to exclude PE with a sensitivity and negative predictive value of 97 to 99%.  At this time, this assay has not been approved by the FDA to exclude DVT/VTE. Results should be correlated with clinical presentation. Performed at [facility], [place] [address] [place], [place] [id number]   Basic metabolic panel      Status: Abnormal   Collection Time: 06/11/19  3:40 AM  Result Value Ref Range   Sodium 139 135 - 145 mmol/L  Potassium 4.0 3.5 - 5.1 mmol/L   Chloride 105 98 - 111 mmol/L   CO2 24 22 - 32 mmol/L   Glucose, Bld 86 70 - 99 mg/dL   BUN 14 8 - 23 mg/dL   Creatinine, Ser 1.01 0.61 - 1.24 mg/dL   Calcium 8.6 (L) 8.9 - 10.3 mg/dL   GFR calc non Af Amer >60 >60 mL/min   GFR calc Af Amer >60 >60 mL/min   Anion gap 10 5 - 15    Comment: Performed at [facility], [place] [address] [place], [place] [address]    Dg Abd Portable 1v  Result Date: 06/11/2019 CLINICAL DATA:  Small bowel obstruction EXAM: PORTABLE ABDOMEN - 1 VIEW COMPARISON:  06/09/2019 FINDINGS: There is persistent significant dilatation of small bowel loops, disproportionate to the dilatation of large bowel loops and consistent with small bowel obstruction. The degree of bowel distention has increased since previous exam, a small polyps measuring up to 5.3 centimeters. No evidence for free intraperitoneal air. IMPRESSION: Persistent small bowel obstruction. Increased caliber of small bowel loops. Electronically Signed   By: Nolon Nations M.D.   On: 06/11/2019 08:52   Dg Abd Portable 1v  Result Date: 06/09/2019 CLINICAL DATA:  Encounter for abdominal pian and tightness that began yesterday night EXAM: PORTABLE ABDOMEN - 1 VIEW COMPARISON:  CT 12/13/2018 FINDINGS: There is disproportionate dilatation of small bowel relative to large bowel loops. No evidence for free intraperitoneal air on these supine views performed. IMPRESSION: Early or partial small bowel obstruction. Electronically Signed   By: Nolon Nations M.D.   On: 06/09/2019 17:23    Review of Systems  Constitutional: Negative for chills and fever.  HENT: Negative for hearing loss.   Eyes: Negative for blurred vision and double vision.  Respiratory: Negative for cough and hemoptysis.   Cardiovascular: Negative for chest pain and palpitations.   Gastrointestinal: Positive for abdominal pain, diarrhea, nausea and vomiting.  Genitourinary: Negative for dysuria and urgency.  Musculoskeletal: Negative for myalgias and neck pain.  Skin: Negative for itching and rash.  Neurological: Negative for dizziness, tingling and headaches.  Endo/Heme/Allergies: Does not bruise/bleed easily.  Psychiatric/Behavioral: Negative for depression and suicidal ideas.   Blood pressure (!) 153/88, pulse 86, temperature 98.7 F (37.1 C), temperature source Oral, resp. rate (!) 21, height 6\' 1"  (1.854 m), weight 88 kg, SpO2 95 %. Physical Exam  Vitals reviewed. Constitutional: He is oriented to person, place, and time. He appears well-developed and well-nourished.  HENT:  Head: Normocephalic and atraumatic.  Eyes: Pupils are equal, round, and reactive to light. Conjunctivae and EOM are normal.  Neck: Normal range of motion. Neck supple.  Cardiovascular: Normal rate and regular rhythm.  Respiratory: Effort normal and breath sounds normal.  GI: Soft. He exhibits distension. There is abdominal tenderness.  Musculoskeletal: Normal range of motion.  Neurological: He is alert and oriented to person, place, and time.  Skin: Skin is warm and dry.  Psychiatric: He has a normal mood and affect. His behavior is normal.    Assessment/Plan: 68 yo male with syncope who has developed dehydration and COVID related gastroenteritis and then became distended after 3 4mg  doses of imodium given. He is having bowel movements now, but very distended. XR with dilated loops of small intestine. -Fluoro for placement of NG tube -CT to evaluate cause of distension for possible bowel obstruction vs medication induced ileus  Arta Bruce  06/11/2019, 1:53 PM

## 2019-06-11 NOTE — Progress Notes (Signed)
MD on call notified that pt. and daughter Crystal that I spoke with on the phone both requesting follow up as it related to placing NG tube as pt. Abdominal pain was not getting any better, pt abd distended and tender. Pt unable to eat and stated he had liquid stools on day shift, bowels sounds hypoactive. NG tube attempted times 3, 2 times to rt nare, 1 to left nare pt. By 3 nurses Regulatory affairs officer, Traci RN, Tyler,RN) Unable to tolerate. MD updated, pt. To remain NPO, fluids increased to 100 ml, pt. Aware of current plan.

## 2019-06-11 NOTE — Progress Notes (Addendum)
68 yo M admitted for syncope with incidental covid, developed ileus.  Transferred to [facility] for NGT placement since he couldn't tolerated it over here.  Due to lack of floor isolation beds, patient currently on 15M (ICU) at [facility] but does not need ICU level care.  NGT in place, CT scan shows ileus.  Will transfer patient back to [facility] to free up the ICU bed.  Spoke with Dr. Kieth Brightly as well about this plan and he was okay with it.

## 2019-06-11 NOTE — Plan of Care (Addendum)
Discussed with charge nurse concerns regarding patient's abdominal pain and distention and overnight events to coordinate calling DR Candiss Norse for possible transfer for NG tube placement in IR. Dr Candiss Norse notified. Charge nurse to call [employer] at [facility]. Patient to be transferred ASAP for NG tube placement under radiology. Patient updated on plan. Called patient's daughter and updated her on plan. Patient and his daughter are OK with this. Will update patient and daughter with time and location of transfer.   09:30 MD attempted to place NG tube, patient became tachycardic in the 130's O2 saturations dropped to 92%. Patient placed on nasal cannula at 2liters. Patient began vomiting. NG tube retracted by MD before syringe could be placed to verify placement.   11:15 Report called to Shea Stakes, RN at [facility]   11:20 Updated patient's daughter Donella Stade regarding planned transfer to [facility] and updated her regarding patient's condition.   11:53 [facility] at bedside to transport patient to [facility]. Updated patient's daughter Crystal.

## 2019-06-11 NOTE — Progress Notes (Signed)
ANTICOAGULATION CONSULT NOTE - Initial Consult  Pharmacy Consult for heparin Indication: atrial fibrillation  Allergies  Allergen Reactions  . Felodipine Swelling    Legs became swollen  . Niacin Other (See Comments)    Made patient feel flushed    Patient Measurements: Height: 6\' 1"  (185.4 cm) Weight: 194 lb 1.6 oz (88 kg) IBW/kg (Calculated) : 79.9 Heparin Dosing Weight: 88 kg   Vital Signs: Temp: 98.5 F (36.9 C) (08/23 0726) Temp Source: Oral (08/23 0726) BP: 149/97 (08/23 0726) Pulse Rate: 87 (08/23 0726)  Labs: Recent Labs    06/09/19 0312 06/10/19 0130 06/11/19 0340  HGB 14.6 13.6  --   HCT 43.3 40.1  --   PLT 154 166  --   CREATININE 1.13 1.01 1.01    Estimated Creatinine Clearance: 81.3 mL/min (by C-G formula based on SCr of 1.01 mg/dL).   Medical History: Past Medical History:  Diagnosis Date  . Atrial fibrillation ([place])   . Bradycardia   . Carotid artery disease ([place])    Right CEA;  dopplers 6/12:  0-39% bilat ICA  . Coronary artery disease    s/p BMS to RCA 2002; [place] 2008: oLAD 40%, pRCA stent ok with 20%, EF 60%); Myoview scan in March 2011 which was negative for ischemia   . GERD (gastroesophageal reflux disease)   . Hyperlipidemia   . Hypertension   . Myocardial infarction ([place])   . OSA on CPAP     Medications:  Medications Prior to Admission  Medication Sig Dispense Refill Last Dose  . amLODipine (NORVASC) 10 MG tablet Take 1 tablet by mouth daily. Needs visit for future refills. (Patient taking differently: Take 10 mg by mouth daily. ) 90 tablet 0 06/05/2019 at Unknown time  . aspirin 81 MG tablet Take 81 mg by mouth at bedtime.    06/04/2019 at 2100  . b complex vitamins capsule Take 1 capsule by mouth daily.   06/05/2019 at Unknown time  . doxazosin (CARDURA) 2 MG tablet Take 1 tablet (2 mg total) by mouth daily. (Patient taking differently: Take 2 mg by mouth at bedtime. ) 180 tablet 1 06/04/2019 at pm  . ELIQUIS 5 MG TABS tablet TAKE 1  TABLET BY MOUTH TWO  TIMES DAILY (Patient taking differently: Take 5 mg by mouth 2 (two) times daily. ) 180 tablet 2 06/05/2019 at 0800  . ezetimibe (ZETIA) 10 MG tablet Take 1 tablet (10 mg total) by mouth daily. 90 tablet 0 06/05/2019 at Unknown time  . finasteride (PROSCAR) 5 MG tablet Take 5 mg by mouth daily.    06/05/2019 at Unknown time  . gabapentin (NEURONTIN) 300 MG capsule Take 1 tablet in the morning and 2 tablet at bedtime (Patient taking differently: Take 300-600 mg by mouth See admin instructions. Take 300 mg by mouth in the morning and 600 mg at bedtime) 270 capsule 3 06/05/2019 at am  . hydrochlorothiazide (HYDRODIURIL) 50 MG tablet Take 0.5 tablets (25 mg total) by mouth daily. Needs visit for future refills. (Patient taking differently: Take 25 mg by mouth daily. ) 45 tablet 0 06/05/2019 at am  . isosorbide mononitrate (IMDUR) 30 MG 24 hr tablet Take 1 tablet (30 mg total) by mouth daily. Needs visit for future refills. (Patient taking differently: Take 30 mg by mouth daily. ) 90 tablet 0 06/05/2019 at am  . metoprolol tartrate (LOPRESSOR) 25 MG tablet Take 1 tablet (25 mg total) by mouth daily as needed (as needed for palpitations/afib.). 60 tablet 11  06/05/2019 at 0800  . nitroGLYCERIN (NITROSTAT) 0.4 MG SL tablet Place 1 tablet (0.4 mg total) under the tongue every 5 (five) minutes as needed. Call 9-1-1 if need more than 2. 25 tablet 0 06/05/2019 at Unknown time  . pantoprazole (PROTONIX) 40 MG tablet TAKE 1 TABLET BY MOUTH  EVERY MORNING. (Patient taking differently: Take 40 mg by mouth daily before breakfast. ) 90 tablet 3 06/05/2019 at am  . potassium chloride (K-DUR,KLOR-CON) 10 MEQ tablet TAKE 1 TABLET(10 MEQ) BY MOUTH DAILY (Patient taking differently: Take 10 mEq by mouth daily. ) 90 tablet 0 06/05/2019 at am  . ramipril (ALTACE) 10 MG capsule TAKE 1 CAPSULE BY MOUTH  TWICE DAILY (Patient taking differently: Take 10 mg by mouth 2 (two) times daily. ) 180 capsule 3 06/05/2019 at am  .  rosuvastatin (CRESTOR) 40 MG tablet Take 1 tablet (40 mg total) by mouth daily. Needs visit for future refills. (Patient taking differently: Take 40 mg by mouth daily. ) 90 tablet 0 06/05/2019 at See note  . tiZANidine (ZANAFLEX) 2 MG tablet Take one tablet at bedtime as needed for pain, if tolerating, ok to take twice daily as needed (Patient taking differently: Take 2 mg by mouth 2 (two) times daily as needed for muscle spasms. Take one tablet at bedtime as needed for pain, if tolerating, ok to take twice daily as needed) 45 tablet 3 ask!!    Assessment: 67 YOF with Afib and CHADSVASc score 3 on apixaban at home. Pharmacy consulted to transition to IV heparin. Apixaban has been discontinued and the last inpatient apixaban dose was yesterday night. H/H and Plt have been wnl since admission.   Goal of Therapy:  Heparin level 0.3-0.7 units/ml aPTT 66-102 seconds Monitor platelets by anticoagulation protocol: Yes   Plan:  -Start IV heparin at 1100 units/hr at 0930 today (12 hours after last apixaban dose)  -F/u 6hr aPTT -Monitor daily HL, aPTT, CBC and s/s of bleeding  Albertina Parr, PharmD., BCPS Clinical Pharmacist Clinical phone for 06/11/19 until 5pm: [phone]

## 2019-06-11 NOTE — Plan of Care (Signed)
Discussed with pt. And family member pain interventions in current setting of abd pain. Followed up with MD and new orders received, will continue to monitor pt.

## 2019-06-11 NOTE — Plan of Care (Signed)
  Problem: Education: Goal: Knowledge of risk factors and measures for prevention of condition will improve Outcome: Progressing   Problem: Coping: Goal: Psychosocial and spiritual needs will be supported Outcome: Progressing   Problem: Clinical Measurements: Goal: Will remain free from infection Outcome: Progressing   Problem: Activity: Goal: Risk for activity intolerance will decrease Outcome: Progressing

## 2019-06-11 NOTE — Progress Notes (Signed)
Cardiac Monitoring Continuous x 24 hours Indications for use: Syncope of unknown etiology (Order [id number]) Nursing Discontinued Date: 06/07/2019 Department: [facility] Ordering/Authorizing: Thurnell Lose, MD   Thurnell Lose, MD NPI: [id number]     Telemetry called again at shift change questioning order for cardiac monitoring.  It has been discontinued previously (see above order).

## 2019-06-11 NOTE — Progress Notes (Addendum)
PROGRESS NOTE                                                                                                                                                                                                             Patient Demographics:    [name], is a 68 y.o. male, DOB - [birth year], OFB:[id number]  Outpatient Primary MD for the patient is [name], FNP    LOS - 5  Admit date - 06/05/2019    Chief Complaint  Patient presents with  . Chest Pain       Brief Narrative  [name] is a 68 y.o. male with medical history significant of CAD, PAF, HTN, OSA.  Patient has been feeling well until Thursday when he was sitting on golf cart and had sudden onset light headedness, he walked a couple of steps and passed out. Woke up immediately and noticed chest pressure described as "brick sitting on my chest". This chest pressure has been constant since Thursday, slightly worsened with exertion and light headedness. Similar light headedness with syncope occurred Saturday morning. She went to the RN at his work place who told him his heart rate was "out of wack", slow and fast but he doesn't remember the exact pulse rate. He took nitroglycerin x 2 today which almost got rid of chest pain completely. He started taking his metoprolol prescribed prn on Thursday as well. In the ER he was diagnosed with COVID-19 incidental infection and sent here. Developed partial SBO here.   Subjective:   Patient in bed, appears comfortable, denies any headache, no fever, no chest pain or pressure, no shortness of breath , +ve genralized abdominal pain. No focal weakness.    Assessment  & Plan :     1. Incidental COVID-19 infection during the ongoing 2020 Covid 19 Pandemic - clear chest x-ray, inflammatory markers unremarkable, no cough or shortness of breath afebrile do not think this is of any significance.   COVID-19 Labs   Recent Labs    06/09/19 0312 06/10/19 0130  DDIMER 7.71* 7.18*  CRP 1.6* 1.4*    Lab Results  Component Value Date   SARSCOV2NAA POSITIVE (A) 06/05/2019     Hepatic Function Latest Ref Rng & Units 06/10/2019 06/09/2019 06/08/2019  Total Protein 6.5 - 8.1 g/dL 7.0 6.7 6.7  Albumin 3.5 -  5.0 g/dL 3.2(L) 3.2(L) 3.3(L)  AST 15 - 41 U/L 65(H) 97(H) 51(H)  ALT 0 - 44 U/L 84(H) 98(H) 59(H)  Alk Phosphatase 38 - 126 U/L 58 48 43  Total Bilirubin 0.3 - 1.2 mg/dL 1.3(H) 1.1 1.3(H)  Bilirubin, Direct 0.0 - 0.3 mg/dL - - -        Component Value Date/Time   BNP 18.8 06/10/2019 0130      2.  Dehydration and AKI.  Continue IV fluids, avoid nephrotoxins and monitor renal function.  3.  Syncope.  Multiple episodes.  Was clearly dehydrated and orthostatic, TED stockings applied, he has been aggressively hydrated with IV fluids which will be continued due to ongoing diarrhea, offending medications are still held, stable on telemetry with nonacute echo.  Await PT input, patient may remain orthostatic if it becomes a chronic problem despite taking interventions, he and his wife both were given instructions to minimize the chances of fall with it.  May require a walker upon discharge.  4.  Paroxysmal atrial fibrillation had vas 2 score of at least 3.  Currently on Eliquis >> Hep Gtt, outpatient cardiology follow-up echocardiogram stable. He has remained stable on Tele x 3 days, DC tele, monitor clinically.  5.  Dyslipidemia on statin and ezetimibe.  6.  Chest pressure.  Resolved.  He is already on combination of aspirin, Eliquis, statin.  Currently chest pain-free.  Outpatient cardiology follow-up with his primary cardiologist Dr. Skeet Latch once he is discharged.  May require outpatient stress test.  Troponin negative this admission.  7.  Mild COVID-19 related transaminitis.  Symptom-free, mild elevation in liver enzymes continue to monitor clinically.  Post discharge repeat CMP in 7 to 10  days by PCP.  8.  COVID 19 induced diarrhea/gastroenteritis. Resolved, had mild Ileius due to Imodium, stopped Imodium 06/09/19, had 3  BMs on 8/22 and was placed on clears >> soft, was passing flatus but overnight worse pain and distension, NG ordered by the night team, faqiled attempts x 4, Abd is distended and some pain, flatus x 1 this am, KUB pending this am, DW CCS, transfer to [facility] , NG via Flouro, CCS to see, likely will resolve with NG and B.rest. DW Radiologist Dr Rosana Hoes as well  Attempted to place NG unfortunately lack of numbing medicine ( benzocaine) and air plunger syringe precluded to safe placement and confirmation, will transfer to [facility] for Flouro guided placement.  DW Radilogist Dr Thad Ranger again at [place]  Family Communication  :  Wife 06/06/19 and again in detail on 06/08/2019  Code Status :  Full   Diet :   Diet Order            Diet NPO time specified Except for: Sips with Meds  Diet effective now               Disposition Plan  :  [facility]  Consults  :  None  Procedures  :    TTE -   1. The left ventricle has normal systolic function, with an ejection fraction of 60-65%. The cavity size was normal. There is mildly increased left ventricular wall thickness. Left ventricular diastolic Doppler parameters are consistent with impaired  relaxation. Indeterminate filling pressures The E/e' is 8-15. No evidence of left ventricular regional wall motion abnormalities.  2. The right ventricle has normal systolc function. The cavity was normal. There is no increase in right ventricular wall thickness.  3. The mitral  valve is grossly normal.  4. The tricuspid valve was grossly normal.  5. The aortic valve is tricuspid Mild sclerosis of the aortic valve. No stenosis of the aortic valve.   PUD Prophylaxis : PPI  DVT Prophylaxis  :  Eliquis/ Hep GTT  Lab Results  Component Value Date   PLT 166 06/10/2019    Inpatient Medications  Scheduled Meds: .  aspirin EC  81 mg Oral QHS  . ezetimibe  10 mg Oral Daily  . finasteride  5 mg Oral Daily  . gabapentin  300 mg Oral Daily   And  . gabapentin  600 mg Oral QHS  . pantoprazole  40 mg Oral QAC breakfast  . rosuvastatin  40 mg Oral Daily  . sodium chloride flush  3 mL Intravenous Once   Continuous Infusions: . lactated ringers 100 mL/hr at 06/11/19 0406   PRN Meds:.acetaminophen, chlorpheniramine-HYDROcodone, guaiFENesin-dextromethorphan, nitroGLYCERIN, [DISCONTINUED] ondansetron **OR** ondansetron (ZOFRAN) IV, simethicone, tiZANidine  Antibiotics  :    Anti-infectives (From admission, onward)   None       Time Spent in minutes  30   Lala Lund M.D on 06/11/2019 at 8:09 AM  To page go to [email or web address] - password [email or web address]  [facility] -  Office  [phone]   See all Orders from today for further details    Objective:   Vitals:   06/10/19 2000 06/10/19 2200 06/11/19 0408 06/11/19 0726  BP: 135/77  (!) 148/86 (!) 149/97  Pulse: 79 79 75 87  Resp:    20  Temp: 98.6 F (37 C)  98.2 F (36.8 C) 98.5 F (36.9 C)  TempSrc: Oral  Oral Oral  SpO2: 94% 94% 94% 95%  Weight:      Height:        Wt Readings from Last 3 Encounters:  06/06/19 88 kg  05/09/19 95.3 kg  12/22/18 93 kg     Intake/Output Summary (Last 24 hours) at 06/11/2019 0809 Last data filed at 06/11/2019 0600 Gross per 24 hour  Intake 964.93 ml  Output 300 ml  Net 664.93 ml     Physical Exam  Awake Alert, Oriented X 3, No new F.N deficits, Normal affect [place].AT,PERRAL Supple Neck,No JVD, No cervical lymphadenopathy appriciated.  Symmetrical Chest wall movement, Good air movement bilaterally, CTAB RRR,No Gallops, Rubs or new Murmurs, No Parasternal Heave +ve but hypoactive B.Sounds, Abd is distended and tympanic, No organomegaly appriciated, No rebound - guarding or rigidity. No Cyanosis, Clubbing or edema, No new Rash or bruise     Data Review:    CBC Recent Labs  Lab 06/06/19  0610 06/07/19 0212 06/08/19 0420 06/09/19 0312 06/10/19 0130  WBC 3.4* 4.4 4.6 5.5 5.2  HGB 13.3 14.7 14.2 14.6 13.6  HCT 39.3 43.5 42.2 43.3 40.1  PLT 118* 122* 142* 154 166  MCV 89.3 88.6 88.7 89.8 89.3  MCH 30.2 29.9 29.8 30.3 30.3  MCHC 33.8 33.8 33.6 33.7 33.9  RDW 11.9 12.0 12.1 12.1 11.9  LYMPHSABS 0.8 0.6* 0.9 0.9 0.7  MONOABS 0.4 0.3 0.4 0.4 0.4  EOSABS 0.0 0.0 0.1 0.1 0.1  BASOSABS 0.0 0.0 0.0 0.0 0.0    Chemistries  Recent Labs  Lab 06/05/19 1815 06/06/19 0610 06/07/19 0212 06/08/19 0420 06/09/19 0312 06/10/19 0130 06/11/19 0340  NA  --  137 138 137 139 140 139  K  --  3.3* 3.7 3.7 4.0 4.0 4.0  CL  --  97* 102 103 105 105 105  CO2  --  _0 GLUCOSE  --  99 101* 87 85 104* 86  BUN  --  _1 CREATININE  --  1.66* 1.32* 1.26* 1.13 1.01 1.01  CALCIUM  --  8.5* 8.8* 8.4* 8.8* 8.8* 8.6*  MG 2.0  --  2.0 1.9 2.1 2.1  --   AST  --  49* 72* 51* 97* 65*  --   ALT  --  45* 67* 59* 98* 84*  --   ALKPHOS  --  46 44 43 48 58  --   BILITOT  --  1.2 1.3* 1.3* 1.1 1.3*  --    ------------------------------------------------------------------------------------------------------------------ No results for input(s): CHOL, HDL, LDLCALC, TRIG, CHOLHDL, LDLDIRECT in the last 72 hours.  Lab Results  Component Value Date   HGBA1C 6.2 04/01/2018   ------------------------------------------------------------------------------------------------------------------ No results for input(s): TSH, T4TOTAL, T3FREE, THYROIDAB in the last 72 hours.  Invalid input(s): FREET3  Cardiac Enzymes No results for input(s): CKMB, TROPONINI, MYOGLOBIN in the last 168 hours.  Invalid input(s): CK ------------------------------------------------------------------------------------------------------------------    Component Value Date/Time   BNP 18.8 06/10/2019 0130    Micro Results Recent Results (from the past 240 hour(s))  SARS Coronavirus 2 [facility]  order, Performed in [facility] hospital lab) Nasopharyngeal Nasopharyngeal Swab     Status: Abnormal   Collection Time: 06/05/19  8:25 PM   Specimen: Nasopharyngeal Swab  Result Value Ref Range Status   SARS Coronavirus 2 POSITIVE (A) NEGATIVE Final    Comment: RESULT CALLED TO, READ BACK BY AND VERIFIED WITH: Chalmers Cater RN 06/05/19 2134 JDW (NOTE) If result is NEGATIVE SARS-CoV-2 target nucleic acids are NOT DETECTED. The SARS-CoV-2 RNA is generally detectable in upper and lower  respiratory specimens during the acute phase of infection. The lowest  concentration of SARS-CoV-2 viral copies this assay can detect is 250  copies / mL. A negative result does not preclude SARS-CoV-2 infection  and should not be used as the sole basis for treatment or other  patient management decisions.  A negative result may occur with  improper specimen collection / handling, submission of specimen other  than nasopharyngeal swab, presence of viral mutation(s) within the  areas targeted by this assay, and inadequate number of viral copies  (<250 copies / mL). A negative result must be combined with clinical  observations, patient history, and epidemiological information. If result is POSITIVE SARS-CoV-2 target nucleic acids are DETECTED. The SARS -CoV-2 RNA is generally detectable in upper and lower  respiratory specimens during the acute phase of infection.  Positive  results are indicative of active infection with SARS-CoV-2.  Clinical  correlation with patient history and other diagnostic information is  necessary to determine patient infection status.  Positive results do  not rule out bacterial infection or co-infection with other viruses. If result is PRESUMPTIVE POSTIVE SARS-CoV-2 nucleic acids MAY BE PRESENT.   A presumptive positive result was obtained on the submitted specimen  and confirmed on repeat testing.  While 2019 novel coronavirus  (SARS-CoV-2) nucleic acids may be present in the  submitted sample  additional confirmatory testing may be necessary for epidemiological  and / or clinical management purposes  to differentiate between  SARS-CoV-2 and other Sarbecovirus currently known to infect humans.  If clinically indicated additional testing with an alternate test  methodology [phone]) is advise d. The SARS-CoV-2 RNA is generally  detectable in upper and lower respiratory specimens during the acute  phase of infection. The  expected result is Negative. Fact Sheet for Patients:  StrictlyIdeas.no Fact Sheet for Healthcare Providers: BankingDealers.co.za This test is not yet approved or cleared by the [place] FDA and has been authorized for detection and/or diagnosis of SARS-CoV-2 by FDA under an Emergency Use Authorization (EUA).  This EUA will remain in effect (meaning this test can be used) for the duration of the COVID-19 declaration under Section 564(b)(1) of the Act, 21 U.S.C. section 360bbb-3(b)(1), unless the authorization is terminated or revoked sooner. Performed at [facility], [place] [address] [place], [place] [address]     Radiology Reports Dg Chest 2 View  Result Date: 06/05/2019 CLINICAL DATA:  Syncope x2 over the past week. History of atrial fibrillation. EXAM: CHEST - 2 VIEW COMPARISON:  Single-view of the chest 05/30/2018. FINDINGS: Lungs clear. Heart size normal. Aortic atherosclerosis noted. No pneumothorax or pleural fluid. No acute or focal bony abnormality. IMPRESSION: No acute disease. Atherosclerosis. Electronically Signed   By: Inge Rise M.D.   On: 06/05/2019 15:50   Dg Abd Portable 1v  Result Date: 06/09/2019 CLINICAL DATA:  Encounter for abdominal pian and tightness that began yesterday night EXAM: PORTABLE ABDOMEN - 1 VIEW COMPARISON:  CT 12/13/2018 FINDINGS: There is disproportionate dilatation of small bowel relative to large bowel loops. No evidence for free  intraperitoneal air on these supine views performed. IMPRESSION: Early or partial small bowel obstruction. Electronically Signed   By: Nolon Nations M.D.   On: 06/09/2019 17:23

## 2019-06-12 ENCOUNTER — Inpatient Hospital Stay (HOSPITAL_COMMUNITY): Payer: Medicare Other

## 2019-06-12 ENCOUNTER — Ambulatory Visit: Payer: Medicare Other | Admitting: Neurology

## 2019-06-12 LAB — BASIC METABOLIC PANEL
Anion gap: 14 (ref 5–15)
BUN: 13 mg/dL (ref 8–23)
CO2: 23 mmol/L (ref 22–32)
Calcium: 8.8 mg/dL — ABNORMAL LOW (ref 8.9–10.3)
Chloride: 102 mmol/L (ref 98–111)
Creatinine, Ser: 1.06 mg/dL (ref 0.61–1.24)
GFR calc Af Amer: >60 mL/min (ref >60)
GFR calc non Af Amer: >60 mL/min (ref >60)
Glucose, Bld: 89 mg/dL (ref 70–99)
Potassium: 3.8 mmol/L (ref 3.5–5.1)
Sodium: 139 mmol/L (ref 135–145)

## 2019-06-12 LAB — APTT
aPTT: 39 seconds — ABNORMAL HIGH (ref 24–36)
aPTT: 44 seconds — ABNORMAL HIGH (ref 24–36)

## 2019-06-12 LAB — CBC
HCT: 39.8 % (ref 39.0–52.0)
Hemoglobin: 13.5 g/dL (ref 13.0–17.0)
MCH: 30.5 pg (ref 26.0–34.0)
MCHC: 33.9 g/dL (ref 30.0–36.0)
MCV: 90 fL (ref 80.0–100.0)
Platelets: 225 10*3/uL (ref 150–400)
RBC: 4.42 MIL/uL (ref 4.22–5.81)
RDW: 11.9 % (ref 11.5–15.5)
WBC: 2 10*3/uL — ABNORMAL LOW (ref 4.0–10.5)
nRBC: 0 % (ref 0.0–0.2)

## 2019-06-12 LAB — GLUCOSE, CAPILLARY: Glucose-Capillary: 85 mg/dL (ref 70–99)

## 2019-06-12 LAB — HEPARIN LEVEL (UNFRACTIONATED): Heparin Unfractionated: 1.48 IU/mL — ABNORMAL HIGH (ref 0.30–0.70)

## 2019-06-12 LAB — MAGNESIUM: Magnesium: 2 mg/dL (ref 1.7–2.4)

## 2019-06-12 MED ORDER — HEPARIN (PORCINE) 25000 UT/250ML-% IV SOLN
1650.0000 [IU]/h | INTRAVENOUS | Status: DC
  Administered 2019-06-12 – 2019-06-15 (×3): 1500 [IU]/h via INTRAVENOUS
  Administered 2019-06-16: 11:00:00 1650 [IU]/h via INTRAVENOUS
  Filled 2019-06-12 (×5): qty 250

## 2019-06-12 MED ORDER — KCL-LACTATED RINGERS-D5W 20 MEQ/L IV SOLN
INTRAVENOUS | Status: DC
  Administered 2019-06-12: 12:00:00 via INTRAVENOUS
  Filled 2019-06-12 (×2): qty 1000

## 2019-06-12 MED ORDER — HEPARIN BOLUS VIA INFUSION
1500.0000 [IU] | Freq: Once | INTRAVENOUS | Status: AC
  Administered 2019-06-12: 1500 [IU] via INTRAVENOUS
  Filled 2019-06-12: qty 1500

## 2019-06-12 MED ORDER — KETOROLAC TROMETHAMINE 15 MG/ML IJ SOLN
7.5000 mg | Freq: Three times a day (TID) | INTRAMUSCULAR | Status: AC
  Administered 2019-06-12 – 2019-06-17 (×15): 7.5 mg via INTRAVENOUS
  Filled 2019-06-12 (×16): qty 1

## 2019-06-12 MED ORDER — HEPARIN (PORCINE) 25000 UT/250ML-% IV SOLN
1300.0000 [IU]/h | INTRAVENOUS | Status: DC
  Administered 2019-06-12: 1300 [IU]/h via INTRAVENOUS

## 2019-06-12 MED ORDER — DIATRIZOATE MEGLUMINE & SODIUM 66-10 % PO SOLN
90.0000 mL | Freq: Once | ORAL | Status: AC
  Administered 2019-06-12: 90 mL via NASOGASTRIC
  Filled 2019-06-12: qty 90

## 2019-06-12 NOTE — Progress Notes (Signed)
Patient re-admitted from main campus after NGT insertion. VSS. MD notified of return. No new orders at this time. Patient states pain is well controlled. NGT hooked up to LIS. Patient remains on hep gtt and MIVF. Patient oriented to new room. Cell phone and call light in reach. Patient belongings include cell phone, charger, and clothing.

## 2019-06-12 NOTE — Progress Notes (Addendum)
PROGRESS NOTE                                                                                                                                                                                                             Patient Demographics:    [name], is a 68 y.o. male, DOB - [birth year], CHE:[id number]  Outpatient Primary MD for the patient is [name], FNP    LOS - 6  Admit date - 06/05/2019    Chief Complaint  Patient presents with   Chest Pain       Brief Narrative  [name] is a 68 y.o. male with medical history significant of CAD, PAF, HTN, OSA.  Patient has been feeling well until Thursday when he was sitting on golf cart and had sudden onset light headedness, he walked a couple of steps and passed out. Woke up immediately and noticed chest pressure described as "brick sitting on my chest". This chest pressure has been constant since Thursday, slightly worsened with exertion and light headedness. Similar light headedness with syncope occurred Saturday morning. She went to the RN at his work place who told him his heart rate was "out of wack", slow and fast but he doesn't remember the exact pulse rate. He took nitroglycerin x 2 today which almost got rid of chest pain completely. He started taking his metoprolol prescribed prn on Thursday as well. In the ER he was diagnosed with COVID-19 incidental infection and sent here. Developed partial SBO here.   Subjective:   Seen in bed, no chest pain or shortness of breath, has NG tube, passing some flatus, no bowel movements today, no abdominal pain no nausea currently.   Assessment  & Plan :     1. Incidental COVID-19 infection during the ongoing 2020 Covid 19 Pandemic - clear chest x-ray, inflammatory markers unremarkable, no cough or shortness of breath afebrile do not think this is of any significance.   COVID-19 Labs  Recent Labs     06/10/19 0130  DDIMER 7.18*  CRP 1.4*    Lab Results  Component Value Date   SARSCOV2NAA POSITIVE (A) 06/05/2019     Hepatic Function Latest Ref Rng & Units 06/10/2019 06/09/2019 06/08/2019  Total Protein 6.5 - 8.1 g/dL 7.0 6.7 6.7  Albumin 3.5 - 5.0 g/dL 3.2(L) 3.2(L) 3.3(L)  AST 15 - 41 U/L 65(H) 97(H) 51(H)  ALT 0 - 44 U/L 84(H) 98(H) 59(H)  Alk Phosphatase 38 - 126 U/L 58 48 43  Total Bilirubin 0.3 - 1.2 mg/dL 1.3(H) 1.1 1.3(H)  Bilirubin, Direct 0.0 - 0.3 mg/dL - - -        Component Value Date/Time   BNP 18.8 06/10/2019 0130      2.  Dehydration and AKI.  Continue IV fluids, avoid nephrotoxins and monitor renal function.  3.  Syncope.  Multiple episodes.  Was clearly dehydrated and orthostatic, TED stockings applied, he has been aggressively hydrated with IV fluids which will be continued due to ongoing diarrhea, offending medications are still held, stable on telemetry with nonacute echo.  Await PT input, patient may remain orthostatic if it becomes a chronic problem despite taking interventions, he and his wife both were given instructions to minimize the chances of fall with it.  May require a walker upon discharge.  4.  Paroxysmal atrial fibrillation had vas 2 score of at least 3.  Currently on Eliquis >> Hep Gtt, outpatient cardiology follow-up echocardiogram stable. He has remained stable on Tele x 3 days, DC tele, monitor clinically.  5.  Dyslipidemia on statin and ezetimibe.  6.  Chest pressure.  Resolved.  He is already on combination of aspirin, Eliquis, statin.  Currently chest pain-free.  Outpatient cardiology follow-up with his primary cardiologist Dr. Skeet Latch once he is discharged.  May require outpatient stress test.  Troponin negative this admission.  7.  Mild COVID-19 related transaminitis.  Symptom-free, mild elevation in liver enzymes continue to monitor clinically.  Post discharge repeat CMP in 7 to 10 days by PCP.  8.  COVID 19 induced  diarrhea/gastroenteritis. Resolved, had mild Ileius due to Imodium, stopped Imodium 06/09/19, had 3  BMs on 8/22 and was placed on clears >> soft, was passing flatus and had 2 more bowel movements on 06/10/2019, however late at night of 06/10/2019 abdomen got more distended.  KUB was ordered which showed SBO, had NG tube placed, surgery was consulted.  Patient currently has NG, KUB shows continued ileus versus SBO.  CT did not show a specific transition point.  Still has distended abdomen which is tympanic but passing some flatus.  Continue gentle hydration bowel rest.  Further management per general surgery.   Condition - Fair  Family Communication  :  Wife 06/06/19 and again in detail on 06/08/2019, daughter Crystal 06/12/19  Code Status :  Full   Diet :   Diet Order            Diet NPO time specified Except for: Sips with Meds  Diet effective now               Disposition Plan  :  [facility]  Consults  :  None  Procedures  :    TTE -   1. The left ventricle has normal systolic function, with an ejection fraction of 60-65%. The cavity size was normal. There is mildly increased left ventricular wall thickness. Left ventricular diastolic Doppler parameters are consistent with impaired  relaxation. Indeterminate filling pressures The E/e' is 8-15. No evidence of left ventricular regional wall motion abnormalities.  2. The right ventricle has normal systolc function. The cavity was normal. There is no increase in right ventricular wall thickness.  3. The mitral valve is grossly normal.  4. The tricuspid valve was grossly normal.  5. The aortic valve is tricuspid Mild sclerosis of the  aortic valve. No stenosis of the aortic valve.   PUD Prophylaxis : PPI  DVT Prophylaxis  :  Eliquis/ Hep GTT  Lab Results  Component Value Date   PLT 225 06/12/2019    Inpatient Medications  Scheduled Meds:  aspirin EC  81 mg Oral QHS   Chlorhexidine Gluconate Cloth  6 each Topical Daily    finasteride  5 mg Oral Daily   pantoprazole (PROTONIX) IV  40 mg Intravenous Q24H   rosuvastatin  40 mg Oral Daily   sodium chloride flush  3 mL Intravenous Once   Continuous Infusions:  heparin 1,100 Units/hr (06/11/19 1600)   PRN Meds:.acetaminophen, guaiFENesin-dextromethorphan, nitroGLYCERIN, [DISCONTINUED] ondansetron **OR** ondansetron (ZOFRAN) IV, phenol, tiZANidine  Antibiotics  :    Anti-infectives (From admission, onward)   None       Time Spent in minutes  30   Lala Lund M.D on 06/12/2019 at 10:46 AM  To page go to [email or web address] - password [facility]  [facility] -  Office  [phone]   See all Orders from today for further details    Objective:   Vitals:   06/12/19 0531 06/12/19 0600 06/12/19 0700 06/12/19 0933  BP: (!) 142/84     Pulse: 81     Resp:      Temp: 98.4 F (36.9 C)     TempSrc: Oral     SpO2: 94% 94% 93% 93%  Weight: 89.2 kg     Height: _0  (1.854 m)       Wt Readings from Last 3 Encounters:  06/12/19 89.2 kg  05/09/19 95.3 kg  12/22/18 93 kg     Intake/Output Summary (Last 24 hours) at 06/12/2019 1046 Last data filed at 06/12/2019 0600 Gross per 24 hour  Intake 1261.01 ml  Output 1600 ml  Net -338.99 ml     Physical Exam  Awake Alert, Oriented X 3, No new F.N deficits, Normal affect [place].AT,PERRAL, NG tube in place Supple Neck,No JVD, No cervical lymphadenopathy appriciated.  Symmetrical Chest wall movement, Good air movement bilaterally, CTAB RRR,No Gallops, Rubs or new Murmurs, No Parasternal Heave +ve B.Sounds, Abd is mildly distended and somewhat tympanic but patient says this is his routine abdomen size, No organomegaly appriciated, No rebound - guarding or rigidity. No Cyanosis, Clubbing or edema, No new Rash or bruise    Data Review:    CBC Recent Labs  Lab 06/06/19 0610 06/07/19 0212 06/08/19 0420 06/09/19 0312 06/10/19 0130 06/12/19 0605  WBC 3.4* 4.4 4.6 5.5 5.2 2.0*  HGB 13.3 14.7 14.2  14.6 13.6 13.5  HCT 39.3 43.5 42.2 43.3 40.1 39.8  PLT 118* 122* 142* 154 166 225  MCV 89.3 88.6 88.7 89.8 89.3 90.0  MCH 30.2 29.9 29.8 30.3 30.3 30.5  MCHC 33.8 33.8 33.6 33.7 33.9 33.9  RDW 11.9 12.0 12.1 12.1 11.9 11.9  LYMPHSABS 0.8 0.6* 0.9 0.9 0.7  --   MONOABS 0.4 0.3 0.4 0.4 0.4  --   EOSABS 0.0 0.0 0.1 0.1 0.1  --   BASOSABS 0.0 0.0 0.0 0.0 0.0  --     Chemistries  Recent Labs  Lab 06/06/19 0610 06/07/19 0212 06/08/19 0420 06/09/19 0312 06/10/19 0130 06/11/19 0340 06/12/19 0605  NA 137 138 137 139 140 139 139  K 3.3* 3.7 3.7 4.0 4.0 4.0 3.8  CL 97* 102 103 105 105 105 102  CO2 _1 GLUCOSE 99 101* 87 85 104* 86 89  BUN 23  _0 CREATININE 1.66* 1.32* 1.26* 1.13 1.01 1.01 1.06  CALCIUM 8.5* 8.8* 8.4* 8.8* 8.8* 8.6* 8.8*  MG  --  2.0 1.9 2.1 2.1  --  2.0  AST 49* 72* 51* 97* 65*  --   --   ALT 45* 67* 59* 98* 84*  --   --   ALKPHOS 46 44 43 48 58  --   --   BILITOT 1.2 1.3* 1.3* 1.1 1.3*  --   --    ------------------------------------------------------------------------------------------------------------------ No results for input(s): CHOL, HDL, LDLCALC, TRIG, CHOLHDL, LDLDIRECT in the last 72 hours.  Lab Results  Component Value Date   HGBA1C 6.2 04/01/2018   ------------------------------------------------------------------------------------------------------------------ No results for input(s): TSH, T4TOTAL, T3FREE, THYROIDAB in the last 72 hours.  Invalid input(s): FREET3  Cardiac Enzymes No results for input(s): CKMB, TROPONINI, MYOGLOBIN in the last 168 hours.  Invalid input(s): CK ------------------------------------------------------------------------------------------------------------------    Component Value Date/Time   BNP 18.8 06/10/2019 0130    Micro Results Recent Results (from the past 240 hour(s))  SARS Coronavirus 2 [facility] order, Performed in [facility] hospital lab) Nasopharyngeal  Nasopharyngeal Swab     Status: Abnormal   Collection Time: 06/05/19  8:25 PM   Specimen: Nasopharyngeal Swab  Result Value Ref Range Status   SARS Coronavirus 2 POSITIVE (A) NEGATIVE Final    Comment: RESULT CALLED TO, READ BACK BY AND VERIFIED WITH: Chalmers Cater RN 06/05/19 2134 JDW (NOTE) If result is NEGATIVE SARS-CoV-2 target nucleic acids are NOT DETECTED. The SARS-CoV-2 RNA is generally detectable in upper and lower  respiratory specimens during the acute phase of infection. The lowest  concentration of SARS-CoV-2 viral copies this assay can detect is 250  copies / mL. A negative result does not preclude SARS-CoV-2 infection  and should not be used as the sole basis for treatment or other  patient management decisions.  A negative result may occur with  improper specimen collection / handling, submission of specimen other  than nasopharyngeal swab, presence of viral mutation(s) within the  areas targeted by this assay, and inadequate number of viral copies  (<250 copies / mL). A negative result must be combined with clinical  observations, patient history, and epidemiological information. If result is POSITIVE SARS-CoV-2 target nucleic acids are DETECTED. The SARS -CoV-2 RNA is generally detectable in upper and lower  respiratory specimens during the acute phase of infection.  Positive  results are indicative of active infection with SARS-CoV-2.  Clinical  correlation with patient history and other diagnostic information is  necessary to determine patient infection status.  Positive results do  not rule out bacterial infection or co-infection with other viruses. If result is PRESUMPTIVE POSTIVE SARS-CoV-2 nucleic acids MAY BE PRESENT.   A presumptive positive result was obtained on the submitted specimen  and confirmed on repeat testing.  While 2019 novel coronavirus  (SARS-CoV-2) nucleic acids may be present in the submitted sample  additional confirmatory testing may be  necessary for epidemiological  and / or clinical management purposes  to differentiate between  SARS-CoV-2 and other Sarbecovirus currently known to infect humans.  If clinically indicated additional testing with an alternate test  methodology [id number]) is advise d. The SARS-CoV-2 RNA is generally  detectable in upper and lower respiratory specimens during the acute  phase of infection. The expected result is Negative. Fact Sheet for Patients:  StrictlyIdeas.no Fact Sheet for Healthcare Providers: BankingDealers.co.za This test is not yet approved or cleared by the  [place] Architectural technologist and has been authorized for detection and/or diagnosis of SARS-CoV-2 by FDA under an Print production planner (EUA).  This EUA will remain in effect (meaning this test can be used) for the duration of the COVID-19 declaration under Section 564(b)(1) of the Act, 21 U.S.C. section 360bbb-3(b)(1), unless the authorization is terminated or revoked sooner. Performed at [facility], [place] [address] [place], [place] [address]   MRSA PCR Screening     Status: None   Collection Time: 06/11/19  2:17 PM  Result Value Ref Range Status   MRSA by PCR NEGATIVE NEGATIVE Final    Comment:        The GeneXpert MRSA Assay (FDA approved for NASAL specimens only), is one component of a comprehensive MRSA colonization surveillance program. It is not intended to diagnose MRSA infection nor to guide or monitor treatment for MRSA infections. Performed at [facility], [place] [address], [place], [place] [address]     Radiology Reports Dg Chest 2 View  Result Date: 06/05/2019 CLINICAL DATA:  Syncope x2 over the past week. History of atrial fibrillation. EXAM: CHEST - 2 VIEW COMPARISON:  Single-view of the chest 05/30/2018. FINDINGS: Lungs clear. Heart size normal. Aortic atherosclerosis noted. No pneumothorax or pleural fluid. No acute or focal bony abnormality.  IMPRESSION: No acute disease. Atherosclerosis. Electronically Signed   By: Inge Rise M.D.   On: 06/05/2019 15:50   Ct Abdomen Pelvis W Contrast  Result Date: 06/11/2019 CLINICAL DATA:  Abdominal pain, gastroenteritis or colitis, COVID-19 positive EXAM: CT ABDOMEN AND PELVIS WITH CONTRAST TECHNIQUE: Multidetector CT imaging of the abdomen and pelvis was performed using the standard protocol following bolus administration of intravenous contrast. CONTRAST:  57m OMNIPAQUE IOHEXOL 300 MG/ML SOLN, additional oral enteric contrast COMPARISON:  12/13/2018 FINDINGS: Lower chest: Small right pleural effusion and associated atelectasis. Scattered ground-glass opacities primarily in the included left lower lung. Coronary artery calcifications. Hepatobiliary: No solid liver abnormality is seen. No gallstones, gallbladder wall thickening, or biliary dilatation. Pancreas: Unremarkable. No pancreatic ductal dilatation or surrounding inflammatory changes. Spleen: Normal in size without significant abnormality. Adrenals/Urinary Tract: Adrenal glands are unremarkable. Kidneys are normal, without renal calculi, solid lesion, or hydronephrosis. Bladder is unremarkable. Stomach/Bowel: Stomach is within normal limits. Appendix appears normal. The small bowel is diffusely distended to the terminal ileum, largest loops measuring 4.3 cm, the final loop of terminal ileum decompressed with fluid in the colon to the rectum. Sigmoid diverticulosis. Esophagogastric tube is positioned with tip in the duodenum. Vascular/Lymphatic: Aortic atherosclerosis. No enlarged abdominal or pelvic lymph nodes. Reproductive: No mass or other significant abnormality. Other: No abdominal wall hernia or abnormality. Small volume ascites. Musculoskeletal: No acute or significant osseous findings. IMPRESSION: 1. The small bowel is diffusely distended to the terminal ileum, largest loops measuring 4.3 cm, the final loop of terminal ileum decompressed  with fluid in the colon to the rectum. Findings favor ileus without definite findings to suggest obstructed transition point of the terminal ileum. Consider follow-up radiographs to observe for transit of oral contrast to the colon. 2.  Small volume ascites, likely reactive. 3. Esophagogastric tube is positioned with tip in the duodenum. Consider slight retraction for intragastric positioning. Electronically Signed   By: AEddie CandleM.D.   On: 06/11/2019 18:45   Dg Abd Portable 1v-small Bowel Protocol-position Verification  Result Date: 06/12/2019 CLINICAL DATA:  Small bowel obstruction EXAM: PORTABLE ABDOMEN - 1 VIEW COMPARISON:  June 11, 2019 FINDINGS: Nasogastric tube tip and  side port are in the proximal duodenum. There remain loops of dilated bowel consistent with either ileus or a degree of bowel obstruction. No free air. Mild stool present in colon IMPRESSION: Nasogastric tube tip and side port in proximal duodenum. Loops mildly dilated bowel remain without progression. No free air. Electronically Signed   By: Lowella Grip III M.D.   On: 06/12/2019 08:04   Dg Abd Portable 1v  Result Date: 06/11/2019 CLINICAL DATA:  Small bowel obstruction EXAM: PORTABLE ABDOMEN - 1 VIEW COMPARISON:  06/09/2019 FINDINGS: There is persistent significant dilatation of small bowel loops, disproportionate to the dilatation of large bowel loops and consistent with small bowel obstruction. The degree of bowel distention has increased since previous exam, a small polyps measuring up to 5.3 centimeters. No evidence for free intraperitoneal air. IMPRESSION: Persistent small bowel obstruction. Increased caliber of small bowel loops. Electronically Signed   By: Nolon Nations M.D.   On: 06/11/2019 08:52   Dg Abd Portable 1v  Result Date: 06/09/2019 CLINICAL DATA:  Encounter for abdominal pian and tightness that began yesterday night EXAM: PORTABLE ABDOMEN - 1 VIEW COMPARISON:  CT 12/13/2018 FINDINGS: There is  disproportionate dilatation of small bowel relative to large bowel loops. No evidence for free intraperitoneal air on these supine views performed. IMPRESSION: Early or partial small bowel obstruction. Electronically Signed   By: Nolon Nations M.D.   On: 06/09/2019 17:23   Dg Addison Bailey G Tube Plc W/fl W/rad  Result Date: 06/11/2019 CLINICAL DATA:  Small-bowel distention. Patient requires gastroenteric decompression. Multiple attempts to place NG tube on the floor were unsuccessful and radiology placement has been requested. EXAM: NASO G TUBE PLACEMENT WITH FL AND WITH RAD CONTRAST:  None. FLUOROSCOPY TIME:  Fluoroscopy Time:  0 minutes and 6 seconds. Radiation Exposure Index (if provided by the fluoroscopic device): 3.3 mGy Number of Acquired Spot Images: 1 COMPARISON:  None. FINDINGS: The patient's right nostril was anesthetized with viscous lidocaine. NG tube tip was coated with surgilube. NG tube was easily advanced through the right nostril into the stomach on the first attempt. NG tube tip position was confirmed with a spot fluoro image. IMPRESSION: NG tube is ready to use with tip position in the distal stomach. Electronically Signed   By: Misty Stanley M.D.   On: 06/11/2019 19:36

## 2019-06-12 NOTE — Progress Notes (Signed)
ANTICOAGULATION CONSULT NOTE - Follow Up Consult  Pharmacy Consult for Heparin Indication: atrial fibrillation  Allergies  Allergen Reactions  . Felodipine Swelling    Legs became swollen  . Niacin Other (See Comments)    Made patient feel flushed    Patient Measurements: Height: 6\' 1"  (185.4 cm) Weight: 196 lb 10.4 oz (89.2 kg) IBW/kg (Calculated) : 79.9 Heparin Dosing Weight: TBW  Vital Signs: Temp: 98.4 F (36.9 C) (08/24 0531) Temp Source: Oral (08/24 0531) BP: 142/84 (08/24 0531) Pulse Rate: 81 (08/24 0531)  Labs: Recent Labs    06/10/19 0130 06/11/19 0340 06/12/19 0605  HGB 13.6  --  13.5  HCT 40.1  --  39.8  PLT 166  --  225  APTT  --   --  39*  HEPARINUNFRC  --   --  1.48*  CREATININE 1.01 1.01 1.06    Estimated Creatinine Clearance: 77.5 mL/min (by C-G formula based on SCr of 1.06 mg/dL).   Medications:  Infusions:  . dextrose 5% lactated ringers with KCl 20 mEq/L 75 mL/hr at 06/12/19 1205  . heparin 1,100 Units/hr (06/11/19 1600)    Assessment: 68 YOF with Afib and CHADSVASc score 3 on apixaban at home. Pharmacy consulted to transition to IV heparin. Apixaban has been discontinued and the last inpatient apixaban dose was 8/22 at 2100.  Heparin was started on 8/23 at  0930.  Today, 06/12/2019: APTT 39 HL 1.48 (falsely elevated d/t recent apixaban) CBC:  Hgb and Plt remain stable, WNL. No bleeding or complications reported.   Goal of Therapy:  Heparin level 0.3-0.7 units/ml aPTT 66-102 seconds Monitor platelets by anticoagulation protocol: Yes   Plan:  Give heparin 1500 units bolus IV x 1 Increase to heparin IV infusion at 1300 units/hr APTT 6 hours after rate change Daily APTT, heparin level, and CBC Continue to monitor H&H and platelets   Gretta Arab PharmD, BCPS Clinical pharmacist phone 7am- 5pm: [phone] 06/12/2019 12:55 PM

## 2019-06-12 NOTE — Progress Notes (Signed)
Physical Therapy Cancelation     06/12/19 0915  PT Visit Information  Last PT Received On 06/12/19  Reason Eval/Treat Not Completed Medical issues which prohibited therapy    Per RN, she recently filled pt's belly with fluids for testing. She recommended try to return to see pt later today.    Barry Brunner, PT

## 2019-06-13 ENCOUNTER — Inpatient Hospital Stay (HOSPITAL_COMMUNITY): Payer: Medicare Other

## 2019-06-13 LAB — CBC WITH DIFFERENTIAL/PLATELET
Abs Immature Granulocytes: 0 10*3/uL (ref 0.00–0.07)
Band Neutrophils: 9 %
Basophils Absolute: 0 10*3/uL (ref 0.0–0.1)
Basophils Relative: 0 %
Eosinophils Absolute: 0.1 10*3/uL (ref 0.0–0.5)
Eosinophils Relative: 4 %
HCT: 39.1 % (ref 39.0–52.0)
Hemoglobin: 13.1 g/dL (ref 13.0–17.0)
Lymphocytes Relative: 30 %
Lymphs Abs: 0.6 10*3/uL — ABNORMAL LOW (ref 0.7–4.0)
MCH: 29.8 pg (ref 26.0–34.0)
MCHC: 33.5 g/dL (ref 30.0–36.0)
MCV: 89.1 fL (ref 80.0–100.0)
Monocytes Absolute: 0.3 10*3/uL (ref 0.1–1.0)
Monocytes Relative: 15 %
Neutro Abs: 1 10*3/uL — ABNORMAL LOW (ref 1.7–7.7)
Neutrophils Relative %: 42 %
Platelets: 272 10*3/uL (ref 150–400)
RBC: 4.39 MIL/uL (ref 4.22–5.81)
RDW: 11.9 % (ref 11.5–15.5)
WBC: 1.9 10*3/uL — ABNORMAL LOW (ref 4.0–10.5)
nRBC: 0 % (ref 0.0–0.2)

## 2019-06-13 LAB — COMPREHENSIVE METABOLIC PANEL
ALT: 55 U/L — ABNORMAL HIGH (ref 0–44)
AST: 37 U/L (ref 15–41)
Albumin: 3.2 g/dL — ABNORMAL LOW (ref 3.5–5.0)
Alkaline Phosphatase: 89 U/L (ref 38–126)
Anion gap: 8 (ref 5–15)
BUN: 19 mg/dL (ref 8–23)
CO2: 29 mmol/L (ref 22–32)
Calcium: 8.7 mg/dL — ABNORMAL LOW (ref 8.9–10.3)
Chloride: 102 mmol/L (ref 98–111)
Creatinine, Ser: 1.11 mg/dL (ref 0.61–1.24)
GFR calc Af Amer: >60 mL/min (ref >60)
GFR calc non Af Amer: >60 mL/min (ref >60)
Glucose, Bld: 124 mg/dL — ABNORMAL HIGH (ref 70–99)
Potassium: 3.8 mmol/L (ref 3.5–5.1)
Sodium: 139 mmol/L (ref 135–145)
Total Bilirubin: 1.3 mg/dL — ABNORMAL HIGH (ref 0.3–1.2)
Total Protein: 6.9 g/dL (ref 6.5–8.1)

## 2019-06-13 LAB — BRAIN NATRIURETIC PEPTIDE: B Natriuretic Peptide: 28.6 pg/mL (ref 0.0–100.0)

## 2019-06-13 LAB — MAGNESIUM: Magnesium: 2.4 mg/dL (ref 1.7–2.4)

## 2019-06-13 LAB — APTT
aPTT: 75 seconds — ABNORMAL HIGH (ref 24–36)
aPTT: 62 seconds — ABNORMAL HIGH (ref 24–36)

## 2019-06-13 LAB — PATHOLOGIST SMEAR REVIEW

## 2019-06-13 LAB — HEPARIN LEVEL (UNFRACTIONATED): Heparin Unfractionated: 1.04 IU/mL — ABNORMAL HIGH (ref 0.30–0.70)

## 2019-06-13 MED ORDER — KCL-LACTATED RINGERS-D5W 20 MEQ/L IV SOLN
INTRAVENOUS | Status: AC
  Administered 2019-06-13 (×2): via INTRAVENOUS
  Filled 2019-06-13 (×3): qty 1000

## 2019-06-13 NOTE — Progress Notes (Addendum)
PROGRESS NOTE                                                                                                                                                                                                             Patient Demographics:    [name], is a 68 y.o. male, DOB - [birth year], ZXY:[id number]  Outpatient Primary MD for the patient is [name], FNP    LOS - 7  Admit date - 06/05/2019    Chief Complaint  Patient presents with   Chest Pain       Brief Narrative  [name] is a 68 y.o. male with medical history significant of CAD, PAF, HTN, OSA.  Patient has been feeling well until Thursday when he was sitting on golf cart and had sudden onset light headedness, he walked a couple of steps and passed out. Woke up immediately and noticed chest pressure described as "brick sitting on my chest". This chest pressure has been constant since Thursday, slightly worsened with exertion and light headedness. Similar light headedness with syncope occurred Saturday morning. She went to the RN at his work place who told him his heart rate was "out of wack", slow and fast but he doesn't remember the exact pulse rate. He took nitroglycerin x 2 today which almost got rid of chest pain completely. He started taking his metoprolol prescribed prn on Thursday as well. In the ER he was diagnosed with COVID-19 incidental infection and sent here. Developed partial SBO here.   Subjective:   Patient sitting in recliner denies any chest pain or shortness of breath, no abdominal pain or nausea, passed flatus twice today, NG in place.   Assessment  & Plan :     1. Incidental COVID-19 infection during the ongoing 2020 Covid 19 Pandemic - clear chest x-ray, inflammatory markers unremarkable, no cough or shortness of breath afebrile do not think this is of any significance.   COVID-19 Labs  No results for input(s): DDIMER,  FERRITIN, LDH, CRP in the last 72 hours.  Lab Results  Component Value Date   SARSCOV2NAA POSITIVE (A) 06/05/2019     Hepatic Function Latest Ref Rng & Units 06/13/2019 06/10/2019 06/09/2019  Total Protein 6.5 - 8.1 g/dL 6.9 7.0 6.7  Albumin 3.5 - 5.0 g/dL 3.2(L) 3.2(L) 3.2(L)  AST 15 - 41  U/L 37 65(H) 97(H)  ALT 0 - 44 U/L 55(H) 84(H) 98(H)  Alk Phosphatase 38 - 126 U/L 89 58 48  Total Bilirubin 0.3 - 1.2 mg/dL 1.3(H) 1.3(H) 1.1  Bilirubin, Direct 0.0 - 0.3 mg/dL - - -        Component Value Date/Time   BNP 28.6 06/13/2019 0510      2.  Dehydration and AKI.  Continue IV fluids, avoid nephrotoxins and monitor renal function.  3.  Syncope.  Multiple episodes.  Was clearly dehydrated and orthostatic, TED stockings applied, he has been aggressively hydrated with IV fluids which will be continued due to ongoing diarrhea, offending medications are still held, stable on telemetry with nonacute echo.  Await PT input, patient may remain orthostatic if it becomes a chronic problem despite taking interventions, he and his wife both were given instructions to minimize the chances of fall with it.  May require a walker upon discharge.  4.  Paroxysmal atrial fibrillation had vas 2 score of at least 3.  Currently on Eliquis >> Hep Gtt, outpatient cardiology follow-up echocardiogram stable. He has remained stable on Tele x 3 days, DC tele, monitor clinically.  5.  Dyslipidemia on statin and ezetimibe.  6.  Chest pressure.  Resolved.  He is already on combination of aspirin, Eliquis, statin.  Currently chest pain-free.  Outpatient cardiology follow-up with his primary cardiologist Dr. Skeet Latch once he is discharged.  May require outpatient stress test.  Troponin negative this admission.  7.  Mild COVID-19 related transaminitis.  Symptom-free, mild elevation in liver enzymes continue to monitor clinically.  Post discharge repeat CMP in 7 to 10 days by PCP.  8.  COVID 19 induced  diarrhea/gastroenteritis. Resolved, had mild Ileius due to Imodium, stopped Imodium 06/09/19, had 3  BMs on 8/22 and was placed on clears >> soft, was passing flatus and had 2 more bowel movements on 06/10/2019, however late at night of 06/10/2019 abdomen got more distended.  KUB was ordered which showed SBO, had NG tube placed, surgery was consulted.  Patient currently has NG, KUB shows continued ileus versus SBO.  CT did not show a specific transition point.  Still has distended abdomen which is tympanic but passing some flatus.    Continue gentle hydration bowel rest.  Further management per general surgery, case discussed with Dr. Marlou Starks on 06/13/2019 he will discuss this with Dr. Kieth Brightly who originally saw the patient at [facility] and cleared him for transfer to [facility] and Dr Marlou Starks will update  the plan soon.  For now continue bowel rest and NG per Dr. Marlou Starks. Note clarified with patient x 2 , no constant pain, mild intermittent nausea and discomfort lasting few mins.   Condition - Fair  Family Communication  :  Wife 06/06/19 and again in detail on 06/08/2019, daughter Crystal 06/12/19  Code Status :  Full   Diet :   Diet Order            Diet NPO time specified Except for: Sips with Meds, Ice Chips  Diet effective now               Disposition Plan  :  [facility]  Consults  :  [facility]  Procedures  :    TTE -   1. The left ventricle has normal systolic function, with an ejection fraction of 60-65%. The cavity size was normal. There is mildly increased left ventricular wall thickness. Left ventricular diastolic Doppler parameters are consistent with impaired  relaxation. Indeterminate filling pressures The E/e' is 8-15. No evidence of left ventricular regional wall motion abnormalities.  2. The right ventricle has normal systolc function. The cavity was normal. There is no increase in right ventricular wall thickness.  3. The mitral valve is grossly normal.  4. The tricuspid valve was grossly  normal.  5. The aortic valve is tricuspid Mild sclerosis of the aortic valve. No stenosis of the aortic valve.   PUD Prophylaxis : PPI  DVT Prophylaxis  :  Eliquis/ Hep GTT  Lab Results  Component Value Date   PLT 272 06/13/2019    Inpatient Medications  Scheduled Meds:  aspirin EC  81 mg Oral QHS   Chlorhexidine Gluconate Cloth  6 each Topical Daily   finasteride  5 mg Oral Daily   ketorolac  7.5 mg Intravenous Q8H   pantoprazole (PROTONIX) IV  40 mg Intravenous Q24H   rosuvastatin  40 mg Oral Daily   sodium chloride flush  3 mL Intravenous Once   Continuous Infusions:  dextrose 5% lactated ringers with KCl 20 mEq/L 75 mL/hr at 06/12/19 1205   heparin 1,500 Units/hr (06/12/19 2321)   PRN Meds:.acetaminophen, guaiFENesin-dextromethorphan, nitroGLYCERIN, [DISCONTINUED] ondansetron **OR** ondansetron (ZOFRAN) IV, phenol, tiZANidine  Antibiotics  :    Anti-infectives (From admission, onward)   None       Time Spent in minutes  30   Lala Lund M.D on 06/13/2019 at 10:20 AM  To page go to [email or web address] - password [email or web address]  [facility] -  Office  [phone]   See all Orders from today for further details    Objective:   Vitals:   06/12/19 0933 06/12/19 2035 06/13/19 0505 06/13/19 0739  BP:  133/78 136/80 (!) 148/73  Pulse:  76 78 86  Resp:  '16 16 16  ' Temp:  97.9 F (36.6 C) 98.1 F (36.7 C) 97.8 F (36.6 C)  TempSrc:  Oral Tympanic Oral  SpO2: 93% 93% 93% 92%  Weight:      Height:        Wt Readings from Last 3 Encounters:  06/12/19 89.2 kg  05/09/19 95.3 kg  12/22/18 93 kg     Intake/Output Summary (Last 24 hours) at 06/13/2019 1020 Last data filed at 06/13/2019 0525 Gross per 24 hour  Intake 1166.13 ml  Output 1000 ml  Net 166.13 ml     Physical Exam  Awake Alert, Oriented X 3, No new F.N deficits, Normal affect Jeff.AT,PERRAL Supple Neck,No JVD, No cervical lymphadenopathy appriciated.  Symmetrical Chest wall movement,  Good air movement bilaterally, CTAB RRR,No Gallops, Rubs or new Murmurs, No Parasternal Heave Distended abdomen with hypoactive bowel sounds, NG in place with positive return, abdomen is nontender No Cyanosis, Clubbing or edema, No new Rash or bruise    Data Review:    CBC Recent Labs  Lab 06/07/19 0212 06/08/19 0420 06/09/19 0312 06/10/19 0130 06/12/19 0605 06/13/19 0510  WBC 4.4 4.6 5.5 5.2 2.0* 1.9*  HGB 14.7 14.2 14.6 13.6 13.5 13.1  HCT 43.5 42.2 43.3 40.1 39.8 39.1  PLT 122* 142* 154 166 225 272  MCV 88.6 88.7 89.8 89.3 90.0 89.1  MCH 29.9 29.8 30.3 30.3 30.5 29.8  MCHC 33.8 33.6 33.7 33.9 33.9 33.5  RDW 12.0 12.1 12.1 11.9 11.9 11.9  LYMPHSABS 0.6* 0.9 0.9 0.7  --  0.6*  MONOABS 0.3 0.4 0.4 0.4  --  0.3  EOSABS 0.0 0.1 0.1 0.1  --  0.1  BASOSABS 0.0 0.0 0.0 0.0  --  0.0    Chemistries  Recent Labs  Lab 06/07/19 0212 06/08/19 0420 06/09/19 0312 06/10/19 0130 06/11/19 0340 06/12/19 0605 06/13/19 0510  NA 138 137 139 140 139 139 139  K 3.7 3.7 4.0 4.0 4.0 3.8 3.8  CL 102 103 105 105 105 102 102  CO2 '26 24 27 25 24 23 29  ' GLUCOSE 101* 87 85 104* 86 89 124*  BUN '19 16 17 15 14 13 19  ' CREATININE 1.32* 1.26* 1.13 1.01 1.01 1.06 1.11  CALCIUM 8.8* 8.4* 8.8* 8.8* 8.6* 8.8* 8.7*  MG 2.0 1.9 2.1 2.1  --  2.0 2.4  AST 72* 51* 97* 65*  --   --  37  ALT 67* 59* 98* 84*  --   --  55*  ALKPHOS 44 43 48 58  --   --  89  BILITOT 1.3* 1.3* 1.1 1.3*  --   --  1.3*   ------------------------------------------------------------------------------------------------------------------ No results for input(s): CHOL, HDL, LDLCALC, TRIG, CHOLHDL, LDLDIRECT in the last 72 hours.  Lab Results  Component Value Date   HGBA1C 6.2 04/01/2018   ------------------------------------------------------------------------------------------------------------------ No results for input(s): TSH, T4TOTAL, T3FREE, THYROIDAB in the last 72 hours.  Invalid input(s): FREET3  Cardiac  Enzymes No results for input(s): CKMB, TROPONINI, MYOGLOBIN in the last 168 hours.  Invalid input(s): CK ------------------------------------------------------------------------------------------------------------------    Component Value Date/Time   BNP 28.6 06/13/2019 0510    Micro Results Recent Results (from the past 240 hour(s))  SARS Coronavirus 2 [facility] order, Performed in [facility] hospital lab) Nasopharyngeal Nasopharyngeal Swab     Status: Abnormal   Collection Time: 06/05/19  8:25 PM   Specimen: Nasopharyngeal Swab  Result Value Ref Range Status   SARS Coronavirus 2 POSITIVE (A) NEGATIVE Final    Comment: RESULT CALLED TO, READ BACK BY AND VERIFIED WITH: Chalmers Cater RN 06/05/19 2134 JDW (NOTE) If result is NEGATIVE SARS-CoV-2 target nucleic acids are NOT DETECTED. The SARS-CoV-2 RNA is generally detectable in upper and lower  respiratory specimens during the acute phase of infection. The lowest  concentration of SARS-CoV-2 viral copies this assay can detect is 250  copies / mL. A negative result does not preclude SARS-CoV-2 infection  and should not be used as the sole basis for treatment or other  patient management decisions.  A negative result may occur with  improper specimen collection / handling, submission of specimen other  than nasopharyngeal swab, presence of viral mutation(s) within the  areas targeted by this assay, and inadequate number of viral copies  (<250 copies / mL). A negative result must be combined with clinical  observations, patient history, and epidemiological information. If result is POSITIVE SARS-CoV-2 target nucleic acids are DETECTED. The SARS -CoV-2 RNA is generally detectable in upper and lower  respiratory specimens during the acute phase of infection.  Positive  results are indicative of active infection with SARS-CoV-2.  Clinical  correlation with patient history and other diagnostic information is  necessary to determine patient  infection status.  Positive results do  not rule out bacterial infection or co-infection with other viruses. If result is PRESUMPTIVE POSTIVE SARS-CoV-2 nucleic acids MAY BE PRESENT.   A presumptive positive result was obtained on the submitted specimen  and confirmed on repeat testing.  While 2019 novel coronavirus  (SARS-CoV-2) nucleic acids may be present in the submitted sample  additional confirmatory testing may be necessary for epidemiological  and / or clinical management purposes  to differentiate between  SARS-CoV-2 and  other Sarbecovirus currently known to infect humans.  If clinically indicated additional testing with an alternate test  methodology [phone]) is advise d. The SARS-CoV-2 RNA is generally  detectable in upper and lower respiratory specimens during the acute  phase of infection. The expected result is Negative. Fact Sheet for Patients:  StrictlyIdeas.no Fact Sheet for Healthcare Providers: BankingDealers.co.za This test is not yet approved or cleared by the [place] FDA and has been authorized for detection and/or diagnosis of SARS-CoV-2 by FDA under an Emergency Use Authorization (EUA).  This EUA will remain in effect (meaning this test can be used) for the duration of the COVID-19 declaration under Section 564(b)(1) of the Act, 21 U.S.C. section 360bbb-3(b)(1), unless the authorization is terminated or revoked sooner. Performed at [facility], [place] [address] [place], [place] [address]   MRSA PCR Screening     Status: None   Collection Time: 06/11/19  2:17 PM  Result Value Ref Range Status   MRSA by PCR NEGATIVE NEGATIVE Final    Comment:        The GeneXpert MRSA Assay (FDA approved for NASAL specimens only), is one component of a comprehensive MRSA colonization surveillance program. It is not intended to diagnose MRSA infection nor to guide or monitor treatment for MRSA  infections. Performed at [facility], [place] [address] [place],  [address]     Radiology Reports Dg Chest 2 View  Result Date: 06/05/2019 CLINICAL DATA:  Syncope x2 over the past week. History of atrial fibrillation. EXAM: CHEST - 2 VIEW COMPARISON:  Single-view of the chest 05/30/2018. FINDINGS: Lungs clear. Heart size normal. Aortic atherosclerosis noted. No pneumothorax or pleural fluid. No acute or focal bony abnormality. IMPRESSION: No acute disease. Atherosclerosis. Electronically Signed   By: Inge Rise M.D.   On: 06/05/2019 15:50   Dg Abd 1 View  Result Date: 06/13/2019 CLINICAL DATA:  Evaluation for ileus/HBO. EXAM: ABDOMEN - 1 VIEW COMPARISON:  06/12/2019. FINDINGS: NG tube noted with its tip over the distal stomach/proximal duodenum. Persistent dilated loops of small bowel again noted consistent with small bowel obstruction. Small-bowel obstruction may be partial. Prominent adynamic ileus could also present this fashion. Colonic gas pattern is normal. No free air is identified. No acute bony abnormality. IMPRESSION: NG tube noted with tip over the distal stomach/proximal duodenum. Persistent prominent small bowel distention without interim change. Findings are most consistent with small bowel obstruction. Electronically Signed   By: Marcello Moores  Register   On: 06/13/2019 10:15   Ct Abdomen Pelvis W Contrast  Result Date: 06/11/2019 CLINICAL DATA:  Abdominal pain, gastroenteritis or colitis, COVID-19 positive EXAM: CT ABDOMEN AND PELVIS WITH CONTRAST TECHNIQUE: Multidetector CT imaging of the abdomen and pelvis was performed using the standard protocol following bolus administration of intravenous contrast. CONTRAST:  87m OMNIPAQUE IOHEXOL 300 MG/ML SOLN, additional oral enteric contrast COMPARISON:  12/13/2018 FINDINGS: Lower chest: Small right pleural effusion and associated atelectasis. Scattered ground-glass opacities primarily in the included left lower lung. Coronary  artery calcifications. Hepatobiliary: No solid liver abnormality is seen. No gallstones, gallbladder wall thickening, or biliary dilatation. Pancreas: Unremarkable. No pancreatic ductal dilatation or surrounding inflammatory changes. Spleen: Normal in size without significant abnormality. Adrenals/Urinary Tract: Adrenal glands are unremarkable. Kidneys are normal, without renal calculi, solid lesion, or hydronephrosis. Bladder is unremarkable. Stomach/Bowel: Stomach is within normal limits. Appendix appears normal. The small bowel is diffusely distended to the terminal ileum, largest loops measuring 4.3 cm, the final loop of terminal ileum  decompressed with fluid in the colon to the rectum. Sigmoid diverticulosis. Esophagogastric tube is positioned with tip in the duodenum. Vascular/Lymphatic: Aortic atherosclerosis. No enlarged abdominal or pelvic lymph nodes. Reproductive: No mass or other significant abnormality. Other: No abdominal wall hernia or abnormality. Small volume ascites. Musculoskeletal: No acute or significant osseous findings. IMPRESSION: 1. The small bowel is diffusely distended to the terminal ileum, largest loops measuring 4.3 cm, the final loop of terminal ileum decompressed with fluid in the colon to the rectum. Findings favor ileus without definite findings to suggest obstructed transition point of the terminal ileum. Consider follow-up radiographs to observe for transit of oral contrast to the colon. 2.  Small volume ascites, likely reactive. 3. Esophagogastric tube is positioned with tip in the duodenum. Consider slight retraction for intragastric positioning. Electronically Signed   By: Eddie Candle M.D.   On: 06/11/2019 18:45   Dg Abd Portable 1v-small Bowel Obstruction Protocol-initial, 8 Hr Delay  Result Date: 06/12/2019 CLINICAL DATA:  Small bowel obstruction, 8 hour delay. EXAM: PORTABLE ABDOMEN - 1 VIEW COMPARISON:  Abdominal radiograph from earlier today. CT abdomen/pelvis from 1  day prior. FINDINGS: Enteric tube terminates in medial right abdomen, probably within the descending duodenum. There is persistent mild-to-moderate small bowel dilatation throughout the abdomen bilaterally, not substantially changed. There is possible passage of dilute oral contrast into the colon, difficult to determine given the dilution. No evidence of pneumatosis or pneumoperitoneum. No radiopaque nephrolithiasis. Excreted contrast is seen in the bladder. IMPRESSION: Persistent mild-to-moderate small bowel dilatation throughout the abdomen, not appreciably changed. Findings could represent adynamic ileus or partial distal small bowel obstruction. Enteric tube terminates in the medial right abdomen, probably within the descending duodenum. Electronically Signed   By: Ilona Sorrel M.D.   On: 06/12/2019 17:27   Dg Abd Portable 1v-small Bowel Protocol-position Verification  Result Date: 06/12/2019 CLINICAL DATA:  Small bowel obstruction EXAM: PORTABLE ABDOMEN - 1 VIEW COMPARISON:  June 11, 2019 FINDINGS: Nasogastric tube tip and side port are in the proximal duodenum. There remain loops of dilated bowel consistent with either ileus or a degree of bowel obstruction. No free air. Mild stool present in colon IMPRESSION: Nasogastric tube tip and side port in proximal duodenum. Loops mildly dilated bowel remain without progression. No free air. Electronically Signed   By: Lowella Grip III M.D.   On: 06/12/2019 08:04   Dg Abd Portable 1v  Result Date: 06/11/2019 CLINICAL DATA:  Small bowel obstruction EXAM: PORTABLE ABDOMEN - 1 VIEW COMPARISON:  06/09/2019 FINDINGS: There is persistent significant dilatation of small bowel loops, disproportionate to the dilatation of large bowel loops and consistent with small bowel obstruction. The degree of bowel distention has increased since previous exam, a small polyps measuring up to 5.3 centimeters. No evidence for free intraperitoneal air. IMPRESSION: Persistent  small bowel obstruction. Increased caliber of small bowel loops. Electronically Signed   By: Nolon Nations M.D.   On: 06/11/2019 08:52   Dg Abd Portable 1v  Result Date: 06/09/2019 CLINICAL DATA:  Encounter for abdominal pian and tightness that began yesterday night EXAM: PORTABLE ABDOMEN - 1 VIEW COMPARISON:  CT 12/13/2018 FINDINGS: There is disproportionate dilatation of small bowel relative to large bowel loops. No evidence for free intraperitoneal air on these supine views performed. IMPRESSION: Early or partial small bowel obstruction. Electronically Signed   By: Nolon Nations M.D.   On: 06/09/2019 17:23   Dg Addison Bailey G Tube Plc W/fl W/rad  Result Date: 06/11/2019 CLINICAL DATA:  Small-bowel distention. Patient requires gastroenteric decompression. Multiple attempts to place NG tube on the floor were unsuccessful and radiology placement has been requested. EXAM: NASO G TUBE PLACEMENT WITH FL AND WITH RAD CONTRAST:  None. FLUOROSCOPY TIME:  Fluoroscopy Time:  0 minutes and 6 seconds. Radiation Exposure Index (if provided by the fluoroscopic device): 3.3 mGy Number of Acquired Spot Images: 1 COMPARISON:  None. FINDINGS: The patient's right nostril was anesthetized with viscous lidocaine. NG tube tip was coated with surgilube. NG tube was easily advanced through the right nostril into the stomach on the first attempt. NG tube tip position was confirmed with a spot fluoro image. IMPRESSION: NG tube is ready to use with tip position in the distal stomach. Electronically Signed   By: Misty Stanley M.D.   On: 06/11/2019 19:36

## 2019-06-13 NOTE — Care Management Important Message (Signed)
Important Message  Patient Details  Name: [name] MRN: [id number] Date of Birth: 1951-08-10   Medicare Important Message Given:  Yes     [name] 06/13/2019, 4:55 PM

## 2019-06-13 NOTE — Care Management Important Message (Signed)
Important Message  Patient Details  Name: [name] MRN: [id number] Date of Birth: 1951-07-26   Medicare Important Message Given:  Yes - Important Message mailed due to current National Emergency   Verbal consent obtained due to current National Emergency  Relationship to patient: Child Contact Name: Chrystie Nose Call Date: 06/13/19  Time: [place] Phone: [phone] Outcome: Spoke with contact Important Message mailed to: Other (must enter comment)(EMAIL to [email or web address])      [name] 06/13/2019, 4:58 PM

## 2019-06-13 NOTE — Progress Notes (Signed)
ANTICOAGULATION CONSULT NOTE - Follow Up Consult  Pharmacy Consult for Heparin Indication: atrial fibrillation  Allergies  Allergen Reactions  . Felodipine Swelling    Legs became swollen  . Niacin Other (See Comments)    Made patient feel flushed    Patient Measurements: Height: 6\' 1"  (185.4 cm) Weight: 196 lb 10.4 oz (89.2 kg) IBW/kg (Calculated) : 79.9 Heparin Dosing Weight: TBW  Vital Signs: Temp: 97.9 F (36.6 C) (08/24 2035) Temp Source: Oral (08/24 2035) BP: 133/78 (08/24 2035) Pulse Rate: 76 (08/24 2035)  Labs: Recent Labs    06/10/19 0130 06/11/19 0340 06/12/19 0605 06/12/19 2000  HGB 13.6  --  13.5  --   HCT 40.1  --  39.8  --   PLT 166  --  225  --   APTT  --   --  39* 44*  HEPARINUNFRC  --   --  1.48*  --   CREATININE 1.01 1.01 1.06  --     Estimated Creatinine Clearance: 77.5 mL/min (by C-G formula based on SCr of 1.06 mg/dL).   Medications:  Infusions:  . dextrose 5% lactated ringers with KCl 20 mEq/L 75 mL/hr at 06/12/19 1205  . heparin 1,500 Units/hr (06/12/19 2321)    Assessment: 73 YOF with Afib and CHADSVASc score 3 on apixaban at home. Pharmacy consulted to transition to IV heparin. Apixaban has been discontinued and the last inpatient apixaban dose was 8/22 at 2100.  Heparin was started on 8/23 at  0930.  Today, 06/13/2019:  APTT 44, subtherapeutic   HL 1.48 (falsely elevated d/t recent apixaban), repeat level ordered with AM labs   CBC:  Hgb and Plt remain stable, WNL.  No bleeding or complications reported.   Goal of Therapy:  Heparin level 0.3-0.7 units/ml aPTT 66-102 seconds Monitor platelets by anticoagulation protocol: Yes   Plan:  Increase to heparin IV infusion at 1500 units/hr APTT 6 hours after rate change Daily APTT, heparin level, and CBC Continue to monitor H&H and platelets    Royetta Asal, PharmD, BCPS 06/13/2019 1:22 AM

## 2019-06-13 NOTE — Progress Notes (Signed)
Physical Therapy Treatment Patient Details Name: [name] MRN: [id number] DOB: Jul 17, 1951 Today's Date: 06/13/2019    History of Present Illness 68 y.o. male admitted on 06/05/19 for chest pain and syncopal episodes.  Incidently dx with COVID 19.  He has struggled acutely with lightheadedness and diarrhea.  Dx with dehydration and AKI, PAF, chest pressure, mild COVID 19 transaminitis.  Pt with other significant PMH of MI, HTN, CAD, R CEA, bradycardia, A-fib, R knee arthroscopy, coronary angio with stent and multiple heart caths.     PT Comments    Patient moving slowly due to abdominal and NG tube discomfort, however eager to get up and move with PT. Required increased time with transitional movements as his BP would drop (SBP at most dropped 18 mmHg) and then recover with seated rest. Ultimately able to walk in room x 2 and sit to stand x 4.     Follow Up Recommendations  No PT follow up;Supervision for mobility/OOB     Equipment Recommendations  None recommended by PT    Recommendations for Other Services       Precautions / Restrictions Precautions Precautions: None Restrictions Weight Bearing Restrictions: No    Mobility  Bed Mobility Overal bed mobility: Needs Assistance Bed Mobility: Supine to Sit;Sit to Supine     Supine to sit: Min guard(due to dizziness) Sit to supine: Modified independent (Device/Increase time)   General bed mobility comments: SBP did drop 18 mmHg with supine to sit   Transfers Overall transfer level: Needs assistance Equipment used: 1 person hand held assist Transfers: Sit to/from [facility] Sit to Stand: Min assist Stand pivot transfers: Min assist       General transfer comment: holding onto PTs UE or once up holding onto IV pole (with PT stabilizing pole)  Ambulation/Gait Ambulation/Gait assistance: Min assist Gait Distance (Feet): 20 Feet(25) Assistive device: 1 person hand held assist;IV Pole Gait  Pattern/deviations: Step-through pattern;Staggering right;Shuffle;Decreased stride length Gait velocity: decreased   General Gait Details: Pt with short, choppy, guarded steps after not having walked for several days while dealing with ileus.  Educated pt to stand and assess before starting to walk away from his support surface. Orthostatics cheched and were nearly positive with first sit to stand; recovered and dropped less with second and third transfers, O2 sats mid 90s on RA during gait measured via neonate finger probe   Stairs             Wheelchair Mobility    Modified Rankin (Stroke Patients Only)       Balance Overall balance assessment: Needs assistance Sitting-balance support: Feet supported;No upper extremity supported Sitting balance-Leahy Scale: Good     Standing balance support: Single extremity supported Standing balance-Leahy Scale: Poor Standing balance comment: slightly unsteady with no UE support                            Cognition Arousal/Alertness: Awake/alert Behavior During Therapy: WFL for tasks assessed/performed Overall Cognitive Status: Within Functional Limits for tasks assessed                                        Exercises General Exercises - Lower Extremity Ankle Circles/Pumps: AROM;Both;5 reps Heel Slides: AROM;Both;5 reps;Supine Hip ABduction/ADduction: AROM;Both;5 reps;Supine    General Comments General comments (skin integrity, edema, etc.): RN present at beginning of  session and clamped NG tube to allow ambulation. She was again present at end of session to reconnect him      Pertinent Vitals/Pain      Home Living                      Prior Function            PT Goals (current goals can now be found in the care plan section) Acute Rehab PT Goals Patient Stated Goal: to feel better, [place] and [place] again Time For Goal Achievement: 06/22/19 Potential to  Achieve Goals: Good Progress towards PT goals: Progressing toward goals    Frequency    Min 3X/week      PT Plan Current plan remains appropriate    Co-evaluation              AM-PAC PT "6 Clicks" Mobility   Outcome Measure  Help needed turning from your back to your side while in a flat bed without using bedrails?: None Help needed moving from lying on your back to sitting on the side of a flat bed without using bedrails?: None Help needed moving to and from a bed to a chair (including a wheelchair)?: A Little Help needed standing up from a chair using your arms (e.g., wheelchair or bedside chair)?: A Little Help needed to walk in hospital room?: A Little Help needed climbing 3-5 steps with a railing? : A Little 6 Click Score: 20    End of Session   Activity Tolerance: Treatment limited secondary to medical complications (Comment)(BP decreasing (not enough for orthostasis, however was sympt) Patient left: with call bell/phone within reach;in bed;with nursing/sitter in room Nurse Communication: Mobility status PT Visit Diagnosis: Difficulty in walking, not elsewhere classified (R26.2)     Time: [id number] PT Time Calculation (min) (ACUTE ONLY): 44 min  Charges:  $Gait Training: 23-37 mins $Therapeutic Exercise: 8-22 mins                       Barry Brunner, PT       [place] [facility] Lucylle Foulkes 06/13/2019, 5:15 PM

## 2019-06-13 NOTE — Progress Notes (Signed)
ANTICOAGULATION CONSULT NOTE - Follow Up Consult  Pharmacy Consult for Heparin Indication: atrial fibrillation  Allergies  Allergen Reactions  . Felodipine Swelling    Legs became swollen  . Niacin Other (See Comments)    Made patient feel flushed    Patient Measurements: Height: 6\' 1"  (185.4 cm) Weight: 196 lb 10.4 oz (89.2 kg) IBW/kg (Calculated) : 79.9 Heparin Dosing Weight: TBW  Vital Signs: Temp: 98.1 F (36.7 C) (08/25 0505) Temp Source: Tympanic (08/25 0505) BP: 136/80 (08/25 0505) Pulse Rate: 78 (08/25 0505)  Labs: Recent Labs    06/11/19 0340 06/12/19 0605 06/12/19 2000 06/13/19 0510  HGB  --  13.5  --  13.1  HCT  --  39.8  --  39.1  PLT  --  225  --  272  APTT  --  39* 44* 75*  HEPARINUNFRC  --  1.48*  --  1.04*  CREATININE 1.01 1.06  --  1.11    Estimated Creatinine Clearance: 74 mL/min (by C-G formula based on SCr of 1.11 mg/dL).   Medications:  Infusions:  . dextrose 5% lactated ringers with KCl 20 mEq/L 75 mL/hr at 06/12/19 1205  . heparin 1,500 Units/hr (06/12/19 2321)    Assessment: 63 YOF with Afib and CHADSVASc score 3 on apixaban at home. Pharmacy consulted to transition to IV heparin. Apixaban has been discontinued and the last inpatient apixaban dose was 8/22 at 2100.  Heparin was started on 8/23 at  0930.  Today, 06/13/2019:  APTT 75, subtherapeutic   HL 1.04 (falsely elevated d/t recent apixaban), but not yet correlating with aPTT  CBC:  Hgb and Plt remain stable, WNL.  No bleeding or complications reported.   Goal of Therapy:  Heparin level 0.3-0.7 units/ml aPTT 66-102 seconds Monitor platelets by anticoagulation protocol: Yes   Plan:   Continue  heparin IV infusion at 1500 units/hr  Obtain confirmatory aPTT in  6 hours   Daily APTT, heparin level, and CBC  Continue to monitor H&H and platelets    Royetta Asal, PharmD, BCPS 06/13/2019 6:34 AM

## 2019-06-13 NOTE — Progress Notes (Signed)
ANTICOAGULATION CONSULT NOTE - Follow Up Consult  Pharmacy Consult for Heparin Indication: atrial fibrillation  Allergies  Allergen Reactions  . Felodipine Swelling    Legs became swollen  . Niacin Other (See Comments)    Made patient feel flushed    Patient Measurements: Height: 6\' 1"  (185.4 cm) Weight: 196 lb 10.4 oz (89.2 kg) IBW/kg (Calculated) : 79.9 Heparin Dosing Weight: TBW  Vital Signs: Temp: 97.8 F (36.6 C) (08/25 0739) Temp Source: Oral (08/25 0739) BP: 148/73 (08/25 0739) Pulse Rate: 86 (08/25 0739)  Labs: Recent Labs    06/11/19 0340 06/12/19 0605 06/12/19 2000 06/13/19 0510  HGB  --  13.5  --  13.1  HCT  --  39.8  --  39.1  PLT  --  225  --  272  APTT  --  39* 44* 75*  HEPARINUNFRC  --  1.48*  --  1.04*  CREATININE 1.01 1.06  --  1.11    Estimated Creatinine Clearance: 74 mL/min (by C-G formula based on SCr of 1.11 mg/dL).   Medications:  Infusions:  . dextrose 5% lactated ringers with KCl 20 mEq/L 75 mL/hr at 06/12/19 1205  . heparin 1,500 Units/hr (06/12/19 2321)    Assessment: 17 YOF with Afib and CHADSVASc score 3 on apixaban at home. Pharmacy consulted to transition to IV heparin. Apixaban has been discontinued and the last inpatient apixaban dose was 8/22 at 2100.  Heparin was started on 8/23 at  0930.  Today, 06/13/2019: APTT 62, remains therapeutic on heparin at 1500 units/hr. HL 1.04 with AM labs, remains falsely elevated d/t recent apixaban CBC:  Hgb and Plt remain stable, WNL. No bleeding or complications reported. GI: KUB shows continued ileus versus SBO.  Continue bowel rest, and NG tube.  Passing some flatus per MD notes.  LBM 8/22.  Goal of Therapy:  Heparin level 0.3-0.7 units/ml aPTT 66-102 seconds Monitor platelets by anticoagulation protocol: Yes   Plan:   Continue heparin IV infusion at 1500 units/hr  Daily APTT, heparin level, and CBC  Continue to monitor for s/s bleeding, complications.  Follow up plans from  general surgery regarding GI function, enteral access.  Gretta Arab PharmD, BCPS Clinical pharmacist phone 7am- 5pm: [phone] 06/13/2019 11:58 AM

## 2019-06-14 ENCOUNTER — Inpatient Hospital Stay (HOSPITAL_COMMUNITY): Payer: Medicare Other

## 2019-06-14 DIAGNOSIS — K56609 Unspecified intestinal obstruction, unspecified as to partial versus complete obstruction: Secondary | ICD-10-CM

## 2019-06-14 LAB — COMPREHENSIVE METABOLIC PANEL
ALT: 57 U/L — ABNORMAL HIGH (ref 0–44)
AST: 48 U/L — ABNORMAL HIGH (ref 15–41)
Albumin: 3.2 g/dL — ABNORMAL LOW (ref 3.5–5.0)
Alkaline Phosphatase: 92 U/L (ref 38–126)
Anion gap: 9 (ref 5–15)
BUN: 20 mg/dL (ref 8–23)
CO2: 26 mmol/L (ref 22–32)
Calcium: 8.6 mg/dL — ABNORMAL LOW (ref 8.9–10.3)
Chloride: 104 mmol/L (ref 98–111)
Creatinine, Ser: 1.12 mg/dL (ref 0.61–1.24)
GFR calc Af Amer: >60 mL/min (ref >60)
GFR calc non Af Amer: >60 mL/min (ref >60)
Glucose, Bld: 116 mg/dL — ABNORMAL HIGH (ref 70–99)
Potassium: 3.8 mmol/L (ref 3.5–5.1)
Sodium: 139 mmol/L (ref 135–145)
Total Bilirubin: 1.4 mg/dL — ABNORMAL HIGH (ref 0.3–1.2)
Total Protein: 6.6 g/dL (ref 6.5–8.1)

## 2019-06-14 LAB — CBC WITH DIFFERENTIAL/PLATELET
Abs Immature Granulocytes: 0.02 10*3/uL (ref 0.00–0.07)
Basophils Absolute: 0 10*3/uL (ref 0.0–0.1)
Basophils Relative: 1 %
Eosinophils Absolute: 0.1 10*3/uL (ref 0.0–0.5)
Eosinophils Relative: 3 %
HCT: 39.8 % (ref 39.0–52.0)
Hemoglobin: 13.3 g/dL (ref 13.0–17.0)
Immature Granulocytes: 1 %
Lymphocytes Relative: 20 %
Lymphs Abs: 0.7 10*3/uL (ref 0.7–4.0)
MCH: 30.2 pg (ref 26.0–34.0)
MCHC: 33.4 g/dL (ref 30.0–36.0)
MCV: 90.2 fL (ref 80.0–100.0)
Monocytes Absolute: 0.7 10*3/uL (ref 0.1–1.0)
Monocytes Relative: 18 %
Neutro Abs: 2.1 10*3/uL (ref 1.7–7.7)
Neutrophils Relative %: 57 %
Platelets: 308 10*3/uL (ref 150–400)
RBC: 4.41 MIL/uL (ref 4.22–5.81)
RDW: 12.1 % (ref 11.5–15.5)
WBC: 3.6 10*3/uL — ABNORMAL LOW (ref 4.0–10.5)
nRBC: 0 % (ref 0.0–0.2)

## 2019-06-14 LAB — BRAIN NATRIURETIC PEPTIDE: B Natriuretic Peptide: 32.8 pg/mL (ref 0.0–100.0)

## 2019-06-14 LAB — HEPARIN LEVEL (UNFRACTIONATED): Heparin Unfractionated: 0.79 IU/mL — ABNORMAL HIGH (ref 0.30–0.70)

## 2019-06-14 LAB — MAGNESIUM: Magnesium: 2.3 mg/dL (ref 1.7–2.4)

## 2019-06-14 LAB — APTT: aPTT: 80 seconds — ABNORMAL HIGH (ref 24–36)

## 2019-06-14 MED ORDER — HYDROMORPHONE HCL 1 MG/ML IJ SOLN
0.7500 mg | INTRAMUSCULAR | Status: DC | PRN
  Administered 2019-06-14: 0.75 mg via INTRAVENOUS
  Filled 2019-06-14: qty 1

## 2019-06-14 MED ORDER — ACETAMINOPHEN 10 MG/ML IV SOLN
1000.0000 mg | Freq: Four times a day (QID) | INTRAVENOUS | Status: AC
  Administered 2019-06-14 – 2019-06-15 (×4): 1000 mg via INTRAVENOUS
  Filled 2019-06-14 (×4): qty 100

## 2019-06-14 NOTE — Progress Notes (Signed)
ANTICOAGULATION CONSULT NOTE - Follow Up Consult  Pharmacy Consult for Heparin Indication: atrial fibrillation  Allergies  Allergen Reactions  . Felodipine Swelling    Legs became swollen  . Niacin Other (See Comments)    Made patient feel flushed    Patient Measurements: Height: 6\' 1"  (185.4 cm) Weight: 196 lb 10.4 oz (89.2 kg) IBW/kg (Calculated) : 79.9 Heparin Dosing Weight: TBW  Vital Signs: Temp: 98 F (36.7 C) (08/26 0355) Temp Source: Axillary (08/26 0355) BP: 140/78 (08/26 0355) Pulse Rate: 78 (08/26 0355)  Labs: Recent Labs    06/12/19 0605  06/13/19 0510 06/13/19 1045 06/14/19 0410  HGB 13.5  --  13.1  --  13.3  HCT 39.8  --  39.1  --  39.8  PLT 225  --  272  --  308  APTT 39*   < > 75* 62* 80*  HEPARINUNFRC 1.48*  --  1.04*  --  0.79*  CREATININE 1.06  --  1.11  --  1.12   < > = values in this interval not displayed.    Estimated Creatinine Clearance: 73.3 mL/min (by C-G formula based on SCr of 1.12 mg/dL).   Medications:  Infusions:  . dextrose 5% lactated ringers with KCl 20 mEq/L 100 mL/hr at 06/13/19 2018  . heparin 1,500 Units/hr (06/12/19 2321)    Assessment: 82 YOF with Afib and CHADSVASc score 3 on apixaban at home. Pharmacy consulted to transition to IV heparin. Apixaban has been discontinued and the last inpatient apixaban dose was 8/22 at 2100.  Heparin was started on 8/23 at  0930.  Today, 06/14/2019:  APTT 80, remains therapeutic on heparin at 1500 units/hr.  HL 0.79 with AM labs, remains falsely elevated d/t recent apixaban, not yet correlating with aPTT levels  CBC:  Hgb and Plt remain stable, WNL.  No bleeding or complications reported per RN   GI: KUB shows continued ileus versus SBO.  Continue bowel rest, and NG tube.  Passing some flatus per MD notes.  LBM 8/22.  Goal of Therapy:  Heparin level 0.3-0.7 units/ml aPTT 66-102 seconds Monitor platelets by anticoagulation protocol: Yes   Plan:   Continue heparin IV  infusion at 1500 units/hr  Daily APTT, heparin level, and CBC  Continue to monitor for s/s bleeding, complications.  Follow up plans from general surgery regarding GI function, enteral access.   Royetta Asal, PharmD, BCPS 06/14/2019 6:00 AM

## 2019-06-14 NOTE — TOC Transition Note (Signed)
Transition of Care [facility]) - CM/SW Discharge Note   Patient Details  Name: [name] MRN: [id number] Date of Birth: 09/03/1951  Transition of Care [facility]) CM/SW Contact:  Ninfa Meeker, RN Phone Number: [phone] (working remotely) 06/14/2019, 12:03 PM   Clinical Narrative:   68 yr old gentleman that was admitted for COVID 19 treatment Had partial small bowel obstruction on adm. NGT was placed on 8/24. Patient has started having BM's, NG will be clamped today per MD notes and will start clear liquid diet. Case Manager will continue to monitor.           Patient Goals and CMS Choice        Discharge Placement                       Discharge Plan and Services                                     Social Determinants of Health (SDOH) Interventions     Readmission Risk Interventions No flowsheet data found.

## 2019-06-14 NOTE — Progress Notes (Signed)
[facility] PROGRESS NOTE    Progress Note  [name]  [id number] DOB: [birth year] DOA: 06/05/2019 PCP: [name], FNP     Brief Narrative:   [name] is an 68 y.o. male past medical history significant for coronary artery disease, paroxysmal atrial fibrillation hypertension who has been feeling well until Thursday when he was sitting in his golf cart and suddenly started getting lightheaded.  As per witnesses he passed out and woke up immediately felt some chest tightness.  He describes as bricks sitting on his chest.  Work told him his heart rate was slow and fast but does not remember the rate, he took 2 nitroglycerin that relieved the pain.  In the ER he was found to have a COVID-19 infection and developed partial small bowel obstruction in-house  Assessment/Plan:   COVID-19 virus infection: Chest x-ray is clear, inflammatory markers are unremarkable no cough or shortness of breath.  Syncope likely due to orthostatic hypotension: In the setting of antihypertensive medication and diarrhea. He has been aggressively hydrated. Telemetry showed no acute findings. Physical therapy working with the patient. He remains orthostatic. Continue IV fluids we will recheck orthostatics.  Acute kidney injury: Likely prerenal azotemia resolved with IV fluid hydration.  Paroxismal atrial fibrillation: With a chads vas score of 3. Will need to 2D echo as an outpatient with cardiology. He was started on Eliquis. Currently rate controlled.  Chest pressure resolved: Likely due to atrial fibrillation in the setting of orthostasis. Currently on aspirin and Eliquis and he is chest pain-free. Will need to follow-up with his cardiologist upon discharge for possible stress test. High-sensitivity troponin on admission was 17.  Lead EKG on admission shows MAT rate controlled left fascicular posterior block no ST segment changes.  COVID-19 induced gastroenteritis/SBO: He was  having diarrhea with was treated with Imodium, which led to him mild ileus. He then developed a small bowel obstruction which was treated conservatively with NG tube placement. Surgery was consulted a KUB showed a small bowel obstruction a CT scan did not show a point of transition.  Though he had an distended abdomen but passing gas. Patient had 3 bowel movement this morning, will clamp NG tube and allow clear liquid diet.  Essential hypertension: Continue to hold antihypertensive medication.   DVT prophylaxis: lovenxo Family Communication:none Disposition Plan/Barrier to D/C: home in 2-3 days Code Status:     Code Status Orders  (From admission, onward)         Start     Ordered   06/05/19 2230  Full code  Continuous     06/05/19 2244        Code Status History    Date Active Date Inactive Code Status Order ID Comments User Context   05/30/2018 1902 05/31/2018 2154 Full Code [id number]  Skeet Latch, MD ED   12/02/2015 2227 12/03/2015 1852 Full Code [id number]  Erma Heritage, PA Inpatient   Advance Care Planning Activity        IV Access:    Peripheral IV   Procedures and diagnostic studies:   Dg Abd 1 View  Result Date: 06/13/2019 CLINICAL DATA:  Evaluation for ileus/HBO. EXAM: ABDOMEN - 1 VIEW COMPARISON:  06/12/2019. FINDINGS: NG tube noted with its tip over the distal stomach/proximal duodenum. Persistent dilated loops of small bowel again noted consistent with small bowel obstruction. Small-bowel obstruction may be partial. Prominent adynamic ileus could also present this fashion. Colonic gas pattern is normal. No free air is identified.  No acute bony abnormality. IMPRESSION: NG tube noted with tip over the distal stomach/proximal duodenum. Persistent prominent small bowel distention without interim change. Findings are most consistent with small bowel obstruction. Electronically Signed   By: Marcello Moores  Register   On: 06/13/2019 10:15   Dg Abd Portable  1v-small Bowel Obstruction Protocol-initial, 8 Hr Delay  Result Date: 06/12/2019 CLINICAL DATA:  Small bowel obstruction, 8 hour delay. EXAM: PORTABLE ABDOMEN - 1 VIEW COMPARISON:  Abdominal radiograph from earlier today. CT abdomen/pelvis from 1 day prior. FINDINGS: Enteric tube terminates in medial right abdomen, probably within the descending duodenum. There is persistent mild-to-moderate small bowel dilatation throughout the abdomen bilaterally, not substantially changed. There is possible passage of dilute oral contrast into the colon, difficult to determine given the dilution. No evidence of pneumatosis or pneumoperitoneum. No radiopaque nephrolithiasis. Excreted contrast is seen in the bladder. IMPRESSION: Persistent mild-to-moderate small bowel dilatation throughout the abdomen, not appreciably changed. Findings could represent adynamic ileus or partial distal small bowel obstruction. Enteric tube terminates in the medial right abdomen, probably within the descending duodenum. Electronically Signed   By: Ilona Sorrel M.D.   On: 06/12/2019 17:27     Medical Consultants:    None.  Anti-Infectives:  None  Subjective:    [name] relates his abdomen is less intended, he has had 3 bowel movements today feels much better compared to yesterday.  Objective:    Vitals:   06/13/19 1700 06/13/19 1955 06/14/19 0355 06/14/19 0737  BP: (!) 145/72 132/80 140/78 118/73  Pulse:  76 78 64  Resp: 18 18 19 16   Temp: 97.9 F (36.6 C) 98 F (36.7 C) 98 F (36.7 C) 98 F (36.7 C)  TempSrc: Oral Oral Axillary Oral  SpO2:  93% 93% 93%  Weight:      Height:       SpO2: 93 % O2 Flow Rate (L/min): 2 L/min   Intake/Output Summary (Last 24 hours) at 06/14/2019 0804 Last data filed at 06/14/2019 0700 Gross per 24 hour  Intake 1315.46 ml  Output 1300 ml  Net 15.46 ml   Filed Weights   06/06/19 0032 06/12/19 0531  Weight: 88 kg 89.2 kg    Exam: General exam: In no acute distress.  Respiratory system: Good air movement and clear to auscultation. Cardiovascular system: S1 & S2 heard, RRR. No JVD. Gastrointestinal system: Abdomen is mildly distended positive bowel sounds soft no rebound or guarding. Central nervous system: Alert and oriented. No focal neurological deficits. Extremities: No pedal edema. Skin: No rashes, lesions or ulcers Psychiatry: Judgement and insight appear normal. Mood & affect appropriate.    Data Reviewed:    Labs: Basic Metabolic Panel: Recent Labs  Lab 06/09/19 0312 06/10/19 0130 06/11/19 0340 06/12/19 0605 06/13/19 0510 06/14/19 0410  NA 139 140 139 139 139 139  K 4.0 4.0 4.0 3.8 3.8 3.8  CL 105 105 105 102 102 104  CO2 27 25 24 23 29 26   GLUCOSE 85 104* 86 89 124* 116*  BUN 17 15 14 13 19 20   CREATININE 1.13 1.01 1.01 1.06 1.11 1.12  CALCIUM 8.8* 8.8* 8.6* 8.8* 8.7* 8.6*  MG 2.1 2.1  --  2.0 2.4 2.3   GFR Estimated Creatinine Clearance: 73.3 mL/min (by C-G formula based on SCr of 1.12 mg/dL). Liver Function Tests: Recent Labs  Lab 06/08/19 0420 06/09/19 0312 06/10/19 0130 06/13/19 0510 06/14/19 0410  AST 51* 97* 65* 37 48*  ALT 59* 98* 84* 55* 57*  ALKPHOS 43 48 58 89 92  BILITOT 1.3* 1.1 1.3* 1.3* 1.4*  PROT 6.7 6.7 7.0 6.9 6.6  ALBUMIN 3.3* 3.2* 3.2* 3.2* 3.2*   No results for input(s): LIPASE, AMYLASE in the last 168 hours. No results for input(s): AMMONIA in the last 168 hours. Coagulation profile No results for input(s): INR, PROTIME in the last 168 hours. COVID-19 Labs  No results for input(s): DDIMER, FERRITIN, LDH, CRP in the last 72 hours.  Lab Results  Component Value Date   SARSCOV2NAA POSITIVE (A) 06/05/2019    CBC: Recent Labs  Lab 06/08/19 0420 06/09/19 0312 06/10/19 0130 06/12/19 0605 06/13/19 0510 06/14/19 0410  WBC 4.6 5.5 5.2 2.0* 1.9* 3.6*  NEUTROABS 3.2 4.0 3.9  --  1.0* 2.1  HGB 14.2 14.6 13.6 13.5 13.1 13.3  HCT 42.2 43.3 40.1 39.8 39.1 39.8  MCV 88.7 89.8 89.3 90.0 89.1  90.2  PLT 142* 154 166 225 272 308   Cardiac Enzymes: No results for input(s): CKTOTAL, CKMB, CKMBINDEX, TROPONINI in the last 168 hours. BNP (last 3 results) No results for input(s): PROBNP in the last 8760 hours. CBG: Recent Labs  Lab 06/11/19 1215  GLUCAP 85   D-Dimer: No results for input(s): DDIMER in the last 72 hours. Hgb A1c: No results for input(s): HGBA1C in the last 72 hours. Lipid Profile: No results for input(s): CHOL, HDL, LDLCALC, TRIG, CHOLHDL, LDLDIRECT in the last 72 hours. Thyroid function studies: No results for input(s): TSH, T4TOTAL, T3FREE, THYROIDAB in the last 72 hours.  Invalid input(s): FREET3 Anemia work up: No results for input(s): VITAMINB12, FOLATE, FERRITIN, TIBC, IRON, RETICCTPCT in the last 72 hours. Sepsis Labs: Recent Labs  Lab 06/10/19 0130 06/12/19 0605 06/13/19 0510 06/14/19 0410  WBC 5.2 2.0* 1.9* 3.6*   Microbiology Recent Results (from the past 240 hour(s))  SARS Coronavirus 2 [facility] order, Performed in [facility] hospital lab) Nasopharyngeal Nasopharyngeal Swab     Status: Abnormal   Collection Time: 06/05/19  8:25 PM   Specimen: Nasopharyngeal Swab  Result Value Ref Range Status   SARS Coronavirus 2 POSITIVE (A) NEGATIVE Final    Comment: RESULT CALLED TO, READ BACK BY AND VERIFIED WITH: Chalmers Cater RN 06/05/19 2134 JDW (NOTE) If result is NEGATIVE SARS-CoV-2 target nucleic acids are NOT DETECTED. The SARS-CoV-2 RNA is generally detectable in upper and lower  respiratory specimens during the acute phase of infection. The lowest  concentration of SARS-CoV-2 viral copies this assay can detect is 250  copies / mL. A negative result does not preclude SARS-CoV-2 infection  and should not be used as the sole basis for treatment or other  patient management decisions.  A negative result may occur with  improper specimen collection / handling, submission of specimen other  than nasopharyngeal swab, presence of viral mutation(s)  within the  areas targeted by this assay, and inadequate number of viral copies  (<250 copies / mL). A negative result must be combined with clinical  observations, patient history, and epidemiological information. If result is POSITIVE SARS-CoV-2 target nucleic acids are DETECTED. The SARS -CoV-2 RNA is generally detectable in upper and lower  respiratory specimens during the acute phase of infection.  Positive  results are indicative of active infection with SARS-CoV-2.  Clinical  correlation with patient history and other diagnostic information is  necessary to determine patient infection status.  Positive results do  not rule out bacterial infection or co-infection with other viruses. If result is PRESUMPTIVE POSTIVE SARS-CoV-2 nucleic acids  MAY BE PRESENT.   A presumptive positive result was obtained on the submitted specimen  and confirmed on repeat testing.  While 2019 novel coronavirus  (SARS-CoV-2) nucleic acids may be present in the submitted sample  additional confirmatory testing may be necessary for epidemiological  and / or clinical management purposes  to differentiate between  SARS-CoV-2 and other Sarbecovirus currently known to infect humans.  If clinically indicated additional testing with an alternate test  methodology [phone]) is advise d. The SARS-CoV-2 RNA is generally  detectable in upper and lower respiratory specimens during the acute  phase of infection. The expected result is Negative. Fact Sheet for Patients:  StrictlyIdeas.no Fact Sheet for Healthcare Providers: BankingDealers.co.za This test is not yet approved or cleared by the [place] FDA and has been authorized for detection and/or diagnosis of SARS-CoV-2 by FDA under an Emergency Use Authorization (EUA).  This EUA will remain in effect (meaning this test can be used) for the duration of the COVID-19 declaration under Section 564(b)(1) of the Act,  21 U.S.C. section 360bbb-3(b)(1), unless the authorization is terminated or revoked sooner. Performed at [facility], [place] [address], [place], [place] [address]   MRSA PCR Screening     Status: None   Collection Time: 06/11/19  2:17 PM  Result Value Ref Range Status   MRSA by PCR NEGATIVE NEGATIVE Final    Comment:        The GeneXpert MRSA Assay (FDA approved for NASAL specimens only), is one component of a comprehensive MRSA colonization surveillance program. It is not intended to diagnose MRSA infection nor to guide or monitor treatment for MRSA infections. Performed at [facility], [place] [address], [place], [place] [address]      Medications:   . aspirin EC  81 mg Oral QHS  . Chlorhexidine Gluconate Cloth  6 each Topical Daily  . finasteride  5 mg Oral Daily  . ketorolac  7.5 mg Intravenous Q8H  . pantoprazole (PROTONIX) IV  40 mg Intravenous Q24H  . rosuvastatin  40 mg Oral Daily  . sodium chloride flush  3 mL Intravenous Once   Continuous Infusions: . dextrose 5% lactated ringers with KCl 20 mEq/L 100 mL/hr at 06/13/19 2019  . heparin 1,500 Units/hr (06/12/19 2321)     LOS: 8 days   Charlynne Cousins  [facility]  06/14/2019, 8:04 AM

## 2019-06-14 NOTE — Progress Notes (Signed)
Occupational Therapy Treatment Patient Details Name: [name] MRN: [id number] DOB: Aug 20, 1951 Today's Date: 06/14/2019    History of present illness 68 y.o. male admitted on 06/05/19 for chest pain and syncopal episodes.  Incidently dx with COVID 19.  He has struggled acutely with lightheadedness and diarrhea.  Dx with dehydration and AKI, PAF, chest pressure, mild COVID 19 transaminitis.  Pt with other significant PMH of MI, HTN, CAD, R CEA, bradycardia, A-fib, R knee arthroscopy, coronary angio with stent and multiple heart caths.    OT comments  Pt progressing towards OT goals this session, reports that he has been having BMs and using the BSC. NG tub clamped, and able to walk back and forth in the room with IV pole for support at min guard. Pt reports feeling nauseated and returned supine. VSS throughout session from HR, RR, and O2 (RA). OT will continue to follow acutely.   Follow Up Recommendations  Supervision/Assistance - 24 hour    Equipment Recommendations  3 in 1 bedside commode    Recommendations for Other Services PT consult    Precautions / Restrictions Precautions Precautions: None Restrictions Weight Bearing Restrictions: No       Mobility Bed Mobility Overal bed mobility: Modified Independent                Transfers Overall transfer level: Needs assistance Equipment used: (IV pole) Transfers: Sit to/from Stand Sit to Stand: Min assist         General transfer comment: holding onto OTs UE or once up holding onto IV pole (with OT stabilizing pole)    Balance Overall balance assessment: Needs assistance Sitting-balance support: Feet supported;No upper extremity supported Sitting balance-Leahy Scale: Good     Standing balance support: Single extremity supported Standing balance-Leahy Scale: Poor Standing balance comment: slightly unsteady with no UE support                           ADL either performed or assessed with clinical  judgement   ADL Overall ADL's : Needs assistance/impaired     Grooming: Wash/dry hands;Wash/dry face;Set up;Sitting Grooming Details (indicate cue type and reason): EOB                 Toilet Transfer: Min guard;Ambulation Toilet Transfer Details (indicate cue type and reason): pushing IV pole         Functional mobility during ADLs: Min guard(pushing IV pole)       Vision       Perception     Praxis      Cognition Arousal/Alertness: Awake/alert Behavior During Therapy: WFL for tasks assessed/performed Overall Cognitive Status: Within Functional Limits for tasks assessed                                          Exercises     Shoulder Instructions       General Comments NG tube clamped for in room mobility    Pertinent Vitals/ Pain       Pain Assessment: Faces Faces Pain Scale: Hurts a little bit Pain Location: Abdomen Pain Descriptors / Indicators: Cramping;Discomfort Pain Intervention(s): Limited activity within patient's tolerance;Monitored during session;Repositioned  Home Living  Prior Functioning/Environment              Frequency  Min 3X/week        Progress Toward Goals  OT Goals(current goals can now be found in the care plan section)  Progress towards OT goals: Progressing toward goals  Acute Rehab OT Goals Patient Stated Goal: to feel better, [place] again OT Goal Formulation: With patient Time For Goal Achievement: 06/09/19 Potential to Achieve Goals: Good  Plan Discharge plan remains appropriate    Co-evaluation                 AM-PAC OT "6 Clicks" Daily Activity     Outcome Measure   Help from another person eating meals?: None Help from another person taking care of personal grooming?: A Little Help from another person toileting, which includes using toliet, bedpan, or urinal?: A Little Help from  another person bathing (including washing, rinsing, drying)?: None Help from another person to put on and taking off regular upper body clothing?: None Help from another person to put on and taking off regular lower body clothing?: A Little 6 Click Score: 21    End of Session Equipment Utilized During Treatment: Gait belt  OT Visit Diagnosis: Unsteadiness on feet (R26.81);Muscle weakness (generalized) (M62.81)   Activity Tolerance Patient tolerated treatment well   Patient Left in bed;with call bell/phone within reach   Nurse Communication Mobility status        Time: [id number] OT Time Calculation (min): 13 min  Charges: OT General Charges $OT Visit: 1 Visit OT Treatments $Therapeutic Activity: 8-22 mins  Hulda Humphrey OTR/L Acute Rehabilitation Services Pager: [phone] Office: [place] 06/14/2019, 5:45 PM

## 2019-06-14 NOTE — Progress Notes (Signed)
Pt taking small amounts of clear liquids.  Complaints of increasing abdominal pain and nausea.  PRN zofran and Tylenol given with little effect.  Physician notified.  Orders to place NGT back to suction and to make pt NPO at this time. Awaiting orders for more pain meds/antiemetics PRN.

## 2019-06-15 ENCOUNTER — Inpatient Hospital Stay (HOSPITAL_COMMUNITY): Payer: Medicare Other

## 2019-06-15 ENCOUNTER — Inpatient Hospital Stay: Payer: Self-pay

## 2019-06-15 LAB — CBC WITH DIFFERENTIAL/PLATELET
Abs Immature Granulocytes: 0.03 10*3/uL (ref 0.00–0.07)
Basophils Absolute: 0 10*3/uL (ref 0.0–0.1)
Basophils Relative: 1 %
Eosinophils Absolute: 0.1 10*3/uL (ref 0.0–0.5)
Eosinophils Relative: 2 %
HCT: 38.9 % — ABNORMAL LOW (ref 39.0–52.0)
Hemoglobin: 12.5 g/dL — ABNORMAL LOW (ref 13.0–17.0)
Immature Granulocytes: 1 %
Lymphocytes Relative: 23 %
Lymphs Abs: 0.9 10*3/uL (ref 0.7–4.0)
MCH: 29.6 pg (ref 26.0–34.0)
MCHC: 32.1 g/dL (ref 30.0–36.0)
MCV: 92 fL (ref 80.0–100.0)
Monocytes Absolute: 0.6 10*3/uL (ref 0.1–1.0)
Monocytes Relative: 17 %
Neutro Abs: 2.2 10*3/uL (ref 1.7–7.7)
Neutrophils Relative %: 56 %
Platelets: 298 10*3/uL (ref 150–400)
RBC: 4.23 MIL/uL (ref 4.22–5.81)
RDW: 12.1 % (ref 11.5–15.5)
WBC: 3.8 10*3/uL — ABNORMAL LOW (ref 4.0–10.5)
nRBC: 0 % (ref 0.0–0.2)

## 2019-06-15 LAB — APTT: aPTT: 108 seconds — ABNORMAL HIGH (ref 24–36)

## 2019-06-15 LAB — COMPREHENSIVE METABOLIC PANEL
ALT: 93 U/L — ABNORMAL HIGH (ref 0–44)
AST: 107 U/L — ABNORMAL HIGH (ref 15–41)
Albumin: 3 g/dL — ABNORMAL LOW (ref 3.5–5.0)
Alkaline Phosphatase: 114 U/L (ref 38–126)
Anion gap: 10 (ref 5–15)
BUN: 16 mg/dL (ref 8–23)
CO2: 25 mmol/L (ref 22–32)
Calcium: 8.5 mg/dL — ABNORMAL LOW (ref 8.9–10.3)
Chloride: 105 mmol/L (ref 98–111)
Creatinine, Ser: 1.02 mg/dL (ref 0.61–1.24)
GFR calc Af Amer: >60 mL/min (ref >60)
GFR calc non Af Amer: >60 mL/min (ref >60)
Glucose, Bld: 85 mg/dL (ref 70–99)
Potassium: 3.8 mmol/L (ref 3.5–5.1)
Sodium: 140 mmol/L (ref 135–145)
Total Bilirubin: 1.2 mg/dL (ref 0.3–1.2)
Total Protein: 6.3 g/dL — ABNORMAL LOW (ref 6.5–8.1)

## 2019-06-15 LAB — HEPARIN LEVEL (UNFRACTIONATED)
Heparin Unfractionated: 0.54 IU/mL (ref 0.30–0.70)
Heparin Unfractionated: 0.58 IU/mL (ref 0.30–0.70)

## 2019-06-15 LAB — GLUCOSE, CAPILLARY
Glucose-Capillary: 105 mg/dL — ABNORMAL HIGH (ref 70–99)
Glucose-Capillary: 87 mg/dL (ref 70–99)
Glucose-Capillary: 128 mg/dL — ABNORMAL HIGH (ref 70–99)

## 2019-06-15 LAB — BRAIN NATRIURETIC PEPTIDE: B Natriuretic Peptide: 73.6 pg/mL (ref 0.0–100.0)

## 2019-06-15 LAB — MAGNESIUM: Magnesium: 2.3 mg/dL (ref 1.7–2.4)

## 2019-06-15 MED ORDER — INSULIN ASPART 100 UNIT/ML ~~LOC~~ SOLN: 0.0000 [IU] | SUBCUTANEOUS | Status: DC

## 2019-06-15 MED ORDER — POTASSIUM CHLORIDE 2 MEQ/ML IV SOLN
INTRAVENOUS | Status: DC
  Filled 2019-06-15 (×2): qty 1000

## 2019-06-15 MED ORDER — SODIUM CHLORIDE 0.9% FLUSH: 10.0000 mL | INTRAVENOUS | Status: DC | PRN

## 2019-06-15 MED ORDER — KCL IN DEXTROSE-NACL 20-5-0.45 MEQ/L-%-% IV SOLN
INTRAVENOUS | Status: AC
  Administered 2019-06-15: 09:00:00 via INTRAVENOUS
  Filled 2019-06-15: qty 1000

## 2019-06-15 MED ORDER — POTASSIUM CHLORIDE 2 MEQ/ML IV SOLN: INTRAVENOUS | Status: DC

## 2019-06-15 MED ORDER — TRAVASOL 10 % IV SOLN
INTRAVENOUS | Status: AC
  Administered 2019-06-15: 17:00:00 via INTRAVENOUS
  Filled 2019-06-15: qty 462

## 2019-06-15 MED ORDER — KCL IN DEXTROSE-NACL 20-5-0.45 MEQ/L-%-% IV SOLN
INTRAVENOUS | Status: AC
  Administered 2019-06-15 – 2019-06-16 (×2): via INTRAVENOUS
  Filled 2019-06-15 (×2): qty 1000

## 2019-06-15 MED ORDER — INSULIN ASPART 100 UNIT/ML ~~LOC~~ SOLN
0.0000 [IU] | SUBCUTANEOUS | Status: DC
  Administered 2019-06-15 – 2019-06-21 (×14): 1 [IU] via SUBCUTANEOUS
  Administered 2019-06-22: 2 [IU] via SUBCUTANEOUS
  Administered 2019-06-22 – 2019-06-23 (×4): 1 [IU] via SUBCUTANEOUS

## 2019-06-15 MED ORDER — SODIUM CHLORIDE 0.9% FLUSH
10.0000 mL | Freq: Two times a day (BID) | INTRAVENOUS | Status: DC
  Administered 2019-06-15 – 2019-06-16 (×2): 10 mL
  Administered 2019-06-16 – 2019-06-17 (×2): 20 mL
  Administered 2019-06-17: 10 mL
  Administered 2019-06-18: 20 mL
  Administered 2019-06-18 – 2019-06-19 (×3): 10 mL
  Administered 2019-06-20: 20 mL
  Administered 2019-06-20 – 2019-06-21 (×3): 10 mL
  Administered 2019-06-21: 20 mL
  Administered 2019-06-22 (×2): 10 mL
  Administered 2019-06-23: 40 mL

## 2019-06-15 NOTE — Progress Notes (Signed)
[place] NOTE   Pharmacy Consult for TPN Indication: Prolonged Ileus   Patient Measurements: Height: 6\' 1"  (185.4 cm) Weight: 196 lb 10.4 oz (89.2 kg) IBW/kg (Calculated) : 79.9 TPN AdjBW (KG): 89.2 Body mass index is 25.94 kg/m. Usual Weight: ~200lb  Assessment: 47 YOM with significant hx of CAD, pAF, HTN presenting with syncope and CP.  Found to be COVID+ and treatment of diarrhea led to ileus now has developed partial SBO while admitted, currently with NGT on suction.  Has been passing gas and had multiple BM on 8/26.  Mostly NPO since 8/23.   GI: NGT with output at 700 ml in last 24 hours, awaiting further surgery recs/imaging.  Holding narcotics Endo: A1c 6.2, no DM tx PTA:  CBGs 85-116 - on D5W + 1/2NS w KCl 20@75  Insulin requirements in the past 24 hours: n/a Lytes: K 3.8, Mg 2.3, others wnl Renal: SCr 1.02, UOP 0.3 ml/kg/hr - on D5W + 1/2NS w KCl 20@75  Pulm: RA Cards: Brady, SBP 140s - rosuva, bASA Afib on heparin gtt Hepatobil: AST 107, ALT 93,  (trending up) Tbili 1.2, TG 108 at BL Neuro:  [id number] +  TPN Access: PICC planned 8/27 TPN start date: 8/27 Nutritional Goals (Awaiting RD recs): KCal: 2000-2400 Protein: 96-120 Fluid: >2L  Goal TPN rate is 55ml/hr  Current Nutrition:  NPO D5W + 1/2NS w KCl 20@75   Plan:  Start TPN at 35 mL/hr - titrate as able This TPN provides 46 g of protein, 176 g of dextrose, and 21 g of lipids which provides 994 kCals per day, meeting ~50% of patient needs Electrolytes in TPN: Inc K, others standard, Cl:Ac 1:1 Add MVI, trace elements MWF d/t shortage Add sensitive SSI and adjust as needed with TPN start Decrease D5W + 1/2NS w KCl 20 to 24ml/hr with TPN start TPN labs in AM   Bertis Ruddy, PharmD Clinical Pharmacist Please check AMION for all [place] numbers 06/15/2019 11:22 AM

## 2019-06-15 NOTE — Progress Notes (Signed)
ANTICOAGULATION CONSULT NOTE - Follow Up Consult  Pharmacy Consult for Heparin Indication: atrial fibrillation  Allergies  Allergen Reactions  . Felodipine Swelling    Legs became swollen  . Niacin Other (See Comments)    Made patient feel flushed    Patient Measurements: Height: 6\' 1"  (185.4 cm) Weight: 196 lb 10.4 oz (89.2 kg) IBW/kg (Calculated) : 79.9 Heparin Dosing Weight: TBW  Vital Signs: Temp: 97.7 F (36.5 C) (08/27 0409) Temp Source: Oral (08/27 0409) BP: 147/89 (08/27 0409) Pulse Rate: 62 (08/27 0409)  Labs: Recent Labs    06/13/19 0510 06/13/19 1045 06/14/19 0410 06/15/19 0300  HGB 13.1  --  13.3 12.5*  HCT 39.1  --  39.8 38.9*  PLT 272  --  308 298  APTT 75* 62* 80* 108*  HEPARINUNFRC 1.04*  --  0.79* 0.54  CREATININE 1.11  --  1.12 1.02    Estimated Creatinine Clearance: 80.5 mL/min (by C-G formula based on SCr of 1.02 mg/dL).   Medications:  Infusions:  . acetaminophen 1,000 mg (06/15/19 0048)  . heparin 1,500 Units/hr (06/15/19 0042)    Assessment: 88 YOF with Afib and CHADSVASc score 3 on apixaban at home. Pharmacy consulted to transition to IV heparin. Apixaban has been discontinued and the last inpatient apixaban dose was 8/22 at 2100.  Heparin was started on 8/23 at  0930.  APTT increased to 108 but heparin level down to 0.54? They are not correlating but will switch to heparin levels.  Goal of Therapy:  Heparin level 0.3-0.7 units/ml aPTT 66-102 seconds Monitor platelets by anticoagulation protocol: Yes   Plan:  Continue heparin IV infusion at 1,500 units/hr Monitor daily heparin level, CBC, s/s of bleed Follow up plans from general surgery regarding GI function, enteral access.  Elenor Quinones, PharmD, BCPS, BCIDP Clinical Pharmacist 06/15/2019 6:39 AM

## 2019-06-15 NOTE — Progress Notes (Signed)
Initial Nutrition Assessment   RD working remotely.   DOCUMENTATION CODES:   Not applicable  INTERVENTION:   TPN per Pharmacy  Monitor for diet advancement and tolerance  No Phosphorus; recommend checking today prior to initiation of TPN and supplementing if low   NUTRITION DIAGNOSIS:   Inadequate oral intake related to acute illness, altered GI function as evidenced by NPO status, meal completion < 25%.  GOAL:   Patient will meet greater than or equal to 90% of their needs   MONITOR:   Diet advancement, Labs, Weight trends, Other (Comment), I & O's, Skin(TPN)  REASON FOR ASSESSMENT:   Consult New TPN/TNA  ASSESSMENT:   68 yo male admitted with syncope, AKI, COVID 19 + viral gastroenteritis. PMH includes CAD, HTN, HLD, GERD, MI  8/17 Admit 8/22 NPO, KUB showed SBO 8/23 NG tube inserted, CT scan favoring ileus vs obstruction  8/26 CL diet  Pt on Heart Healthy diet from 8/17 until 8/21; recorded po intake limited but <25% on average during that time frame. Essentially NPO/CL since 8/21 (6 days) with inadequate oral intake since admission (x 9 days) making pt appropriate for initiation of TPN at this time given continued ileus  TPN initiated today  Noted +nausea and abdominal pain; per PT notes, NG clamped for mobility caused increase in nausea and abdominal pain. Pt does not appear ready for oral intake. Pt did not eat any of CL diet this AM  Per weight encounters, weight down since admission. Current wt 84.3 kg; admission weight 88 kg. Weight on 8/24 89 kg. Pt is net + 3.8 L  Suspect patient may meet criteria for acute malnutrition given increased needs due to acute infection with prolonged inadequate nutritional intake, possible weight loss. Unable to confirm, not able to discuss diet and weight history with pt at this time, unable to perform NFPE.  Labs: reviewed, phosphorus has not been checked, potassium wdl, Creatinine wdl, CBG 105 Meds: ss novolog, D5-1/2  NS with KCl at 40 ml/hr   NUTRITION - FOCUSED PHYSICAL EXAM:  Unable to assess, working remotely  Diet Order:   Diet Order            Diet clear liquid Room service appropriate? Yes; Fluid consistency: Thin  Diet effective now              EDUCATION NEEDS:   Not appropriate for education at this time  Skin:  Skin Assessment: Reviewed RN Assessment(no skin breakdown per RN assessment)  Last BM:  8/27  Height:   Ht Readings from Last 1 Encounters:  06/12/19 6\' 1"  (1.854 m)    Weight:   Wt Readings from Last 1 Encounters:  06/15/19 84.3 kg    Ideal Body Weight:  83.6 kg  BMI:  Body mass index is 24.52 kg/m.  Estimated Nutritional Needs:   Kcal:  [id number] kcals  Protein:  126-152  Fluid:  >/= 2 L    Cate Rico Junker MS, RDN, LDN, CNSC [phone] Pager  [phone] Weekend/On-Call Pager

## 2019-06-15 NOTE — Plan of Care (Signed)
Pt continues to have abdominal pain / distention. NG tube to [place] int Sx.

## 2019-06-15 NOTE — Progress Notes (Signed)
Physical Therapy Treatment Patient Details Name: [name] MRN: [id number] DOB: 03-Apr-1951 Today's Date: 06/15/2019    History of Present Illness 68 y.o. male admitted on 06/05/19 for chest pain and syncopal episodes.  Incidently dx with COVID 19.  He has struggled acutely with lightheadedness and diarrhea.  Dx with dehydration and AKI, PAF, chest pressure, mild COVID 19 transaminitis. Devleoped ileus with NG tube placed. Pt with other significant PMH of MI, HTN, CAD, R CEA, bradycardia, A-fib, R knee arthroscopy, coronary angio with stent and multiple heart caths.     PT Comments    Patient only able to tolerate completing orthostatic BP measures (see vitals, his sBP did initially decr by 10 mmHg supine to sit, however increased with each subsequent measure). His nausea and increasing abdominal pain from 4 to 7 while NG clamped for mobility caused me to return him to supine and call for RN to come assess him and NG tube. Patient had walked, bathed, used BSC earlier this a.m. with nursing and is motivated to move. For him to ask to lie down to me was an indication of how poorly he felt in standing.    Follow Up Recommendations  No PT follow up;Supervision for mobility/OOB     Equipment Recommendations  None recommended by PT    Recommendations for Other Services       Precautions / Restrictions Precautions Precautions: Other (comment) Precaution Comments: syncope, orthostasis, NG tube Restrictions Weight Bearing Restrictions: No    Mobility  Bed Mobility Overal bed mobility: Needs Assistance Bed Mobility: Supine to Sit;Sit to Supine     Supine to sit: Min guard Sit to supine: Min guard   General bed mobility comments: due to dizziness, 2 IVs, NG tube; NOT orthostatic  Transfers Overall transfer level: Needs assistance Equipment used: None Transfers: Sit to/from Stand Sit to Stand: Min guard         General transfer comment: used bedrail to push to stand and then  able to release  Ambulation/Gait             General Gait Details: deferred due to increasing nausea and abd pain during 3 minutes of standing for orthostatic BP   Stairs             Wheelchair Mobility    Modified Rankin (Stroke Patients Only)       Balance Overall balance assessment: Needs assistance Sitting-balance support: Feet supported;No upper extremity supported Sitting balance-Leahy Scale: Good     Standing balance support: No upper extremity supported Standing balance-Leahy Scale: Fair                              Cognition Arousal/Alertness: Awake/alert Behavior During Therapy: WFL for tasks assessed/performed Overall Cognitive Status: Within Functional Limits for tasks assessed                                        Exercises      General Comments General comments (skin integrity, edema, etc.): Prior to session, RN confirmed ok to clamp NG tube for PT session. Educated on importance of continued mobility, AROM to stimulate his gut to improve function      Pertinent Vitals/Pain Pain Assessment: 0-10 Pain Score: 7 (initially 4 progressed to 7 during session) Pain Location: Abdomen Pain Descriptors / Indicators: Cramping;Discomfort Pain Intervention(s): Limited activity within  patient's tolerance;Monitored during session;Repositioned;Other (comment)(called RN and reconnected NG to suction)    Home Living                      Prior Function            PT Goals (current goals can now be found in the care plan section) Acute Rehab PT Goals Patient Stated Goal: to feel better, [place] Time For Goal Achievement: 06/22/19 Potential to Achieve Goals: Good Progress towards PT goals: Not progressing toward goals - comment(nausea pain limiting today)    Frequency    Min 3X/week      PT Plan Current plan remains appropriate    Co-evaluation               AM-PAC PT "6 Clicks" Mobility   Outcome Measure  Help needed turning from your back to your side while in a flat bed without using bedrails?: None Help needed moving from lying on your back to sitting on the side of a flat bed without using bedrails?: None Help needed moving to and from a bed to a chair (including a wheelchair)?: A Little Help needed standing up from a chair using your arms (e.g., wheelchair or bedside chair)?: A Little Help needed to walk in hospital room?: A Little Help needed climbing 3-5 steps with a railing? : A Little 6 Click Score: 20    End of Session   Activity Tolerance: Treatment limited secondary to medical complications (Comment)(Nausea incr and belly pain) Patient left: with call bell/phone within reach;in bed;with nursing/sitter in room Nurse Communication: Other (comment)(incr nausea and belly pain while NG clamped for activity) PT Visit Diagnosis: Difficulty in walking, not elsewhere classified (R26.2)     Time: [id number] PT Time Calculation (min) (ACUTE ONLY): 47 min  Charges:  $Therapeutic Activity: 38-52 mins                       Barry Brunner, PT       Tennyson P Aila Terra 06/15/2019, 10:29 AM

## 2019-06-15 NOTE — Progress Notes (Signed)
[facility] PROGRESS NOTE    Progress Note  [name]  [id number] DOB: [birth year] DOA: 06/05/2019 PCP: [name], FNP     Brief Narrative:   [name] is an 68 y.o. male past medical history significant for coronary artery disease, paroxysmal atrial fibrillation hypertension who has been feeling well until Thursday when he was sitting in his golf cart and suddenly started getting lightheaded.  As per witnesses he passed out and woke up immediately felt some chest tightness.  He describes as bricks sitting on his chest.  Work told him his heart rate was slow and fast but does not remember the rate, he took 2 nitroglycerin that relieved the pain.  In the ER he was found to have a COVID-19 infection and developed partial small bowel obstruction in-house  Assessment/Plan:   COVID-19 virus infection: Chest x-ray is clear, inflammatory markers are unremarkable no cough or shortness of breath.  Syncope likely due to orthostatic hypotension: In the setting of antihypertensive medication and diarrhea. Continue aggressive hydration telemetry showed no acute findings. Physical therapy is working with the patient recommended 24-hour supervision at home. Recheck orthostatic vitals.  Acute kidney injury: Likely prerenal azotemia resolved with IV fluid hydration.  Paroxismal atrial fibrillation: With a chads vas score of 3. Currently rate controlled continue Eliquis. Follow-up with cardiology as an outpatient.  Chest pressure resolved: Likely due to atrial fibrillation in the setting of orthostasis. Currently on aspirin and Eliquis and he is chest pain-free. Will need to follow-up with his cardiologist upon discharge for possible stress test.  COVID-19 induced gastroenteritis/SBO: Keep him n.p.o., continue nasogastric tube at intermittent suction. The patient had multiple bowel movements yesterday, but then started developing abdominal pain. KUB is pending this  morning. Awaiting surgery team recommendations. Continue D5W half-normal saline with potassium supplementation continue to follow strict I's and O's check a basic metabolic panel in the morning, avoid narcotics.  Essential hypertension: Continue to hold antihypertensive medication.   DVT prophylaxis: lovenxo Family Communication:none Disposition Plan/Barrier to D/C: unable to determine Code Status:     Code Status Orders  (From admission, onward)         Start     Ordered   06/05/19 2230  Full code  Continuous     06/05/19 2244        Code Status History    Date Active Date Inactive Code Status Order ID Comments User Context   05/30/2018 1902 05/31/2018 2154 Full Code [id number]  Skeet Latch, MD ED   12/02/2015 2227 12/03/2015 1852 Full Code [id number]  Erma Heritage, PA Inpatient   Advance Care Planning Activity        IV Access:    Peripheral IV   Procedures and diagnostic studies:   Dg Abd 1 View  Result Date: 06/13/2019 CLINICAL DATA:  Evaluation for ileus/HBO. EXAM: ABDOMEN - 1 VIEW COMPARISON:  06/12/2019. FINDINGS: NG tube noted with its tip over the distal stomach/proximal duodenum. Persistent dilated loops of small bowel again noted consistent with small bowel obstruction. Small-bowel obstruction may be partial. Prominent adynamic ileus could also present this fashion. Colonic gas pattern is normal. No free air is identified. No acute bony abnormality. IMPRESSION: NG tube noted with tip over the distal stomach/proximal duodenum. Persistent prominent small bowel distention without interim change. Findings are most consistent with small bowel obstruction. Electronically Signed   By: Mathews   On: 06/13/2019 10:15   Dg Abd Portable 1v  Result Date: 06/14/2019 CLINICAL  DATA:  Small bowel obstruction. EXAM: PORTABLE ABDOMEN - 1 VIEW COMPARISON:  06/13/2019 FINDINGS: Nasogastric tube appears to be extending into the duodenum and likely in the  descending portion of the duodenum. There continues to be dilated loops of small bowel throughout the abdomen. The degree of small bowel distension is similar to the previous examination. Small amount of gas in the stomach. Limited evaluation for free air on this supine image. IMPRESSION: 1. No change in the appearance of the small bowel obstruction. 2. Nasogastric tube tip is in the descending duodenum. Electronically Signed   By: Markus Daft M.D.   On: 06/14/2019 09:05     Medical Consultants:    None.  Anti-Infectives:  None  Subjective:    [name] relate he is still has some abdominal discomfort.  Objective:    Vitals:   06/14/19 0737 06/14/19 1519 06/14/19 2004 06/15/19 0409  BP: 118/73 (!) 147/87 139/79 (!) 147/89  Pulse: 64 78 61 62  Resp: 16     Temp: 98 F (36.7 C) 97.8 F (36.6 C) 97.8 F (36.6 C) 97.7 F (36.5 C)  TempSrc: Oral Oral Oral Oral  SpO2: 93% 95%  95%  Weight:      Height:       SpO2: 95 % O2 Flow Rate (L/min): 2 L/min   Intake/Output Summary (Last 24 hours) at 06/15/2019 0723 Last data filed at 06/15/2019 [id number] Gross per 24 hour  Intake 325.84 ml  Output 1251 ml  Net -925.16 ml   Filed Weights   06/06/19 0032 06/12/19 0531  Weight: 88 kg 89.2 kg    Exam: General exam: In no acute distress. Respiratory system: Good air movement and clear to auscultation. Cardiovascular system: S1 & S2 heard, RRR. No JVD, murmurs, rubs, gallops or clicks.  Gastrointestinal system:  Abdomen is mildly distended no rebound or guarding positive bowel sounds Central nervous system: Alert and oriented. No focal neurological deficits. Extremities: No pedal edema. Skin: No rashes, lesions or ulcers Psychiatry: Judgement and insight appear normal. Mood & affect appropriate.    Data Reviewed:    Labs: Basic Metabolic Panel: Recent Labs  Lab 06/10/19 0130 06/11/19 0340 06/12/19 0605 06/13/19 0510 06/14/19 0410 06/15/19 0300  NA 140 139 139 139 139  140  K 4.0 4.0 3.8 3.8 3.8 3.8  CL 105 105 102 102 104 105  CO2 25 24 23 29 26 25   GLUCOSE 104* 86 89 124* 116* 85  BUN 15 14 13 19 20 16   CREATININE 1.01 1.01 1.06 1.11 1.12 1.02  CALCIUM 8.8* 8.6* 8.8* 8.7* 8.6* 8.5*  MG 2.1  --  2.0 2.4 2.3 2.3   GFR Estimated Creatinine Clearance: 80.5 mL/min (by C-G formula based on SCr of 1.02 mg/dL). Liver Function Tests: Recent Labs  Lab 06/09/19 0312 06/10/19 0130 06/13/19 0510 06/14/19 0410 06/15/19 0300  AST 97* 65* 37 48* 107*  ALT 98* 84* 55* 57* 93*  ALKPHOS 48 58 89 92 114  BILITOT 1.1 1.3* 1.3* 1.4* 1.2  PROT 6.7 7.0 6.9 6.6 6.3*  ALBUMIN 3.2* 3.2* 3.2* 3.2* 3.0*   No results for input(s): LIPASE, AMYLASE in the last 168 hours. No results for input(s): AMMONIA in the last 168 hours. Coagulation profile No results for input(s): INR, PROTIME in the last 168 hours. COVID-19 Labs  No results for input(s): DDIMER, FERRITIN, LDH, CRP in the last 72 hours.  Lab Results  Component Value Date   SARSCOV2NAA POSITIVE (A) 06/05/2019  CBC: Recent Labs  Lab 06/09/19 0312 06/10/19 0130 06/12/19 0605 06/13/19 0510 06/14/19 0410 06/15/19 0300  WBC 5.5 5.2 2.0* 1.9* 3.6* 3.8*  NEUTROABS 4.0 3.9  --  1.0* 2.1 2.2  HGB 14.6 13.6 13.5 13.1 13.3 12.5*  HCT 43.3 40.1 39.8 39.1 39.8 38.9*  MCV 89.8 89.3 90.0 89.1 90.2 92.0  PLT 154 166 225 272 308 298   Cardiac Enzymes: No results for input(s): CKTOTAL, CKMB, CKMBINDEX, TROPONINI in the last 168 hours. BNP (last 3 results) No results for input(s): PROBNP in the last 8760 hours. CBG: Recent Labs  Lab 06/11/19 1215  GLUCAP 85   D-Dimer: No results for input(s): DDIMER in the last 72 hours. Hgb A1c: No results for input(s): HGBA1C in the last 72 hours. Lipid Profile: No results for input(s): CHOL, HDL, LDLCALC, TRIG, CHOLHDL, LDLDIRECT in the last 72 hours. Thyroid function studies: No results for input(s): TSH, T4TOTAL, T3FREE, THYROIDAB in the last 72 hours.   Invalid input(s): FREET3 Anemia work up: No results for input(s): VITAMINB12, FOLATE, FERRITIN, TIBC, IRON, RETICCTPCT in the last 72 hours. Sepsis Labs: Recent Labs  Lab 06/12/19 0605 06/13/19 0510 06/14/19 0410 06/15/19 0300  WBC 2.0* 1.9* 3.6* 3.8*   Microbiology Recent Results (from the past 240 hour(s))  SARS Coronavirus 2 [facility] order, Performed in [facility] hospital lab) Nasopharyngeal Nasopharyngeal Swab     Status: Abnormal   Collection Time: 06/05/19  8:25 PM   Specimen: Nasopharyngeal Swab  Result Value Ref Range Status   SARS Coronavirus 2 POSITIVE (A) NEGATIVE Final    Comment: RESULT CALLED TO, READ BACK BY AND VERIFIED WITH: Chalmers Cater RN 06/05/19 2134 JDW (NOTE) If result is NEGATIVE SARS-CoV-2 target nucleic acids are NOT DETECTED. The SARS-CoV-2 RNA is generally detectable in upper and lower  respiratory specimens during the acute phase of infection. The lowest  concentration of SARS-CoV-2 viral copies this assay can detect is 250  copies / mL. A negative result does not preclude SARS-CoV-2 infection  and should not be used as the sole basis for treatment or other  patient management decisions.  A negative result may occur with  improper specimen collection / handling, submission of specimen other  than nasopharyngeal swab, presence of viral mutation(s) within the  areas targeted by this assay, and inadequate number of viral copies  (<250 copies / mL). A negative result must be combined with clinical  observations, patient history, and epidemiological information. If result is POSITIVE SARS-CoV-2 target nucleic acids are DETECTED. The SARS -CoV-2 RNA is generally detectable in upper and lower  respiratory specimens during the acute phase of infection.  Positive  results are indicative of active infection with SARS-CoV-2.  Clinical  correlation with patient history and other diagnostic information is  necessary to determine patient infection status.   Positive results do  not rule out bacterial infection or co-infection with other viruses. If result is PRESUMPTIVE POSTIVE SARS-CoV-2 nucleic acids MAY BE PRESENT.   A presumptive positive result was obtained on the submitted specimen  and confirmed on repeat testing.  While 2019 novel coronavirus  (SARS-CoV-2) nucleic acids may be present in the submitted sample  additional confirmatory testing may be necessary for epidemiological  and / or clinical management purposes  to differentiate between  SARS-CoV-2 and other Sarbecovirus currently known to infect humans.  If clinically indicated additional testing with an alternate test  methodology [phone]) is advise d. The SARS-CoV-2 RNA is generally  detectable in upper and lower respiratory  specimens during the acute  phase of infection. The expected result is Negative. Fact Sheet for Patients:  StrictlyIdeas.no Fact Sheet for Healthcare Providers: BankingDealers.co.za This test is not yet approved or cleared by the [place] FDA and has been authorized for detection and/or diagnosis of SARS-CoV-2 by FDA under an Emergency Use Authorization (EUA).  This EUA will remain in effect (meaning this test can be used) for the duration of the COVID-19 declaration under Section 564(b)(1) of the Act, 21 U.S.C. section 360bbb-3(b)(1), unless the authorization is terminated or revoked sooner. Performed at [facility], [place] [address] [place], [place] [address]   MRSA PCR Screening     Status: None   Collection Time: 06/11/19  2:17 PM  Result Value Ref Range Status   MRSA by PCR NEGATIVE NEGATIVE Final    Comment:        The GeneXpert MRSA Assay (FDA approved for NASAL specimens only), is one component of a comprehensive MRSA colonization surveillance program. It is not intended to diagnose MRSA infection nor to guide or monitor treatment for MRSA infections. Performed at [facility], [place] [address] [place], [place] [address]      Medications:   . aspirin EC  81 mg Oral QHS  . Chlorhexidine Gluconate Cloth  6 each Topical Daily  . finasteride  5 mg Oral Daily  . ketorolac  7.5 mg Intravenous Q8H  . pantoprazole (PROTONIX) IV  40 mg Intravenous Q24H  . rosuvastatin  40 mg Oral Daily  . sodium chloride flush  3 mL Intravenous Once   Continuous Infusions: . acetaminophen 1,000 mg (06/15/19 0639)  . heparin 1,500 Units/hr (06/15/19 0042)     LOS: 9 days   Charlynne Cousins  [facility]  06/15/2019, 7:23 AM

## 2019-06-15 NOTE — Progress Notes (Signed)
ANTICOAGULATION CONSULT NOTE - Follow Up Consult  Pharmacy Consult for Heparin Indication: atrial fibrillation  Allergies  Allergen Reactions  . Felodipine Swelling    Legs became swollen  . Niacin Other (See Comments)    Made patient feel flushed    Patient Measurements: Height: 6\' 1"  (185.4 cm) Weight: 185 lb 13.6 oz (84.3 kg) IBW/kg (Calculated) : 79.9 Heparin Dosing Weight: TBW  Vital Signs: Temp: 97.5 F (36.4 C) (08/27 0743) Temp Source: Oral (08/27 0743) BP: 141/74 (08/27 0743) Pulse Rate: 49 (08/27 0743)  Labs: Recent Labs    06/13/19 0510 06/13/19 1045 06/14/19 0410 06/15/19 0300 06/15/19 1215  HGB 13.1  --  13.3 12.5*  --   HCT 39.1  --  39.8 38.9*  --   PLT 272  --  308 298  --   APTT 75* 62* 80* 108*  --   HEPARINUNFRC 1.04*  --  0.79* 0.54 0.58  CREATININE 1.11  --  1.12 1.02  --     Estimated Creatinine Clearance: 80.5 mL/min (by C-G formula based on SCr of 1.02 mg/dL).   Medications:  Infusions:  . dextrose 5 % and 0.45 % NaCl with KCl 20 mEq/L 75 mL/hr at 06/15/19 0925  . dextrose 5 % and 0.45% NaCl 1,000 mL with potassium chloride 20 mEq infusion    . heparin 1,500 Units/hr (06/15/19 0042)  . TPN ADULT (ION)      Assessment: 43 YOF with Afib and CHADSVASc score 3 on apixaban at home. Pharmacy consulted to transition to IV heparin. Apixaban has been discontinued and the last inpatient apixaban dose was 8/22 at 2100.  Heparin was started on 8/23 at  0930.  Heparin level came back therapeutic again so it's correlating.   Goal of Therapy:  Heparin level 0.3-0.7 units/ml aPTT 66-102 seconds Monitor platelets by anticoagulation protocol: Yes   Plan:  Continue heparin IV infusion at 1,500 units/hr Monitor daily heparin level, CBC, s/s of bleed Starting Sharon, PharmD, BCIDP, AAHIVP, CPP Infectious Disease Pharmacist 06/15/2019 1:12 PM

## 2019-06-16 ENCOUNTER — Inpatient Hospital Stay (HOSPITAL_COMMUNITY): Payer: Medicare Other

## 2019-06-16 LAB — CBC WITH DIFFERENTIAL/PLATELET
Abs Immature Granulocytes: 0.05 10*3/uL (ref 0.00–0.07)
Basophils Absolute: 0 10*3/uL (ref 0.0–0.1)
Basophils Relative: 1 %
Eosinophils Absolute: 0.1 10*3/uL (ref 0.0–0.5)
Eosinophils Relative: 3 %
HCT: 37.9 % — ABNORMAL LOW (ref 39.0–52.0)
Hemoglobin: 12.2 g/dL — ABNORMAL LOW (ref 13.0–17.0)
Immature Granulocytes: 1 %
Lymphocytes Relative: 24 %
Lymphs Abs: 0.8 10*3/uL (ref 0.7–4.0)
MCH: 30.7 pg (ref 26.0–34.0)
MCHC: 32.2 g/dL (ref 30.0–36.0)
MCV: 95.2 fL (ref 80.0–100.0)
Monocytes Absolute: 0.5 10*3/uL (ref 0.1–1.0)
Monocytes Relative: 14 %
Neutro Abs: 2 10*3/uL (ref 1.7–7.7)
Neutrophils Relative %: 57 %
Platelets: 292 10*3/uL (ref 150–400)
RBC: 3.98 MIL/uL — ABNORMAL LOW (ref 4.22–5.81)
RDW: 12.5 % (ref 11.5–15.5)
WBC: 3.5 10*3/uL — ABNORMAL LOW (ref 4.0–10.5)
nRBC: 0 % (ref 0.0–0.2)

## 2019-06-16 LAB — GLUCOSE, CAPILLARY
Glucose-Capillary: 111 mg/dL — ABNORMAL HIGH (ref 70–99)
Glucose-Capillary: 115 mg/dL — ABNORMAL HIGH (ref 70–99)
Glucose-Capillary: 118 mg/dL — ABNORMAL HIGH (ref 70–99)
Glucose-Capillary: 120 mg/dL — ABNORMAL HIGH (ref 70–99)
Glucose-Capillary: 121 mg/dL — ABNORMAL HIGH (ref 70–99)
Glucose-Capillary: 134 mg/dL — ABNORMAL HIGH (ref 70–99)

## 2019-06-16 LAB — COMPREHENSIVE METABOLIC PANEL
ALT: 107 U/L — ABNORMAL HIGH (ref 0–44)
AST: 84 U/L — ABNORMAL HIGH (ref 15–41)
Albumin: 2.7 g/dL — ABNORMAL LOW (ref 3.5–5.0)
Alkaline Phosphatase: 123 U/L (ref 38–126)
Anion gap: 6 (ref 5–15)
BUN: 16 mg/dL (ref 8–23)
CO2: 25 mmol/L (ref 22–32)
Calcium: 8 mg/dL — ABNORMAL LOW (ref 8.9–10.3)
Chloride: 105 mmol/L (ref 98–111)
Creatinine, Ser: 0.87 mg/dL (ref 0.61–1.24)
GFR calc Af Amer: >60 mL/min (ref >60)
GFR calc non Af Amer: >60 mL/min (ref >60)
Glucose, Bld: 301 mg/dL — ABNORMAL HIGH (ref 70–99)
Potassium: 4.7 mmol/L (ref 3.5–5.1)
Sodium: 136 mmol/L (ref 135–145)
Total Bilirubin: 1 mg/dL (ref 0.3–1.2)
Total Protein: 6.2 g/dL — ABNORMAL LOW (ref 6.5–8.1)

## 2019-06-16 LAB — BASIC METABOLIC PANEL
Anion gap: 11 (ref 5–15)
BUN: 16 mg/dL (ref 8–23)
CO2: 24 mmol/L (ref 22–32)
Calcium: 8.7 mg/dL — ABNORMAL LOW (ref 8.9–10.3)
Chloride: 105 mmol/L (ref 98–111)
Creatinine, Ser: 0.88 mg/dL (ref 0.61–1.24)
GFR calc Af Amer: >60 mL/min (ref >60)
GFR calc non Af Amer: >60 mL/min (ref >60)
Glucose, Bld: 123 mg/dL — ABNORMAL HIGH (ref 70–99)
Potassium: 4.1 mmol/L (ref 3.5–5.1)
Sodium: 140 mmol/L (ref 135–145)

## 2019-06-16 LAB — MAGNESIUM
Magnesium: 2 mg/dL (ref 1.7–2.4)
Magnesium: 2.2 mg/dL (ref 1.7–2.4)

## 2019-06-16 LAB — TRIGLYCERIDES
Triglycerides: 124 mg/dL (ref <150)
Triglycerides: 132 mg/dL (ref <150)

## 2019-06-16 LAB — PHOSPHORUS
Phosphorus: 2.7 mg/dL (ref 2.5–4.6)
Phosphorus: 3 mg/dL (ref 2.5–4.6)

## 2019-06-16 LAB — PREALBUMIN: Prealbumin: 14.7 mg/dL — ABNORMAL LOW (ref 18–38)

## 2019-06-16 LAB — HEPARIN LEVEL (UNFRACTIONATED): Heparin Unfractionated: 0.19 IU/mL — ABNORMAL LOW (ref 0.30–0.70)

## 2019-06-16 LAB — BRAIN NATRIURETIC PEPTIDE: B Natriuretic Peptide: 74.1 pg/mL (ref 0.0–100.0)

## 2019-06-16 MED ORDER — TRAVASOL 10 % IV SOLN
INTRAVENOUS | Status: AC
  Administered 2019-06-16: 18:00:00 via INTRAVENOUS
  Filled 2019-06-16: qty 920.4

## 2019-06-16 MED ORDER — ENOXAPARIN SODIUM 120 MG/0.8ML ~~LOC~~ SOLN
120.0000 mg | SUBCUTANEOUS | Status: DC
  Administered 2019-06-16 – 2019-06-21 (×6): 120 mg via SUBCUTANEOUS
  Filled 2019-06-16 (×7): qty 0.8

## 2019-06-16 MED ORDER — SODIUM CHLORIDE 0.9 % IV SOLN
INTRAVENOUS | Status: DC
  Administered 2019-06-16 – 2019-06-20 (×2): via INTRAVENOUS

## 2019-06-16 NOTE — Progress Notes (Signed)
ANTICOAGULATION CONSULT NOTE - Follow Up Consult  Pharmacy Consult for Heparin>lovenox Indication: atrial fibrillation  Allergies  Allergen Reactions  . Felodipine Swelling    Legs became swollen  . Niacin Other (See Comments)    Made patient feel flushed    Patient Measurements: Height: 6\' 1"  (185.4 cm) Weight: 185 lb 13.6 oz (84.3 kg) IBW/kg (Calculated) : 79.9 Heparin Dosing Weight: TBW  Vital Signs: Temp: 97.8 F (36.6 C) (08/28 0829) Temp Source: Oral (08/28 0829) BP: 156/80 (08/28 0829) Pulse Rate: 55 (08/28 0829)  Labs: Recent Labs    06/14/19 0410 06/15/19 0300 06/15/19 1215 06/16/19 0550 06/16/19 0915 06/16/19 1100  HGB 13.3 12.5*  --  12.2*  --   --   HCT 39.8 38.9*  --  37.9*  --   --   PLT 308 298  --  292  --   --   APTT 80* 108*  --   --   --   --   HEPARINUNFRC 0.79* 0.54 0.58 0.19*  --   --   CREATININE 1.12 1.02  --   --  0.87 0.88    Estimated Creatinine Clearance: 93.3 mL/min (by C-G formula based on SCr of 0.88 mg/dL).   Medications:  Infusions:  . sodium chloride 10 mL/hr at 06/16/19 1303  . dextrose 5 % and 0.45 % NaCl with KCl 20 mEq/L 40 mL/hr at 06/16/19 0835  . TPN ADULT (ION) 35 mL/hr at 06/16/19 0547  . TPN ADULT (ION)      Assessment: 24 YOF with Afib and CHADSVASc score 3 on apixaban at home. Pharmacy consulted to transition to IV heparin. Apixaban has been discontinued and the last inpatient apixaban dose was 8/22 at 2100.  Heparin was started on 8/23 at  0930.  D/w Dr. Aileen Fass today and we will use lovenox instead of heparin. Hopefully, we can put him back on apixaban soon.   Goal of Therapy:  Anti-Xa 1-2 Monitor platelets by anticoagulation protocol: Yes   Plan:  Dc heparin Lovenox 1.5mg /kg/day - 120mg  SQ q24 F/u CBC  Onnie Boer, PharmD, BCIDP, AAHIVP, CPP Infectious Disease Pharmacist 06/16/2019 2:10 PM

## 2019-06-16 NOTE — Progress Notes (Signed)
[facility] PROGRESS NOTE    Progress Note  [name]  [id number] DOB: 04-Sep-1951 DOA: 06/05/2019 PCP: [name], FNP     Brief Narrative:   [name] is an 68 y.o. male past medical history significant for coronary artery disease, paroxysmal atrial fibrillation hypertension who has been feeling well until Thursday when he was sitting in his golf cart and suddenly started getting lightheaded.  As per witnesses he passed out and woke up immediately felt some chest tightness.  He describes as bricks sitting on his chest.  Work told him his heart rate was slow and fast but does not remember the rate, he took 2 nitroglycerin that relieved the pain.  In the ER he was found to have a COVID-19 infection and developed partial small bowel obstruction in-house  Assessment/Plan:   COVID-19 virus infection: Chest x-ray is clear, inflammatory markers are unremarkable no cough or shortness of breath.  Syncope likely due to orthostatic hypotension: In the setting of antihypertensive medication and diarrhea. Continue aggressive hydration telemetry showed no acute findings. Physical therapy is working with the patient recommended 24-hour supervision at home. Recheck orthostatic vitals.  Acute kidney injury: Likely prerenal azotemia resolved with IV fluid hydration.  Paroxismal atrial fibrillation: With a chads vas score of 3. Currently rate controlled continue Eliquis. Follow-up with cardiology as an outpatient.  Chest pressure resolved: Likely due to atrial fibrillation in the setting of orthostasis. Currently on aspirin and Eliquis and he is chest pain-free. Will need to follow-up with his cardiologist upon discharge for possible stress test.  COVID-19 induced gastroenteritis/SBO: Keep him n.p.o., continue nasogastric tube at intermittent suction. Continue to improve greater than 500 cc through his NG tube. KUB this morning unchanged from previous x-ray, avoid  narcotics, continue D5W with half-normal saline monitor electrolytes and replete as needed will allow ambulation with physical therapy. PICC line was placed, pharmacy was consulted to initiate TPN. He relates his abdominal pain is improved, he is passing gas this morning.  Nutrition status: The patient has been without nutrition now for more than 7 days, he was discussed with surgery, PICC line was placed and we will start nutrition per pharmacy protocol.  Essential hypertension: Continue to hold antihypertensive medication.   DVT prophylaxis: lovenxo Family Communication:none Disposition Plan/Barrier to D/C: unable to determine Code Status:     Code Status Orders  (From admission, onward)         Start     Ordered   06/05/19 2230  Full code  Continuous     06/05/19 2244        Code Status History    Date Active Date Inactive Code Status Order ID Comments User Context   05/30/2018 1902 05/31/2018 2154 Full Code [id number]  Skeet Latch, MD ED   12/02/2015 2227 12/03/2015 1852 Full Code [id number]  Erma Heritage, PA Inpatient   Advance Care Planning Activity        IV Access:    Peripheral IV   Procedures and diagnostic studies:   Dg Abd 1 View  Result Date: 06/15/2019 CLINICAL DATA:  Abdominal pain EXAM: ABDOMEN - 1 VIEW COMPARISON:  Yesterday FINDINGS: Gaseous distension of small and large bowel. The enteric tube tip is at the distal descending duodenum. No concerning mass effect or gas collection. IMPRESSION: 1. Unchanged gaseous bowel distension affecting small bowel more than colon. 2. Enteric tube with tip at the mid duodenum. Electronically Signed   By: Monte Fantasia M.D.   On:  06/15/2019 08:51   [place] Ekg Site Rite  Result Date: 06/15/2019 If Site Rite image not attached, placement could not be confirmed due to current cardiac rhythm.    Medical Consultants:    None.  Anti-Infectives:  None  Subjective:    [name] relates his abdominal  discomfort comfort is improved.  Objective:    Vitals:   06/15/19 1128 06/15/19 1642 06/15/19 1945 06/16/19 0440  BP:  (!) 161/84 (!) 149/81   Pulse:  (!) 56    Resp:  18    Temp:  (!) 97.4 F (36.3 C) 97.6 F (36.4 C) 98.1 F (36.7 C)  TempSrc:  Oral Oral Oral  SpO2:  96%    Weight: 84.3 kg     Height:       SpO2: 96 % O2 Flow Rate (L/min): 2 L/min   Intake/Output Summary (Last 24 hours) at 06/16/2019 0811 Last data filed at 06/16/2019 0547 Gross per 24 hour  Intake 1647.9 ml  Output 977 ml  Net 670.9 ml   Filed Weights   06/06/19 0032 06/12/19 0531 06/15/19 1128  Weight: 88 kg 89.2 kg 84.3 kg    Exam: General exam: In no acute distress. Respiratory system: Good air movement and clear to auscultation. Cardiovascular system: S1 & S2 heard, RRR. No JVD. Gastrointestinal system: Abdomen is soft mildly distended no rebound or guarding. Central nervous system: Alert and oriented. No focal neurological deficits. Extremities: No pedal edema. Skin: No rashes, lesions or ulcers Psychiatry: Judgement and insight appear normal. Mood & affect appropriate.    Data Reviewed:    Labs: Basic Metabolic Panel: Recent Labs  Lab 06/10/19 0130 06/11/19 0340 06/12/19 0605 06/13/19 0510 06/14/19 0410 06/15/19 0300  NA 140 139 139 139 139 140  K 4.0 4.0 3.8 3.8 3.8 3.8  CL 105 105 102 102 104 105  CO2 25 24 23 29 26 25   GLUCOSE 104* 86 89 124* 116* 85  BUN 15 14 13 19 20 16   CREATININE 1.01 1.01 1.06 1.11 1.12 1.02  CALCIUM 8.8* 8.6* 8.8* 8.7* 8.6* 8.5*  MG 2.1  --  2.0 2.4 2.3 2.3   GFR Estimated Creatinine Clearance: 80.5 mL/min (by C-G formula based on SCr of 1.02 mg/dL). Liver Function Tests: Recent Labs  Lab 06/10/19 0130 06/13/19 0510 06/14/19 0410 06/15/19 0300  AST 65* 37 48* 107*  ALT 84* 55* 57* 93*  ALKPHOS 58 89 92 114  BILITOT 1.3* 1.3* 1.4* 1.2  PROT 7.0 6.9 6.6 6.3*  ALBUMIN 3.2* 3.2* 3.2* 3.0*   No results for input(s): LIPASE, AMYLASE in  the last 168 hours. No results for input(s): AMMONIA in the last 168 hours. Coagulation profile No results for input(s): INR, PROTIME in the last 168 hours. COVID-19 Labs  No results for input(s): DDIMER, FERRITIN, LDH, CRP in the last 72 hours.  Lab Results  Component Value Date   SARSCOV2NAA POSITIVE (A) 06/05/2019    CBC: Recent Labs  Lab 06/10/19 0130 06/12/19 0605 06/13/19 0510 06/14/19 0410 06/15/19 0300 06/16/19 0550  WBC 5.2 2.0* 1.9* 3.6* 3.8* 3.5*  NEUTROABS 3.9  --  1.0* 2.1 2.2 2.0  HGB 13.6 13.5 13.1 13.3 12.5* 12.2*  HCT 40.1 39.8 39.1 39.8 38.9* 37.9*  MCV 89.3 90.0 89.1 90.2 92.0 95.2  PLT 166 225 272 308 298 292   Cardiac Enzymes: No results for input(s): CKTOTAL, CKMB, CKMBINDEX, TROPONINI in the last 168 hours. BNP (last 3 results) No results for input(s): PROBNP in  the last 8760 hours. CBG: Recent Labs  Lab 06/15/19 1138 06/15/19 1645 06/15/19 2113 06/16/19 0336 06/16/19 0753  GLUCAP 105* 87 128* 115* 134*   D-Dimer: No results for input(s): DDIMER in the last 72 hours. Hgb A1c: No results for input(s): HGBA1C in the last 72 hours. Lipid Profile: No results for input(s): CHOL, HDL, LDLCALC, TRIG, CHOLHDL, LDLDIRECT in the last 72 hours. Thyroid function studies: No results for input(s): TSH, T4TOTAL, T3FREE, THYROIDAB in the last 72 hours.  Invalid input(s): FREET3 Anemia work up: No results for input(s): VITAMINB12, FOLATE, FERRITIN, TIBC, IRON, RETICCTPCT in the last 72 hours. Sepsis Labs: Recent Labs  Lab 06/13/19 0510 06/14/19 0410 06/15/19 0300 06/16/19 0550  WBC 1.9* 3.6* 3.8* 3.5*   Microbiology Recent Results (from the past 240 hour(s))  MRSA PCR Screening     Status: None   Collection Time: 06/11/19  2:17 PM  Result Value Ref Range Status   MRSA by PCR NEGATIVE NEGATIVE Final    Comment:        The GeneXpert MRSA Assay (FDA approved for NASAL specimens only), is one component of a comprehensive MRSA colonization  surveillance program. It is not intended to diagnose MRSA infection nor to guide or monitor treatment for MRSA infections. Performed at [facility], [place] [address] [place], [place] [address]      Medications:   . aspirin EC  81 mg Oral QHS  . Chlorhexidine Gluconate Cloth  6 each Topical Daily  . finasteride  5 mg Oral Daily  . insulin aspart  0-9 Units Subcutaneous Q4H  . ketorolac  7.5 mg Intravenous Q8H  . pantoprazole (PROTONIX) IV  40 mg Intravenous Q24H  . rosuvastatin  40 mg Oral Daily  . sodium chloride flush  10-40 mL Intracatheter Q12H   Continuous Infusions: . dextrose 5 % and 0.45 % NaCl with KCl 20 mEq/L 40 mL/hr at 06/16/19 0547  . heparin 1,500 Units/hr (06/16/19 0547)  . TPN ADULT (ION) 35 mL/hr at 06/16/19 0547     LOS: 10 days   Charlynne Cousins  [facility]  06/16/2019, 8:11 AM

## 2019-06-16 NOTE — Progress Notes (Signed)
Occupational Therapy Treatment Patient Details Name: [name] MRN: [id number] DOB: [birth year] Today's Date: 06/16/2019    History of present illness 68 y.o. male admitted on 06/05/19 for chest pain and syncopal episodes.  Incidently dx with COVID 19.  He has struggled acutely with lightheadedness and diarrhea.  Dx with dehydration and AKI, PAF, chest pressure, mild COVID 19 transaminitis. Devleoped ileus with NG tube placed. Pt with other significant PMH of MI, HTN, CAD, R CEA, bradycardia, A-fib, R knee arthroscopy, coronary angio with stent and multiple heart caths.    OT comments  Pt progressing towards OT goals this session, orthostatic readings are as stated below (Pt not-orthostatic, BP consistent throughout session - slightly elevated). Pt very motivated to walk today, performing transfers at min guard utilizing IV pole for support and balance. Pt slightly impaired for grooming tasks hindered by NG tube. Able to ambulate in hallway today and educated on importance of activity/mobility as well as sitting upright to get gut going. Pt left in chair NG tube re-attached.   Follow Up Recommendations  Supervision/Assistance - 24 hour    Equipment Recommendations  3 in 1 bedside commode    Recommendations for Other Services PT consult    Precautions / Restrictions Precautions Precautions: Other (comment) Precaution Comments: syncope, orthostasis, NG tube Restrictions Weight Bearing Restrictions: No       Mobility Bed Mobility Overal bed mobility: Modified Independent             General bed mobility comments: increased time  Transfers Overall transfer level: Needs assistance Equipment used: None Transfers: Sit to/from Stand Sit to Stand: Min guard         General transfer comment: a little effort for patient to rise from bed    Balance Overall balance assessment: Needs assistance Sitting-balance support: Feet supported;No upper extremity supported Sitting  balance-Leahy Scale: Good     Standing balance support: No upper extremity supported Standing balance-Leahy Scale: Fair Standing balance comment: slightly unsteady with no UE support                           ADL either performed or assessed with clinical judgement   ADL Overall ADL's : Needs assistance/impaired                         Toilet Transfer: Min guard;Ambulation Toilet Transfer Details (indicate cue type and reason): pushing IV pole Toileting- Clothing Manipulation and Hygiene: Min guard;Sit to/from stand       Functional mobility during ADLs: Min guard(pushing IV pole)       Vision       Perception     Praxis      Cognition Arousal/Alertness: Awake/alert Behavior During Therapy: WFL for tasks assessed/performed Overall Cognitive Status: Within Functional Limits for tasks assessed                                          Exercises     Shoulder Instructions       General Comments educated on importance of sitting up in chair and mobility to assist with getting gut moving    Pertinent Vitals/ Pain       Pain Assessment: Faces Faces Pain Scale: Hurts little more Pain Location: nose w/ tube Pain Descriptors / Indicators: Discomfort;Sharp Pain Intervention(s): Monitored during session;Repositioned  Home Living                                          Prior Functioning/Environment              Frequency  Min 3X/week        Progress Toward Goals  OT Goals(current goals can now be found in the care plan section)  Progress towards OT goals: Progressing toward goals  Acute Rehab OT Goals Patient Stated Goal: to feel better, [place] OT Goal Formulation: With patient Time For Goal Achievement: 06/30/19 Potential to Achieve Goals: Good  Plan Discharge plan remains appropriate    Co-evaluation    PT/OT/SLP Co-Evaluation/Treatment: Yes Reason  for Co-Treatment: For patient/therapist safety PT goals addressed during session: Mobility/safety with mobility;Balance;Proper use of DME OT goals addressed during session: ADL's and self-care      AM-PAC OT "6 Clicks" Daily Activity     Outcome Measure   Help from another person eating meals?: None Help from another person taking care of personal grooming?: A Little Help from another person toileting, which includes using toliet, bedpan, or urinal?: A Little Help from another person bathing (including washing, rinsing, drying)?: None Help from another person to put on and taking off regular upper body clothing?: None Help from another person to put on and taking off regular lower body clothing?: A Little 6 Click Score: 21    End of Session    OT Visit Diagnosis: Unsteadiness on feet (R26.81);Muscle weakness (generalized) (M62.81)   Activity Tolerance Patient tolerated treatment well   Patient Left in chair;with call bell/phone within reach;Other (comment)(NG tube hooked back up)   Nurse Communication Mobility status(NG tube hooked back up)        Time: [id number] OT Time Calculation (min): 26 min  Charges: OT General Charges $OT Visit: 1 Visit OT Treatments $Therapeutic Activity: 8-22 mins  Hulda Humphrey OTR/L Acute Rehabilitation Services Pager: [phone] Office: [place] 06/16/2019, 6:00 PM

## 2019-06-16 NOTE — Progress Notes (Signed)
PHARMACY - ADULT TOTAL PARENTERAL NUTRITION CONSULT NOTE  Pharmacy Consult:  TPN Indication: Prolonged Ileus   Patient Measurements: Height: 6' 1" (185.4 cm) Weight: 185 lb 13.6 oz (84.3 kg) IBW/kg (Calculated) : 79.9 TPN AdjBW (KG): 89.2 Body mass index is 24.52 kg/m. Usual Weight: ~200lb  Assessment:  44 YOM presented on 06/02/19 with with syncope and CP.  Found to be COVID+ and treatment of diarrhea led to ileus now has developed pSBO while admitted, currently with NGT on suction.  Has been passing gas and had multiple BM on 8/26.  Mostly NPO since 8/23.   GI: BL prealbumin slightly low at 14.7, NG O/P 55m, LBM 8/27.  PPI IV Endo: no hx DM, A1c 6.2%.  CBGs controlled Insulin requirements in the past 24 hours: 1 unit since TPN started Lytes: first set of labs was contaminated - all WNL Renal: SCr 0.88, BUN WNL - UOP 0.2 ml/kg/hr, D51/2NS 20K at 40/hr, finasteride  Pulm: stable on RA Cards: CAD/PAF/HTN - BP borderline high, brady 40-50s.  ASA, Crestor AC: Heparin for Afib - mild anemia, plts WNL Hepatobil: AST/ALT mildly elevated, alk phos / tbili / TG WNL Neuro: Toradol x5 days, APAP.  Pain score 3-6. ID: COVID+, afebrile, WBC 3.5 TPN Access: PICC planned 8/27 TPN start date: 06/15/19  Nutritional Goals (per RD rec 8/27): 2205-2470 kCal, 126-152gm protein per day  Current Nutrition:  TPN Clear liquid diet - none charted  Plan:  Increase TPN to 65 ml/hr (goal rate 95 ml/hr) TPN will provide 92g AA, 218g CHO and 47g ILE for a total of 1579 kCal, meeting ~70% of patient's needs Electrolytes in TPN: Cl:Ac 1:1 - increase all lytes with increased TPN rate Daily multivitamin in TPN.  Trace elements MWF d/t shortage. Continue sensitive SSI Q4H D/C D51/2NS 20K when next TPN bag starts.  [place] F/U AM labs, PO intake/diet advancement to decide on TPN rate   Shelena Castelluccio D. DMina Marble PharmD, BCPS, BGreat [place]8/28/2020, 11:46 AM

## 2019-06-16 NOTE — Progress Notes (Signed)
ANTICOAGULATION CONSULT NOTE - Follow Up Consult  Pharmacy Consult for Heparin Indication: atrial fibrillation  Allergies  Allergen Reactions  . Felodipine Swelling    Legs became swollen  . Niacin Other (See Comments)    Made patient feel flushed    Patient Measurements: Height: 6\' 1"  (185.4 cm) Weight: 185 lb 13.6 oz (84.3 kg) IBW/kg (Calculated) : 79.9 Heparin Dosing Weight: TBW  Vital Signs: Temp: 98.1 F (36.7 C) (08/28 0440) Temp Source: Oral (08/28 0440) BP: 149/81 (08/27 1945)  Labs: Recent Labs    06/13/19 1045  06/14/19 0410 06/15/19 0300 06/15/19 1215 06/16/19 0550  HGB  --    < > 13.3 12.5*  --  12.2*  HCT  --   --  39.8 38.9*  --  37.9*  PLT  --   --  308 298  --  292  APTT 62*  --  80* 108*  --   --   HEPARINUNFRC  --    < > 0.79* 0.54 0.58 0.19*  CREATININE  --   --  1.12 1.02  --   --    < > = values in this interval not displayed.    Estimated Creatinine Clearance: 80.5 mL/min (by C-G formula based on SCr of 1.02 mg/dL).   Medications:  Infusions:  . dextrose 5 % and 0.45 % NaCl with KCl 20 mEq/L 40 mL/hr at 06/16/19 0547  . heparin 1,500 Units/hr (06/16/19 0547)  . TPN ADULT (ION) 35 mL/hr at 06/16/19 0547    Assessment: 49 YOF with Afib and CHADSVASc score 3 on apixaban at home. Pharmacy consulted to transition to IV heparin. Apixaban has been discontinued and the last inpatient apixaban dose was 8/22 at 2100.  Heparin was started on 8/23 at  0930.  Heparin level down to 0.19 this morning with no issues reported or documented. Hgb and plts ok.  Goal of Therapy:  Heparin level 0.3-0.7 units/ml aPTT 66-102 seconds Monitor platelets by anticoagulation protocol: Yes   Plan:  Increase heparin IV infusion to 1,650 units/hr Recheck heparin level at 1300 today Monitor daily heparin level, CBC, s/s of bleed Follow up plans from general surgery regarding GI function, enteral access.  Elenor Quinones, PharmD, BCPS, BCIDP Clinical  Pharmacist 06/16/2019 6:36 AM

## 2019-06-16 NOTE — Progress Notes (Signed)
Physical Therapy Treatment Patient Details Name: [name] MRN: [id number] DOB: June 26, 1951 Today's Date: 06/16/2019    History of Present Illness 68 y.o. male admitted on 06/05/19 for chest pain and syncopal episodes.  Incidently dx with COVID 19.  He has struggled acutely with lightheadedness and diarrhea.  Dx with dehydration and AKI, PAF, chest pressure, mild COVID 19 transaminitis. Devleoped ileus with NG tube placed. Pt with other significant PMH of MI, HTN, CAD, R CEA, bradycardia, A-fib, R knee arthroscopy, coronary angio with stent and multiple heart caths.     PT Comments    Patient ambulated x 120', initially dizzy. BP stable pre and post wa;l. Patient reports feeling better but weak. Continue mobility as much as possible.   Follow Up Recommendations  No PT follow up;Supervision for mobility/OOB     Equipment Recommendations  None recommended by PT    Recommendations for Other Services       Precautions / Restrictions Precautions Precaution Comments: syncope, orthostasis, NG tube    Mobility  Bed Mobility Overal bed mobility: Independent                Transfers Overall transfer level: Needs assistance Equipment used: None Transfers: Sit to/from Stand Sit to Stand: Min guard         General transfer comment: a little effort for patient to rise from bed  Ambulation/Gait Ambulation/Gait assistance: Min assist;+2 safety/equipment(followed with recliner for safety) Gait Distance (Feet): 120 Feet Assistive device: IV Pole Gait Pattern/deviations: Step-through pattern;Shuffle;Decreased stride length Gait velocity: decreased   General Gait Details: pt reports feeling a little dizzy but pressed on to ambulate   Stairs             Wheelchair Mobility    Modified Rankin (Stroke Patients Only)       Balance Overall balance assessment: Needs assistance   Sitting balance-Leahy Scale: Good     Standing balance support: No upper extremity  supported Standing balance-Leahy Scale: Fair Standing balance comment: slightly unsteady with no UE support                            Cognition Arousal/Alertness: Awake/alert                                            Exercises      General Comments        Pertinent Vitals/Pain Faces Pain Scale: Hurts little more Pain Location: nose w/ tube Pain Descriptors / Indicators: Discomfort;Sharp Pain Intervention(s): Monitored during session;Repositioned    Home Living                      Prior Function            PT Goals (current goals can now be found in the care plan section) Progress towards PT goals: Progressing toward goals    Frequency    Min 3X/week      PT Plan Current plan remains appropriate    Co-evaluation PT/OT/SLP Co-Evaluation/Treatment: Yes Reason for Co-Treatment: For patient/therapist safety PT goals addressed during session: Mobility/safety with mobility        AM-PAC PT "6 Clicks" Mobility   Outcome Measure  Help needed turning from your back to your side while in a flat bed without using bedrails?: None Help needed moving from lying  on your back to sitting on the side of a flat bed without using bedrails?: None Help needed moving to and from a bed to a chair (including a wheelchair)?: A Little Help needed standing up from a chair using your arms (e.g., wheelchair or bedside chair)?: A Little Help needed to walk in hospital room?: A Little Help needed climbing 3-5 steps with a railing? : A Little 6 Click Score: 20    End of Session   Activity Tolerance: Patient tolerated treatment well Patient left: in chair;with call bell/phone within reach Nurse Communication: Mobility status PT Visit Diagnosis: Difficulty in walking, not elsewhere classified (R26.2)     Time: [id number] PT Time Calculation (min) (ACUTE ONLY): 21 min  Charges:  $Gait Training: 8-22 mins                     [place]  Office [phone]     Claretha Cooper 06/16/2019, 1:44 PM

## 2019-06-17 ENCOUNTER — Inpatient Hospital Stay (HOSPITAL_COMMUNITY): Payer: Medicare Other

## 2019-06-17 LAB — BASIC METABOLIC PANEL
Anion gap: 9 (ref 5–15)
BUN: 18 mg/dL (ref 8–23)
CO2: 23 mmol/L (ref 22–32)
Calcium: 8.6 mg/dL — ABNORMAL LOW (ref 8.9–10.3)
Chloride: 108 mmol/L (ref 98–111)
Creatinine, Ser: 0.86 mg/dL (ref 0.61–1.24)
GFR calc Af Amer: >60 mL/min (ref >60)
GFR calc non Af Amer: >60 mL/min (ref >60)
Glucose, Bld: 113 mg/dL — ABNORMAL HIGH (ref 70–99)
Potassium: 4.2 mmol/L (ref 3.5–5.1)
Sodium: 140 mmol/L (ref 135–145)

## 2019-06-17 LAB — CBC
HCT: 39.2 % (ref 39.0–52.0)
Hemoglobin: 12.8 g/dL — ABNORMAL LOW (ref 13.0–17.0)
MCH: 29.6 pg (ref 26.0–34.0)
MCHC: 32.7 g/dL (ref 30.0–36.0)
MCV: 90.7 fL (ref 80.0–100.0)
Platelets: 289 10*3/uL (ref 150–400)
RBC: 4.32 MIL/uL (ref 4.22–5.81)
RDW: 12 % (ref 11.5–15.5)
WBC: 3.9 10*3/uL — ABNORMAL LOW (ref 4.0–10.5)
nRBC: 0 % (ref 0.0–0.2)

## 2019-06-17 LAB — MAGNESIUM: Magnesium: 2.1 mg/dL (ref 1.7–2.4)

## 2019-06-17 LAB — GLUCOSE, CAPILLARY
Glucose-Capillary: 107 mg/dL — ABNORMAL HIGH (ref 70–99)
Glucose-Capillary: 109 mg/dL — ABNORMAL HIGH (ref 70–99)
Glucose-Capillary: 122 mg/dL — ABNORMAL HIGH (ref 70–99)
Glucose-Capillary: 112 mg/dL — ABNORMAL HIGH (ref 70–99)
Glucose-Capillary: 115 mg/dL — ABNORMAL HIGH (ref 70–99)

## 2019-06-17 LAB — PHOSPHORUS: Phosphorus: 3.3 mg/dL (ref 2.5–4.6)

## 2019-06-17 MED ORDER — TRAVASOL 10 % IV SOLN
INTRAVENOUS | Status: AC
  Administered 2019-06-17: 18:00:00 via INTRAVENOUS
  Filled 2019-06-17: qty 1345.2

## 2019-06-17 NOTE — Progress Notes (Signed)
[facility] PROGRESS NOTE    Progress Note  [name]  [id number] DOB: 1950-12-07 DOA: 06/05/2019 PCP: [name], FNP     Brief Narrative:   [name] is an 68 y.o. male past medical history significant for coronary artery disease, paroxysmal atrial fibrillation hypertension who has been feeling well until Thursday when he was sitting in his golf cart and suddenly started getting lightheaded.  As per witnesses he passed out and woke up immediately felt some chest tightness.  He describes as bricks sitting on his chest.  Work told him his heart rate was slow and fast but does not remember the rate, he took 2 nitroglycerin that relieved the pain.  In the ER he was found to have a COVID-19 infection and developed partial small bowel obstruction in-house  Assessment/Plan:   COVID-19 virus infection: Chest x-ray is clear, inflammatory markers are unremarkable no cough or shortness of breath.  Syncope likely due to orthostatic hypotension: In the setting of antihypertensive medication and diarrhea. Physical therapy is working with the patient recommended 24-hour supervision at home. Orthostatic vitals have resolved.  Acute kidney injury: Likely prerenal azotemia resolved with IV fluid hydration.  Paroxismal atrial fibrillation: With a chads vas score of 3. Currently rate controlled, continue IV heparin. Follow-up with cardiology as an outpatient.  Chest pressure resolved: Likely due to atrial fibrillation in the setting of orthostasis. Currently on aspirin and Eliquis and he is chest pain-free. Will need to follow-up with his cardiologist upon discharge for possible stress test.  COVID-19 induced gastroenteritis/SBO: Continue to keep him n.p.o., nasogastric tube to intermittent suction. He is putting about 800 cc through his NG tube. KUB this morning have personally reviewed and looks improved compared to yesterday, he relates he has had about 8 bowel  movements in the last 24 hours that are watery. Monitor electrolytes closely replete as needed, avoid narcotics continue ambulation. PICC line inserted on 06/15/2019, continue TPN per pharmacy protocol.  Nutrition status: The patient has been without nutrition now for more than 7 days, he was discussed with surgery, PICC line was placed and we will start nutrition per pharmacy protocol. ` Essential hypertension: Continue to hold antihypertensive medication.   DVT prophylaxis: lovenxo Family Communication:none Disposition Plan/Barrier to D/C: Once tolerating a diet. Code Status:     Code Status Orders  (From admission, onward)         Start     Ordered   06/05/19 2230  Full code  Continuous     06/05/19 2244        Code Status History    Date Active Date Inactive Code Status Order ID Comments User Context   05/30/2018 1902 05/31/2018 2154 Full Code [id number]  Skeet Latch, MD ED   12/02/2015 2227 12/03/2015 1852 Full Code [id number]  Erma Heritage, PA Inpatient   Advance Care Planning Activity        IV Access:    Peripheral IV   Procedures and diagnostic studies:   Dg Abd Portable 1v  Result Date: 06/16/2019 CLINICAL DATA:  Ileus EXAM: PORTABLE ABDOMEN - 1 VIEW COMPARISON:  06/15/2019 FINDINGS: Diffuse small bowel dilatation unchanged. Nondilated gas-filled colon unchanged. NG tube passes through the stomach with the tip in the second portion of the duodenum, unchanged. IMPRESSION: Moderate small bowel dilatation unchanged. Possible small bowel obstruction versus ileus. NG in the duodenum. Electronically Signed   By: Franchot Gallo M.D.   On: 06/16/2019 08:46   [employer]  Result Date: 06/15/2019 If [facility] image not attached, placement could not be confirmed due to current cardiac rhythm.    Medical Consultants:    None.  Anti-Infectives:  None  Subjective:    [name] he relates he has had about 8 bowel movements in the last 24  hours, his abdominal discomfort has significantly improved.  Objective:    Vitals:   06/16/19 0829 06/16/19 1610 06/16/19 2000 06/17/19 0400  BP: (!) 156/80 (!) 154/73 (!) 157/79 (!) 158/84  Pulse: (!) 55 87    Resp: 18 18    Temp: 97.8 F (36.6 C) (!) 97.4 F (36.3 C) (!) 97.5 F (36.4 C) 97.6 F (36.4 C)  TempSrc: Oral Oral Oral Oral  SpO2: 96% 97%    Weight:      Height:       SpO2: 97 % O2 Flow Rate (L/min): 2 L/min   Intake/Output Summary (Last 24 hours) at 06/17/2019 0820 Last data filed at 06/17/2019 0600 Gross per 24 hour  Intake 982.75 ml  Output 1600 ml  Net -617.25 ml   Filed Weights   06/06/19 0032 06/12/19 0531 06/15/19 1128  Weight: 88 kg 89.2 kg 84.3 kg    Exam: General exam: In no acute distress. Respiratory system: Good air movement and clear to auscultation. Cardiovascular system: S1 & S2 heard, RRR.  No JVD. Gastrointestinal system: His abdomen is less distended than yesterday, still tympanic positive bowel sounds soft no pain to light or deep palpation. Central nervous system: Alert and oriented. No focal neurological deficits. Extremities: No lower extremity edema. Skin: No rashes, lesions or ulcers Psychiatry: Judgement and insight appear normal. Mood & affect appropriate.    Data Reviewed:    Labs: Basic Metabolic Panel: Recent Labs  Lab 06/14/19 0410 06/15/19 0300 06/16/19 0915 06/16/19 1100 06/17/19 0600  NA 139 140 136 140 140  K 3.8 3.8 4.7 4.1 4.2  CL 104 105 105 105 108  CO2 26 25 25 24 23   GLUCOSE 116* 85 301* 123* 113*  BUN 20 16 16 16 18   CREATININE 1.12 1.02 0.87 0.88 0.86  CALCIUM 8.6* 8.5* 8.0* 8.7* 8.6*  MG 2.3 2.3 2.0 2.2 2.1  PHOS  --   --  2.7 3.0 3.3   GFR Estimated Creatinine Clearance: 95.5 mL/min (by C-G formula based on SCr of 0.86 mg/dL). Liver Function Tests: Recent Labs  Lab 06/13/19 0510 06/14/19 0410 06/15/19 0300 06/16/19 0915  AST 37 48* 107* 84*  ALT 55* 57* 93* 107*  ALKPHOS 89 92 114  123  BILITOT 1.3* 1.4* 1.2 1.0  PROT 6.9 6.6 6.3* 6.2*  ALBUMIN 3.2* 3.2* 3.0* 2.7*   No results for input(s): LIPASE, AMYLASE in the last 168 hours. No results for input(s): AMMONIA in the last 168 hours. Coagulation profile No results for input(s): INR, PROTIME in the last 168 hours. COVID-19 Labs  No results for input(s): DDIMER, FERRITIN, LDH, CRP in the last 72 hours.  Lab Results  Component Value Date   SARSCOV2NAA POSITIVE (A) 06/05/2019    CBC: Recent Labs  Lab 06/13/19 0510 06/14/19 0410 06/15/19 0300 06/16/19 0550 06/17/19 0600  WBC 1.9* 3.6* 3.8* 3.5* 3.9*  NEUTROABS 1.0* 2.1 2.2 2.0  --   HGB 13.1 13.3 12.5* 12.2* 12.8*  HCT 39.1 39.8 38.9* 37.9* 39.2  MCV 89.1 90.2 92.0 95.2 90.7  PLT 272 308 298 292 289   Cardiac Enzymes: No results for input(s): CKTOTAL, CKMB, CKMBINDEX, TROPONINI in the last  168 hours. BNP (last 3 results) No results for input(s): PROBNP in the last 8760 hours. CBG: Recent Labs  Lab 06/16/19 1143 06/16/19 1610 06/16/19 2021 06/16/19 2331 06/17/19 0406  GLUCAP 120* 111* 121* 118* 107*   D-Dimer: No results for input(s): DDIMER in the last 72 hours. Hgb A1c: No results for input(s): HGBA1C in the last 72 hours. Lipid Profile: Recent Labs    06/16/19 0915 06/16/19 1100  TRIG 124 132   Thyroid function studies: No results for input(s): TSH, T4TOTAL, T3FREE, THYROIDAB in the last 72 hours.  Invalid input(s): FREET3 Anemia work up: No results for input(s): VITAMINB12, FOLATE, FERRITIN, TIBC, IRON, RETICCTPCT in the last 72 hours. Sepsis Labs: Recent Labs  Lab 06/14/19 0410 06/15/19 0300 06/16/19 0550 06/17/19 0600  WBC 3.6* 3.8* 3.5* 3.9*   Microbiology Recent Results (from the past 240 hour(s))  MRSA PCR Screening     Status: None   Collection Time: 06/11/19  2:17 PM  Result Value Ref Range Status   MRSA by PCR NEGATIVE NEGATIVE Final    Comment:        The GeneXpert MRSA Assay (FDA approved for NASAL  specimens only), is one component of a comprehensive MRSA colonization surveillance program. It is not intended to diagnose MRSA infection nor to guide or monitor treatment for MRSA infections. Performed at [facility], [place] [address] [place], [place] [address]      Medications:   . aspirin EC  81 mg Oral QHS  . Chlorhexidine Gluconate Cloth  6 each Topical Daily  . enoxaparin (LOVENOX) injection  120 mg Subcutaneous Q24H  . finasteride  5 mg Oral Daily  . insulin aspart  0-9 Units Subcutaneous Q4H  . pantoprazole (PROTONIX) IV  40 mg Intravenous Q24H  . rosuvastatin  40 mg Oral Daily  . sodium chloride flush  10-40 mL Intracatheter Q12H   Continuous Infusions: . sodium chloride 10 mL/hr at 06/16/19 1303  . TPN ADULT (ION) 65 mL/hr at 06/16/19 1730     LOS: 11 days   Charlynne Cousins  [facility]  06/17/2019, 8:20 AM

## 2019-06-17 NOTE — Progress Notes (Signed)
PHARMACY - ADULT TOTAL PARENTERAL NUTRITION CONSULT NOTE  Pharmacy Consult:  TPN Indication: Prolonged Ileus   Patient Measurements: Height: _0  (185.4 cm) Weight: 185 lb 13.6 oz (84.3 kg) IBW/kg (Calculated) : 79.9 TPN AdjBW (KG): 89.2 Body mass index is 24.52 kg/m. Usual Weight: ~200lb  Assessment:  42 YOM presented on 06/02/19 with with syncope and CP.  Found to be COVID+ and treatment of diarrhea led to ileus now has developed pSBO while admitted, currently with NGT on suction.  Has been passing gas and had multiple BM on 8/26.  Mostly NPO since 8/23.   GI: BL prealbumin slightly low at 14.7, NG O/P 550>561m, LBM 8/27.  PPI IV Endo: no hx DM, A1c 6.2%.  CBGs 107-121 Insulin requirements in the past 24 hours: 0 units Lytes: Ca slightly low, phos from 3>3.3, others wnl Renal: SCr 0.88, BUN WNL - UOP 0.2>0.4 ml/kg/hr, finasteride  Pulm: stable on RA Cards: CAD/PAF/HTN - SBP 150s, HR improved  ASA, Crestor AC: Lovenox for Afib - mild anemia, plts WNL Hepatobil: AST/ALT mildly elevated, alk phos / tbili / TG WNL Neuro: Toradol x5 days, APAP.  Pain score 3-6. ID: COVID+, afebrile, WBC 3.9 TPN Access: PICC placed 8/27 TPN start date: 06/15/19  Nutritional Goals (per RD rec 8/27): 2205-2470 kCal, 126-152gm protein per day  Current Nutrition:  TPN NPO  Plan:  Increase TPN to goal at 95 ml/hr  TPN will provide 134g AA, 319g CHO and 68g ILE for a total of 2307 kCal, meeting 100% of patient's needs Electrolytes in TPN: Cl:Ac 1:1 - Increase all lytes with increased TPN rate Daily multivitamin in TPN.  Trace elements MWF d/t shortage. Continue sensitive SSI Q4H - consider d/c if continues to be controlled once at [facility]   JBertis Ruddy PharmD Clinical Pharmacist Please check AMION for all MK[facility]umbers 06/17/2019 7:16 AM

## 2019-06-18 ENCOUNTER — Inpatient Hospital Stay (HOSPITAL_COMMUNITY): Payer: Medicare Other

## 2019-06-18 LAB — CBC
HCT: 39.1 % (ref 39.0–52.0)
Hemoglobin: 12.9 g/dL — ABNORMAL LOW (ref 13.0–17.0)
MCH: 29.9 pg (ref 26.0–34.0)
MCHC: 33 g/dL (ref 30.0–36.0)
MCV: 90.5 fL (ref 80.0–100.0)
Platelets: 271 10*3/uL (ref 150–400)
RBC: 4.32 MIL/uL (ref 4.22–5.81)
RDW: 12 % (ref 11.5–15.5)
WBC: 4.6 10*3/uL (ref 4.0–10.5)
nRBC: 0 % (ref 0.0–0.2)

## 2019-06-18 LAB — BASIC METABOLIC PANEL
Anion gap: 7 (ref 5–15)
BUN: 19 mg/dL (ref 8–23)
CO2: 25 mmol/L (ref 22–32)
Calcium: 8.9 mg/dL (ref 8.9–10.3)
Chloride: 107 mmol/L (ref 98–111)
Creatinine, Ser: 0.78 mg/dL (ref 0.61–1.24)
GFR calc Af Amer: >60 mL/min (ref >60)
GFR calc non Af Amer: >60 mL/min (ref >60)
Glucose, Bld: 100 mg/dL — ABNORMAL HIGH (ref 70–99)
Potassium: 4.4 mmol/L (ref 3.5–5.1)
Sodium: 139 mmol/L (ref 135–145)

## 2019-06-18 LAB — GLUCOSE, CAPILLARY
Glucose-Capillary: 106 mg/dL — ABNORMAL HIGH (ref 70–99)
Glucose-Capillary: 108 mg/dL — ABNORMAL HIGH (ref 70–99)
Glucose-Capillary: 113 mg/dL — ABNORMAL HIGH (ref 70–99)
Glucose-Capillary: 115 mg/dL — ABNORMAL HIGH (ref 70–99)
Glucose-Capillary: 117 mg/dL — ABNORMAL HIGH (ref 70–99)
Glucose-Capillary: 119 mg/dL — ABNORMAL HIGH (ref 70–99)
Glucose-Capillary: 121 mg/dL — ABNORMAL HIGH (ref 70–99)

## 2019-06-18 MED ORDER — TRAVASOL 10 % IV SOLN
INTRAVENOUS | Status: AC
  Administered 2019-06-18: 17:00:00 via INTRAVENOUS
  Filled 2019-06-18: qty 1345.2

## 2019-06-18 NOTE — Progress Notes (Signed)
[facility] PROGRESS NOTE    Progress Note  [name]  [id number] DOB: 10-20-1950 DOA: 06/05/2019 PCP: [name], FNP     Brief Narrative:   NAI [name] is an 68 y.o. male past medical history significant for coronary artery disease, paroxysmal atrial fibrillation hypertension who has been feeling well until Thursday when he was sitting in his golf cart and suddenly started getting lightheaded.  As per witnesses he passed out and woke up immediately felt some chest tightness.  He describes as bricks sitting on his chest.  Work told him his heart rate was slow and fast but does not remember the rate, he took 2 nitroglycerin that relieved the pain.  In the ER he was found to have a COVID-19 infection and developed partial small bowel obstruction in-house  Assessment/Plan:   COVID-19 virus infection: Chest x-ray is clear, inflammatory markers are unremarkable no cough or shortness of breath.  Syncope likely due to orthostatic hypotension: In the setting of antihypertensive medication and diarrhea. Physical therapy is working with the patient recommended 24-hour supervision at home. Orthostatic vitals have resolved.  Acute kidney injury: Likely prerenal azotemia resolved with IV fluid hydration.  Paroxismal atrial fibrillation: With a chads vas score of 3. Currently rate controlled, continue IV heparin. Follow-up with cardiology as an outpatient.  Chest pressure resolved: Likely due to atrial fibrillation in the setting of orthostasis. Currently on aspirin and Eliquis and he is chest pain-free. Will need to follow-up with his cardiologist upon discharge for possible stress test.  COVID-19 induced gastroenteritis/SBO: Keep the patient in p.o., continue nasogastric tube to intermittent suction. He is putting about 800 cc through his NG tube, he has had 3 watery bowel movements this morning. His abdominal x-ray looks unchanged compared to yesterday, he does  relate his abdominal pain is resolved. Continue to monitor electrolytes and replete as needed, avoid narcotics, will get from bed to chair and continue to work with physical therapy.  Nutrition status: The patient has been without nutrition now for more than 7 days, he was discussed with surgery, PICC line was placed and we will start nutrition per pharmacy protocol.  Essential hypertension: Continue to hold antihypertensive medication.   DVT prophylaxis: lovenxo Family Communication:none Disposition Plan/Barrier to D/C: Once tolerating a diet. Code Status:     Code Status Orders  (From admission, onward)         Start     Ordered   06/05/19 2230  Full code  Continuous     06/05/19 2244        Code Status History    Date Active Date Inactive Code Status Order ID Comments User Context   05/30/2018 1902 05/31/2018 2154 Full Code [id number]  Skeet Latch, MD ED   12/02/2015 2227 12/03/2015 1852 Full Code [id number]  Erma Heritage, PA Inpatient   Advance Care Planning Activity        IV Access:    Peripheral IV   Procedures and diagnostic studies:   Dg Abd 1 View  Result Date: 06/17/2019 CLINICAL DATA:  Abdominal pain. EXAM: ABDOMEN - 1 VIEW COMPARISON:  06/16/2019 FINDINGS: Nasogastric terminates at the descending duodenum. Gas within normal caliber colon. Gas-filled small bowel loops measure up to 4.4 cm today versus up to 4.7 cm on the prior. No gross free intraperitoneal air. IMPRESSION: Slight improvement in small bowel distension, favoring adynamic ileus. Electronically Signed   By: Abigail Miyamoto M.D.   On: 06/17/2019 10:53  Medical Consultants:    None.  Anti-Infectives:  None  Subjective:    [name] relates he has had about 3 bowel movements this morning.  Objective:    Vitals:   06/18/19 0415 06/18/19 0500 06/18/19 0600 06/18/19 0756  BP: (!) 145/86   (!) 147/78  Pulse: 75 (!) 55 (!) 56 (!) 54  Resp: 19   18  Temp: 97.8 F (36.6  C)   97.7 F (36.5 C)  TempSrc: Oral   Oral  SpO2: 97% 96% 97% 97%  Weight:      Height:       SpO2: 97 % O2 Flow Rate (L/min): 2 L/min   Intake/Output Summary (Last 24 hours) at 06/18/2019 0849 Last data filed at 06/18/2019 0627 Gross per 24 hour  Intake 2858.65 ml  Output 2545 ml  Net 313.65 ml   Filed Weights   06/06/19 0032 06/12/19 0531 06/15/19 1128  Weight: 88 kg 89.2 kg 84.3 kg    Exam: General exam: In no acute distress. Respiratory system: Good air movement and clear to auscultation. Cardiovascular system: S1 & S2 heard, RRR. No JVD. Gastrointestinal system: Abdomen is nondistended, soft and nontender.  Central nervous system: Alert and oriented. No focal neurological deficits. Extremities: No pedal edema. Skin: No rashes, lesions or ulcers Psychiatry: Judgement and insight appear normal. Mood & affect appropriate.   Data Reviewed:    Labs: Basic Metabolic Panel: Recent Labs  Lab 06/14/19 0410 06/15/19 0300 06/16/19 0915 06/16/19 1100 06/17/19 0600 06/18/19 0445  NA 139 140 136 140 140 139  K 3.8 3.8 4.7 4.1 4.2 4.4  CL 104 105 105 105 108 107  CO2 26 25 25 24 23 25   GLUCOSE 116* 85 301* 123* 113* 100*  BUN 20 16 16 16 18 19   CREATININE 1.12 1.02 0.87 0.88 0.86 0.78  CALCIUM 8.6* 8.5* 8.0* 8.7* 8.6* 8.9  MG 2.3 2.3 2.0 2.2 2.1  --   PHOS  --   --  2.7 3.0 3.3  --    GFR Estimated Creatinine Clearance: 102.6 mL/min (by C-G formula based on SCr of 0.78 mg/dL). Liver Function Tests: Recent Labs  Lab 06/13/19 0510 06/14/19 0410 06/15/19 0300 06/16/19 0915  AST 37 48* 107* 84*  ALT 55* 57* 93* 107*  ALKPHOS 89 92 114 123  BILITOT 1.3* 1.4* 1.2 1.0  PROT 6.9 6.6 6.3* 6.2*  ALBUMIN 3.2* 3.2* 3.0* 2.7*   No results for input(s): LIPASE, AMYLASE in the last 168 hours. No results for input(s): AMMONIA in the last 168 hours. Coagulation profile No results for input(s): INR, PROTIME in the last 168 hours. COVID-19 Labs  No results for  input(s): DDIMER, FERRITIN, LDH, CRP in the last 72 hours.  Lab Results  Component Value Date   SARSCOV2NAA POSITIVE (A) 06/05/2019    CBC: Recent Labs  Lab 06/13/19 0510 06/14/19 0410 06/15/19 0300 06/16/19 0550 06/17/19 0600 06/18/19 0445  WBC 1.9* 3.6* 3.8* 3.5* 3.9* 4.6  NEUTROABS 1.0* 2.1 2.2 2.0  --   --   HGB 13.1 13.3 12.5* 12.2* 12.8* 12.9*  HCT 39.1 39.8 38.9* 37.9* 39.2 39.1  MCV 89.1 90.2 92.0 95.2 90.7 90.5  PLT 272 308 298 292 289 271   Cardiac Enzymes: No results for input(s): CKTOTAL, CKMB, CKMBINDEX, TROPONINI in the last 168 hours. BNP (last 3 results) No results for input(s): PROBNP in the last 8760 hours. CBG: Recent Labs  Lab 06/17/19 1613 06/17/19 2044 06/18/19 0001 06/18/19 0412 06/18/19  0756  GLUCAP 112* 115* 115* 119* 106*   D-Dimer: No results for input(s): DDIMER in the last 72 hours. Hgb A1c: No results for input(s): HGBA1C in the last 72 hours. Lipid Profile: Recent Labs    06/16/19 0915 06/16/19 1100  TRIG 124 132   Thyroid function studies: No results for input(s): TSH, T4TOTAL, T3FREE, THYROIDAB in the last 72 hours.  Invalid input(s): FREET3 Anemia work up: No results for input(s): VITAMINB12, FOLATE, FERRITIN, TIBC, IRON, RETICCTPCT in the last 72 hours. Sepsis Labs: Recent Labs  Lab 06/15/19 0300 06/16/19 0550 06/17/19 0600 06/18/19 0445  WBC 3.8* 3.5* 3.9* 4.6   Microbiology Recent Results (from the past 240 hour(s))  MRSA PCR Screening     Status: None   Collection Time: 06/11/19  2:17 PM  Result Value Ref Range Status   MRSA by PCR NEGATIVE NEGATIVE Final    Comment:        The GeneXpert MRSA Assay (FDA approved for NASAL specimens only), is one component of a comprehensive MRSA colonization surveillance program. It is not intended to diagnose MRSA infection nor to guide or monitor treatment for MRSA infections. Performed at [facility], [place] [address], [place], [place] [address]       Medications:   . aspirin EC  81 mg Oral QHS  . Chlorhexidine Gluconate Cloth  6 each Topical Daily  . enoxaparin (LOVENOX) injection  120 mg Subcutaneous Q24H  . finasteride  5 mg Oral Daily  . insulin aspart  0-9 Units Subcutaneous Q4H  . pantoprazole (PROTONIX) IV  40 mg Intravenous Q24H  . rosuvastatin  40 mg Oral Daily  . sodium chloride flush  10-40 mL Intracatheter Q12H   Continuous Infusions: . sodium chloride 10 mL/hr at 06/17/19 1600  . TPN ADULT (ION) 95 mL/hr at 06/18/19 0434  . TPN ADULT (ION)       LOS: 12 days   Charlynne Cousins  [facility]  06/18/2019, 8:49 AM

## 2019-06-18 NOTE — Progress Notes (Signed)
PHARMACY - ADULT TOTAL PARENTERAL NUTRITION CONSULT NOTE  Pharmacy Consult:  TPN Indication: Prolonged Ileus   Patient Measurements: Height: 6' 1" (185.4 cm) Weight: 185 lb 13.6 oz (84.3 kg) IBW/kg (Calculated) : 79.9 TPN AdjBW (KG): 89.2 Body mass index is 24.52 kg/m. Usual Weight: ~200lb  Assessment:  [name] presented on 06/02/19 with with syncope and CP.  Found to be COVID+ and treatment of diarrhea led to ileus now has developed pSBO while admitted, currently with NGT on suction.  Has been passing gas and had multiple BM on 8/26.  Mostly NPO since 8/23.   GI: BL prealbumin slightly low at 14.7, NG O/P 500>820mL, LBM 8/29.  PPI IV Endo: no hx DM, A1c 6.2%.  CBGs 100-122 since increase to goal Insulin requirements in the past 24 hours: 0 units  Lytes: Ca improved, phos from 3>3.3, others wnl Renal: SCr 0.78, BUN WNL - UOP 0.4>0.9 ml/kg/hr, finasteride  Pulm: stable on RA Cards: CAD/PAF/HTN - SBP 140s, HR improved  ASA, Crestor AC: Lovenox for Afib - mild anemia, plts WNL Hepatobil: AST/ALT mildly elevated, alk phos / tbili / TG WNL Neuro: Toradol x5 days, APAP.  Pain score 3-6. ID: COVID+, afebrile, WBC wnl TPN Access: PICC placed 8/27 TPN start date: 06/15/19  Nutritional Goals (per RD rec 8/27): 2205-2470 kCal, 126-152gm protein per day  Current Nutrition:  TPN NPO  Plan:  Continue TPN at goal rate of 95 ml/hr  TPN will provide 134g AA, 319g CHO and 68g ILE for a total of 2307 kCal, meeting 100% of patient's needs Electrolytes in TPN: Cl:Ac 1:1 - standard lytes for now Daily multivitamin in TPN.  Trace elements MWF d/t shortage. Continue sensitive SSI Q4H - consider d/c if continues to be controlled now at goal rate NS at KVO. TPN labs in AM   [name], PharmD Clinical Pharmacist Please check AMION for all [facility] Pharmacy numbers 06/18/2019 7:14 AM    

## 2019-06-19 ENCOUNTER — Inpatient Hospital Stay (HOSPITAL_COMMUNITY): Payer: Medicare Other

## 2019-06-19 LAB — CBC
HCT: 40.7 % (ref 39.0–52.0)
Hemoglobin: 13.3 g/dL (ref 13.0–17.0)
MCH: 29.6 pg (ref 26.0–34.0)
MCHC: 32.7 g/dL (ref 30.0–36.0)
MCV: 90.6 fL (ref 80.0–100.0)
Platelets: 266 10*3/uL (ref 150–400)
RBC: 4.49 MIL/uL (ref 4.22–5.81)
RDW: 12.2 % (ref 11.5–15.5)
WBC: 5.6 10*3/uL (ref 4.0–10.5)
nRBC: 0 % (ref 0.0–0.2)

## 2019-06-19 LAB — DIFFERENTIAL
Abs Immature Granulocytes: 0.13 10*3/uL — ABNORMAL HIGH (ref 0.00–0.07)
Basophils Absolute: 0.1 10*3/uL (ref 0.0–0.1)
Basophils Relative: 1 %
Eosinophils Absolute: 0.1 10*3/uL (ref 0.0–0.5)
Eosinophils Relative: 2 %
Immature Granulocytes: 2 %
Lymphocytes Relative: 17 %
Lymphs Abs: 1 10*3/uL (ref 0.7–4.0)
Monocytes Absolute: 0.8 10*3/uL (ref 0.1–1.0)
Monocytes Relative: 14 %
Neutro Abs: 3.6 10*3/uL (ref 1.7–7.7)
Neutrophils Relative %: 64 %

## 2019-06-19 LAB — COMPREHENSIVE METABOLIC PANEL
ALT: 168 U/L — ABNORMAL HIGH (ref 0–44)
AST: 99 U/L — ABNORMAL HIGH (ref 15–41)
Albumin: 3.3 g/dL — ABNORMAL LOW (ref 3.5–5.0)
Alkaline Phosphatase: 150 U/L — ABNORMAL HIGH (ref 38–126)
Anion gap: 7 (ref 5–15)
BUN: 21 mg/dL (ref 8–23)
CO2: 24 mmol/L (ref 22–32)
Calcium: 9.2 mg/dL (ref 8.9–10.3)
Chloride: 107 mmol/L (ref 98–111)
Creatinine, Ser: 0.81 mg/dL (ref 0.61–1.24)
GFR calc Af Amer: >60 mL/min (ref >60)
GFR calc non Af Amer: >60 mL/min (ref >60)
Glucose, Bld: 97 mg/dL (ref 70–99)
Potassium: 4.7 mmol/L (ref 3.5–5.1)
Sodium: 138 mmol/L (ref 135–145)
Total Bilirubin: 0.6 mg/dL (ref 0.3–1.2)
Total Protein: 6.9 g/dL (ref 6.5–8.1)

## 2019-06-19 LAB — GLUCOSE, CAPILLARY
Glucose-Capillary: 120 mg/dL — ABNORMAL HIGH (ref 70–99)
Glucose-Capillary: 121 mg/dL — ABNORMAL HIGH (ref 70–99)
Glucose-Capillary: 113 mg/dL — ABNORMAL HIGH (ref 70–99)
Glucose-Capillary: 135 mg/dL — ABNORMAL HIGH (ref 70–99)
Glucose-Capillary: 129 mg/dL — ABNORMAL HIGH (ref 70–99)
Glucose-Capillary: 131 mg/dL — ABNORMAL HIGH (ref 70–99)

## 2019-06-19 LAB — PHOSPHORUS: Phosphorus: 4.2 mg/dL (ref 2.5–4.6)

## 2019-06-19 LAB — PREALBUMIN: Prealbumin: 33.8 mg/dL (ref 18–38)

## 2019-06-19 LAB — TRIGLYCERIDES: Triglycerides: 162 mg/dL — ABNORMAL HIGH (ref <150)

## 2019-06-19 LAB — MAGNESIUM: Magnesium: 2.4 mg/dL (ref 1.7–2.4)

## 2019-06-19 MED ORDER — TRAVASOL 10 % IV SOLN
INTRAVENOUS | Status: AC
  Administered 2019-06-19: 19:00:00 via INTRAVENOUS
  Filled 2019-06-19: qty 1345.2

## 2019-06-19 NOTE — Progress Notes (Signed)
[facility] PROGRESS NOTE    Progress Note  [name]  [id number] DOB: January 04, 1951 DOA: 06/05/2019 PCP: [name], FNP     Brief Narrative:   [name] is an 68 y.o. male past medical history significant for coronary artery disease, paroxysmal atrial fibrillation hypertension who has been feeling well until Thursday when he was sitting in his golf cart and suddenly started getting lightheaded.  As per witnesses he passed out and woke up immediately felt some chest tightness.  He describes as bricks sitting on his chest.  Work told him his heart rate was slow and fast but does not remember the rate, he took 2 nitroglycerin that relieved the pain.  In the ER he was found to have a COVID-19 infection and developed partial small bowel obstruction in-house  Assessment/Plan:   COVID-19 virus infection: I think he has surpassed his COVID-19 infection.  Syncope likely due to orthostatic hypotension: In setting of antidepressant medication and diarrhea orthostatic vitals are resolved. Physical therapy evaluated the patient who recommended 24-hour home supervision.  Acute kidney injury: Likely prerenal azotemia resolved with IV fluid hydration.  Paroxismal atrial fibrillation: CHADS VASC 2 score > 2 Currently rate controlled continue IV heparin will need to follow-up with cardiology as an outpatient.  Chest pressure resolved: Likely due to atrial fibrillation in the setting of orthostasis. He is currently asymptomatic continue aspirin and Eliquis patient is currently chest pain-free. We will need to follow-up with cardiology as an outpatient for possible stress test.  COVID-19 induced gastroenteritis/SBO: I personally reviewed his abdominal x-ray looks improved compared to yesterday, he is passing a lot of gas this morning his abdomen is significantly distended on physical exam we will clamp NG tube His NG tube output is decreasing. We will have him have ice  chips. Continue IV hydration out of bed to chair, physical therapy to continue to work with the patient. Try to keep electrolytes stable and avoid narcotics.  Nutrition status: The patient has been without nutrition now for more than 7 days, he was discussed with surgery, PICC line was placed and we will start nutrition per pharmacy protocol.  Essential hypertension: Continue to hold antihypertensive medication.   DVT prophylaxis: lovenxo Family Communication:none Disposition Plan/Barrier to D/C: Once tolerating a diet. Code Status:     Code Status Orders  (From admission, onward)         Start     Ordered   06/05/19 2230  Full code  Continuous     06/05/19 2244        Code Status History    Date Active Date Inactive Code Status Order ID Comments User Context   05/30/2018 1902 05/31/2018 2154 Full Code [id number]  Skeet Latch, MD ED   12/02/2015 2227 12/03/2015 1852 Full Code [id number]  Erma Heritage, PA Inpatient   Advance Care Planning Activity        IV Access:    Peripheral IV   Procedures and diagnostic studies:   Dg Abd 1 View  Result Date: 06/18/2019 CLINICAL DATA:  Ileus EXAM: ABDOMEN - 1 VIEW COMPARISON:  06/17/2019 FINDINGS: Orogastric or nasogastric tube tip is in the descending duodenum. Persistent moderate ileus pattern affecting the small and large bowel. No worsening. No sign of free air. IMPRESSION: Persistent moderate ileus pattern. Electronically Signed   By: Nelson Chimes M.D.   On: 06/18/2019 11:40   Dg Abd 1 View  Result Date: 06/17/2019 CLINICAL DATA:  Abdominal pain. EXAM: ABDOMEN -  1 VIEW COMPARISON:  06/16/2019 FINDINGS: Nasogastric terminates at the descending duodenum. Gas within normal caliber colon. Gas-filled small bowel loops measure up to 4.4 cm today versus up to 4.7 cm on the prior. No gross free intraperitoneal air. IMPRESSION: Slight improvement in small bowel distension, favoring adynamic ileus. Electronically Signed   By:  Abigail Miyamoto M.D.   On: 06/17/2019 10:53     Medical Consultants:    None.  Anti-Infectives:  None  Subjective:    [name] relates only 1 bowel movement today with passing a lot of gas.  Objective:    Vitals:   06/18/19 1603 06/18/19 1927 06/19/19 0420 06/19/19 0744  BP:  (!) 150/86 (!) 152/75 131/74  Pulse:  (!) 57 (!) 54 (!) 57  Resp: 20 19 18    Temp: 98.4 F (36.9 C) 97.6 F (36.4 C) (!) 97.5 F (36.4 C) 97.9 F (36.6 C)  TempSrc: Oral Oral Oral Oral  SpO2:  97% 94% 97%  Weight:      Height:       SpO2: 97 % O2 Flow Rate (L/min): 2 L/min   Intake/Output Summary (Last 24 hours) at 06/19/2019 0826 Last data filed at 06/19/2019 0755 Gross per 24 hour  Intake 1426.85 ml  Output 3100 ml  Net -1673.15 ml   Filed Weights   06/06/19 0032 06/12/19 0531 06/15/19 1128  Weight: 88 kg 89.2 kg 84.3 kg    Exam: General exam: In no acute distress. Respiratory system: Good air movement and clear to auscultation. Cardiovascular system: S1 & S2 heard, RRR. No JVD, murmurs, rubs, gallops or clicks.  Gastrointestinal system: Abdomen is soft, with no distention at all, positive bowel sounds soft nontender. Central nervous system: Alert and oriented. No focal neurological deficits. Extremities: No pedal edema. Skin: No rashes, lesions or ulcers Psychiatry: Judgement and insight appear normal. Mood & affect appropriate.   Data Reviewed:    Labs: Basic Metabolic Panel: Recent Labs  Lab 06/15/19 0300 06/16/19 0915 06/16/19 1100 06/17/19 0600 06/18/19 0445 06/19/19 0430  NA 140 136 140 140 139 138  K 3.8 4.7 4.1 4.2 4.4 4.7  CL 105 105 105 108 107 107  CO2 25 25 24 23 25 24   GLUCOSE 85 301* 123* 113* 100* 97  BUN 16 16 16 18 19 21   CREATININE 1.02 0.87 0.88 0.86 0.78 0.81  CALCIUM 8.5* 8.0* 8.7* 8.6* 8.9 9.2  MG 2.3 2.0 2.2 2.1  --  2.4  PHOS  --  2.7 3.0 3.3  --  4.2   GFR Estimated Creatinine Clearance: 101.4 mL/min (by C-G formula based on SCr of  0.81 mg/dL). Liver Function Tests: Recent Labs  Lab 06/13/19 0510 06/14/19 0410 06/15/19 0300 06/16/19 0915 06/19/19 0430  AST 37 48* 107* 84* 99*  ALT 55* 57* 93* 107* 168*  ALKPHOS 89 92 114 123 150*  BILITOT 1.3* 1.4* 1.2 1.0 0.6  PROT 6.9 6.6 6.3* 6.2* 6.9  ALBUMIN 3.2* 3.2* 3.0* 2.7* 3.3*   No results for input(s): LIPASE, AMYLASE in the last 168 hours. No results for input(s): AMMONIA in the last 168 hours. Coagulation profile No results for input(s): INR, PROTIME in the last 168 hours. COVID-19 Labs  No results for input(s): DDIMER, FERRITIN, LDH, CRP in the last 72 hours.  Lab Results  Component Value Date   [place] (A) 06/05/2019    CBC: Recent Labs  Lab 06/13/19 0510 06/14/19 0410 06/15/19 0300 06/16/19 0550 06/17/19 0600 06/18/19 0445 06/19/19 0430  WBC 1.9* 3.6* 3.8* 3.5* 3.9* 4.6 5.6  NEUTROABS 1.0* 2.1 2.2 2.0  --   --  3.6  HGB 13.1 13.3 12.5* 12.2* 12.8* 12.9* 13.3  HCT 39.1 39.8 38.9* 37.9* 39.2 39.1 40.7  MCV 89.1 90.2 92.0 95.2 90.7 90.5 90.6  PLT 272 308 298 292 289 271 266   Cardiac Enzymes: No results for input(s): CKTOTAL, CKMB, CKMBINDEX, TROPONINI in the last 168 hours. BNP (last 3 results) No results for input(s): PROBNP in the last 8760 hours. CBG: Recent Labs  Lab 06/18/19 1605 06/18/19 2018 06/18/19 2346 06/19/19 0415 06/19/19 0743  GLUCAP 121* 108* 117* 120* 121*   D-Dimer: No results for input(s): DDIMER in the last 72 hours. Hgb A1c: No results for input(s): HGBA1C in the last 72 hours. Lipid Profile: Recent Labs    06/16/19 1100 06/19/19 0430  TRIG 132 162*   Thyroid function studies: No results for input(s): TSH, T4TOTAL, T3FREE, THYROIDAB in the last 72 hours.  Invalid input(s): FREET3 Anemia work up: No results for input(s): VITAMINB12, FOLATE, FERRITIN, TIBC, IRON, RETICCTPCT in the last 72 hours. Sepsis Labs: Recent Labs  Lab 06/16/19 0550 06/17/19 0600 06/18/19 0445 06/19/19 0430   WBC 3.5* 3.9* 4.6 5.6   Microbiology Recent Results (from the past 240 hour(s))  MRSA PCR Screening     Status: None   Collection Time: 06/11/19  2:17 PM  Result Value Ref Range Status   MRSA by PCR NEGATIVE NEGATIVE Final    Comment:        The GeneXpert MRSA Assay (FDA approved for NASAL specimens only), is one component of a comprehensive MRSA colonization surveillance program. It is not intended to diagnose MRSA infection nor to guide or monitor treatment for MRSA infections. Performed at [facility], [place] [address] [place], [place] [address]      Medications:   . aspirin EC  81 mg Oral QHS  . Chlorhexidine Gluconate Cloth  6 each Topical Daily  . enoxaparin (LOVENOX) injection  120 mg Subcutaneous Q24H  . finasteride  5 mg Oral Daily  . insulin aspart  0-9 Units Subcutaneous Q4H  . pantoprazole (PROTONIX) IV  40 mg Intravenous Q24H  . rosuvastatin  40 mg Oral Daily  . sodium chloride flush  10-40 mL Intracatheter Q12H   Continuous Infusions: . sodium chloride 10 mL/hr at 06/17/19 1600  . TPN ADULT (ION) 95 mL/hr at 06/18/19 1710     LOS: 13 days   Charlynne Cousins  [facility]  06/19/2019, 8:26 AM

## 2019-06-19 NOTE — Progress Notes (Signed)
ANTICOAGULATION CONSULT NOTE - Follow Up Consult  Pharmacy Consult for lovenox Indication: atrial fibrillation  Allergies  Allergen Reactions  . Felodipine Swelling    Legs became swollen  . Niacin Other (See Comments)    Made patient feel flushed    Patient Measurements: Height: 6\' 1"  (185.4 cm) Weight: 185 lb 13.6 oz (84.3 kg) IBW/kg (Calculated) : 79.9 Heparin Dosing Weight: TBW  Vital Signs: Temp: 97.9 F (36.6 C) (08/31 0744) Temp Source: Oral (08/31 0744) BP: 131/74 (08/31 0744) Pulse Rate: 57 (08/31 0744)  Labs: Recent Labs    06/17/19 0600 06/18/19 0445 06/19/19 0430  HGB 12.8* 12.9* 13.3  HCT 39.2 39.1 40.7  PLT 289 271 266  CREATININE 0.86 0.78 0.81    Estimated Creatinine Clearance: 101.4 mL/min (by C-G formula based on SCr of 0.81 mg/dL).   Medications:  Infusions:  . sodium chloride 10 mL/hr at 06/17/19 1600  . TPN ADULT (ION) 95 mL/hr at 06/18/19 1710  . TPN ADULT (ION)      Assessment: 32 YOF with Afib and CHADSVASc score 3 on apixaban at home. Pharmacy consulted to transition to IV heparin. Apixaban has been discontinued and the last inpatient apixaban dose was 8/22 at 2100.    Currently on SQ Lovenox 1.5 mg/kg daily. H/H and Plt wnl. SCr wnl and stable.   Goal of Therapy:  Stroke prevention  Monitor platelets by anticoagulation protocol: Yes   Plan:  Lovenox 1.5mg /kg/day - 120mg  SQ q24 Monitor CBC and renal fx  F/u transitioning back to oral AC.   Albertina Parr, PharmD., BCPS Clinical Pharmacist Clinical phone for 06/19/19 until 5pm: [phone]

## 2019-06-19 NOTE — Progress Notes (Signed)
PHARMACY - ADULT TOTAL PARENTERAL NUTRITION CONSULT NOTE  Pharmacy Consult:  TPN Indication: Prolonged Ileus   Patient Measurements: Height: '6\' 1"'  (185.4 cm) Weight: 185 lb 13.6 oz (84.3 kg) IBW/kg (Calculated) : 79.9 TPN AdjBW (KG): 89.2 Body mass index is 24.52 kg/m. Usual Weight: ~200lb  Assessment:  29 YOM presented on 06/02/19 with with syncope and CP.  Found to be COVID+ and treatment of diarrhea led to ileus now has developed pSBO while admitted, currently with NGT on suction.  Has been passing gas and had multiple BM on 8/26.  Mostly NPO since 8/23.   GI: Pre-albumin improved to 33.8 (BL ~14), NG O/P 820>667m, LBM 8/30.  PPI IV Endo: no hx DM, A1c 6.2%.  CBGs 108-120 Insulin requirements in the past 24 hours: 0 units  Lytes: Lytes wnl  Renal: SCr 0.81, BUN WNL - UOP 0.9>1.1 ml/kg/hr, finasteride  Pulm: stable on RA Cards: CAD/PAF/HTN - SBP 150s, HR 50s,  ASA, Crestor AC: Lovenox for Afib - mild anemia, plts WNL Hepatobil: AST/ALT trending up, alk phos elevated, TG 162 Neuro: prn APAP  ID: COVID+, afebrile, WBC wnl TPN Access: PICC placed 8/27 TPN start date: 06/15/19  Nutritional Goals (per RD rec 8/27): 2205-2470 kCal, 126-152gm protein per day  Current Nutrition:  TPN NPO  Plan:  Continue TPN at goal rate of 95 ml/hr  TPN will provide 134g AA, 319g CHO and 68g ILE for a total of 2307 kCal, meeting 100% of patient's needs Electrolytes in TPN: Cl:Ac 1:1 - standard lytes for now, Increase Na to 60 mEq/L Daily multivitamin and trace elements in TPN. Continue sensitive SSI Q4H - consider d/c if continues to be controlled [facility] in AM   BAlbertina Parr PharmD., BCPS Clinical Pharmacist Clinical phone for 06/19/19 until 5pm: [phone]

## 2019-06-19 NOTE — Progress Notes (Signed)
Physical Therapy Treatment Patient Details Name: [name] MRN: [id number] DOB: 1951-04-03 Today's Date: 06/19/2019    History of Present Illness 68 y.o. male admitted on 06/05/19 for chest pain and syncopal episodes.  Incidently dx with COVID 19.  He has struggled acutely with lightheadedness and diarrhea.  Dx with dehydration and AKI, PAF, chest pressure, mild COVID 19 transaminitis. Devleoped ileus with NG tube placed. Pt with other significant PMH of MI, HTN, CAD, R CEA, bradycardia, A-fib, R knee arthroscopy, coronary angio with stent and multiple heart caths.     PT Comments    Patient feeling better with abdomen visibly less distended. Denied nausea throughout session. Tolerated ambulation x 40 ft x 7 laps on room air with pt holding/pushing IV pole for all but 2 laps. Pt reports he tried to walk in hall yesterday, however when donned face mask, it was too painful on his nose. No gross imbalance noted and pt describes feeling weak, not dizzy or off-balance. Educated in supine exercises he can perform. Discussed with RN allowing pt to walk in his room pushing his IV pole IF his NG tube remains clamped.   Follow Up Recommendations  No PT follow up;Supervision for mobility/OOB     Equipment Recommendations  None recommended by PT    Recommendations for Other Services       Precautions / Restrictions Precautions Precautions: Other (comment) Precaution Comments: syncope, orthostasis, NG tube Restrictions Weight Bearing Restrictions: No    Mobility  Bed Mobility Overal bed mobility: Modified Independent Bed Mobility: Supine to Sit;Sit to Supine     Supine to sit: Modified independent (Device/Increase time) Sit to supine: Modified independent (Device/Increase time)   General bed mobility comments: increased time(good awareness of lines)  Transfers Overall transfer level: Needs assistance Equipment used: None Transfers: Sit to/from Stand Sit to Stand: Min guard         General transfer comment: for safety due to h/o dizziness/orthostasis  Ambulation/Gait Ambulation/Gait assistance: Min guard   Assistive device: IV Pole;None Gait Pattern/deviations: Step-through pattern;Shuffle;Decreased stride length Gait velocity: decreased   General Gait Details: pt reports feeling a little dizzy but pressed on to ambulate; encouraged wide BOS to reduce instability   Stairs             Wheelchair Mobility    Modified Rankin (Stroke Patients Only)       Balance Overall balance assessment: Needs assistance Sitting-balance support: Feet supported;No upper extremity supported Sitting balance-Leahy Scale: Good     Standing balance support: No upper extremity supported Standing balance-Leahy Scale: Fair                              Cognition Arousal/Alertness: Awake/alert Behavior During Therapy: WFL for tasks assessed/performed Overall Cognitive Status: Within Functional Limits for tasks assessed                                        Exercises General Exercises - Upper Extremity Shoulder Flexion: AROM;Both;5 reps General Exercises - Lower Extremity Ankle Circles/Pumps: AROM;Both;5 reps Hip ABduction/ADduction: AROM;Both;Supine;15 reps Other Exercises Other Exercises: squats x 5; bridges x 15    General Comments        Pertinent Vitals/Pain Pain Assessment: 0-10 Faces Pain Scale: Hurts little more Pain Location: abd Pain Descriptors / Indicators: Discomfort Pain Intervention(s): Limited activity within patient's tolerance;Monitored during session  Home Living                      Prior Function            PT Goals (current goals can now be found in the care plan section) Acute Rehab PT Goals Patient Stated Goal: to feel better, [place] Time For Goal Achievement: 06/22/19 Potential to Achieve Goals: Good Progress towards PT goals: Progressing toward  goals    Frequency    Min 3X/week      PT Plan Current plan remains appropriate    Co-evaluation              AM-PAC PT "6 Clicks" Mobility   Outcome Measure  Help needed turning from your back to your side while in a flat bed without using bedrails?: None Help needed moving from lying on your back to sitting on the side of a flat bed without using bedrails?: None Help needed moving to and from a bed to a chair (including a wheelchair)?: A Little Help needed standing up from a chair using your arms (e.g., wheelchair or bedside chair)?: A Little Help needed to walk in hospital room?: A Little Help needed climbing 3-5 steps with a railing? : A Little 6 Click Score: 20    End of Session   Activity Tolerance: Patient tolerated treatment well Patient left: with call bell/phone within reach;in bed Nurse Communication: Mobility status PT Visit Diagnosis: Difficulty in walking, not elsewhere classified (R26.2)     Time: 1039-1112(time corrected 2/2 time waiting for RN to disconnect NG) PT Time Calculation (min) (ACUTE ONLY): 33 min  Charges:  $Gait Training: 8-22 mins $Therapeutic Exercise: 8-22 mins                       Barry Brunner, PT       [name] 06/19/2019, 12:08 PM

## 2019-06-20 ENCOUNTER — Inpatient Hospital Stay (HOSPITAL_COMMUNITY): Payer: Medicare Other

## 2019-06-20 LAB — BASIC METABOLIC PANEL
Anion gap: 6 (ref 5–15)
BUN: 24 mg/dL — ABNORMAL HIGH (ref 8–23)
CO2: 23 mmol/L (ref 22–32)
Calcium: 9.2 mg/dL (ref 8.9–10.3)
Chloride: 106 mmol/L (ref 98–111)
Creatinine, Ser: 0.88 mg/dL (ref 0.61–1.24)
GFR calc Af Amer: >60 mL/min (ref >60)
GFR calc non Af Amer: >60 mL/min (ref >60)
Glucose, Bld: 128 mg/dL — ABNORMAL HIGH (ref 70–99)
Potassium: 4.8 mmol/L (ref 3.5–5.1)
Sodium: 135 mmol/L (ref 135–145)

## 2019-06-20 LAB — GLUCOSE, CAPILLARY
Glucose-Capillary: 139 mg/dL — ABNORMAL HIGH (ref 70–99)
Glucose-Capillary: 143 mg/dL — ABNORMAL HIGH (ref 70–99)
Glucose-Capillary: 120 mg/dL — ABNORMAL HIGH (ref 70–99)
Glucose-Capillary: 125 mg/dL — ABNORMAL HIGH (ref 70–99)

## 2019-06-20 MED ORDER — TRAVASOL 10 % IV SOLN
INTRAVENOUS | Status: AC
  Administered 2019-06-20: 21:00:00 via INTRAVENOUS
  Filled 2019-06-20: qty 1345.2

## 2019-06-20 NOTE — Progress Notes (Signed)
[facility] PROGRESS NOTE    Progress Note  [name]  [id number] DOB: 08/07/1951 DOA: 06/05/2019 PCP: [name], FNP     Brief Narrative:   [name] is an 68 y.o. male past medical history significant for coronary artery disease, paroxysmal atrial fibrillation hypertension who has been feeling well until Thursday when he was sitting in his golf cart and suddenly started getting lightheaded.  As per witnesses he passed out and woke up immediately felt some chest tightness.  He describes as bricks sitting on his chest.  Work told him his heart rate was slow and fast but does not remember the rate, he took 2 nitroglycerin that relieved the pain.  In the ER he was found to have a COVID-19 infection and developed partial small bowel obstruction in-house  Assessment/Plan:   COVID-19 virus infection: I think he has surpassed his COVID-19 infection. His chest x-ray was unremarkable, he never exhibited signs of cough shortness of breath, and he remained afebrile. FTs were mildly elevated which is now improved.  Syncope likely due to orthostatic hypotension: In the setting of antihypertensive medication and diarrhea, there is likely orthostasis. He was hydrated with orthostasis resolved. Physical therapy evaluated the patient and recommended 24-hour home supervision.  Acute kidney injury: Likely prerenal azotemia, resolved with IV fluid hydration.  Paroxismal atrial fibrillation: CHADS VASC 2 score > 2 Currently on IV heparin will need to follow-up with cardiology as an outpatient. Once he is able to tolerate orals we will change him to oral Eliquis.  Chest pressure resolved: Likely due to atrial fibrillation in the setting of orthostasis. The patient is currently asymptomatic. Once he is able to take orals will have to resume aspirin and Eliquis. Outpatient for stress test.  COVID-19 induced gastroenteritis/SBO: Is is x-ray looks slightly better than  yesterday, he continues to pass a lot of gas.  NG tube has been clamped for 24 hours, he is tolerating ice chips, will start a clear liquid diet. If he tolerates it we can probably NG tube this afternoon or tomorrow morning Continue out of bed to chair, physical therapy to continue to work with the patient. Try to keep electrolytes stable and avoid narcotics. Continue TPN  Nutrition status: Continue TPN will overlap with a regular diet for at least 24 hours.  Essential hypertension: Continue to hold antihypertensive medication.   DVT prophylaxis: lovenxo Family Communication:none Disposition Plan/Barrier to D/C: Once tolerating a diet. Code Status:     Code Status Orders  (From admission, onward)         Start     Ordered   06/05/19 2230  Full code  Continuous     06/05/19 2244        Code Status History    Date Active Date Inactive Code Status Order ID Comments User Context   05/30/2018 1902 05/31/2018 2154 Full Code [id number]  Skeet Latch, MD ED   12/02/2015 2227 12/03/2015 1852 Full Code [id number]  Erma Heritage, PA Inpatient   Advance Care Planning Activity        IV Access:    Peripheral IV   Procedures and diagnostic studies:   Dg Abd 1 View  Result Date: 06/19/2019 CLINICAL DATA:  Abdominal pain EXAM: ABDOMEN - 1 VIEW COMPARISON:  06/18/2019 FINDINGS: Again visualized is a enteric tube which terminates within the descending portion of the duodenum. Persistent ileus type bowel gas pattern. No progressive small-bowel dilation compared to prior. No gross free intraperitoneal air. IMPRESSION:  Persistent ileus-type bowel gas pattern, similar to prior. Electronically Signed   By: Davina Poke M.D.   On: 06/19/2019 10:38   Dg Abd 1 View  Result Date: 06/18/2019 CLINICAL DATA:  Ileus EXAM: ABDOMEN - 1 VIEW COMPARISON:  06/17/2019 FINDINGS: Orogastric or nasogastric tube tip is in the descending duodenum. Persistent moderate ileus pattern affecting the  small and large bowel. No worsening. No sign of free air. IMPRESSION: Persistent moderate ileus pattern. Electronically Signed   By: Nelson Chimes M.D.   On: 06/18/2019 11:40     Medical Consultants:    None.  Anti-Infectives:  None  Subjective:    [name] continues to pass gas and had 2 bowel movements in the last 24 hours.  Objective:    Vitals:   06/19/19 0903 06/19/19 1613 06/19/19 2300 06/20/19 0746  BP:  130/79 137/76 128/74  Pulse:  61 (!) 56 (!) 59  Resp:   18 18  Temp:  97.6 F (36.4 C) 97.6 F (36.4 C) 97.8 F (36.6 C)  TempSrc:  Oral Oral Oral  SpO2: 98% 97%  97%  Weight:      Height:       SpO2: 97 % O2 Flow Rate (L/min): 2 L/min   Intake/Output Summary (Last 24 hours) at 06/20/2019 0825 Last data filed at 06/20/2019 0700 Gross per 24 hour  Intake 2549.56 ml  Output 2150 ml  Net 399.56 ml   Filed Weights   06/06/19 0032 06/12/19 0531 06/15/19 1128  Weight: 88 kg 89.2 kg 84.3 kg    Exam: General exam: In no acute distress. Respiratory system: Good air movement and clear to auscultation.. Cardiovascular system: S1 & S2 heard, RRR. No JVD. Gastrointestinal system: Positive bowel sounds soft nontender nondistended. Central nervous system: Alert and oriented. No focal neurological deficits. Extremities: No pedal edema. Skin: No rashes, lesions or ulcers Psychiatry: Judgement and insight appear normal. Mood & affect appropriate.    Data Reviewed:    Labs: Basic Metabolic Panel: Recent Labs  Lab 06/15/19 0300 06/16/19 0915 06/16/19 1100 06/17/19 0600 06/18/19 0445 06/19/19 0430 06/20/19 0540  NA 140 136 140 140 139 138 135  K 3.8 4.7 4.1 4.2 4.4 4.7 4.8  CL 105 105 105 108 107 107 106  CO2 25 25 24 23 25 24 23   GLUCOSE 85 301* 123* 113* 100* 97 128*  BUN 16 16 16 18 19 21  24*  CREATININE 1.02 0.87 0.88 0.86 0.78 0.81 0.88  CALCIUM 8.5* 8.0* 8.7* 8.6* 8.9 9.2 9.2  MG 2.3 2.0 2.2 2.1  --  2.4  --   PHOS  --  2.7 3.0 3.3  --  4.2   --    GFR Estimated Creatinine Clearance: 93.3 mL/min (by C-G formula based on SCr of 0.88 mg/dL). Liver Function Tests: Recent Labs  Lab 06/14/19 0410 06/15/19 0300 06/16/19 0915 06/19/19 0430  AST 48* 107* 84* 99*  ALT 57* 93* 107* 168*  ALKPHOS 92 114 123 150*  BILITOT 1.4* 1.2 1.0 0.6  PROT 6.6 6.3* 6.2* 6.9  ALBUMIN 3.2* 3.0* 2.7* 3.3*   No results for input(s): LIPASE, AMYLASE in the last 168 hours. No results for input(s): AMMONIA in the last 168 hours. Coagulation profile No results for input(s): INR, PROTIME in the last 168 hours. COVID-19 Labs  No results for input(s): DDIMER, FERRITIN, LDH, CRP in the last 72 hours.  Lab Results  Component Value Date   SARSCOV2NAA POSITIVE (A) 06/05/2019    CBC:  Recent Labs  Lab 06/14/19 0410 06/15/19 0300 06/16/19 0550 06/17/19 0600 06/18/19 0445 06/19/19 0430  WBC 3.6* 3.8* 3.5* 3.9* 4.6 5.6  NEUTROABS 2.1 2.2 2.0  --   --  3.6  HGB 13.3 12.5* 12.2* 12.8* 12.9* 13.3  HCT 39.8 38.9* 37.9* 39.2 39.1 40.7  MCV 90.2 92.0 95.2 90.7 90.5 90.6  PLT 308 298 292 289 271 266   Cardiac Enzymes: No results for input(s): CKTOTAL, CKMB, CKMBINDEX, TROPONINI in the last 168 hours. BNP (last 3 results) No results for input(s): PROBNP in the last 8760 hours. CBG: Recent Labs  Lab 06/19/19 1125 06/19/19 1612 06/19/19 2133 06/19/19 2329 06/20/19 0420  GLUCAP 113* 135* 129* 131* 139*   D-Dimer: No results for input(s): DDIMER in the last 72 hours. Hgb A1c: No results for input(s): HGBA1C in the last 72 hours. Lipid Profile: Recent Labs    06/19/19 0430  TRIG 162*   Thyroid function studies: No results for input(s): TSH, T4TOTAL, T3FREE, THYROIDAB in the last 72 hours.  Invalid input(s): FREET3 Anemia work up: No results for input(s): VITAMINB12, FOLATE, FERRITIN, TIBC, IRON, RETICCTPCT in the last 72 hours. Sepsis Labs: Recent Labs  Lab 06/16/19 0550 06/17/19 0600 06/18/19 0445 06/19/19 0430  WBC 3.5* 3.9*  4.6 5.6   Microbiology Recent Results (from the past 240 hour(s))  MRSA PCR Screening     Status: None   Collection Time: 06/11/19  2:17 PM  Result Value Ref Range Status   MRSA by PCR NEGATIVE NEGATIVE Final    Comment:        The GeneXpert MRSA Assay (FDA approved for NASAL specimens only), is one component of a comprehensive MRSA colonization surveillance program. It is not intended to diagnose MRSA infection nor to guide or monitor treatment for MRSA infections. Performed at [facility], [address] [place], [place] [address]      Medications:   . aspirin EC  81 mg Oral QHS  . Chlorhexidine Gluconate Cloth  6 each Topical Daily  . enoxaparin (LOVENOX) injection  120 mg Subcutaneous Q24H  . finasteride  5 mg Oral Daily  . insulin aspart  0-9 Units Subcutaneous Q4H  . pantoprazole (PROTONIX) IV  40 mg Intravenous Q24H  . rosuvastatin  40 mg Oral Daily  . sodium chloride flush  10-40 mL Intracatheter Q12H   Continuous Infusions: . sodium chloride 10 mL/hr at 06/17/19 1600  . TPN ADULT (ION) 95 mL/hr at 06/20/19 0400     LOS: 14 days   Charlynne Cousins  [facility]  06/20/2019, 8:25 AM

## 2019-06-20 NOTE — Progress Notes (Signed)
Patient stated he updated his contact and did not need this writer to call his spouse nor daughter this evening.  Patient is alert and oriented.  ICU nurse started TPN and it is infusing as ordered.  All needs met.  No acute distress present.  See chart for assessment.

## 2019-06-20 NOTE — Progress Notes (Signed)
Checked on patient receiving TPN hung during shift change.  The patient, line and IV site appeared intact and infusing with no complications at this time.

## 2019-06-20 NOTE — Progress Notes (Signed)
  Chrystie Nose, patient's daughter, updated on patient's status.  Daughter concerned about father's loss of weight since he has been here but once she found out the weight on admission and current weight she felt better.  Patient also requesting for hospitalist to contact her tomorrow to see if GI could be consulted.  All questions and concerned addressed.

## 2019-06-20 NOTE — Progress Notes (Addendum)
Occupational Therapy Treatment Patient Details Name: [name] MRN: [id number] DOB: [birth year] Today's Date: 06/20/2019    History of present illness 68 y.o. male admitted on 06/05/19 for chest pain and syncopal episodes.  Incidently dx with COVID 19.  He has struggled acutely with lightheadedness and diarrhea.  Dx with dehydration and AKI, PAF, chest pressure, mild COVID 19 transaminitis. Devleoped ileus with NG tube placed. Pt with other significant PMH of MI, HTN, CAD, R CEA, bradycardia, A-fib, R knee arthroscopy, coronary angio with stent and multiple heart caths.    OT comments  Completed ADL task in room then ambulated @ 440 ft on RA with SpO2 97. Pt states he has minimal nausea after eating/drinking his lunch today. Will follow acutely to facilitate safe DC home. Encouraged pt to walk with nursing.  Follow Up Recommendations  Supervision/Assistance - 24 hour    Equipment Recommendations  3 in 1 bedside commode    Recommendations for Other Services PT consult    Precautions / Restrictions Precautions Precautions: Other (comment) Precaution Comments: syncope, orthostasis, NG tube       Mobility Bed Mobility Overal bed mobility: Modified Independent                Transfers   Equipment used: None   Sit to Stand: Supervision              Balance     Sitting balance-Leahy Scale: Good       Standing balance-Leahy Scale: Fair                             ADL either performed or assessed with clinical judgement   ADL                       Lower Body Dressing: Minimal assistance Lower Body Dressing Details (indicate cue type and reason): min A - pt unable to reach feet Toilet Transfer: Min guard;Ambulation           Functional mobility during ADLs: Min guard       Vision       Perception     Praxis      Cognition Arousal/Alertness: Awake/alert Behavior During Therapy: WFL for tasks assessed/performed Overall  Cognitive Status: Within Functional Limits for tasks assessed                                          Exercises     Shoulder Instructions       General Comments Ambulated 440 ft    Pertinent Vitals/ Pain       Pain Assessment: Faces Faces Pain Scale: Hurts a little bit Pain Location: nausea Pain Descriptors / Indicators: (nausea) Pain Intervention(s): Limited activity within patient's tolerance  Home Living                                          Prior Functioning/Environment              Frequency  Min 3X/week        Progress Toward Goals  OT Goals(current goals can now be found in the care plan section)  Progress towards OT goals: Progressing toward goals  Acute Rehab OT Goals Patient  Stated Goal: to feel better, [place] again OT Goal Formulation: With patient Time For Goal Achievement: 06/30/19 Potential to Achieve Goals: Good ADL Goals Pt Will Perform Grooming: with modified independence;standing Pt Will Transfer to Toilet: with modified independence;ambulating Pt Will Perform Toileting - Clothing Manipulation and hygiene: with modified independence;sit to/from stand Pt/caregiver will Perform Home Exercise Program: Both right and left upper extremity;With theraband;Independently;With written HEP provided Additional ADL Goal #1: Pt will verbalize at least 3 ways of conserving energy with ADL at independent level  Plan Discharge plan remains appropriate    Co-evaluation                 AM-PAC OT "6 Clicks" Daily Activity     Outcome Measure   Help from another person eating meals?: None Help from another person taking care of personal grooming?: A Little Help from another person toileting, which includes using toliet, bedpan, or urinal?: A Little Help from another person bathing (including washing, rinsing, drying)?: A Little Help from another person to put on and taking off  regular upper body clothing?: A Little Help from another person to put on and taking off regular lower body clothing?: A Little 6 Click Score: 19    End of Session    OT Visit Diagnosis: Unsteadiness on feet (R26.81);Muscle weakness (generalized) (M62.81)   Activity Tolerance Patient tolerated treatment well   Patient Left in bed;with call bell/phone within reach   Nurse Communication Mobility status        Time: [id number] OT Time Calculation (min): 23 min  Charges: OT General Charges $OT Visit: 1 Visit OT Treatments $Self Care/Home Management : 23-37 mins  Maurie Boettcher, OT/L   Acute OT Clinical Specialist [place] Pager [phone] Office [phone]    [facility] 06/20/2019, 2:47 PM

## 2019-06-20 NOTE — Progress Notes (Signed)
PHARMACY - ADULT TOTAL PARENTERAL NUTRITION CONSULT NOTE  Pharmacy Consult:  TPN Indication: Prolonged Ileus   Patient Measurements: Height: '6\' 1"'  (185.4 cm) Weight: 185 lb 13.6 oz (84.3 kg) IBW/kg (Calculated) : 79.9 TPN AdjBW (KG): 89.2 Body mass index is 24.52 kg/m. Usual Weight: ~200lb  Assessment:  64 YOM presented on 06/02/19 with with syncope and CP.  Found to be COVID+ and treatment of diarrhea led to ileus now has developed pSBO while admitted, currently with NGT on suction.  Has been passing gas and had multiple BM on 8/26.  Mostly NPO since 8/23.   GI: Pre-albumin improved to 33.8 (BL ~14), NG O/P 820>654m, LBM 8/30.  PPI IV. Clamping NG tube today and advancing diet to clear liquid.  Endo: no hx DM, A1c 6.2%.  CBGs 113-143 Insulin requirements in the past 24 hours: 5 units  Lytes: Lytes wnl  Renal: Na 138>135, other lytes wnl, SCr 0.88, BUN 24 - UOP 1.2 ml/kg/hr, finasteride  Pulm: stable on RA Cards: CAD/PAF/HTN - SBP 120s, HR 60s,  ASA, Crestor AC: Lovenox for Afib - mild anemia, plts WNL Hepatobil: AST/ALT trending up, alk phos elevated, TG 162 Neuro: prn APAP  ID: COVID+, afebrile, WBC wnl TPN Access: PICC placed 8/27 TPN start date: 06/15/19  Nutritional Goals (per RD rec 8/27): 2205-2470 kCal, 126-152gm protein per day  Current Nutrition:  TPN Advancing to clear liquid diet   Plan:  Continue TPN at goal rate of 95 ml/hr  TPN will provide 134g AA, 319g CHO and 68g ILE for a total of 2307 kCal, meeting 100% of patient's needs Electrolytes in TPN: Cl:Ac 1:1 - standard lytes for now, Increase Na to 80 mEq/L Daily multivitamin and trace elements in TPN. Continue sensitive SSI Q4H - consider d/c if continues to be controlled [facility] in AM F/u tolerance to clear liquid diet    BAlbertina Parr PharmD., BCPS Clinical Pharmacist Clinical phone for 06/20/19 until 5pm: [phone]

## 2019-06-21 LAB — BASIC METABOLIC PANEL
Anion gap: 10 (ref 5–15)
BUN: 24 mg/dL — ABNORMAL HIGH (ref 8–23)
CO2: 23 mmol/L (ref 22–32)
Calcium: 9.3 mg/dL (ref 8.9–10.3)
Chloride: 105 mmol/L (ref 98–111)
Creatinine, Ser: 0.86 mg/dL (ref 0.61–1.24)
GFR calc Af Amer: >60 mL/min (ref >60)
GFR calc non Af Amer: >60 mL/min (ref >60)
Glucose, Bld: 118 mg/dL — ABNORMAL HIGH (ref 70–99)
Potassium: 4.7 mmol/L (ref 3.5–5.1)
Sodium: 138 mmol/L (ref 135–145)

## 2019-06-21 LAB — GLUCOSE, CAPILLARY
Glucose-Capillary: 124 mg/dL — ABNORMAL HIGH (ref 70–99)
Glucose-Capillary: 127 mg/dL — ABNORMAL HIGH (ref 70–99)
Glucose-Capillary: 129 mg/dL — ABNORMAL HIGH (ref 70–99)
Glucose-Capillary: 129 mg/dL — ABNORMAL HIGH (ref 70–99)
Glucose-Capillary: 131 mg/dL — ABNORMAL HIGH (ref 70–99)
Glucose-Capillary: 135 mg/dL — ABNORMAL HIGH (ref 70–99)
Glucose-Capillary: 137 mg/dL — ABNORMAL HIGH (ref 70–99)

## 2019-06-21 MED ORDER — TRAVASOL 10 % IV SOLN
INTRAVENOUS | Status: AC
  Administered 2019-06-21: 22:00:00 via INTRAVENOUS
  Filled 2019-06-21: qty 1345.2

## 2019-06-21 MED ORDER — ENSURE ENLIVE PO LIQD
237.0000 mL | Freq: Three times a day (TID) | ORAL | Status: DC
  Administered 2019-06-21 – 2019-06-22 (×4): 237 mL via ORAL

## 2019-06-21 NOTE — Progress Notes (Signed)
Spoke with patient's daughter, Crystal.  Updated her on patient's current Full Liquid diet and said he was tolerating it well.  Likely will be advanced again tomorrow and hopeful to discharge on Friday.  Answered all questions and addressed all concerns.   No acute distress present with patient.  See chart for assessment.

## 2019-06-21 NOTE — TOC Progression Note (Signed)
Transition of Care [facility]) - Progression Note    Patient Details  Name: [name] MRN: [id number] Date of Birth: Apr 04, 1951  Transition of Care [employer] [facility]) CM/SW Contact  Loletha Grayer Beverely Pace, RN Phone Number: [phone] (working remotely) I9/11/2018, 11:12 AM   Clinical Narrative:   68 yr old gentleman that transferred from [place] with ileus, SBO and was fouind to be COVID +. Patient's NGT will be removed today, and per MD notes, diet will be advanced to liguids, tomorrow TPN will be discontinued. Patient is thankfully improving. Case manager will continue to monitor for discharge needs.    Expected Discharge Plan: Home/Self Care Barriers to Discharge: Continued Medical Work up  Expected Discharge Plan and Services Expected Discharge Plan: Home/Self Care   Discharge Planning Services: CM Consult Post Acute Care Choice: Durable Medical Equipment Living arrangements for the past 2 months: Single Family Home Expected Discharge Date: 06/10/19                                     Social Determinants of Health (SDOH) Interventions    Readmission Risk Interventions No flowsheet data found.

## 2019-06-21 NOTE — Progress Notes (Signed)
Nutrition Follow-up  DOCUMENTATION CODES:   Not applicable  INTERVENTION:   TPN per Pharmacy; recommend continuing TPN until diet advanced to include solid foods and pt meeting >60% of estimated needs via oral route  Ensure Enlive po TID, each supplement provides 350 kcal and 20 grams of protein  NUTRITION DIAGNOSIS:   Inadequate oral intake related to acute illness, altered GI function as evidenced by NPO status, meal completion < 25%.  Being addressed via TPN, diet advancement, supplements  GOAL:   Patient will meet greater than or equal to 90% of their needs  Met via nutrition support, po intake progressing  MONITOR:   Diet advancement, Labs, Weight trends, Other (Comment), I & O's, Skin(TPN)  REASON FOR ASSESSMENT:   Consult New TPN/TNA  ASSESSMENT:   68 yo male admitted with syncope, AKI, COVID 19 + viral gastroenteritis. PMH includes CAD, HTN, HLD, GERD, MI  8/17 Admit 8/22 NPO, KUB showed SBO 8/23 NG tube inserted, CT scan favoring ileus vs obstruction  8/27 TPN initiated 9/01 Diet advanced to CL 9/02 Diet advanced to FL, NG tube removed  TPN continue at goal rate of 95 ml/hr providing 134 g of protein and 2307 kcals.   Pt eating 50% of FL trays today; tolerated CL yesterday  Weight 84.3 kg on 8/27; admission weight around 88-89 kg. Need to obtain new weight   Labs: reviewed Meds: ss novolog   Diet Order:   Diet Order            Diet full liquid Room service appropriate? Yes; Fluid consistency: Thin  Diet effective now              EDUCATION NEEDS:   Not appropriate for education at this time  Skin:  Skin Assessment: Reviewed RN Assessment  Last BM:  9/1  Height:   Ht Readings from Last 1 Encounters:  06/12/19 '6\' 1"'  (1.854 m)    Weight:   Wt Readings from Last 1 Encounters:  06/15/19 84.3 kg    Ideal Body Weight:  83.6 kg  BMI:  Body mass index is 24.52 kg/m.  Estimated Nutritional Needs:   Kcal:  8403-7543  kcals  Protein:  126-152  Fluid:  >/= 2 L   Cate Rico Junker MS, RDN, LDN, CNSC [phone] Pager  [phone] Weekend/On-Call Pager

## 2019-06-21 NOTE — Progress Notes (Signed)
[facility] PROGRESS NOTE    Progress Note  DAEVON NORQUIST  [id number] DOB: 1951/07/26 DOA: 06/05/2019 PCP: [name], FNP     Brief Narrative:   [name] is an 68 y.o. male past medical history significant for coronary artery disease, paroxysmal atrial fibrillation hypertension who has been feeling well until Thursday when he was sitting in his golf cart and suddenly started getting lightheaded.  As per witnesses he passed out and woke up immediately felt some chest tightness.  He describes as bricks sitting on his chest.  Work told him his heart rate was slow and fast but does not remember the rate, he took 2 nitroglycerin that relieved the pain.  In the ER he was found to have a COVID-19 infection and developed partial small bowel obstruction in-house  Assessment/Plan:   Small bowel obstruction Patient was initially seen by general surgery while have he was in the other facility.  NG tube was left in place.  Patient had a prolonged ileus.  He was started on TPN.  He appears to have improved in the last 3 to 4 days.  He had 5 bowel movements yesterday.  He is tolerated his clear liquid diet.  His abdomen feels benign on examination.  X-ray from yesterday shows improvement with gas noted in the small bowel as well as large bowels.  NG tube was clamped 2 days ago.  We will remove NG tube today.  Continue to mobilize him.  Advance him to full liquid diet.  If he tolerates this well we will advance him to soft diet tomorrow and discontinue his TPN.    COVID-19 virus infection: Remains asymptomatic from COVID-19 standpoint.  Patient's chest x-ray was unremarkable.  He actually never developed any respiratory symptoms from COVID-19.    Syncope likely due to orthostatic hypotension: Likely due to orthostatic hypotension from volume loss.  Appears to have resolved.  Physical therapy is following.    Acute kidney injury: Likely prerenal azotemia, resolved with IV fluid  hydration.  Paroxismal atrial fibrillation: CHADS VASC 2 score > 2.  Patient was on Eliquis prior to hospitalization.  Initially placed on IV heparin.  Now on Lovenox.  Change him back to Eliquis tomorrow if he continues to do well with oral intake.  Chest pressure, resolved Likely due to atrial fibrillation in the setting of orthostasis.  Currently asymptomatic.  Outpatient follow-up with primary care provider and or cardiology.  Nutrition status: Currently on TPN.  Anticipate stopping it tomorrow if he continues to do well with oral intake.  Essential hypertension: Continue to hold antihypertensive medication.   DVT prophylaxis: lovenxo Family Communication:none Disposition Plan/Barrier to D/C: Once tolerating a diet. Code Status: Full code   IV Access:    Peripheral IV   Procedures and diagnostic studies:   Dg Abd 1 View  Result Date: 06/20/2019 CLINICAL DATA:  Ileus EXAM: ABDOMEN - 1 VIEW COMPARISON:  Portable exam 0725 hours compared to 06/19/2019 FINDINGS: Tip of nasogastric tube projects over distal descending duodenum. Nonobstructive bowel gas pattern. Gas identified within nondistended large and small bowel loops. No bowel wall thickening or dilatation. No urinary tract calcification or acute osseous findings. IMPRESSION: Nonobstructive bowel gas pattern. Electronically Signed   By: Lavonia Dana M.D.   On: 06/20/2019 08:57     Medical Consultants:    None.  Anti-Infectives:  None  Subjective:    Patient feels well.  States that he had multiple bowel movements yesterday.  He is passing  gas.  Denies any shortness of breath.  No abdominal pain.  No nausea vomiting.  Tolerating his clear liquids.  Objective:    Vitals:   06/20/19 0746 06/20/19 0906 06/20/19 1627 06/21/19 0000  BP: 128/74  (!) 144/76 140/86  Pulse: (!) 59  63 66  Resp: 18  18 18   Temp: 97.8 F (36.6 C)  98.2 F (36.8 C) 97.6 F (36.4 C)  TempSrc: Oral  Oral Oral  SpO2: 97% 97% 97% 98%   Weight:      Height:       SpO2: 98 % O2 Flow Rate (L/min): 2 L/min   Intake/Output Summary (Last 24 hours) at 06/21/2019 1030 Last data filed at 06/21/2019 0500 Gross per 24 hour  Intake 1703.46 ml  Output 701 ml  Net 1002.46 ml   Filed Weights   06/06/19 0032 06/12/19 0531 06/15/19 1128  Weight: 88 kg 89.2 kg 84.3 kg    Exam:   General appearance: Awake alert.  In no distress Resp: Clear to auscultation bilaterally.  Normal effort Cardio: S1-S2 is normal regular.  No S3-S4.  No rubs murmurs or bruit GI: Abdomen is soft.  Nontender nondistended.  Bowel sounds are present normal.  No masses organomegaly Extremities: No edema.  Full range of motion of lower extremities. Neurologic: Alert and oriented x3.  No focal neurological deficits.     Data Reviewed:    Labs: Basic Metabolic Panel: Recent Labs  Lab 06/15/19 0300 06/16/19 0915 06/16/19 1100 06/17/19 0600 06/18/19 0445 06/19/19 0430 06/20/19 0540 06/21/19 0400  NA 140 136 140 140 139 138 135 138  K 3.8 4.7 4.1 4.2 4.4 4.7 4.8 4.7  CL 105 105 105 108 107 107 106 105  CO2 25 25 24 23 25 24 23 23   GLUCOSE 85 301* 123* 113* 100* 97 128* 118*  BUN 16 16 16 18 19 21  24* 24*  CREATININE 1.02 0.87 0.88 0.86 0.78 0.81 0.88 0.86  CALCIUM 8.5* 8.0* 8.7* 8.6* 8.9 9.2 9.2 9.3  MG 2.3 2.0 2.2 2.1  --  2.4  --   --   PHOS  --  2.7 3.0 3.3  --  4.2  --   --    GFR Estimated Creatinine Clearance: 95.5 mL/min (by C-G formula based on SCr of 0.86 mg/dL). Liver Function Tests: Recent Labs  Lab 06/15/19 0300 06/16/19 0915 06/19/19 0430  AST 107* 84* 99*  ALT 93* 107* 168*  ALKPHOS 114 123 150*  BILITOT 1.2 1.0 0.6  PROT 6.3* 6.2* 6.9  ALBUMIN 3.0* 2.7* 3.3*     Lab Results  Component Value Date   SARSCOV2NAA POSITIVE (A) 06/05/2019    CBC: Recent Labs  Lab 06/15/19 0300 06/16/19 0550 06/17/19 0600 06/18/19 0445 06/19/19 0430  WBC 3.8* 3.5* 3.9* 4.6 5.6  NEUTROABS 2.2 2.0  --   --  3.6  HGB 12.5*  12.2* 12.8* 12.9* 13.3  HCT 38.9* 37.9* 39.2 39.1 40.7  MCV 92.0 95.2 90.7 90.5 90.6  PLT 298 292 289 271 266   CBG: Recent Labs  Lab 06/20/19 1145 06/20/19 1938 06/20/19 2337 06/21/19 0341 06/21/19 0744  GLUCAP 120* 125* 137* 127* 129*   Lipid Profile: Recent Labs    06/19/19 0430  TRIG 162*   Sepsis Labs: Recent Labs  Lab 06/16/19 0550 06/17/19 0600 06/18/19 0445 06/19/19 0430  WBC 3.5* 3.9* 4.6 5.6   Microbiology Recent Results (from the past 240 hour(s))  MRSA PCR Screening     Status:  None   Collection Time: 06/11/19  2:17 PM  Result Value Ref Range Status   MRSA by PCR NEGATIVE NEGATIVE Final    Comment:        The GeneXpert MRSA Assay (FDA approved for NASAL specimens only), is one component of a comprehensive MRSA colonization surveillance program. It is not intended to diagnose MRSA infection nor to guide or monitor treatment for MRSA infections. Performed at [facility], [place] [address] [place], [place] [address]      Medications:   . aspirin EC  81 mg Oral QHS  . Chlorhexidine Gluconate Cloth  6 each Topical Daily  . enoxaparin (LOVENOX) injection  120 mg Subcutaneous Q24H  . finasteride  5 mg Oral Daily  . insulin aspart  0-9 Units Subcutaneous Q4H  . pantoprazole (PROTONIX) IV  40 mg Intravenous Q24H  . rosuvastatin  40 mg Oral Daily  . sodium chloride flush  10-40 mL Intracatheter Q12H   Continuous Infusions: . sodium chloride 10 mL/hr at 06/21/19 0500  . TPN ADULT (ION) 95 mL/hr at 06/20/19 2051     LOS: 15 days   [employer]  [facility]  06/21/2019, 10:30 AM

## 2019-06-21 NOTE — Progress Notes (Signed)
PHARMACY - ADULT TOTAL PARENTERAL NUTRITION CONSULT NOTE  Pharmacy Consult:  TPN Indication: Prolonged Ileus   Patient Measurements: Height: '6\' 1"'  (185.4 cm) Weight: 185 lb 13.6 oz (84.3 kg) IBW/kg (Calculated) : 79.9 TPN AdjBW (KG): 89.2 Body mass index is 24.52 kg/m. Usual Weight: ~200lb  Assessment:  66 YOM presented on 06/02/19 with with syncope and CP.  Found to be COVID+ and treatment of diarrhea led to ileus now has developed pSBO while admitted, currently with NGT on suction.  Has been passing gas and had multiple BM on 8/26.  Mostly NPO since 8/23.   GI: Pre-albumin improved to 33.8 (BL ~14), NGT clamped and to be removed today, LBM 9/1.  PPI IV. Advancing diet to full liquids today  Endo: no hx DM, A1c 6.2%.  CBGs 113-143 Insulin requirements in the past 24 hours: 5 units  Lytes: Lytes wnl  Renal: Na 135>138, other lytes wnl, SCr 0.86, BUN 24 - UOP 0.5 ml/kg/hr, finasteride  Pulm: stable on RA Cards: CAD/PAF/HTN - SBP 130s, HR 60s,  ASA, Crestor AC: Lovenox for Afib - mild anemia, plts WNL Hepatobil: AST/ALT trending up, alk phos elevated, TG 162 Neuro: prn APAP  ID: COVID+, afebrile, WBC wnl TPN Access: PICC placed 8/27 TPN start date: 06/15/19  Nutritional Goals (per RD rec 8/27): 2205-2470 kCal, 126-152gm protein per day  Current Nutrition:  TPN Advancing to full liquid diet today   Plan:  Continue TPN at goal rate of 95 ml/hr  TPN will provide 134g AA, 319g CHO and 68g ILE for a total of 2307 kCal, meeting 100% of patient's needs Electrolytes in TPN: Cl:Ac 1:1 - standard lytes for now Daily multivitamin and trace elements in TPN. Continue sensitive SSI Q4H - consider d/c if continues to be controlled now at goal rate TPN labs in AM Advancing to full liquid diet today. If tolerates, will advance to soft diet tomorrow and likely d/c TPN    Albertina Parr, PharmD., BCPS Clinical Pharmacist Clinical phone for 06/21/19 until 5pm: [phone]

## 2019-06-21 NOTE — Progress Notes (Signed)
Physical Therapy Treatment and Discharge Patient Details Name: [name] MRN: [id number] DOB: Jun 12, 1951 Today's Date: 06/21/2019    History of Present Illness 68 y.o. male admitted on 06/05/19 for chest pain and syncopal episodes.  Incidently dx with COVID 19.  He has struggled acutely with lightheadedness and diarrhea.  Dx with dehydration and AKI, PAF, chest pressure, mild COVID 19 transaminitis. Devleoped ileus with NG tube placed. Pt with other significant PMH of MI, HTN, CAD, R CEA, bradycardia, A-fib, R knee arthroscopy, coronary angio with stent and multiple heart caths.     PT Comments    Patient feeling much better now with NG tube out. Able to walk entire unit while pushing his IV pole himself. He remembered to don his mask prior to leaving his room. Met his nurse, Abby, in the hall and she agrees he is ok to walk in the hall by himself. I recommended he call to tell his nurse he will be walking prior to leaving the room. No further PT needs identified.     Follow Up Recommendations  No PT follow up     Equipment Recommendations  None recommended by PT    Recommendations for Other Services       Precautions / Restrictions Precautions Precautions: Other (comment) Restrictions Weight Bearing Restrictions: No    Mobility  Bed Mobility Overal bed mobility: Modified Independent Bed Mobility: Supine to Sit;Sit to Supine     Supine to sit: Modified independent (Device/Increase time) Sit to supine: Modified independent (Device/Increase time)   General bed mobility comments: good awareness of lines  Transfers Overall transfer level: Modified independent Equipment used: None Transfers: Sit to/from Stand Sit to Stand: Modified independent (Device/Increase time)         General transfer comment: denies dizziness during any transfers today  Ambulation/Gait Ambulation/Gait assistance: Modified independent (Device/Increase time) Gait Distance (Feet): 800  Feet Assistive device: IV Pole Gait Pattern/deviations: Step-through pattern;Shuffle;Decreased stride length Gait velocity: decreased   General Gait Details: pt wanted to push IV pole to demonstrate he is safe to walk in hall alone   Stairs             Wheelchair Mobility    Modified Rankin (Stroke Patients Only)       Balance Overall balance assessment: Needs assistance Sitting-balance support: Feet supported;No upper extremity supported Sitting balance-Leahy Scale: Good     Standing balance support: No upper extremity supported Standing balance-Leahy Scale: Good                              Cognition Arousal/Alertness: Awake/alert Behavior During Therapy: WFL for tasks assessed/performed Overall Cognitive Status: Within Functional Limits for tasks assessed                                        Exercises Other Exercises Other Exercises: pt reports he has been doing all exercises on his own    General Comments        Pertinent Vitals/Pain Pain Assessment: No/denies pain    Home Living                      Prior Function            PT Goals (current goals can now be found in the care plan section) Acute Rehab PT Goals Patient Stated  Goal: to feel better, [place] again PT Goal Formulation: With patient Time For Goal Achievement: 06/22/19 Potential to Achieve Goals: Good Progress towards PT goals: Goals met/education completed, patient discharged from PT    Frequency           PT Plan Current plan remains appropriate    Co-evaluation              AM-PAC PT "6 Clicks" Mobility   Outcome Measure  Help needed turning from your back to your side while in a flat bed without using bedrails?: None Help needed moving from lying on your back to sitting on the side of a flat bed without using bedrails?: None Help needed moving to and from a bed to a chair (including a  wheelchair)?: None Help needed standing up from a chair using your arms (e.g., wheelchair or bedside chair)?: None Help needed to walk in hospital room?: None Help needed climbing 3-5 steps with a railing? : None 6 Click Score: 24    End of Session   Activity Tolerance: Patient tolerated treatment well Patient left: with call bell/phone within reach;in bed Nurse Communication: Mobility status;Other (comment)(d/c PT; ok to walk in hall pushing IV (use mask, call RN 1st) PT Visit Diagnosis: Difficulty in walking, not elsewhere classified (R26.2)     Time: 5537-4827 PT Time Calculation (min) (ACUTE ONLY): 10 min  Charges:  $Gait Training: 8-22 mins                       Barry Brunner, PT    PT Discharge Note  Patient is being discharged from PT services secondary to:  Marland Kitchen Goals met and no further therapy needs identified.  Please see latest Therapy Progress Note for current level of functioning and progress toward goals.  Progress and discharge plan and discussed with patient/caregiver and they  . Agree    Barry Brunner, PT        [name] 06/21/2019, 4:59 PM

## 2019-06-22 LAB — COMPREHENSIVE METABOLIC PANEL
ALT: 128 U/L — ABNORMAL HIGH (ref 0–44)
AST: 54 U/L — ABNORMAL HIGH (ref 15–41)
Albumin: 3.5 g/dL (ref 3.5–5.0)
Alkaline Phosphatase: 165 U/L — ABNORMAL HIGH (ref 38–126)
Anion gap: 10 (ref 5–15)
BUN: 26 mg/dL — ABNORMAL HIGH (ref 8–23)
CO2: 23 mmol/L (ref 22–32)
Calcium: 9.4 mg/dL (ref 8.9–10.3)
Chloride: 106 mmol/L (ref 98–111)
Creatinine, Ser: 0.88 mg/dL (ref 0.61–1.24)
GFR calc Af Amer: >60 mL/min (ref >60)
GFR calc non Af Amer: >60 mL/min (ref >60)
Glucose, Bld: 120 mg/dL — ABNORMAL HIGH (ref 70–99)
Potassium: 4.8 mmol/L (ref 3.5–5.1)
Sodium: 139 mmol/L (ref 135–145)
Total Bilirubin: 0.5 mg/dL (ref 0.3–1.2)
Total Protein: 7 g/dL (ref 6.5–8.1)

## 2019-06-22 LAB — GLUCOSE, CAPILLARY
Glucose-Capillary: 115 mg/dL — ABNORMAL HIGH (ref 70–99)
Glucose-Capillary: 119 mg/dL — ABNORMAL HIGH (ref 70–99)
Glucose-Capillary: 122 mg/dL — ABNORMAL HIGH (ref 70–99)
Glucose-Capillary: 125 mg/dL — ABNORMAL HIGH (ref 70–99)
Glucose-Capillary: 127 mg/dL — ABNORMAL HIGH (ref 70–99)
Glucose-Capillary: 136 mg/dL — ABNORMAL HIGH (ref 70–99)
Glucose-Capillary: 136 mg/dL — ABNORMAL HIGH (ref 70–99)

## 2019-06-22 LAB — MAGNESIUM: Magnesium: 2.2 mg/dL (ref 1.7–2.4)

## 2019-06-22 LAB — PHOSPHORUS: Phosphorus: 4.5 mg/dL (ref 2.5–4.6)

## 2019-06-22 MED ORDER — TRAVASOL 10 % IV SOLN
INTRAVENOUS | Status: DC
  Administered 2019-06-23: 01:00:00 via INTRAVENOUS
  Filled 2019-06-22: qty 566.4

## 2019-06-22 MED ORDER — APIXABAN 5 MG PO TABS
5.0000 mg | ORAL_TABLET | Freq: Two times a day (BID) | ORAL | Status: DC
  Administered 2019-06-23: 5 mg via ORAL
  Filled 2019-06-22: qty 1

## 2019-06-22 MED ORDER — PANTOPRAZOLE SODIUM 40 MG PO TBEC
40.0000 mg | DELAYED_RELEASE_TABLET | Freq: Every day | ORAL | Status: DC
  Administered 2019-06-23: 09:00:00 40 mg via ORAL
  Filled 2019-06-22: qty 1

## 2019-06-22 MED ORDER — APIXABAN 5 MG PO TABS
5.0000 mg | ORAL_TABLET | Freq: Once | ORAL | Status: AC
  Administered 2019-06-22: 5 mg via ORAL
  Filled 2019-06-22: qty 1

## 2019-06-22 NOTE — Progress Notes (Signed)
PHARMACY - ADULT TOTAL PARENTERAL NUTRITION CONSULT NOTE  Pharmacy Consult:  TPN Indication: Prolonged Ileus   Patient Measurements: Height: '6\' 1"'  (185.4 cm) Weight: 179 lb 11.2 oz (81.5 kg) IBW/kg (Calculated) : 79.9 TPN AdjBW (KG): 81.5 Body mass index is 23.71 kg/m. Usual Weight: ~200lb  Assessment:  39 YOM presented on 06/02/19 with with syncope and CP.  Found to be COVID+ and treatment of diarrhea led to ileus now has developed pSBO while admitted, currently with NGT on suction.  Has been passing gas and had multiple BM on 8/26.  Mostly NPO since 8/23.   GI: Pre-albumin improved to 33.8 (BL ~14), NGT clamped and to be removed today, LBM 9/1.  PPI IV. Advancing diet to full liquids today  Endo: no hx DM, A1c 6.2%.  CBGs 125-131 Insulin requirements in the past 24 hours: 6 units  Lytes: Lytes wnl  Renal: Na 139, other lytes wnl, SCr 0.88, BUN 26 - UOP 0.5 ml/kg/hr, finasteride  Pulm: stable on RA Cards: CAD/PAF/HTN - SBP 130s, HR 60s,  ASA, Crestor AC: Lovenox for Afib - mild anemia, plts WNL Hepatobil: AST/ALT trending down, alk phos elevated, TG 162 Neuro: prn APAP  ID: COVID+, afebrile, WBC wnl TPN Access: PICC placed 8/27 TPN start date: 06/15/19  Nutritional Goals (per RD rec 8/27): 2205-2470 kCal, 126-152gm protein per day  Current Nutrition:  TPN Advancing to soft diet today   Plan:  Decrease TPN to 40 mL/hr  TPN will provide 56g AA, 134g CHO and 29g ILE for a total of 939 kCal, meeting 42% of patient's needs Electrolytes in TPN: Cl:Ac 1:1 - standard lytes for now Daily multivitamin and trace elements in TPN. Continue sensitive SSI Q4H Advancing to soft diet today. If tolerates, will d/c TPN tomorrow    Albertina Parr, PharmD., BCPS Clinical Pharmacist Clinical phone for 06/22/19 until 5pm: [phone]

## 2019-06-22 NOTE — Progress Notes (Signed)
ANTICOAGULATION CONSULT NOTE - Follow Up Consult  Pharmacy Consult for lovenox > apixaban  Indication: atrial fibrillation  Allergies  Allergen Reactions  . Felodipine Swelling    Legs became swollen  . Niacin Other (See Comments)    Made patient feel flushed    Patient Measurements: Height: 6\' 1"  (185.4 cm) Weight: 179 lb 11.2 oz (81.5 kg) IBW/kg (Calculated) : 79.9 Heparin Dosing Weight: TBW  Vital Signs: Temp: 98.4 F (36.9 C) (09/03 0804) Temp Source: Oral (09/03 0804) BP: 152/83 (09/03 0804) Pulse Rate: 67 (09/03 0804)  Labs: Recent Labs    06/20/19 0540 06/21/19 0400 06/22/19 0315  CREATININE 0.88 0.86 0.88    Estimated Creatinine Clearance: 93.3 mL/min (by C-G formula based on SCr of 0.88 mg/dL).   Medications:  Infusions:  . sodium chloride 10 mL/hr at 06/22/19 0400  . TPN ADULT (ION) 95 mL/hr at 06/22/19 0400  . TPN ADULT (ION)      Assessment: 70 YOF with Afib and CHADSVASc score 3 on apixaban at home. Currently on treatment dose Lovenox. Pharmacy consulted to transition back to home apixaban     SCr remains stable at 0.88. Of note, last dose apixaban was at 1551 yesterday.   Goal of Therapy:  Stroke prevention  Monitor platelets by anticoagulation protocol: Yes   Plan:  Resume home apixaban 5 mg twice daily. First dose today at 1600  Monitor CBC and renal fx   Lovenox d/c'ed   Albertina Parr, PharmD., BCPS Clinical Pharmacist Clinical phone for 06/22/19 until 5pm: [phone]

## 2019-06-22 NOTE — Progress Notes (Signed)
ANTICOAGULATION CONSULT NOTE - Follow Up Consult  Pharmacy Consult for lovenox>apixaban Indication: atrial fibrillation  Allergies  Allergen Reactions  . Felodipine Swelling    Legs became swollen  . Niacin Other (See Comments)    Made patient feel flushed    Patient Measurements: Height: 6\' 1"  (185.4 cm) Weight: 179 lb 11.2 oz (81.5 kg) IBW/kg (Calculated) : 79.9 Heparin Dosing Weight: TBW  Vital Signs: Temp: 98.4 F (36.9 C) (09/03 0804) Temp Source: Oral (09/03 0804) BP: 152/83 (09/03 0804) Pulse Rate: 67 (09/03 0804)  Labs: Recent Labs    06/20/19 0540 06/21/19 0400 06/22/19 0315  CREATININE 0.88 0.86 0.88    Estimated Creatinine Clearance: 93.3 mL/min (by C-G formula based on SCr of 0.88 mg/dL).   Medications:  Infusions:  . sodium chloride 10 mL/hr at 06/22/19 0400  . TPN ADULT (ION) 95 mL/hr at 06/22/19 0400  . TPN ADULT (ION)      Assessment: 80 YOF with Afib and CHADSVASc score 3 on apixaban at home. Pharmacy consulted to transition to IV heparin. Apixaban has been discontinued and the last inpatient apixaban dose was 8/22 at 2100.    Currently on SQ Lovenox 1.5 mg/kg daily. H/H and Plt wnl. SCr wnl and stable.   He is being transition back to soft diet today. Dr [place] Pink would like to resume his apixaban and stop is lovenox. Last dose 9/2 at approximately 4pm.   Age<80, wt>60kg, scr<1.5   Goal of Therapy:  Stroke prevention  Monitor platelets by anticoagulation protocol: Yes   Plan:  Dc lovenox Apixaban 5mg  BID Monitor for bleeding  Onnie Boer, PharmD, BCIDP, AAHIVP, CPP Infectious Disease Pharmacist 06/22/2019 11:04 AM

## 2019-06-22 NOTE — Progress Notes (Signed)
[facility] PROGRESS NOTE    Progress Note  [name]  [id number] DOB: 1950-11-24 DOA: 06/05/2019 PCP: [name], FNP     Brief Narrative:   [name] is an 68 y.o. male past medical history significant for coronary artery disease, paroxysmal atrial fibrillation hypertension who has been feeling well until Thursday when he was sitting in his golf cart and suddenly started getting lightheaded.  As per witnesses he passed out and woke up immediately felt some chest tightness.  He describes as bricks sitting on his chest.  Work told him his heart rate was slow and fast but does not remember the rate, he took 2 nitroglycerin that relieved the pain.  In the ER he was found to have a COVID-19 infection and developed partial small bowel obstruction in-house  Assessment/Plan:   Small bowel obstruction Patient was initially seen by general surgery while have he was in the other facility.  NG tube was left in place.  Patient had a prolonged ileus.  He was started on TPN.   Patient has improved in the last 4 to 5 days.  He has been having bowel movements and passing gas.  His abdomen is benign to examination.  He is not experiencing any pain. He has been tolerating liquid diet.  Today he will be transitioned to soft diet.  NG tube was pulled out yesterday. If he tolerates soft diet today then TPN can be discontinued this evening.     COVID-19 virus infection: Patient's chest x-ray was unremarkable.  He actually never developed any respiratory symptoms from COVID-19.  Remained stable.  Syncope likely due to orthostatic hypotension: Likely due to orthostatic hypotension from volume loss.  Appears to have resolved.  Physical therapy is following.    Acute kidney injury: Likely prerenal azotemia, resolved with IV fluid hydration.  Transaminitis Elevated AST and ALT most likely due to COVID-19.  Seems to be improving.  Will need outpatient monitoring.  Paroxismal atrial  fibrillation: CHADS VASC 2 score > 2.  Patient was on Eliquis prior to hospitalization.  Initially placed on IV heparin.  Now on Lovenox.  Change him to Eliquis today.  Chest pressure, resolved Likely due to atrial fibrillation in the setting of orthostasis.  Currently asymptomatic.  Outpatient follow-up with primary care provider and or cardiology.  Nutrition status: Currently on TPN.  Diet being advanced.  If he tolerates soft diet today, can stop his TPN.  Essential hypertension: Continue to hold antihypertensive medication.   DVT prophylaxis: lovenxo Family Communication:none Disposition Plan/Barrier to D/C: Once tolerating a diet. Code Status: Full code   IV Access:    Peripheral IV   Procedures and diagnostic studies:   No results found.   Medical Consultants:    None.  Anti-Infectives:  None  Subjective:   Patient states that he feels much better.  Denies any abdominal symptoms.  Tolerating his full liquids.  Looking forward to soft diet.  Having bowel movements.  Ambulating.  Objective:    Vitals:   06/21/19 1659 06/21/19 2040 06/22/19 0345 06/22/19 0804  BP: (!) 150/80 (!) 143/85  (!) 152/83  Pulse: 71 77  67  Resp: 18 18 18 20   Temp:  98.3 F (36.8 C) 97.9 F (36.6 C) 98.4 F (36.9 C)  TempSrc: Oral Oral Oral Oral  SpO2: 97% 94%  98%  Weight:      Height:       SpO2: 98 % O2 Flow Rate (L/min): 2 L/min  Intake/Output Summary (Last 24 hours) at 06/22/2019 1015 Last data filed at 06/22/2019 0400 Gross per 24 hour  Intake 2618.34 ml  Output 950 ml  Net 1668.34 ml   Filed Weights   06/12/19 0531 06/15/19 1128 06/21/19 1553  Weight: 89.2 kg 84.3 kg 81.5 kg    Exam:  General appearance: Awake alert.  In no distress Resp: Clear to auscultation bilaterally.  Normal effort Cardio: S1-S2 is normal regular.  No S3-S4.  No rubs murmurs or bruit GI: Abdomen remains soft.  Nontender nondistended.  Bowel sounds are present normal.  No masses  organomegaly.   Extremities: No edema.  Full range of motion of lower extremities. Neurologic: Alert and oriented x3.  No focal neurological deficits.     Data Reviewed:    Labs: Basic Metabolic Panel: Recent Labs  Lab 06/16/19 0915 06/16/19 1100 06/17/19 0600 06/18/19 0445 06/19/19 0430 06/20/19 0540 06/21/19 0400 06/22/19 0315  NA 136 140 140 139 138 135 138 139  K 4.7 4.1 4.2 4.4 4.7 4.8 4.7 4.8  CL 105 105 108 107 107 106 105 106  CO2 25 24 23 25 24 23 23 23   GLUCOSE 301* 123* 113* 100* 97 128* 118* 120*  BUN 16 16 18 19 21  24* 24* 26*  CREATININE 0.87 0.88 0.86 0.78 0.81 0.88 0.86 0.88  CALCIUM 8.0* 8.7* 8.6* 8.9 9.2 9.2 9.3 9.4  MG 2.0 2.2 2.1  --  2.4  --   --  2.2  PHOS 2.7 3.0 3.3  --  4.2  --   --  4.5   GFR Estimated Creatinine Clearance: 93.3 mL/min (by C-G formula based on SCr of 0.88 mg/dL). Liver Function Tests: Recent Labs  Lab 06/16/19 0915 06/19/19 0430 06/22/19 0315  AST 84* 99* 54*  ALT 107* 168* 128*  ALKPHOS 123 150* 165*  BILITOT 1.0 0.6 0.5  PROT 6.2* 6.9 7.0  ALBUMIN 2.7* 3.3* 3.5     Lab Results  Component Value Date   SARSCOV2NAA POSITIVE (A) 06/05/2019    CBC: Recent Labs  Lab 06/16/19 0550 06/17/19 0600 06/18/19 0445 06/19/19 0430  WBC 3.5* 3.9* 4.6 5.6  NEUTROABS 2.0  --   --  3.6  HGB 12.2* 12.8* 12.9* 13.3  HCT 37.9* 39.2 39.1 40.7  MCV 95.2 90.7 90.5 90.6  PLT 292 289 271 266   CBG: Recent Labs  Lab 06/21/19 1656 06/21/19 2005 06/22/19 0017 06/22/19 0433 06/22/19 0803  GLUCAP 135* 129* 115* 125* 127*   Lipid Profile: No results for input(s): CHOL, HDL, LDLCALC, TRIG, CHOLHDL, LDLDIRECT in the last 72 hours. Sepsis Labs: Recent Labs  Lab 06/16/19 0550 06/17/19 0600 06/18/19 0445 06/19/19 0430  WBC 3.5* 3.9* 4.6 5.6   Microbiology No results found for this or any previous visit (from the past 240 hour(s)).   Medications:   . aspirin EC  81 mg Oral QHS  . Chlorhexidine Gluconate Cloth  6 each  Topical Daily  . enoxaparin (LOVENOX) injection  120 mg Subcutaneous Q24H  . feeding supplement (ENSURE ENLIVE)  237 mL Oral TID BM  . finasteride  5 mg Oral Daily  . insulin aspart  0-9 Units Subcutaneous Q4H  . pantoprazole (PROTONIX) IV  40 mg Intravenous Q24H  . rosuvastatin  40 mg Oral Daily  . sodium chloride flush  10-40 mL Intracatheter Q12H   Continuous Infusions: . sodium chloride 10 mL/hr at 06/22/19 0400  . TPN ADULT (ION) 95 mL/hr at 06/22/19 0400  LOS: 16 days   [employer]  [facility]  06/22/2019, 10:15 AM

## 2019-06-22 NOTE — Progress Notes (Signed)
Crystal, patient's daughter, updated on patient's status and that he is tolerating solid foods well.  No acute distress present.  See chart for nsg assessment.

## 2019-06-23 LAB — GLUCOSE, CAPILLARY
Glucose-Capillary: 176 mg/dL — ABNORMAL HIGH (ref 70–99)
Glucose-Capillary: 130 mg/dL — ABNORMAL HIGH (ref 70–99)
Glucose-Capillary: 128 mg/dL — ABNORMAL HIGH (ref 70–99)
Glucose-Capillary: 104 mg/dL — ABNORMAL HIGH (ref 70–99)

## 2019-06-23 MED ORDER — POLYETHYLENE GLYCOL 3350 17 G PO PACK: 17.0000 g | PACK | Freq: Every day | ORAL | 0 refills | Status: DC | PRN

## 2019-06-23 MED ORDER — DOCUSATE SODIUM 100 MG PO CAPS: 100.0000 mg | ORAL_CAPSULE | Freq: Every day | ORAL | 0 refills | Status: DC | PRN

## 2019-06-23 NOTE — Progress Notes (Signed)
Called wife updated on pt status and DC. Reviewed AVS with pt, pt verbilzed understanding. Provided pt with thermometer and work note from MD

## 2019-06-23 NOTE — Discharge Instructions (Signed)
Bowel Obstruction A bowel obstruction is a blockage in the small or large bowel. The bowel, which is also called the intestine, is a long, slender tube that connects the stomach to the anus. When a person eats and drinks, food and fluids go from the mouth to the stomach to the small bowel. This is where most of the nutrients in the food and fluids are absorbed. After the small bowel, material passes through the large bowel for further absorption until any leftover material leaves the body as stool through the anus during a bowel movement. A bowel obstruction will prevent food and fluids from passing through the bowel as they normally do during digestion. The bowel can become partially or completely blocked. If this condition is not treated, it can be dangerous because the bowel could rupture. What are the causes? Common causes of this condition include:  Scar tissue (adhesions) from previous surgery or treatment with high-energy X-rays (radiation).  Recent surgery. This may cause the movements of the bowel to slow down and cause food to block the intestine.  Inflammatory bowel disease, such as Crohn's disease or diverticulitis.  Growths or tumors.  A bulging organ (hernia).  Twisting of the bowel (volvulus).  A foreign body.  Slipping of a part of the bowel into another part (intussusception). What are the signs or symptoms? Symptoms of this condition include:  Pain in the abdomen. Depending on the degree of obstruction, pain may be: ? Mild or severe. ? Dull cramping or sharp pain. ? In one area or in the entire abdomen.  Nausea and vomiting. Vomit may be greenish or a yellow bile color.  Bloating in the abdomen.  Difficulty passing stool (constipation).  Lack of passing gas.  Frequent belching.  Diarrhea. This may occur if the obstruction is partial and runny stool is able to leak around the obstruction. How is this diagnosed? This condition may be diagnosed based  on:  A physical exam.  Medical history.  Imaging tests of the abdomen or pelvis, such as X-ray or CT scan.  Blood or urine tests. How is this treated? Treatment for this condition depends on the cause and severity of the problem. Treatment may include:  Fluids and pain medicines that are given through an IV. Your health care provider may instruct you not to eat or drink if you have nausea or vomiting.  Eating a simple diet. You may be asked to consume a clear liquid diet for several days. This allows the bowel to rest.  Placement of a small tube (nasogastric tube) into the stomach. This will relieve pain, discomfort, and nausea by removing blocked air and fluids from the stomach. It can also help the obstruction clear up faster.  Surgery. This may be required if other treatments do not work. Surgery may be required for: ? Bowel obstruction from a hernia. This can be an emergency procedure. ? Scar tissue that causes frequent or severe obstructions. Follow these instructions at home: Medicines  Take over-the-counter and prescription medicines only as told by your health care provider.  If you were prescribed an antibiotic medicine, take it as told by your health care provider. Do not stop taking the antibiotic even if you start to feel better. General instructions  Follow instructions from your health care provider about eating restrictions. You may need to avoid solid foods and consume only clear liquids until your condition improves.  Return to your normal activities as told by your health care provider. Ask your health  care provider what activities are safe for you.  Avoid sitting for a long time without moving. Get up to take short walks every 1-2 hours. This is important to improve blood flow and breathing. Ask for help if you feel weak or unsteady.  Keep all follow-up visits as told by your health care provider. This is important. How is this prevented? After having a bowel  obstruction, you are more likely to have another. You may do the following things to prevent another obstruction:  If you have a long-term (chronic) disease, pay attention to your symptoms and contact your health care provider if you have questions or concerns.  Avoid becoming constipated. To prevent or treat constipation, your health care provider may recommend that you: ? Drink enough fluid to keep your urine pale yellow. ? Take over-the-counter or prescription medicines. ? Eat foods that are high in fiber, such as beans, whole grains, and fresh fruits and vegetables. ? Limit foods that are high in fat and processed sugars, such as fried or sweet foods.  Stay active. Exercise for 30 minutes or more, 5 or more days each week. Ask your health care provider which exercises are safe for you.  Avoid stress. Find ways to reduce stress, such as meditation, exercise, or taking time for activities that relax you.  Instead of eating three large meals each day, eat three small meals with three small snacks.  Work with a Microbiologist to make a healthy meal plan that works for you.  Do not use any products that contain nicotine or tobacco, such as cigarettes and e-cigarettes. If you need help quitting, ask your health care provider. Contact a health care provider if you:  Have a fever.  Have chills. Get help right away if you:  Have increased pain or cramping.  Vomit blood.  Have uncontrolled vomiting or nausea.  Cannot drink fluids because of vomiting or pain.  Become confused.  Begin feeling very thirsty (dehydrated).  Have severe bloating.  Feel extremely weak or you faint. Summary  A bowel obstruction is a blockage in the small or large bowel.  A bowel obstruction will prevent food and fluids from passing through the bowel as they normally do during digestion.  Treatment for this condition depends on the cause and severity of the problem. It may include fluids and pain medicines  through an IV, a simple diet, a nasogastric tube, or surgery.  Follow instructions from your health care provider about eating restrictions. You may need to avoid solid foods and consume only clear liquids until your condition improves. This information is not intended to replace advice given to you by your health care provider. Make sure you discuss any questions you have with your health care provider. Document Released: 12/22/2005 Document Revised: 11/11/2018 Document Reviewed: 02/16/2018 Elsevier Patient Education  2020 [place] on my medicine - ELIQUIS (apixaban)  This medication education was reviewed with me or my healthcare representative as part of my discharge preparation.  The pharmacist that spoke with me during my hospital stay was:  Onnie Boer, RPH-CPP  Why was Eliquis prescribed for you? Eliquis was prescribed for you to reduce the risk of a blood clot forming that can cause a stroke if you have a medical condition called atrial fibrillation (a type of irregular heartbeat).  What do You need to know about Eliquis ? Take your Eliquis TWICE DAILY - one tablet in the morning and one tablet in the evening with or without food.  If you have difficulty swallowing the tablet whole please discuss with your pharmacist how to take the medication safely.  Take Eliquis exactly as prescribed by your doctor and DO NOT stop taking Eliquis without talking to the doctor who prescribed the medication.  Stopping may increase your risk of developing a stroke.  Refill your prescription before you run out.  After discharge, you should have regular check-up appointments with your healthcare provider that is prescribing your Eliquis.  In the future your dose may need to be changed if your kidney function or weight changes by a significant amount or as you get older.  What do you do if you miss a dose? If you miss a dose, take it as soon as you remember on the same day and  resume taking twice daily.  Do not take more than one dose of ELIQUIS at the same time to make up a missed dose.  Important Safety Information A possible side effect of Eliquis is bleeding. You should call your healthcare provider right away if you experience any of the following: ? Bleeding from an injury or your nose that does not stop. ? Unusual colored urine (red or dark brown) or unusual colored stools (red or black). ? Unusual bruising for unknown reasons. ? A serious fall or if you hit your head (even if there is no bleeding).  Some medicines may interact with Eliquis and might increase your risk of bleeding or clotting while on Eliquis. To help avoid this, consult your healthcare provider or pharmacist prior to using any new prescription or non-prescription medications, including herbals, vitamins, non-steroidal anti-inflammatory drugs (NSAIDs) and supplements.  This website has more information on Eliquis (apixaban): http://www.eliquis.com/eliquis/home

## 2019-06-23 NOTE — Discharge Summary (Signed)
[facility]  Physician Discharge Summary   Patient ID: [name] MRN: [id number] DOB/AGE: April 28, 1951 68 y.o.  Admit date: 06/05/2019 Discharge date: 06/23/2019  PCP: [name], FNP  DISCHARGE DIAGNOSES:  Small bowel obstruction, resolved COVID-19 virus infection, stable Orthostatic hypotension, resolved Acute kidney injury, resolved Transaminitis, improved Paroxysmal atrial fibrillation Essential hypertension   RECOMMENDATIONS FOR OUTPATIENT FOLLOW UP: 1. Patient has an outpatient appointment with his primary care provider in a few days 2. PCP to please check LFTs in the next 1 to 2 weeks.    Home Health: None Equipment/Devices: None  CODE STATUS: Full code  DISCHARGE CONDITION: fair  Diet recommendation: Soft diet for the next 2 weeks  INITIAL HISTORY: [name] is an 68 y.o. male past medical history significant for coronary artery disease, paroxysmal atrial fibrillation hypertension who has been feeling well until Thursday when he was sitting in his golf cart and suddenly started getting lightheaded.  As per witnesses he passed out and woke up immediately felt some chest tightness.  He describes as bricks sitting on his chest.  Work told him his heart rate was slow and fast but does not remember the rate, he took 2 nitroglycerin that relieved the pain.  In the ER he was found to have a COVID-19 infection and developed partial small bowel obstruction in-house  Consultations:  General surgery  Procedures:  PICC line placement  NG tube placement   HOSPITAL COURSE:   Small bowel obstruction Patient was initially seen by general surgery.  NG tube was placed.  Patient underwent imaging studies.  He was managed conservatively.  He started improving.  NGT was subsequently clamped.  Clear liquid diet was provided.  Subsequently the NG tube was removed.  Patient has done well the last 3 to 4 days.  He has been ambulating.  He has had multiple  bowel movements.  He is tolerating a soft diet.  TPN discontinued this morning.  Okay for discharge today.   COVID-19 virus infection: Patient's chest x-ray was unremarkable.  He actually never developed any respiratory symptoms from COVID-19.   Syncope likely due to orthostatic hypotension: Likely due to orthostatic hypotension from volume loss.  Appears to have resolved.  Has been ambulating without difficulty.  Acute kidney injury: Likely prerenal azotemia, resolved with IV fluid hydration.  Transaminitis Elevated AST and ALT most likely due to COVID-19.  Seems to be improving.  Will need outpatient monitoring.  Paroxismal atrial fibrillation: CHADS VASC 2 score > 2.  Patient was on Eliquis prior to hospitalization.  Initially placed on IV heparin.    Then transitioned to Lovenox.  Then back on Eliquis.  Chest pressure, resolved Likely due to atrial fibrillation in the setting of orthostasis.    Symptoms resolved.  Outpatient follow-up with PCP.    Nutrition status: He did require TPN while he was in the hospital here.  Now he is back on oral diet which he is tolerating well.  Essential hypertension: Continue home medications.    Overall stable.  Okay for discharge home today.   PERTINENT LABS:  The results of significant diagnostics from this hospitalization (including imaging, microbiology, ancillary and laboratory) are listed below for reference.      Labs: Basic Metabolic Panel: Recent Labs  Lab 06/17/19 0600 06/18/19 0445 06/19/19 0430 06/20/19 0540 06/21/19 0400 06/22/19 0315  NA 140 139 138 135 138 139  K 4.2 4.4 4.7 4.8 4.7 4.8  CL 108 107 107 106 105 106  CO2 23 25 24 23 23 23   GLUCOSE 113* 100* 97 128* 118* 120*  BUN 18 19 21  24* 24* 26*  CREATININE 0.86 0.78 0.81 0.88 0.86 0.88  CALCIUM 8.6* 8.9 9.2 9.2 9.3 9.4  MG 2.1  --  2.4  --   --  2.2  PHOS 3.3  --  4.2  --   --  4.5   Liver Function Tests: Recent Labs  Lab 06/19/19 0430  06/22/19 0315  AST 99* 54*  ALT 168* 128*  ALKPHOS 150* 165*  BILITOT 0.6 0.5  PROT 6.9 7.0  ALBUMIN 3.3* 3.5   CBC: Recent Labs  Lab 06/17/19 0600 06/18/19 0445 06/19/19 0430  WBC 3.9* 4.6 5.6  NEUTROABS  --   --  3.6  HGB 12.8* 12.9* 13.3  HCT 39.2 39.1 40.7  MCV 90.7 90.5 90.6  PLT 289 271 266   BNP: BNP (last 3 results) Recent Labs    06/14/19 0410 06/15/19 0300 06/16/19 0550  BNP 32.8 73.6 74.1    CBG: Recent Labs  Lab 06/22/19 1149 06/22/19 1552 06/22/19 2020 06/23/19 0431 06/23/19 0726  GLUCAP 122* 119* 176* 130* 128*     IMAGING STUDIES Dg Chest 2 View  Result Date: 06/05/2019 CLINICAL DATA:  Syncope x2 over the past week. History of atrial fibrillation. EXAM: CHEST - 2 VIEW COMPARISON:  Single-view of the chest 05/30/2018. FINDINGS: Lungs clear. Heart size normal. Aortic atherosclerosis noted. No pneumothorax or pleural fluid. No acute or focal bony abnormality. IMPRESSION: No acute disease. Atherosclerosis. Electronically Signed   By: Inge Rise M.D.   On: 06/05/2019 15:50   Dg Abd 1 View  Result Date: 06/20/2019 CLINICAL DATA:  Ileus EXAM: ABDOMEN - 1 VIEW COMPARISON:  Portable exam 0725 hours compared to 06/19/2019 FINDINGS: Tip of nasogastric tube projects over distal descending duodenum. Nonobstructive bowel gas pattern. Gas identified within nondistended large and small bowel loops. No bowel wall thickening or dilatation. No urinary tract calcification or acute osseous findings. IMPRESSION: Nonobstructive bowel gas pattern. Electronically Signed   By: Lavonia Dana M.D.   On: 06/20/2019 08:57   Dg Abd 1 View  Result Date: 06/19/2019 CLINICAL DATA:  Abdominal pain EXAM: ABDOMEN - 1 VIEW COMPARISON:  06/18/2019 FINDINGS: Again visualized is a enteric tube which terminates within the descending portion of the duodenum. Persistent ileus type bowel gas pattern. No progressive small-bowel dilation compared to prior. No gross free intraperitoneal air.  IMPRESSION: Persistent ileus-type bowel gas pattern, similar to prior. Electronically Signed   By: Davina Poke M.D.   On: 06/19/2019 10:38   Dg Abd 1 View  Result Date: 06/18/2019 CLINICAL DATA:  Ileus EXAM: ABDOMEN - 1 VIEW COMPARISON:  06/17/2019 FINDINGS: Orogastric or nasogastric tube tip is in the descending duodenum. Persistent moderate ileus pattern affecting the small and large bowel. No worsening. No sign of free air. IMPRESSION: Persistent moderate ileus pattern. Electronically Signed   By: Nelson Chimes M.D.   On: 06/18/2019 11:40   Dg Abd 1 View  Result Date: 06/17/2019 CLINICAL DATA:  Abdominal pain. EXAM: ABDOMEN - 1 VIEW COMPARISON:  06/16/2019 FINDINGS: Nasogastric terminates at the descending duodenum. Gas within normal caliber colon. Gas-filled small bowel loops measure up to 4.4 cm today versus up to 4.7 cm on the prior. No gross free intraperitoneal air. IMPRESSION: Slight improvement in small bowel distension, favoring adynamic ileus. Electronically Signed   By: Abigail Miyamoto M.D.   On: 06/17/2019 10:53   Dg Abd 1 View  Result Date: 06/15/2019 CLINICAL DATA:  Abdominal pain EXAM: ABDOMEN - 1 VIEW COMPARISON:  Yesterday FINDINGS: Gaseous distension of small and large bowel. The enteric tube tip is at the distal descending duodenum. No concerning mass effect or gas collection. IMPRESSION: 1. Unchanged gaseous bowel distension affecting small bowel more than colon. 2. Enteric tube with tip at the mid duodenum. Electronically Signed   By: Monte Fantasia M.D.   On: 06/15/2019 08:51   Dg Abd 1 View  Result Date: 06/13/2019 CLINICAL DATA:  Evaluation for ileus/HBO. EXAM: ABDOMEN - 1 VIEW COMPARISON:  06/12/2019. FINDINGS: NG tube noted with its tip over the distal stomach/proximal duodenum. Persistent dilated loops of small bowel again noted consistent with small bowel obstruction. Small-bowel obstruction may be partial. Prominent adynamic ileus could also present this fashion.  Colonic gas pattern is normal. No free air is identified. No acute bony abnormality. IMPRESSION: NG tube noted with tip over the distal stomach/proximal duodenum. Persistent prominent small bowel distention without interim change. Findings are most consistent with small bowel obstruction. Electronically Signed   By: Marcello Moores  Register   On: 06/13/2019 10:15   Ct Abdomen Pelvis W Contrast  Result Date: 06/11/2019 CLINICAL DATA:  Abdominal pain, gastroenteritis or colitis, COVID-19 positive EXAM: CT ABDOMEN AND PELVIS WITH CONTRAST TECHNIQUE: Multidetector CT imaging of the abdomen and pelvis was performed using the standard protocol following bolus administration of intravenous contrast. CONTRAST:  28mL OMNIPAQUE IOHEXOL 300 MG/ML SOLN, additional oral enteric contrast COMPARISON:  12/13/2018 FINDINGS: Lower chest: Small right pleural effusion and associated atelectasis. Scattered ground-glass opacities primarily in the included left lower lung. Coronary artery calcifications. Hepatobiliary: No solid liver abnormality is seen. No gallstones, gallbladder wall thickening, or biliary dilatation. Pancreas: Unremarkable. No pancreatic ductal dilatation or surrounding inflammatory changes. Spleen: Normal in size without significant abnormality. Adrenals/Urinary Tract: Adrenal glands are unremarkable. Kidneys are normal, without renal calculi, solid lesion, or hydronephrosis. Bladder is unremarkable. Stomach/Bowel: Stomach is within normal limits. Appendix appears normal. The small bowel is diffusely distended to the terminal ileum, largest loops measuring 4.3 cm, the final loop of terminal ileum decompressed with fluid in the colon to the rectum. Sigmoid diverticulosis. Esophagogastric tube is positioned with tip in the duodenum. Vascular/Lymphatic: Aortic atherosclerosis. No enlarged abdominal or pelvic lymph nodes. Reproductive: No mass or other significant abnormality. Other: No abdominal wall hernia or abnormality.  Small volume ascites. Musculoskeletal: No acute or significant osseous findings. IMPRESSION: 1. The small bowel is diffusely distended to the terminal ileum, largest loops measuring 4.3 cm, the final loop of terminal ileum decompressed with fluid in the colon to the rectum. Findings favor ileus without definite findings to suggest obstructed transition point of the terminal ileum. Consider follow-up radiographs to observe for transit of oral contrast to the colon. 2.  Small volume ascites, likely reactive. 3. Esophagogastric tube is positioned with tip in the duodenum. Consider slight retraction for intragastric positioning. Electronically Signed   By: Eddie Candle M.D.   On: 06/11/2019 18:45   Dg Abd Portable 1v  Result Date: 06/16/2019 CLINICAL DATA:  Ileus EXAM: PORTABLE ABDOMEN - 1 VIEW COMPARISON:  06/15/2019 FINDINGS: Diffuse small bowel dilatation unchanged. Nondilated gas-filled colon unchanged. NG tube passes through the stomach with the tip in the second portion of the duodenum, unchanged. IMPRESSION: Moderate small bowel dilatation unchanged. Possible small bowel obstruction versus ileus. NG in the duodenum. Electronically Signed   By: Franchot Gallo M.D.   On: 06/16/2019 08:46   Dg Abd Portable 1v  Result Date: 06/14/2019 CLINICAL DATA:  Small bowel obstruction. EXAM: PORTABLE ABDOMEN - 1 VIEW COMPARISON:  06/13/2019 FINDINGS: Nasogastric tube appears to be extending into the duodenum and likely in the descending portion of the duodenum. There continues to be dilated loops of small bowel throughout the abdomen. The degree of small bowel distension is similar to the previous examination. Small amount of gas in the stomach. Limited evaluation for free air on this supine image. IMPRESSION: 1. No change in the appearance of the small bowel obstruction. 2. Nasogastric tube tip is in the descending duodenum. Electronically Signed   By: Markus Daft M.D.   On: 06/14/2019 09:05   Dg Abd Portable 1v-small  Bowel Obstruction Protocol-initial, 8 Hr Delay  Result Date: 06/12/2019 CLINICAL DATA:  Small bowel obstruction, 8 hour delay. EXAM: PORTABLE ABDOMEN - 1 VIEW COMPARISON:  Abdominal radiograph from earlier today. CT abdomen/pelvis from 1 day prior. FINDINGS: Enteric tube terminates in medial right abdomen, probably within the descending duodenum. There is persistent mild-to-moderate small bowel dilatation throughout the abdomen bilaterally, not substantially changed. There is possible passage of dilute oral contrast into the colon, difficult to determine given the dilution. No evidence of pneumatosis or pneumoperitoneum. No radiopaque nephrolithiasis. Excreted contrast is seen in the bladder. IMPRESSION: Persistent mild-to-moderate small bowel dilatation throughout the abdomen, not appreciably changed. Findings could represent adynamic ileus or partial distal small bowel obstruction. Enteric tube terminates in the medial right abdomen, probably within the descending duodenum. Electronically Signed   By: Ilona Sorrel M.D.   On: 06/12/2019 17:27   Dg Abd Portable 1v-small Bowel Protocol-position Verification  Result Date: 06/12/2019 CLINICAL DATA:  Small bowel obstruction EXAM: PORTABLE ABDOMEN - 1 VIEW COMPARISON:  June 11, 2019 FINDINGS: Nasogastric tube tip and side port are in the proximal duodenum. There remain loops of dilated bowel consistent with either ileus or a degree of bowel obstruction. No free air. Mild stool present in colon IMPRESSION: Nasogastric tube tip and side port in proximal duodenum. Loops mildly dilated bowel remain without progression. No free air. Electronically Signed   By: Lowella Grip III M.D.   On: 06/12/2019 08:04   Dg Abd Portable 1v  Result Date: 06/11/2019 CLINICAL DATA:  Small bowel obstruction EXAM: PORTABLE ABDOMEN - 1 VIEW COMPARISON:  06/09/2019 FINDINGS: There is persistent significant dilatation of small bowel loops, disproportionate to the dilatation of  large bowel loops and consistent with small bowel obstruction. The degree of bowel distention has increased since previous exam, a small polyps measuring up to 5.3 centimeters. No evidence for free intraperitoneal air. IMPRESSION: Persistent small bowel obstruction. Increased caliber of small bowel loops. Electronically Signed   By: Nolon Nations M.D.   On: 06/11/2019 08:52   Dg Abd Portable 1v  Result Date: 06/09/2019 CLINICAL DATA:  Encounter for abdominal pian and tightness that began yesterday night EXAM: PORTABLE ABDOMEN - 1 VIEW COMPARISON:  CT 12/13/2018 FINDINGS: There is disproportionate dilatation of small bowel relative to large bowel loops. No evidence for free intraperitoneal air on these supine views performed. IMPRESSION: Early or partial small bowel obstruction. Electronically Signed   By: Nolon Nations M.D.   On: 06/09/2019 17:23   Dg Addison Bailey G Tube Plc W/fl W/rad  Result Date: 06/11/2019 CLINICAL DATA:  Small-bowel distention. Patient requires gastroenteric decompression. Multiple attempts to place NG tube on the floor were unsuccessful and radiology placement has been requested. EXAM: NASO G TUBE PLACEMENT WITH FL AND WITH RAD CONTRAST:  None. FLUOROSCOPY TIME:  Fluoroscopy Time:  0 minutes and 6 seconds. Radiation Exposure Index (if provided by the fluoroscopic device): 3.3 mGy Number of Acquired Spot Images: 1 COMPARISON:  None. FINDINGS: The patient's right nostril was anesthetized with viscous lidocaine. NG tube tip was coated with surgilube. NG tube was easily advanced through the right nostril into the stomach on the first attempt. NG tube tip position was confirmed with a spot fluoro image. IMPRESSION: NG tube is ready to use with tip position in the distal stomach. Electronically Signed   By: Misty Stanley M.D.   On: 06/11/2019 19:36   [place] Ekg Site Rite  Result Date: 06/15/2019 If Site Rite image not attached, placement could not be confirmed due to current cardiac  rhythm.   DISCHARGE EXAMINATION: Vitals:   06/22/19 2000 06/22/19 2322 06/23/19 0430 06/23/19 0728  BP: (!) 147/73 116/75 (!) 142/71 129/82  Pulse: 74  65   Resp: 18 16    Temp: 98.2 F (36.8 C) 98 F (36.7 C) 98.3 F (36.8 C) 98 F (36.7 C)  TempSrc: Oral Oral Oral Oral  SpO2: 97%     Weight:      Height:       General appearance: Awake alert.  In no distress Resp: Clear to auscultation bilaterally.  Normal effort Cardio: S1-S2 is normal regular.  No S3-S4.  No rubs murmurs or bruit GI: Abdomen is soft.  Nontender nondistended.  Bowel sounds are present normal.  No masses organomegaly Extremities: No edema.  Full range of motion of lower extremities. Neurologic: Alert and oriented x3.  No focal neurological deficits.    DISPOSITION: Home  Discharge Instructions    Call MD for:  difficulty breathing, headache or visual disturbances   Complete by: As directed    Call MD for:  extreme fatigue   Complete by: As directed    Call MD for:  persistant dizziness or light-headedness   Complete by: As directed    Call MD for:  persistant nausea and vomiting   Complete by: As directed    Call MD for:  severe uncontrolled pain   Complete by: As directed    Call MD for:  temperature >100.4   Complete by: As directed    Discharge instructions   Complete by: As directed    Please be sure to follow-up with your primary care provider.  Eat soft diet for the next 2 weeks.  Be sure to take stool softeners daily.  Take laxatives as needed if you do not have a bowel movement by evening time.  Seek attention if your symptoms recur.  You were cared for by a hospitalist during your hospital stay. If you have any questions about your discharge medications or the care you received while you were in the hospital after you are discharged, you can call the unit and asked to speak with the hospitalist on call if the hospitalist that took care of you is not available. Once you are discharged, your  primary care physician will handle any further medical issues. Please note that NO REFILLS for any discharge medications will be authorized once you are discharged, as it is imperative that you return to your primary care physician (or establish a relationship with a primary care physician if you do not have one) for your aftercare needs so that they can reassess your need for medications and monitor your lab values. If you do not have a primary care physician, you can call [phone] for a physician referral.  Increase activity slowly   Complete by: As directed         Allergies as of 06/23/2019      Reactions   Felodipine Swelling   Legs became swollen   Niacin Other (See Comments)   Made patient feel flushed      Medication List    TAKE these medications   amLODipine 10 MG tablet Commonly known as: NORVASC Take 1 tablet by mouth daily. Needs visit for future refills. What changed:   how much to take  how to take this  when to take this  additional instructions   aspirin 81 MG tablet Take 81 mg by mouth at bedtime.   b complex vitamins capsule Take 1 capsule by mouth daily.   docusate sodium 100 MG capsule Commonly known as: Colace Take 1 capsule (100 mg total) by mouth daily as needed.   doxazosin 2 MG tablet Commonly known as: CARDURA Take 1 tablet (2 mg total) by mouth daily. What changed: when to take this   Eliquis 5 MG Tabs tablet Generic drug: apixaban TAKE 1 TABLET BY MOUTH TWO  TIMES DAILY What changed: how much to take   ezetimibe 10 MG tablet Commonly known as: ZETIA Take 1 tablet (10 mg total) by mouth daily.   finasteride 5 MG tablet Commonly known as: PROSCAR Take 5 mg by mouth daily.   gabapentin 300 MG capsule Commonly known as: NEURONTIN Take 1 tablet in the morning and 2 tablet at bedtime What changed:   how much to take  how to take this  when to take this  additional instructions   hydrochlorothiazide 50 MG tablet Commonly  known as: HYDRODIURIL Take 0.5 tablets (25 mg total) by mouth daily. Needs visit for future refills. What changed: additional instructions   isosorbide mononitrate 30 MG 24 hr tablet Commonly known as: IMDUR Take 1 tablet (30 mg total) by mouth daily. Needs visit for future refills. What changed: additional instructions   metoprolol tartrate 25 MG tablet Commonly known as: LOPRESSOR Take 1 tablet (25 mg total) by mouth daily as needed (as needed for palpitations/afib.).   nitroGLYCERIN 0.4 MG SL tablet Commonly known as: NITROSTAT Place 1 tablet (0.4 mg total) under the tongue every 5 (five) minutes as needed. Call 9-1-1 if need more than 2.   pantoprazole 40 MG tablet Commonly known as: PROTONIX TAKE 1 TABLET BY MOUTH  EVERY MORNING. What changed:   how much to take  how to take this  when to take this  additional instructions   polyethylene glycol 17 g packet Commonly known as: MIRALAX / GLYCOLAX Take 17 g by mouth daily as needed for mild constipation.   potassium chloride 10 MEQ tablet Commonly known as: K-DUR TAKE 1 TABLET(10 MEQ) BY MOUTH DAILY What changed:   how much to take  how to take this  when to take this  additional instructions   ramipril 10 MG capsule Commonly known as: ALTACE TAKE 1 CAPSULE BY MOUTH  TWICE DAILY   rosuvastatin 40 MG tablet Commonly known as: CRESTOR Take 1 tablet (40 mg total) by mouth daily. Needs visit for future refills. What changed: additional instructions   tiZANidine 2 MG tablet Commonly known as: ZANAFLEX Take one tablet at bedtime as needed for pain, if tolerating, ok to take twice daily as needed What changed:   how much to take  how to take this  when to take this  reasons to take this  Follow-up Information    [name], FNP Follow up.   Specialty: Internal Medicine Contact information: [place] [address] [phone]           TOTAL DISCHARGE  TIME: 110 minutes  [place]  [facility] Pager on [email or web address]  06/23/2019, 11:24 AM

## 2019-06-27 ENCOUNTER — Other Ambulatory Visit: Payer: Self-pay | Admitting: Family

## 2019-06-27 ENCOUNTER — Ambulatory Visit (INDEPENDENT_AMBULATORY_CARE_PROVIDER_SITE_OTHER): Payer: Medicare Other | Admitting: Family

## 2019-06-27 ENCOUNTER — Other Ambulatory Visit: Payer: Self-pay

## 2019-06-27 ENCOUNTER — Other Ambulatory Visit (INDEPENDENT_AMBULATORY_CARE_PROVIDER_SITE_OTHER): Payer: Medicare Other

## 2019-06-27 ENCOUNTER — Encounter: Payer: Self-pay | Admitting: Family

## 2019-06-27 VITALS — BP 116/78 | HR 93 | Temp 98.3°F | Ht 73.0 in | Wt 190.2 lb

## 2019-06-27 DIAGNOSIS — I10 Essential (primary) hypertension: Secondary | ICD-10-CM | POA: Diagnosis not present

## 2019-06-27 DIAGNOSIS — K219 Gastro-esophageal reflux disease without esophagitis: Secondary | ICD-10-CM

## 2019-06-27 DIAGNOSIS — R55 Syncope and collapse: Secondary | ICD-10-CM

## 2019-06-27 DIAGNOSIS — E785 Hyperlipidemia, unspecified: Secondary | ICD-10-CM

## 2019-06-27 DIAGNOSIS — R945 Abnormal results of liver function studies: Secondary | ICD-10-CM

## 2019-06-27 DIAGNOSIS — U071 COVID-19: Secondary | ICD-10-CM

## 2019-06-27 DIAGNOSIS — R7989 Other specified abnormal findings of blood chemistry: Secondary | ICD-10-CM

## 2019-06-27 DIAGNOSIS — I480 Paroxysmal atrial fibrillation: Secondary | ICD-10-CM

## 2019-06-27 LAB — CBC WITH DIFFERENTIAL/PLATELET
Basophils Absolute: 0.1 10*3/uL (ref 0.0–0.1)
Basophils Relative: 1.3 % (ref 0.0–3.0)
Eosinophils Absolute: 0.1 10*3/uL (ref 0.0–0.7)
Eosinophils Relative: 1.7 % (ref 0.0–5.0)
HCT: 39.3 % (ref 39.0–52.0)
Hemoglobin: 13.1 g/dL (ref 13.0–17.0)
Lymphocytes Relative: 17.1 % (ref 12.0–46.0)
Lymphs Abs: 1.2 10*3/uL (ref 0.7–4.0)
MCHC: 33.4 g/dL (ref 30.0–36.0)
MCV: 89.9 fl (ref 78.0–100.0)
Monocytes Absolute: 0.7 10*3/uL (ref 0.1–1.0)
Monocytes Relative: 9.5 % (ref 3.0–12.0)
Neutro Abs: 5.1 10*3/uL (ref 1.4–7.7)
Neutrophils Relative %: 70.4 % (ref 43.0–77.0)
Platelets: 187 10*3/uL (ref 150.0–400.0)
RBC: 4.37 Mil/uL (ref 4.22–5.81)
RDW: 13 % (ref 11.5–15.5)
WBC: 7.2 10*3/uL (ref 4.0–10.5)

## 2019-06-27 LAB — COMPREHENSIVE METABOLIC PANEL
ALT: 313 U/L — ABNORMAL HIGH (ref 0–53)
AST: 67 U/L — ABNORMAL HIGH (ref 0–37)
Albumin: 3.9 g/dL (ref 3.5–5.2)
Alkaline Phosphatase: 109 U/L (ref 39–117)
BUN: 22 mg/dL (ref 6–23)
CO2: 27 mEq/L (ref 19–32)
Calcium: 9.4 mg/dL (ref 8.4–10.5)
Chloride: 103 mEq/L (ref 96–112)
Creatinine, Ser: 1.29 mg/dL (ref 0.40–1.50)
GFR: 55.62 mL/min — ABNORMAL LOW (ref >60.00)
Glucose, Bld: 117 mg/dL — ABNORMAL HIGH (ref 70–99)
Potassium: 4.1 mEq/L (ref 3.5–5.1)
Sodium: 139 mEq/L (ref 135–145)
Total Bilirubin: 0.8 mg/dL (ref 0.2–1.2)
Total Protein: 7.1 g/dL (ref 6.0–8.3)

## 2019-06-27 MED ORDER — AMLODIPINE BESYLATE 10 MG PO TABS: 10.0000 mg | ORAL_TABLET | Freq: Every day | ORAL | 3 refills | Status: DC

## 2019-06-27 MED ORDER — POTASSIUM CHLORIDE CRYS ER 10 MEQ PO TBCR: EXTENDED_RELEASE_TABLET | ORAL | 0 refills | Status: DC

## 2019-06-27 MED ORDER — DOXAZOSIN MESYLATE 2 MG PO TABS: 2.0000 mg | ORAL_TABLET | Freq: Every day | ORAL | 2 refills | Status: DC

## 2019-06-27 MED ORDER — ISOSORBIDE MONONITRATE ER 30 MG PO TB24: 30.0000 mg | ORAL_TABLET | Freq: Every day | ORAL | 2 refills | Status: DC

## 2019-06-27 MED ORDER — RAMIPRIL 10 MG PO CAPS: 10.0000 mg | ORAL_CAPSULE | Freq: Two times a day (BID) | ORAL | 1 refills | Status: DC

## 2019-06-27 MED ORDER — HYDROCHLOROTHIAZIDE 25 MG PO TABS: 25.0000 mg | ORAL_TABLET | Freq: Every day | ORAL | 1 refills | Status: DC

## 2019-06-27 MED ORDER — ONDANSETRON 8 MG PO TBDP: 8.0000 mg | ORAL_TABLET | Freq: Three times a day (TID) | ORAL | 0 refills | Status: DC | PRN

## 2019-06-27 MED ORDER — APIXABAN 5 MG PO TABS: 5.0000 mg | ORAL_TABLET | Freq: Two times a day (BID) | ORAL | 2 refills | Status: DC

## 2019-06-27 MED ORDER — PANTOPRAZOLE SODIUM 40 MG PO TBEC: DELAYED_RELEASE_TABLET | ORAL | 3 refills | Status: DC

## 2019-06-27 MED ORDER — ROSUVASTATIN CALCIUM 40 MG PO TABS: 40.0000 mg | ORAL_TABLET | Freq: Every day | ORAL | 3 refills | Status: DC

## 2019-06-27 MED ORDER — EZETIMIBE 10 MG PO TABS: 10.0000 mg | ORAL_TABLET | Freq: Every day | ORAL | 3 refills | Status: DC

## 2019-06-27 MED ORDER — FINASTERIDE 5 MG PO TABS: 5.0000 mg | ORAL_TABLET | Freq: Every day | ORAL | 2 refills | Status: DC

## 2019-06-27 MED ORDER — METOPROLOL TARTRATE 25 MG PO TABS: 25.0000 mg | ORAL_TABLET | Freq: Every day | ORAL | 3 refills | Status: DC | PRN

## 2019-06-27 NOTE — Telephone Encounter (Signed)
Spoke with the pt daughter, Donella Stade [phone], on DPR... 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 He has went to the ED 06/05/19 and was admitted... he tested pos for COVID and had severe diarrhea which resulted in an Ileus.. while in the hosp he was told he was going in and out of AFIB.... while he was at his PCP office today for post hosp visit.. he was noted to be in AFIB... she says his VS have been "normal' but needed to make his 06/04/19 appt with sooner.. she was already given an appt froma scheduler for Th 06/29/19 with Daune Perch NP...Marland Kitchen pts wife had fallen and broke her hip this morning so they are under a considerable amount of stress. I have asked that he tries to relax and to get some rest if possible... to take all of his meds.Marland Kitchen and he has been released by his PCP to be seen in our office.       COVID-19 Pre-Screening Questions:  . In the past 7 to 10 days have you had a cough,  shortness of breath, headache, congestion, fever (100 or greater) body aches, chills, sore throat, or sudden loss of taste or sense of smell? NO . Have you been around anyone with known Covid 19. NO . Have you been around anyone who is awaiting Covid 19 test results in the past 7 to 10 days? NO Have you been around anyone who has been exposed to Covid 19, or has mentioned symptoms of Covid 19 within the past 7 to 10 days? NO  If you have any concerns/questions about symptoms patients report during screening (either on the phone or at threshold). Contact the provider seeing the patient or DOD for further guidance.  If neither are available contact a member of the leadership team.

## 2019-06-27 NOTE — Progress Notes (Signed)
[name] is a 68 y.o. male with the following history as recorded in Epic[employer]:  Patient Active Problem List   Diagnosis Date Noted  . Syncope 06/05/2019  . COVID-19 virus infection 06/05/2019  . AKI (acute kidney injury) ([place]) 06/05/2019  . Acute left ankle pain 08/05/2018  . Atrial fibrillation ([place]) 05/30/2018  . CAD in native artery   . Preoperative clearance 04/26/2018  . Bradycardia 04/26/2018  . Dizziness 04/26/2018  . Hypersomnia with sleep apnea 12/05/2015  . Unstable angina ([place]) 12/02/2015  . Plantar fasciitis, bilateral 12/13/2012  . Carotid artery disease ([place]) 11/24/2010  . BRADYCARDIA 04/12/2009  . Coronary atherosclerosis 12/25/2008  . Obstructive sleep apnea 02/03/2008  . MYOCARDIAL INFARCTION 02/03/2008  . Hyperlipidemia 10/08/2007  . Essential hypertension 10/08/2007  . GASTROESOPHAGEAL REFLUX DISEASE 10/08/2007    Current Outpatient Medications  Medication Sig Dispense Refill  . amLODipine (NORVASC) 10 MG tablet Take 1 tablet (10 mg total) by mouth daily. 90 tablet 3  . apixaban (ELIQUIS) 5 MG TABS tablet Take 1 tablet (5 mg total) by mouth 2 (two) times daily. 180 tablet 2  . aspirin 81 MG tablet Take 81 mg by mouth at bedtime.     Marland Kitchen b complex vitamins capsule Take 1 capsule by mouth daily.    Marland Kitchen doxazosin (CARDURA) 2 MG tablet Take 1 tablet (2 mg total) by mouth at bedtime. 90 tablet 2  . ezetimibe (ZETIA) 10 MG tablet Take 1 tablet (10 mg total) by mouth daily. 90 tablet 3  . finasteride (PROSCAR) 5 MG tablet Take 1 tablet (5 mg total) by mouth daily. 90 tablet 2  . gabapentin (NEURONTIN) 300 MG capsule Take 1 tablet in the morning and 2 tablet at bedtime (Patient taking differently: Take 300-600 mg by mouth See admin instructions. Take 300 mg by mouth in the morning and 600 mg at bedtime) 270 capsule 3  . hydrochlorothiazide (HYDRODIURIL) 25 MG tablet Take 1 tablet (25 mg total) by mouth daily. Needs visit for future refills. 90 tablet 1  . isosorbide  mononitrate (IMDUR) 30 MG 24 hr tablet Take 1 tablet (30 mg total) by mouth daily. Needs visit for future refills. 90 tablet 2  . nitroGLYCERIN (NITROSTAT) 0.4 MG SL tablet Place 1 tablet (0.4 mg total) under the tongue every 5 (five) minutes as needed. Call 9-1-1 if need more than 2. 25 tablet 0  . pantoprazole (PROTONIX) 40 MG tablet TAKE 1 TABLET BY MOUth twice a day as directed 180 tablet 3  . polyethylene glycol (MIRALAX / GLYCOLAX) 17 g packet Take 17 g by mouth daily as needed for mild constipation. 30 each 0  . potassium chloride (K-DUR) 10 MEQ tablet TAKE 1 TABLET(10 MEQ) BY MOUTH DAILY 90 tablet 0  . ramipril (ALTACE) 10 MG capsule Take 1 capsule (10 mg total) by mouth 2 (two) times daily. 180 capsule 1  . rosuvastatin (CRESTOR) 40 MG tablet Take 1 tablet (40 mg total) by mouth daily. Needs visit for future refills. 90 tablet 3  . tiZANidine (ZANAFLEX) 2 MG tablet Take one tablet at bedtime as needed for pain, if tolerating, ok to take twice daily as needed (Patient taking differently: Take 2 mg by mouth 2 (two) times daily as needed for muscle spasms. Take one tablet at bedtime as needed for pain, if tolerating, ok to take twice daily as needed) 45 tablet 3  . docusate sodium (COLACE) 100 MG capsule Take 1 capsule (100 mg total) by mouth daily as needed. (Patient  not taking: Reported on 06/27/2019) 30 capsule 0  . metoprolol tartrate (LOPRESSOR) 25 MG tablet Take 1 tablet (25 mg total) by mouth daily as needed (as needed for palpitations/afib.). 60 tablet 3  . ondansetron (ZOFRAN ODT) 8 MG disintegrating tablet Take 1 tablet (8 mg total) by mouth every 8 (eight) hours as needed for nausea or vomiting. 20 tablet 0   No current facility-administered medications for this visit.     Allergies: Felodipine and Niacin  Past Medical History:  Diagnosis Date  . Atrial fibrillation ([place])   . Bradycardia   . Carotid artery disease ([place])    Right CEA;  dopplers 6/12:  0-39% bilat ICA  . Coronary  artery disease    s/p BMS to RCA 2002; [place] 2008: oLAD 40%, pRCA stent ok with 20%, EF 60%); Myoview scan in March 2011 which was negative for ischemia   . GERD (gastroesophageal reflux disease)   . Hyperlipidemia   . Hypertension   . Myocardial infarction ([place])   . OSA on CPAP     Past Surgical History:  Procedure Laterality Date  . CARDIAC CATHETERIZATION  06/01/2007    EF of 65% -- Nonobstructive CAD with 40% ostial stenosis in the LAD, no significant obstruction in the circumflex artery, 20% narrowing within the stent in the proximal to mid right coronary and normal LV function  . CARDIAC CATHETERIZATION  02/02/2006   EF of 50%  . CARDIAC CATHETERIZATION  01/19/2002   EF of 60%  . CARDIAC CATHETERIZATION N/A 12/03/2015   Procedure: Left Heart Cath and Coronary Angiography;  Surgeon: Burnell Blanks, MD;  Location: [facility];  Service: Cardiovascular;  Laterality: N/A;  . CAROTID ENDARTERECTOMY  06/26/2002   right  . CORONARY ANGIOPLASTY WITH STENT PLACEMENT  03/08/2000   Bare-metal stent in RCA, in [place]  . KNEE ARTHROSCOPY     right  . MOLE REMOVAL     Prior moles removed    Family History  Problem Relation Age of Onset  . Heart failure Father   . Heart disease Father   . Peripheral vascular disease Mother        with pacemaker, and had a carotid endarterectomy  . Heart disease Mother   . Hypertension Brother   . Hypertension Brother     Social History   Tobacco Use  . Smoking status: Former Smoker    Packs/day: 1.00    Years: 30.00    Pack years: 30.00    Types: Cigarettes    Quit date: 10/19/2000    Years since quitting: 18.6  . Smokeless tobacco: Current User    Types: Snuff  . Tobacco comment: Dips snuff.   Substance Use Topics  . Alcohol use: Yes    Alcohol/week: 1.0 standard drinks    Types: 1 Cans of beer per week    Comment: Occasional beer    Subjective:  Patient was admitted to hospital 3 weeks ago with syncopal episode- found  to be in A. Fib and + Covid 19; very complicated hospital stay- developed small bowel obstruction as a result of COVID; actually doing very well except his liver functions were elevated at time of discharge- needs repeat labs; also concerned that his "A. Fib is continuing to act up"- scheduled to see his cardiologist next week; has been feeling more dizzy/ light-headed recently;   Also needs to have medication refills updated to mail order pharmacy;    Objective:  Vitals:   06/27/19 1137  BP:  116/78  Pulse: 93  Temp: 98.3 F (36.8 C)  TempSrc: Oral  SpO2: 97%  Weight: 190 lb 3.2 oz (86.3 kg)  Height: _0  (1.854 m)    General: Well developed, well nourished, in no acute distress  Skin : Warm and dry.  Head: Normocephalic and atraumatic  Eyes: Sclera and conjunctiva clear; pupils round and reactive to light; extraocular movements intact  Ears: External normal; canals clear; tympanic membranes normal  Oropharynx: Pink, supple. No suspicious lesions  Neck: Supple without thyromegaly, adenopathy  Lungs: Respirations unlabored; clear to auscultation bilaterally without wheeze, rales, rhonchi  CVS exam: irregularly irregular rhythm with rate 93.  Abdomen: Soft; nontender; nondistended; normoactive bowel sounds; no masses or hepatosplenomegaly  Musculoskeletal: No deformities; no active joint inflammation  Extremities: No edema, cyanosis, clubbing  Vessels: Symmetric bilaterally  Neurologic: Alert and oriented; speech intact; face symmetrical; uses cane;  Assessment:  1. Essential hypertension   2. Paroxysmal atrial fibrillation (HCC)   3. Elevated LFTs   4. COVID-19 virus infection   5. Gastroesophageal reflux disease, esophagitis presence not specified   6. Hyperlipidemia, unspecified hyperlipidemia type   7. Syncope, unspecified syncope type     Plan:  Patient is in A. Fib at time of OV- he is encouraged to call his cardiologist to be seen sooner than next week; will update labs  to follow-up on liver functions- follow-up to be determined;  Spent 30+ minutes with patient reviewing medications/ discussing treatment options for A. Fib.      No follow-ups on file.  Orders Placed This Encounter  Procedures  . CBC w/Diff    Standing Status:   Future    Number of Occurrences:   1    Standing Expiration Date:   06/26/2020  . Comp Met (CMET)    Standing Status:   Future    Number of Occurrences:   1    Standing Expiration Date:   06/26/2020    Requested Prescriptions   Signed Prescriptions Disp Refills  . ondansetron (ZOFRAN ODT) 8 MG disintegrating tablet 20 tablet 0    Sig: Take 1 tablet (8 mg total) by mouth every 8 (eight) hours as needed for nausea or vomiting.  Marland Kitchen amLODipine (NORVASC) 10 MG tablet 90 tablet 3    Sig: Take 1 tablet (10 mg total) by mouth daily.  Marland Kitchen apixaban (ELIQUIS) 5 MG TABS tablet 180 tablet 2    Sig: Take 1 tablet (5 mg total) by mouth 2 (two) times daily.  . pantoprazole (PROTONIX) 40 MG tablet 180 tablet 3    Sig: TAKE 1 TABLET BY MOUth twice a day as directed  . potassium chloride (K-DUR) 10 MEQ tablet 90 tablet 0    Sig: TAKE 1 TABLET(10 MEQ) BY MOUTH DAILY  . ramipril (ALTACE) 10 MG capsule 180 capsule 1    Sig: Take 1 capsule (10 mg total) by mouth 2 (two) times daily.  Marland Kitchen ezetimibe (ZETIA) 10 MG tablet 90 tablet 3    Sig: Take 1 tablet (10 mg total) by mouth daily.  Marland Kitchen doxazosin (CARDURA) 2 MG tablet 90 tablet 2    Sig: Take 1 tablet (2 mg total) by mouth at bedtime.  . metoprolol tartrate (LOPRESSOR) 25 MG tablet 60 tablet 3    Sig: Take 1 tablet (25 mg total) by mouth daily as needed (as needed for palpitations/afib.).  Marland Kitchen isosorbide mononitrate (IMDUR) 30 MG 24 hr tablet 90 tablet 2    Sig: Take 1 tablet (30 mg total)  by mouth daily. Needs visit for future refills.  . hydrochlorothiazide (HYDRODIURIL) 25 MG tablet 90 tablet 1    Sig: Take 1 tablet (25 mg total) by mouth daily. Needs visit for future refills.  . rosuvastatin  (CRESTOR) 40 MG tablet 90 tablet 3    Sig: Take 1 tablet (40 mg total) by mouth daily. Needs visit for future refills.  . finasteride (PROSCAR) 5 MG tablet 90 tablet 2    Sig: Take 1 tablet (5 mg total) by mouth daily.

## 2019-06-28 ENCOUNTER — Encounter: Payer: Self-pay | Admitting: Cardiology

## 2019-06-29 ENCOUNTER — Encounter: Payer: Self-pay | Admitting: Cardiology

## 2019-06-29 ENCOUNTER — Inpatient Hospital Stay (HOSPITAL_COMMUNITY): Payer: Medicare Other

## 2019-06-29 ENCOUNTER — Other Ambulatory Visit: Payer: Self-pay

## 2019-06-29 ENCOUNTER — Encounter (HOSPITAL_COMMUNITY): Payer: Self-pay

## 2019-06-29 ENCOUNTER — Ambulatory Visit (INDEPENDENT_AMBULATORY_CARE_PROVIDER_SITE_OTHER): Payer: Medicare Other | Admitting: Cardiology

## 2019-06-29 ENCOUNTER — Emergency Department (HOSPITAL_COMMUNITY)
Admission: EM | Admit: 2019-06-29 | Discharge: 2019-06-29 | Disposition: A | Discharge status: Home / Self Care | Payer: Medicare Other | Attending: Cardiovascular Disease | Admitting: Cardiovascular Disease

## 2019-06-29 VITALS — BP 120/60 | HR 168 | Ht 73.0 in | Wt 187.1 lb

## 2019-06-29 DIAGNOSIS — Z7901 Long term (current) use of anticoagulants: Secondary | ICD-10-CM | POA: Diagnosis not present

## 2019-06-29 DIAGNOSIS — I252 Old myocardial infarction: Secondary | ICD-10-CM | POA: Diagnosis not present

## 2019-06-29 DIAGNOSIS — I480 Paroxysmal atrial fibrillation: Secondary | ICD-10-CM

## 2019-06-29 DIAGNOSIS — I483 Typical atrial flutter: Secondary | ICD-10-CM | POA: Diagnosis not present

## 2019-06-29 DIAGNOSIS — I2510 Atherosclerotic heart disease of native coronary artery without angina pectoris: Secondary | ICD-10-CM | POA: Diagnosis not present

## 2019-06-29 DIAGNOSIS — I4891 Unspecified atrial fibrillation: Secondary | ICD-10-CM

## 2019-06-29 DIAGNOSIS — I10 Essential (primary) hypertension: Secondary | ICD-10-CM

## 2019-06-29 DIAGNOSIS — E1022 Type 1 diabetes mellitus with diabetic chronic kidney disease: Secondary | ICD-10-CM

## 2019-06-29 DIAGNOSIS — R000 Tachycardia, unspecified: Secondary | ICD-10-CM

## 2019-06-29 DIAGNOSIS — Z7982 Long term (current) use of aspirin: Secondary | ICD-10-CM | POA: Insufficient documentation

## 2019-06-29 DIAGNOSIS — I779 Disorder of arteries and arterioles, unspecified: Secondary | ICD-10-CM

## 2019-06-29 DIAGNOSIS — F17220 Nicotine dependence, chewing tobacco, uncomplicated: Secondary | ICD-10-CM | POA: Insufficient documentation

## 2019-06-29 DIAGNOSIS — E785 Hyperlipidemia, unspecified: Secondary | ICD-10-CM

## 2019-06-29 DIAGNOSIS — Z79899 Other long term (current) drug therapy: Secondary | ICD-10-CM | POA: Insufficient documentation

## 2019-06-29 DIAGNOSIS — I498 Other specified cardiac arrhythmias: Secondary | ICD-10-CM

## 2019-06-29 DIAGNOSIS — N183 Chronic kidney disease, stage 3 unspecified: Secondary | ICD-10-CM

## 2019-06-29 DIAGNOSIS — I739 Peripheral vascular disease, unspecified: Secondary | ICD-10-CM

## 2019-06-29 HISTORY — DX: Unspecified atrial fibrillation: I48.91

## 2019-06-29 LAB — CBC
HCT: 39.2 % (ref 39.0–52.0)
Hemoglobin: 13.1 g/dL (ref 13.0–17.0)
MCH: 29.8 pg (ref 26.0–34.0)
MCHC: 33.4 g/dL (ref 30.0–36.0)
MCV: 89.3 fL (ref 80.0–100.0)
Platelets: 163 10*3/uL (ref 150–400)
RBC: 4.39 MIL/uL (ref 4.22–5.81)
RDW: 12.3 % (ref 11.5–15.5)
WBC: 6 10*3/uL (ref 4.0–10.5)
nRBC: 0 % (ref 0.0–0.2)

## 2019-06-29 LAB — BASIC METABOLIC PANEL
Anion gap: 10 (ref 5–15)
BUN: 16 mg/dL (ref 8–23)
CO2: 21 mmol/L — ABNORMAL LOW (ref 22–32)
Calcium: 8.6 mg/dL — ABNORMAL LOW (ref 8.9–10.3)
Chloride: 105 mmol/L (ref 98–111)
Creatinine, Ser: 1.18 mg/dL (ref 0.61–1.24)
GFR calc Af Amer: >60 mL/min (ref >60)
GFR calc non Af Amer: >60 mL/min (ref >60)
Glucose, Bld: 126 mg/dL — ABNORMAL HIGH (ref 70–99)
Potassium: 5.1 mmol/L (ref 3.5–5.1)
Sodium: 136 mmol/L (ref 135–145)

## 2019-06-29 LAB — TROPONIN I (HIGH SENSITIVITY)
Troponin I (High Sensitivity): 37 ng/L — ABNORMAL HIGH (ref <18)
Troponin I (High Sensitivity): 83 ng/L — ABNORMAL HIGH (ref <18)

## 2019-06-29 LAB — TSH: TSH: 1.913 u[IU]/mL (ref 0.350–4.500)

## 2019-06-29 LAB — MAGNESIUM: Magnesium: 1.9 mg/dL (ref 1.7–2.4)

## 2019-06-29 MED ORDER — SODIUM CHLORIDE 0.9 % IV BOLUS
500.0000 mL | Freq: Once | INTRAVENOUS | Status: AC
  Administered 2019-06-29: 11:00:00 500 mL via INTRAVENOUS

## 2019-06-29 MED ORDER — SODIUM CHLORIDE 0.9 % IV BOLUS: 500.0000 mL | Freq: Once | INTRAVENOUS | Status: DC

## 2019-06-29 MED ORDER — HYDROCHLOROTHIAZIDE 25 MG PO TABS: 25.0000 mg | ORAL_TABLET | Freq: Every day | ORAL | Status: DC

## 2019-06-29 MED ORDER — GABAPENTIN 300 MG PO CAPS: 600.0000 mg | ORAL_CAPSULE | Freq: Every day | ORAL | Status: DC

## 2019-06-29 MED ORDER — ROSUVASTATIN CALCIUM 20 MG PO TABS: 40.0000 mg | ORAL_TABLET | Freq: Every day | ORAL | Status: DC

## 2019-06-29 MED ORDER — SODIUM CHLORIDE 0.9 % IV BOLUS
500.0000 mL | Freq: Once | INTRAVENOUS | Status: AC
  Administered 2019-06-29: 500 mL via INTRAVENOUS

## 2019-06-29 MED ORDER — APIXABAN 5 MG PO TABS: 5.0000 mg | ORAL_TABLET | Freq: Two times a day (BID) | ORAL | Status: DC

## 2019-06-29 MED ORDER — DILTIAZEM HCL-DEXTROSE 100-5 MG/100ML-% IV SOLN (PREMIX)
5.0000 mg/h | INTRAVENOUS | Status: DC
  Administered 2019-06-29: 5 mg/h via INTRAVENOUS
  Filled 2019-06-29: qty 100

## 2019-06-29 MED ORDER — DOXAZOSIN MESYLATE 2 MG PO TABS: 2.0000 mg | ORAL_TABLET | Freq: Every day | ORAL | Status: DC

## 2019-06-29 MED ORDER — LORAZEPAM 2 MG/ML IJ SOLN
0.5000 mg | Freq: Once | INTRAMUSCULAR | Status: AC
  Administered 2019-06-29: 0.5 mg via INTRAVENOUS
  Filled 2019-06-29: qty 1

## 2019-06-29 MED ORDER — PANTOPRAZOLE SODIUM 40 MG PO TBEC: 40.0000 mg | DELAYED_RELEASE_TABLET | Freq: Every day | ORAL | Status: DC

## 2019-06-29 MED ORDER — ISOSORBIDE MONONITRATE ER 30 MG PO TB24: 30.0000 mg | ORAL_TABLET | Freq: Every day | ORAL | Status: DC

## 2019-06-29 MED ORDER — DRONEDARONE HCL 400 MG PO TABS
400.0000 mg | ORAL_TABLET | Freq: Two times a day (BID) | ORAL | Status: DC
  Filled 2019-06-29: qty 1

## 2019-06-29 MED ORDER — FINASTERIDE 5 MG PO TABS: 5.0000 mg | ORAL_TABLET | Freq: Every day | ORAL | Status: DC

## 2019-06-29 MED ORDER — ASPIRIN EC 81 MG PO TBEC: 81.0000 mg | DELAYED_RELEASE_TABLET | Freq: Every day | ORAL | Status: DC

## 2019-06-29 MED ORDER — POLYETHYLENE GLYCOL 3350 17 G PO PACK: 17.0000 g | PACK | Freq: Every day | ORAL | Status: DC | PRN

## 2019-06-29 MED ORDER — DRONEDARONE HCL 400 MG PO TABS: 400.0000 mg | ORAL_TABLET | Freq: Two times a day (BID) | ORAL | 1 refills | Status: DC

## 2019-06-29 MED ORDER — EZETIMIBE 10 MG PO TABS: 10.0000 mg | ORAL_TABLET | Freq: Every day | ORAL | Status: DC

## 2019-06-29 MED ORDER — ETOMIDATE 2 MG/ML IV SOLN
10.0000 mg | Freq: Once | INTRAVENOUS | Status: AC
  Administered 2019-06-29: 10 mg via INTRAVENOUS
  Filled 2019-06-29: qty 10

## 2019-06-29 MED ORDER — RAMIPRIL 10 MG PO CAPS: 10.0000 mg | ORAL_CAPSULE | Freq: Two times a day (BID) | ORAL | Status: DC

## 2019-06-29 MED ORDER — POTASSIUM CHLORIDE CRYS ER 20 MEQ PO TBCR: 10.0000 meq | EXTENDED_RELEASE_TABLET | Freq: Every day | ORAL | Status: DC

## 2019-06-29 MED ORDER — DILTIAZEM LOAD VIA INFUSION
10.0000 mg | Freq: Once | INTRAVENOUS | Status: DC
  Filled 2019-06-29: qty 10

## 2019-06-29 MED ORDER — NITROGLYCERIN 0.4 MG SL SUBL: 0.4000 mg | SUBLINGUAL_TABLET | SUBLINGUAL | Status: DC | PRN

## 2019-06-29 NOTE — Addendum Note (Signed)
Addended by: Jeremy Johann on: 06/29/2019 09:59 AM   Modules accepted: Orders

## 2019-06-29 NOTE — Sedation Documentation (Signed)
1152 Shock given a 100J 1153 bp 141/82, p 106 Sa02 97% on 3L02 per [place], converted to NSR 1155 bp 115/84, p 90, Sa02 100% on 3L02 per [place], ETCO2 34, pt awake 1200 bp 115/76, p 87, ETCO2 31, Sa002 100% on 3L 02 per [place].

## 2019-06-29 NOTE — Discharge Instructions (Addendum)

## 2019-06-29 NOTE — Patient Instructions (Signed)
Medication Instructions:  Your physician recommends that you continue on your current medications as directed. Please refer to the Current Medication list given to you today.  If you need a refill on your cardiac medications before your next appointment, please call your pharmacy.   Lab work: None   If you have labs (blood work) drawn today and your tests are completely normal, you will receive your results only by: Marland Kitchen MyChart Message (if you have MyChart) OR . A paper copy in the mail If you have any lab test that is abnormal or we need to change your treatment, we will call you to review the results.  Testing/Procedures: None   Follow-Up:  PROCEED TO [facility] FOR ADMISSION DUE TO YOUR ABNORMAL HEART RHYTHM   Any Other Special Instructions Will Be Listed Below (If Applicable).

## 2019-06-29 NOTE — Progress Notes (Addendum)
Cardiology Office Note:    Date:  06/29/2019   ID:  [name], DOB 29-Nov-1950, MRN [id number]  PCP:  [name], FNP  Cardiologist:  Lauree Chandler, MD  Referring MD: [name],*   Chief Complaint  Patient presents with  . Hospitalization Follow-up  . Atrial Fibrillation    History of Present Illness:    [name] is a 68 y.o. male with a past medical history significant for CAD, paroxysmal atrial fibrillation on Eliquis, hypertension, sleep apnea, hyperlipidemia and carotid artery disease s/p right CEA.  In 2002 he had a bare-metal stent to the right coronary artery.  Cardiac catheterization in 2008 showed nonobstructive disease.  He had a negative Myoview in 2011.  Carotid duplex in 2012 showed mild disease and stable right CEA site.  Cardiac cath in February 2017 showed patent RCA stent and mild nonobstructive disease in the RCA, circumflex and LAD.  He was bradycardic when seen in the office 04/2018 for preoperative assessment and his atenolol was stopped.  He then began to have chest pressure and fatigue.  Nuclear stress test in 04/2018 showed no ischemia.  Echo in 06/05/2018 showed normal EF of 123456, grade 2 diastolic dysfunction and no significant valve disease.  Carotid artery Dopplers in 2019 showed mild bilateral carotid artery disease.  Cardiac monitor with nocturnal sinus bradycardia and short 3-second runs of SVT.  His beta-blocker was not restarted.  He was admitted to [facility] in 05/2018 with atrial fibrillation with RVR and was started on Eliquis.  He converted to sinus rhythm in the ED after 1 dose of IV metoprolol.  He was seen in the hospital by our EP team and they discussed Tikosyn versus as needed beta-blocker and he elected for as needed beta-blocker.  He has been seen since in the A. fib clinic.  The patient was last seen in the office on 12/22/2018 by Dr. Angelena Form at which time he was doing well with improved energy levels.   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 He works as a Optometrist since retirement.  He was found to be positive for COVID-19 infection.  He developed small bowel obstruction that improved with NG tube decompression.  Patient's chest x-ray was unremarkable and he never had respiratory symptoms from COVID.  The syncope was felt likely due to orthostatic hypotension from volume loss.  He did have acute kidney injury that resolved with IV fluid hydration.  He also had transaminitis felt to be due to COVID-19.  The patient was seen by his PCP on 06/27/2019 and was noted to have more lightheadedness and was also in atrial fibrillation with a rate of 93 bpm.  Today [name] says that he is feeling "real bad". He is very week, lightheaded, short of breath with walking. He has no chest pain or tightness. He is using a cane to walk today due to weakness. He does not usually use a cane. He has not had any metoprolol to use as he left it in [place] on a trip.   EKG today shows atrial flutter with 2-1 conduction, 168 bpm.   Past Medical History:  Diagnosis Date  . Atrial fibrillation ([place])   . Bradycardia   . Carotid artery disease ([place])    Right CEA;  dopplers 6/12:  0-39% bilat ICA  . Coronary artery disease    s/p BMS  to RCA 2002; [place] 2008: oLAD 40%, pRCA stent ok with 20%, EF 60%); Myoview scan in March 2011 which was negative for ischemia   . GERD (gastroesophageal reflux disease)   . Hyperlipidemia   . Hypertension   . Myocardial infarction ([place])   . OSA on CPAP     Past Surgical History:  Procedure Laterality Date  . CARDIAC CATHETERIZATION  06/01/2007    EF of 65% -- Nonobstructive CAD with 40% ostial stenosis in the LAD, no significant obstruction in the circumflex artery, 20% narrowing within the stent in the proximal to mid right coronary and normal LV function  . CARDIAC  CATHETERIZATION  02/02/2006   EF of 50%  . CARDIAC CATHETERIZATION  01/19/2002   EF of 60%  . CARDIAC CATHETERIZATION N/A 12/03/2015   Procedure: Left Heart Cath and Coronary Angiography;  Surgeon: Burnell Blanks, MD;  Location: [place] CV LAB;  Service: Cardiovascular;  Laterality: N/A;  . CAROTID ENDARTERECTOMY  06/26/2002   right  . CORONARY ANGIOPLASTY WITH STENT PLACEMENT  03/08/2000   Bare-metal stent in RCA, in [place]  . KNEE ARTHROSCOPY     right  . MOLE REMOVAL     Prior moles removed    Current Medications: Current Meds  Medication Sig  . amLODipine (NORVASC) 10 MG tablet Take 1 tablet (10 mg total) by mouth daily.  Marland Kitchen apixaban (ELIQUIS) 5 MG TABS tablet Take 1 tablet (5 mg total) by mouth 2 (two) times daily.  Marland Kitchen aspirin 81 MG tablet Take 81 mg by mouth at bedtime.   Marland Kitchen b complex vitamins capsule Take 1 capsule by mouth daily.  Marland Kitchen doxazosin (CARDURA) 2 MG tablet Take 1 tablet (2 mg total) by mouth at bedtime.  Marland Kitchen ezetimibe (ZETIA) 10 MG tablet Take 1 tablet (10 mg total) by mouth daily.  . finasteride (PROSCAR) 5 MG tablet Take 1 tablet (5 mg total) by mouth daily.  Marland Kitchen gabapentin (NEURONTIN) 300 MG capsule Take 1 tablet in the morning and 2 tablet at bedtime  . hydrochlorothiazide (HYDRODIURIL) 25 MG tablet Take 1 tablet (25 mg total) by mouth daily. Needs visit for future refills.  . isosorbide mononitrate (IMDUR) 30 MG 24 hr tablet Take 1 tablet (30 mg total) by mouth daily. Needs visit for future refills.  . metoprolol tartrate (LOPRESSOR) 25 MG tablet Take 1 tablet (25 mg total) by mouth daily as needed (as needed for palpitations/afib.).  Marland Kitchen nitroGLYCERIN (NITROSTAT) 0.4 MG SL tablet Place 1 tablet (0.4 mg total) under the tongue every 5 (five) minutes as needed. Call 9-1-1 if need more than 2.  . ondansetron (ZOFRAN ODT) 8 MG disintegrating tablet Take 1 tablet (8 mg total) by mouth every 8 (eight) hours as needed for nausea or vomiting.  . pantoprazole  (PROTONIX) 40 MG tablet TAKE 1 TABLET BY MOUth twice a day as directed  . polyethylene glycol (MIRALAX / GLYCOLAX) 17 g packet Take 17 g by mouth daily as needed for mild constipation.  . potassium chloride (K-DUR) 10 MEQ tablet TAKE 1 TABLET(10 MEQ) BY MOUTH DAILY  . ramipril (ALTACE) 10 MG capsule Take 1 capsule (10 mg total) by mouth 2 (two) times daily.  . rosuvastatin (CRESTOR) 40 MG tablet Take 1 tablet (40 mg total) by mouth daily. Needs visit for future refills.  Marland Kitchen tiZANidine (ZANAFLEX) 2 MG tablet Take one tablet at bedtime as needed for pain, if tolerating, ok to take twice daily as needed     Allergies:   Felodipine  and Niacin   Social History   Socioeconomic History  . Marital status: Married    Spouse name: Not on file  . Number of children: 3  . Years of education: 51  . Highest education level: High school graduate  Occupational History  . Occupation: Barista  . Financial resource strain: Not on file  . Food insecurity    Worry: Not on file    Inability: Not on file  . Transportation needs    Medical: Not on file    Non-medical: Not on file  Tobacco Use  . Smoking status: Former Smoker    Packs/day: 1.00    Years: 30.00    Pack years: 30.00    Types: Cigarettes    Quit date: 10/19/2000    Years since quitting: 18.7  . Smokeless tobacco: Current User    Types: Snuff  . Tobacco comment: Dips snuff.   Substance and Sexual Activity  . Alcohol use: Yes    Alcohol/week: 1.0 standard drinks    Types: 1 Cans of beer per week    Comment: Occasional beer  . Drug use: No  . Sexual activity: Yes  Lifestyle  . Physical activity    Days per week: Not on file    Minutes per session: Not on file  . Stress: Not on file  Relationships  . Social Herbalist on phone: Not on file    Gets together: Not on file    Attends religious service: Not on file    Active member of club or organization: Not on file    Attends meetings of clubs or  organizations: Not on file    Relationship status: Not on file  Other Topics Concern  . Not on file  Social History Narrative   Lives with wife in a one story home.  Has 3 children.  Works in Architect.  Education: high school. Patient is right handed.     Family History: The patient's family history includes Heart disease in his father and mother; Heart failure in his father; Hypertension in his brother and brother; Peripheral vascular disease in his mother. ROS:   Please see the history of present illness.     All other systems reviewed and are negative.  EKGs/Labs/Other Studies Reviewed:    The following studies were reviewed today:  Echocardiogram 06/06/2019 IMPRESSIONS   1. The left ventricle has normal systolic function, with an ejection fraction of 60-65%. The cavity size was normal. There is mildly increased left ventricular wall thickness. Left ventricular diastolic Doppler parameters are consistent with impaired  relaxation. Indeterminate filling pressures The E/e' is 8-15. No evidence of left ventricular regional wall motion abnormalities.  2. The right ventricle has normal systolc function. The cavity was normal. There is no increase in right ventricular wall thickness.  3. The mitral valve is grossly normal.  4. The tricuspid valve was grossly normal.  5. The aortic valve is tricuspid Mild sclerosis of the aortic valve. No stenosis of the aortic valve.   EKG:  EKG is ordered today.  The ekg ordered today demonstrates EKG today shows atrial flutter with 2-1 conduction, 168 bpm.    Recent Labs: 06/05/2019: TSH 2.303 06/16/2019: B Natriuretic Peptide 74.1 06/22/2019: Magnesium 2.2 06/27/2019: ALT 313; BUN 22; Creatinine, Ser 1.29; Hemoglobin 13.1; Platelets 187.0; Potassium 4.1; Sodium 139   Recent Lipid Panel    Component Value Date/Time   CHOL 114 05/31/2018 0539   TRIG 162 (H) 06/19/2019  0430   HDL 30 (L) 05/31/2018 0539   CHOLHDL 3.8 05/31/2018 0539   VLDL 22  05/31/2018 0539   LDLCALC 62 05/31/2018 0539    Physical Exam:    VS:  BP 120/60   Pulse (!) 168   Ht 6\' 1"  (1.854 m)   Wt 187 lb 1.9 oz (84.9 kg)   SpO2 97%   BMI 24.69 kg/m     Wt Readings from Last 3 Encounters:  06/29/19 187 lb 1.9 oz (84.9 kg)  06/27/19 190 lb 3.2 oz (86.3 kg)  06/21/19 179 lb 11.2 oz (81.5 kg)     Physical Exam  Constitutional: He is oriented to person, place, and time. He appears well-developed and well-nourished. No distress.  Patient appears weak, using a cane to walk.  HENT:  Head: Normocephalic and atraumatic.  Neck: Normal range of motion. No JVD present.  Cardiovascular: Regular rhythm. Tachycardia present.  Pulmonary/Chest: Effort normal and breath sounds normal. No respiratory distress. He has no wheezes. He has no rales.  Abdominal: Soft. Bowel sounds are normal.  Musculoskeletal: Normal range of motion.        General: No edema.  Neurological: He is alert and oriented to person, place, and time.  Skin: Skin is warm and dry.  Psychiatric: He has a normal mood and affect. His behavior is normal. Judgment and thought content normal.  Vitals reviewed.   ASSESSMENT:    1. Paroxysmal atrial fibrillation (HCC)   2. Typical atrial flutter ([place])   3. Coronary artery disease involving native coronary artery of native heart without angina pectoris   4. Essential (primary) hypertension   5. CKD stage 3 due to type 1 diabetes mellitus ([place])   6. Hyperlipidemia, unspecified hyperlipidemia type   7. Bilateral carotid artery disease, unspecified type ([place])    PLAN:    In order of problems listed above:  Paroxysmal atrial fibrillation/atrial flutter -Has been maintaining sinus rhythm.  Not on scheduled beta-blocker due to bradycardia.  He has metoprolol to use as needed.  Patient seen by his PCP on 06/27/2019 and was in A. fib at the time, rate 93. -CHA2DS2/VAS Stroke Risk Score is 3 (Vasc Dz, HTN, Age).  He is anticoagulated with Eliquis for stroke  risk reduction.  He was recently briefly off Eliquis during hospitalization with small bowel obstruction. -Today patient is very symptomatic, weak, lightheaded, short of breath with exertion, walking with a cane.  EKG shows atrial flutter with 2-1 AV conduction, 168 bpm.  Patient has not had his as needed beta-blocker to take as he left it in [place]. -Case discussed with Dr. Angelena Form who went in and spoke with the patient.  With patient being very symptomatic, we will have him admitted to the hospital with plans for Cardizem drip and EP to evaluate for consideration of further management of recurrent A. fib/flutter.  We will continue Eliquis.  CAD without angina -Most recent cath in 2017 showed stable CAD.  Nuclear stress test in 04/2018 showed no ischemia. -Patient continues on aspirin, Imdur, statin and Zetia, long-acting nitrate.  No beta-blocker due to nocturnal bradycardia and fatigue. -No chest pain.  Hypertension -On amlodipine 10 mg, hydrochlorothiazide 25 mg, ramipril 10 mg twice daily, Imdur 30 mg -Recent labs on 06/27/2019 showed normal creatinine 1.29 and potassium 4.1.  GFR 55.6 -Blood pressures well controlled  CKD stage III -Recent labs with GFR 55.6.  May be related to his AKI with recent COVID-19 infection.. -Continue to monitor  Hyperlipidemia, LDL goal less  than 70 -On rosuvastatin 40 mg daily -Lipid panel 05/31/2018 with LDL 62 -Update lipids  Carotid artery disease -We have right carotid endarterectomy. -Carotid Dopplers from 04/2018 showed bilateral 1-39% stenosis.   Medication Adjustments/Labs and Tests Ordered: Current medicines are reviewed at length with the patient today.  Concerns regarding medicines are outlined above. Labs and tests ordered and medication changes are outlined in the patient instructions below:  There are no Patient Instructions on file for this visit.   Signed, Daune Perch, NP  06/29/2019 9:47 AM    [facility]   I have personally seen and examined this patient. I agree with the assessment and plan as outlined above.  He presents to the office today with c/o weakness, dizziness and dyspnea. He is found to be in a rapid narrow complex rhythm which appears to be atrial flutter on our EKG in the office. This could represent SVT. BP is 120/60. He does not appear ill but is not feeling well.  My exam:  General: Well developed, well nourished, NAD  HEENT: OP clear, mucus membranes moist  SKIN: warm, dry. No rashes. Neuro: No focal deficits  Musculoskeletal: Muscle strength 5/5 all ext  Psychiatric: Mood and affect normal  Neck: No JVD, no carotid bruits, no thyromegaly, no lymphadenopathy.  Lungs:Clear bilaterally, no wheezes, rhonci, crackles Cardiovascular: Regular, tachy.  Abdomen:Soft. Bowel sounds present. Non-tender.  Extremities: No lower extremity edema. Pulses are 2 + in the bilateral DP/PT. Plan: Will admit to [facility] for management of atrial flutter/SVT. Plan to continue Eliquis. Would only cardiovert if he became unstable as he has had interruption in his Eliquis recently. I would start Cardizem drip as BP tolerates for rate control. Our inpatient team will see him in the ED.   Lauree Chandler 06/29/2019 11:17 AM

## 2019-06-29 NOTE — Consult Note (Addendum)
ELECTROPHYSIOLOGY CONSULT NOTE    Patient ID: [name] MRN: [id number], DOB/AGE: 1951/05/10 68 y.o.  Admit date: 06/29/2019 Date of Consult: 06/29/2019  Primary Physician: [name], [place] Primary Cardiologist: Angelena Form Electrophysiologist: Haneef Hallquist (new this admission)  Patient Profile: [name] is a 68 y.o. male with a history of CAD, OSA on CPAP, HTN, hyperlipidemia, GERD who is being seen today for the evaluation of atrial flutter with RVR at the request of Dr Angelena Form.  HPI:  [name] is a 68 y.o. male with the above past medical history. He was diagnosed with atrial fibrillation earlier this year while in [place], [place]. He was seen by the AF clinic and started on Eliquis. He reports compliance with no missed doses. He saw his PCP earlier this week who noted recurrent AF and scheduled outpatient follow up with cardiology today. In the office, he was in atrial flutter with RVR and asked to present to the ER for further evaluation. He became hypotensive and EP has been asked to evaluate. On exam, he is pleasant and in no distress. He has had progressive weakness and shortness of breath with exertion this week.   He denies palpitations, PND, orthopnea, nausea, vomiting, syncope, edema, weight gain, or early satiety.  Past Medical History:  Diagnosis Date  . Atrial fibrillation ([place])   . Bradycardia   . Carotid artery disease ([place])    Right CEA;  dopplers 6/12:  0-39% bilat ICA  . Coronary artery disease    s/p BMS to RCA 2002; [place] 2008: oLAD 40%, pRCA stent ok with 20%, EF 60%); Myoview scan in March 2011 which was negative for ischemia   . GERD (gastroesophageal reflux disease)   . Hyperlipidemia   . Hypertension   . Myocardial infarction ([place])   . OSA on CPAP      Surgical History:  Past Surgical History:  Procedure Laterality Date  . CARDIAC CATHETERIZATION  06/01/2007    EF of 65% -- Nonobstructive CAD with 40% ostial stenosis in the LAD, no significant  obstruction in the circumflex artery, 20% narrowing within the stent in the proximal to mid right coronary and normal LV function  . CARDIAC CATHETERIZATION  02/02/2006   EF of 50%  . CARDIAC CATHETERIZATION  01/19/2002   EF of 60%  . CARDIAC CATHETERIZATION N/A 12/03/2015   Procedure: Left Heart Cath and Coronary Angiography;  Surgeon: Burnell Blanks, MD;  Location: [place] CV LAB;  Service: Cardiovascular;  Laterality: N/A;  . CAROTID ENDARTERECTOMY  06/26/2002   right  . CORONARY ANGIOPLASTY WITH STENT PLACEMENT  03/08/2000   Bare-metal stent in RCA, in [place]  . KNEE ARTHROSCOPY     right  . MOLE REMOVAL     Prior moles removed     (Not in a hospital admission)   Inpatient Medications:  . apixaban  5 mg Oral BID  . [START ON 06/30/2019] aspirin  81 mg Oral QHS  . diltiazem  10 mg Intravenous Once  . doxazosin  2 mg Oral QHS  . [START ON 06/30/2019] ezetimibe  10 mg Oral Daily  . [START ON 06/30/2019] finasteride  5 mg Oral Daily  . gabapentin  600 mg Oral QHS  . [START ON 06/30/2019] hydrochlorothiazide  25 mg Oral Daily  . [START ON 06/30/2019] isosorbide mononitrate  30 mg Oral Daily  . pantoprazole  40 mg Oral Daily  . [START ON 06/30/2019] potassium chloride  10 mEq Oral Daily  . ramipril  10 mg Oral BID  . rosuvastatin  40 mg Oral Daily    Allergies:  Allergies  Allergen Reactions  . Felodipine Swelling    Legs became swollen  . Niacin Other (See Comments)    Made patient feel flushed    Social History   Socioeconomic History  . Marital status: Married    Spouse name: Not on file  . Number of children: 3  . Years of education: 39  . Highest education level: High school graduate  Occupational History  . Occupation: Barista  . Financial resource strain: Not on file  . Food insecurity    Worry: Not on file    Inability: Not on file  . Transportation needs    Medical: Not on file    Non-medical: Not on file  Tobacco Use   . Smoking status: Former Smoker    Packs/day: 1.00    Years: 30.00    Pack years: 30.00    Types: Cigarettes    Quit date: 10/19/2000    Years since quitting: 18.7  . Smokeless tobacco: Current User    Types: Snuff  . Tobacco comment: Dips snuff.   Substance and Sexual Activity  . Alcohol use: Yes    Alcohol/week: 1.0 standard drinks    Types: 1 Cans of beer per week    Comment: Occasional beer  . Drug use: No  . Sexual activity: Yes  Lifestyle  . Physical activity    Days per week: Not on file    Minutes per session: Not on file  . Stress: Not on file  Relationships  . Social Herbalist on phone: Not on file    Gets together: Not on file    Attends religious service: Not on file    Active member of club or organization: Not on file    Attends meetings of clubs or organizations: Not on file    Relationship status: Not on file  . Intimate partner violence    Fear of current or ex partner: Not on file    Emotionally abused: Not on file    Physically abused: Not on file    Forced sexual activity: Not on file  Other Topics Concern  . Not on file  Social History Narrative   Lives with wife in a one story home.  Has 3 children.  Works in Architect.  Education: high school. Patient is right handed.     Family History  Problem Relation Age of Onset  . Heart failure Father   . Heart disease Father   . Peripheral vascular disease Mother        with pacemaker, and had a carotid endarterectomy  . Heart disease Mother   . Hypertension Brother   . Hypertension Brother      Review of Systems: All other systems reviewed and are otherwise negative except as noted above.  Physical Exam: Vitals:   06/29/19 1039 06/29/19 1045 06/29/19 1106 06/29/19 1115  BP: 101/69 94/67 97/75  97/65  Pulse: (!) 171 (!) 167 (!) 162 (!) 162  Resp: 19 15 (!) 22 17  Temp:      TempSrc:      SpO2: 98% 96% 98% 98%  Weight:      Height:        GEN- The patient is well appearing,  alert and oriented x 3 today.   HEENT: normocephalic, atraumatic; sclera clear, conjunctiva pink; hearing intact; oropharynx clear; neck supple Lungs- Clear to  ausculation bilaterally, normal work of breathing.  No wheezes, rales, rhonchi Heart- tachycardic regular rate and rhythm GI- soft, non-tender, non-distended, bowel sounds present Extremities- no clubbing, cyanosis, or edema  MS- no significant deformity or atrophy Skin- warm and dry, no rash or lesion Psych- euthymic mood, full affect Neuro- strength and sensation are intact  Labs:   Lab Results  Component Value Date   WBC 6.0 06/29/2019   HGB 13.1 06/29/2019   HCT 39.2 06/29/2019   MCV 89.3 06/29/2019   PLT 163 06/29/2019    Recent Labs  Lab 06/27/19 1230  NA 139  K 4.1  CL 103  CO2 27  BUN 22  CREATININE 1.29  CALCIUM 9.4  PROT 7.1  BILITOT 0.8  ALKPHOS 109  ALT 313*  AST 67*  GLUCOSE 117*      Radiology/Studies: Dg Chest 2 View  Result Date: 06/05/2019 CLINICAL DATA:  Syncope x2 over the past week. History of atrial fibrillation. EXAM: CHEST - 2 VIEW COMPARISON:  Single-view of the chest 05/30/2018. FINDINGS: Lungs clear. Heart size normal. Aortic atherosclerosis noted. No pneumothorax or pleural fluid. No acute or focal bony abnormality. IMPRESSION: No acute disease. Atherosclerosis. Electronically Signed   By: Inge Rise M.D.   On: 06/05/2019 15:50   EKG: atrial flutter with 2:1 conduction (personally reviewed)  TELEMETRY: atrial flutter with RVR (personally reviewed)  Assessment/Plan: 1.  Persistent atrial fibrillation/atrial flutter The patient has symptomatic persistent atrial fibrillation and atrial flutter He is becoming unstable with hypotension.  While admitted recently, his Eliquis was held. He was covered with IV Heparin and then Lovenox per discharge summary.  For now, recommend DCCV in the ER and discharge to home on Multaq 400mg  twice daily (Dr Rayann Heman discussed with Dr Dalia Heading).   Continue Eliquis uninterrupted for CHADS2VASC of at least 3 Follow up in AF clinic on Monday.  Would have a low threshold to pursue ablation. Follow up with Dr Rayann Heman in 3-4 weeks.    For questions or updates, please contact [place] Please consult [email or web address] for contact info under Cardiology/STEMI.  Signed, Chanetta Marshall, NP 06/29/2019 11:23 AM  I have seen, examined the patient, and reviewed the above assessment and plan.  Changes to above are made where necessary.  On exam,i RRR.  Pt now presents with rapidly conducting typical appearing atrial flutter.  He is appropriately anticoagulated.  We will therefore proceed with ED cardioversion protocol.  Start multaq as he has had recurrent afib and atrial flutter.  Follow-up next week in AF clinic.  I suspect ablation would be a good option for him in the near future.  Ok to discharge post cardioversion.  Co Sign: Thompson Grayer, MD 06/29/2019 6:09 PM

## 2019-06-29 NOTE — H&P (Addendum)
Cardiology H&P:    Date:  06/29/2019   ID:  [name], DOB 1951-06-20, MRN [id number]  PCP:  [name], FNP   Cardiologist:  Lauree Chandler, MD  Referring MD: [name],*      Chief Complaint  Patient presents with  . Hospitalization Follow-up  . Atrial Fibrillation    History of Present Illness:    [name] is a 68 y.o. male with a past medical history significant for CAD, paroxysmal atrial fibrillation on Eliquis, hypertension, sleep apnea, hyperlipidemia and carotid artery disease s/p right CEA.  In 2002 he had a bare-metal stent to the right coronary artery.  Cardiac catheterization in 2008 showed nonobstructive disease.  He had a negative Myoview in 2011.  Carotid duplex in 2012 showed mild disease and stable right CEA site.  Cardiac cath in February 2017 showed patent RCA stent and mild nonobstructive disease in the RCA, circumflex and LAD.  He was bradycardic when seen in the office 04/2018 for preoperative assessment and his atenolol was stopped.  He then began to have chest pressure and fatigue.  Nuclear stress test in 04/2018 showed no ischemia.  Echo in 06/05/2018 showed normal EF of 123456, grade 2 diastolic dysfunction and no significant valve disease.  Carotid artery Dopplers in 2019 showed mild bilateral carotid artery disease.  Cardiac monitor with nocturnal sinus bradycardia and short 3-second runs of SVT.  His beta-blocker was not restarted.  He was admitted to [facility] in 05/2018 with atrial fibrillation with RVR and was started on Eliquis.  He converted to sinus rhythm in the ED after 1 dose of IV metoprolol.  He was seen in the hospital by our EP team and they discussed Tikosyn versus as needed beta-blocker and he elected for as needed beta-blocker.  He has been seen since in the A. fib clinic.  The patient was last seen in the office on 12/22/2018 by Dr. Angelena Form at which time he was doing well with improved energy levels.  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 He works as a Optometrist since retirement.  He was found to be positive for COVID-19 infection.  He developed small bowel obstruction that improved with NG tube decompression.  Patient's chest x-ray was unremarkable and he never had respiratory symptoms from COVID.  The syncope was felt likely due to orthostatic hypotension from volume loss.  He did have acute kidney injury that resolved with IV fluid hydration.  He also had transaminitis felt to be due to COVID-19.  The patient was seen by his PCP on 06/27/2019 and was noted to have more lightheadedness and was also in atrial fibrillation with a rate of 93 bpm.  Today [name] presented to the office saying that he is feeling "real bad". He is very week, lightheaded, short of breath with walking. He has no chest pain or tightness. He is using a cane to walk today due to weakness. He does not usually use a cane. He has not had any metoprolol to use as he left it in [place] on a trip.   EKG today shows atrial flutter with 2-1 conduction, 168 bpm.       Past Medical History:  Diagnosis Date  . Atrial fibrillation ([place])   . Bradycardia   . Carotid artery disease ([place])    Right CEA;  dopplers 6/12:  0-39% bilat ICA  .  Coronary artery disease    s/p BMS to RCA 2002; [place] 2008: oLAD 40%, pRCA stent ok with 20%, EF 60%); Myoview scan in March 2011 which was negative for ischemia   . GERD (gastroesophageal reflux disease)   . Hyperlipidemia   . Hypertension   . Myocardial infarction ([place])   . OSA on CPAP          Past Surgical History:  Procedure Laterality Date  . CARDIAC CATHETERIZATION  06/01/2007    EF of 65% -- Nonobstructive CAD with 40% ostial stenosis in the LAD, no significant obstruction in the circumflex artery, 20% narrowing within the stent in the proximal to mid  right coronary and normal LV function  . CARDIAC CATHETERIZATION  02/02/2006   EF of 50%  . CARDIAC CATHETERIZATION  01/19/2002   EF of 60%  . CARDIAC CATHETERIZATION N/A 12/03/2015   Procedure: Left Heart Cath and Coronary Angiography;  Surgeon: Burnell Blanks, MD;  Location: [facility];  Service: Cardiovascular;  Laterality: N/A;  . CAROTID ENDARTERECTOMY  06/26/2002   right  . CORONARY ANGIOPLASTY WITH STENT PLACEMENT  03/08/2000   Bare-metal stent in RCA, in [place]  . KNEE ARTHROSCOPY     right  . MOLE REMOVAL     Prior moles removed    Current Medications: Active Medications      Current Meds  Medication Sig  . amLODipine (NORVASC) 10 MG tablet Take 1 tablet (10 mg total) by mouth daily.  Marland Kitchen apixaban (ELIQUIS) 5 MG TABS tablet Take 1 tablet (5 mg total) by mouth 2 (two) times daily.  Marland Kitchen aspirin 81 MG tablet Take 81 mg by mouth at bedtime.   Marland Kitchen b complex vitamins capsule Take 1 capsule by mouth daily.  Marland Kitchen doxazosin (CARDURA) 2 MG tablet Take 1 tablet (2 mg total) by mouth at bedtime.  Marland Kitchen ezetimibe (ZETIA) 10 MG tablet Take 1 tablet (10 mg total) by mouth daily.  . finasteride (PROSCAR) 5 MG tablet Take 1 tablet (5 mg total) by mouth daily.  Marland Kitchen gabapentin (NEURONTIN) 300 MG capsule Take 1 tablet in the morning and 2 tablet at bedtime  . hydrochlorothiazide (HYDRODIURIL) 25 MG tablet Take 1 tablet (25 mg total) by mouth daily. Needs visit for future refills.  . isosorbide mononitrate (IMDUR) 30 MG 24 hr tablet Take 1 tablet (30 mg total) by mouth daily. Needs visit for future refills.  . metoprolol tartrate (LOPRESSOR) 25 MG tablet Take 1 tablet (25 mg total) by mouth daily as needed (as needed for palpitations/afib.).  Marland Kitchen nitroGLYCERIN (NITROSTAT) 0.4 MG SL tablet Place 1 tablet (0.4 mg total) under the tongue every 5 (five) minutes as needed. Call 9-1-1 if need more than 2.  . ondansetron (ZOFRAN ODT) 8 MG disintegrating tablet Take 1 tablet (8 mg  total) by mouth every 8 (eight) hours as needed for nausea or vomiting.  . pantoprazole (PROTONIX) 40 MG tablet TAKE 1 TABLET BY MOUth twice a day as directed  . polyethylene glycol (MIRALAX / GLYCOLAX) 17 g packet Take 17 g by mouth daily as needed for mild constipation.  . potassium chloride (K-DUR) 10 MEQ tablet TAKE 1 TABLET(10 MEQ) BY MOUTH DAILY  . ramipril (ALTACE) 10 MG capsule Take 1 capsule (10 mg total) by mouth 2 (two) times daily.  . rosuvastatin (CRESTOR) 40 MG tablet Take 1 tablet (40 mg total) by mouth daily. Needs visit for future refills.  Marland Kitchen tiZANidine (ZANAFLEX) 2 MG tablet Take one tablet at bedtime as  needed for pain, if tolerating, ok to take twice daily as needed       Allergies:   Felodipine and Niacin   Social History        Socioeconomic History  . Marital status: Married    Spouse name: Not on file  . Number of children: 3  . Years of education: 83  . Highest education level: High school graduate  Occupational History  . Occupation: Barista  . Financial resource strain: Not on file  . Food insecurity    Worry: Not on file    Inability: Not on file  . Transportation needs    Medical: Not on file    Non-medical: Not on file  Tobacco Use  . Smoking status: Former Smoker    Packs/day: 1.00    Years: 30.00    Pack years: 30.00    Types: Cigarettes    Quit date: 10/19/2000    Years since quitting: 18.7  . Smokeless tobacco: Current User    Types: Snuff  . Tobacco comment: Dips snuff.   Substance and Sexual Activity  . Alcohol use: Yes    Alcohol/week: 1.0 standard drinks    Types: 1 Cans of beer per week    Comment: Occasional beer  . Drug use: No  . Sexual activity: Yes  Lifestyle  . Physical activity    Days per week: Not on file    Minutes per session: Not on file  . Stress: Not on file  Relationships  . Social Herbalist on phone: Not on file    Gets together: Not on  file    Attends religious service: Not on file    Active member of club or organization: Not on file    Attends meetings of clubs or organizations: Not on file    Relationship status: Not on file  Other Topics Concern  . Not on file  Social History Narrative   Lives with wife in a one story home.  Has 3 children.  Works in Architect.  Education: high school. Patient is right handed.     Family History: The patient's family history includes Heart disease in his father and mother; Heart failure in his father; Hypertension in his brother and brother; Peripheral vascular disease in his mother. ROS:   Please see the history of present illness.     All other systems reviewed and are negative.  EKGs/Labs/Other Studies Reviewed:    The following studies were reviewed today:  Echocardiogram 06/06/2019 IMPRESSIONS  1. The left ventricle has normal systolic function, with an ejection fraction of 60-65%. The cavity size was normal. There is mildly increased left ventricular wall thickness. Left ventricular diastolic Doppler parameters are consistent with impaired  relaxation. Indeterminate filling pressures The E/e' is 8-15. No evidence of left ventricular regional wall motion abnormalities. 2. The right ventricle has normal systolc function. The cavity was normal. There is no increase in right ventricular wall thickness. 3. The mitral valve is grossly normal. 4. The tricuspid valve was grossly normal. 5. The aortic valve is tricuspid Mild sclerosis of the aortic valve. No stenosis of the aortic valve.   EKG:  EKG is ordered today.  The ekg ordered today demonstrates EKG today shows atrial flutter with 2-1 conduction, 168 bpm.    Recent Labs: 06/05/2019: TSH 2.303 06/16/2019: B Natriuretic Peptide 74.1 06/22/2019: Magnesium 2.2 06/27/2019: ALT 313; BUN 22; Creatinine, Ser 1.29; Hemoglobin 13.1; Platelets 187.0; Potassium 4.1; Sodium 139  Recent Lipid Panel Labs  (Brief)          Component Value Date/Time   CHOL 114 05/31/2018 0539   TRIG 162 (H) 06/19/2019 0430   HDL 30 (L) 05/31/2018 0539   CHOLHDL 3.8 05/31/2018 0539   VLDL 22 05/31/2018 0539   LDLCALC 62 05/31/2018 0539      Physical Exam:    VS:  BP 120/60   Pulse (!) 168   Ht 6\' 1"  (1.854 m)   Wt 187 lb 1.9 oz (84.9 kg)   SpO2 97%   BMI 24.69 kg/m        Wt Readings from Last 3 Encounters:  06/29/19 187 lb 1.9 oz (84.9 kg)  06/27/19 190 lb 3.2 oz (86.3 kg)  06/21/19 179 lb 11.2 oz (81.5 kg)     Physical Exam  Constitutional: He is oriented to person, place, and time. He appears well-developed and well-nourished. No distress.  Patient appears weak, using a cane to walk.  HENT:  Head: Normocephalic and atraumatic.  Neck: Normal range of motion. No JVD present.  Cardiovascular: Regular rhythm. Tachycardia present.  Pulmonary/Chest: Effort normal and breath sounds normal. No respiratory distress. He has no wheezes. He has no rales.  Abdominal: Soft. Bowel sounds are normal.  Musculoskeletal: Normal range of motion.        General: No edema.  Neurological: He is alert and oriented to person, place, and time.  Skin: Skin is warm and dry.  Psychiatric: He has a normal mood and affect. His behavior is normal. Judgment and thought content normal.  Vitals reviewed.   ASSESSMENT:    1. Paroxysmal atrial fibrillation (HCC)   2. Typical atrial flutter ([place])   3. Coronary artery disease involving native coronary artery of native heart without angina pectoris   4. Essential (primary) hypertension   5. CKD stage 3 due to type 1 diabetes mellitus ([place])   6. Hyperlipidemia, unspecified hyperlipidemia type   7. Bilateral carotid artery disease, unspecified type ([place])    PLAN:    In order of problems listed above:  Paroxysmal atrial fibrillation/atrial flutter -Has been maintaining sinus rhythm.  Not on scheduled beta-blocker due to bradycardia.  He has  metoprolol to use as needed.  Patient seen by his PCP on 06/27/2019 and was in A. fib at the time, rate 93. -CHA2DS2/VAS Stroke Risk Score is 3 (Vasc Dz, HTN, Age).  He is anticoagulated with Eliquis for stroke risk reduction.  He was recently briefly off Eliquis during hospitalization with small bowel obstruction. -Today patient is very symptomatic, weak, lightheaded, short of breath with exertion, walking with a cane.  EKG shows atrial flutter with 2-1 AV conduction, 168 bpm.  Patient has not had his as needed beta-blocker to take as he left it in [place]. -Case discussed with Dr. Angelena Form who went in and spoke with the patient.  With patient being very symptomatic, we will have him admitted to the hospital with plans for Cardizem drip and EP to evaluate for consideration of further management of recurrent A. fib/flutter.  We will continue Eliquis.  CAD without angina -Most recent cath in 2017 showed stable CAD.  Nuclear stress test in 04/2018 showed no ischemia. -Patient continues on aspirin, Imdur, statin and Zetia, long-acting nitrate.  No beta-blocker due to nocturnal bradycardia and fatigue. -No chest pain.  Hypertension -On amlodipine 10 mg, hydrochlorothiazide 25 mg, ramipril 10 mg twice daily, Imdur 30 mg -Recent labs on 06/27/2019 showed normal creatinine 1.29 and potassium 4.1.  GFR 55.6 -Blood pressures well controlled  CKD stage III -Recent labs with GFR 55.6.  May be related to his AKI with recent COVID-19 infection.. -Continue to monitor  Hyperlipidemia, LDL goal less than 70 -On rosuvastatin 40 mg daily -Lipid panel 05/31/2018 with LDL 62 -Update lipids  Carotid artery disease -We have right carotid endarterectomy. -Carotid Dopplers from 04/2018 showed bilateral 1-39% stenosis.   Medication Adjustments/Labs and Tests Ordered: Current medicines are reviewed at length with the patient today.  Concerns regarding medicines are outlined above. Labs and tests ordered and  medication changes are outlined in the patient instructions below:  There are no Patient Instructions on file for this visit.   Signed, Daune Perch, NP  06/29/2019 9:47 AM    [facility]  I have personally seen and examined this patient. I agree with the assessment and plan as outlined above.  He presents to the office today with c/o weakness, dizziness and dyspnea. He is found to be in a rapid narrow complex rhythm which appears to be atrial flutter on our EKG in the office. This could represent SVT. BP is 120/60. He does not appear ill but is not feeling well.  My exam:  General: Well developed, well nourished, NAD  HEENT: OP clear, mucus membranes moist  SKIN: warm, dry. No rashes. Neuro: No focal deficits  Musculoskeletal: Muscle strength 5/5 all ext  Psychiatric: Mood and affect normal  Neck: No JVD, no carotid bruits, no thyromegaly, no lymphadenopathy.  Lungs:Clear bilaterally, no wheezes, rhonci, crackles Cardiovascular: Regular, tachy.  Abdomen:Soft. Bowel sounds present. Non-tender.  Extremities: No lower extremity edema. Pulses are 2 + in the bilateral DP/PT. Plan: Will admit to [facility] for management of atrial flutter/SVT. Plan to continue Eliquis. Would only cardiovert if he became unstable as he has had interruption in his Eliquis recently. I would start Cardizem drip as BP tolerates for rate control. Our inpatient team will see him in the ED.   Lauree Chandler 06/29/2019 11:17 AM

## 2019-06-29 NOTE — ED Notes (Signed)
Patient verbalizes understanding of discharge instructions. Opportunity for questioning and answers were provided. Armband removed by staff, pt discharged from ED.  

## 2019-06-29 NOTE — ED Notes (Signed)
Pt gave me permission to speak with daughter Crystal. Gave daughter update and she stated she will come pick pt up.

## 2019-06-29 NOTE — ED Notes (Signed)
EDP called cards PT cleared to go home D/c doc reviewed with patient and daughter She says she saw it on mychart  Reminded to pick up meds as prescribed

## 2019-06-29 NOTE — ED Notes (Signed)
Patient's daughter [name] here to pick him. Lab called about trop 83 EDP notified

## 2019-06-29 NOTE — ED Provider Notes (Addendum)
[facility] EMERGENCY DEPARTMENT Provider Note   CSN: [id number] Arrival date & time: 06/29/19  1011     History   Chief Complaint Chief Complaint  Patient presents with   Tachycardia    HPI [name] is a 68 y.o. male.     HPI  [name] is a 68 y.o. male with a history of CAD, OSA on CPAP, HTN, hyperlipidemia, GERD He was diagnosed with atrial fibrillation earlier this year while in [place], [place]. He was seen by the AF clinic and started on Eliquis. He reports compliance with no missed doses. He saw his PCP earlier this week who noted recurrent AF and scheduled outpatient follow up with cardiology today. In the office, he was in atrial flutter with RVR and asked to present to the ER for further evaluation.  Patient reports he occasionally has episodes of noting his heart to be racing or skipping.  However this is become more sustained recently.  He reports he went to bed feeling all right last night but awakened this morning with his heart racing and feeling somewhat weak and short of breath.  He was seen in the cardiologist office today and referred to the emergency department with a plan for admission.  Patient denies any active chest pain.  He reports he does however sometimes feel like there is a pressure or tightness when he is most symptomatic.  He has not had any syncopal episodes.  He has not had any fever or chills. Past Medical History:  Diagnosis Date   Atrial fibrillation ([place])    Bradycardia    Carotid artery disease ([place])    Right CEA;  dopplers 6/12:  0-39% bilat ICA   Coronary artery disease    s/p BMS to RCA 2002; [place] 2008: oLAD 40%, pRCA stent ok with 20%, EF 60%); Myoview scan in March 2011 which was negative for ischemia    GERD (gastroesophageal reflux disease)    Hyperlipidemia    Hypertension    Myocardial infarction ([place])    OSA on CPAP     Patient Active Problem List   Diagnosis Date Noted   Atrial fibrillation with RVR  ([place]) 06/29/2019   Syncope 06/05/2019   COVID-19 virus infection 06/05/2019   AKI (acute kidney injury) ([place]) 06/05/2019   Acute left ankle pain 08/05/2018   Atrial fibrillation ([place]) 05/30/2018   CAD in native artery    Preoperative clearance 04/26/2018   Bradycardia 04/26/2018   Dizziness 04/26/2018   Hypersomnia with sleep apnea 12/05/2015   Unstable angina (HCC) 12/02/2015   Plantar fasciitis, bilateral 12/13/2012   Carotid artery disease ([place]) 11/24/2010   BRADYCARDIA 04/12/2009   Coronary atherosclerosis 12/25/2008   Obstructive sleep apnea 02/03/2008   MYOCARDIAL INFARCTION 02/03/2008   Hyperlipidemia 10/08/2007   Essential hypertension 10/08/2007   GASTROESOPHAGEAL REFLUX DISEASE 10/08/2007    Past Surgical History:  Procedure Laterality Date   CARDIAC CATHETERIZATION  06/01/2007    EF of 65% -- Nonobstructive CAD with 40% ostial stenosis in the LAD, no significant obstruction in the circumflex artery, 20% narrowing within the stent in the proximal to mid right coronary and normal LV function   CARDIAC CATHETERIZATION  02/02/2006   EF of 50%   CARDIAC CATHETERIZATION  01/19/2002   EF of 60%   CARDIAC CATHETERIZATION N/A 12/03/2015   Procedure: Left Heart Cath and Coronary Angiography;  Surgeon: Burnell Blanks, MD;  Location: [facility];  Service: Cardiovascular;  Laterality: N/A;  CAROTID ENDARTERECTOMY  06/26/2002   right   CORONARY ANGIOPLASTY WITH STENT PLACEMENT  03/08/2000   Bare-metal stent in RCA, in [place] ARTHROSCOPY     right   MOLE REMOVAL     Prior moles removed        Home Medications    Prior to Admission medications   Medication Sig Start Date End Date Taking? Authorizing Provider  amLODipine (NORVASC) 10 MG tablet Take 1 tablet (10 mg total) by mouth daily. 06/27/19  Yes [name], FNP  apixaban (ELIQUIS) 5 MG TABS tablet Take 1 tablet (5 mg total) by mouth 2 (two) times daily.  06/27/19  Yes [name], FNP  aspirin 81 MG tablet Take 81 mg by mouth at bedtime.    Yes [provider]  b complex vitamins capsule Take 1 capsule by mouth daily.   Yes [provider]  doxazosin (CARDURA) 2 MG tablet Take 1 tablet (2 mg total) by mouth at bedtime. Patient taking differently: Take 2 mg by mouth daily.  06/27/19  Yes [name], FNP  ezetimibe (ZETIA) 10 MG tablet Take 1 tablet (10 mg total) by mouth daily. 06/27/19  Yes [name], FNP  finasteride (PROSCAR) 5 MG tablet Take 1 tablet (5 mg total) by mouth daily. 06/27/19  Yes [name], FNP  gabapentin (NEURONTIN) 300 MG capsule Take 1 tablet in the morning and 2 tablet at bedtime Patient taking differently: Take 300-600 mg by mouth See admin instructions. Take 300 mg in the morning and 600 mg in the evening 05/10/19  Yes Patel, Donika K, DO  hydrochlorothiazide (HYDRODIURIL) 25 MG tablet Take 1 tablet (25 mg total) by mouth daily. Needs visit for future refills. Patient taking differently: Take 12.5 mg by mouth daily.  06/27/19  Yes [name], FNP  isosorbide mononitrate (IMDUR) 30 MG 24 hr tablet Take 1 tablet (30 mg total) by mouth daily. Needs visit for future refills. Patient taking differently: Take 30 mg by mouth daily.  06/27/19  Yes [name], FNP  metoprolol tartrate (LOPRESSOR) 25 MG tablet Take 1 tablet (25 mg total) by mouth daily as needed (as needed for palpitations/afib.). 06/27/19 06/26/20 Yes [name], FNP  nitroGLYCERIN (NITROSTAT) 0.4 MG SL tablet Place 1 tablet (0.4 mg total) under the tongue every 5 (five) minutes as needed. Call 9-1-1 if need more than 2. 03/01/18  Yes Shambley, Delphia Grates, NP  pantoprazole (PROTONIX) 40 MG tablet TAKE 1 TABLET BY MOUth twice a day as directed Patient taking differently: Take 40 mg by mouth daily.  06/27/19  Yes [name], FNP  polyethylene glycol (MIRALAX / GLYCOLAX) 17 g  packet Take 17 g by mouth daily as needed for mild constipation. 06/23/19  Yes Bonnielee Haff, MD  potassium chloride (K-DUR) 10 MEQ tablet TAKE 1 TABLET(10 MEQ) BY MOUTH DAILY Patient taking differently: Take 10 mEq by mouth daily.  06/27/19  Yes [name], FNP  ramipril (ALTACE) 10 MG capsule Take 1 capsule (10 mg total) by mouth 2 (two) times daily. 06/27/19  Yes [name], FNP  rosuvastatin (CRESTOR) 40 MG tablet Take 1 tablet (40 mg total) by mouth daily. Needs visit for future refills. Patient taking differently: Take 40 mg by mouth daily.  06/27/19  Yes [name], FNP  tiZANidine (ZANAFLEX) 2 MG tablet Take one tablet at bedtime as needed for pain, if tolerating, ok to take twice daily as needed 08/29/18  Yes Posey Pronto, Donika K, DO  dronedarone (MULTAQ) 400 MG tablet Take 1 tablet (400 mg total) by mouth 2 (two) times daily with a meal. 06/29/19   Charlesetta Shanks, MD  ondansetron (ZOFRAN ODT) 8 MG disintegrating tablet Take 1 tablet (8 mg total) by mouth every 8 (eight) hours as needed for nausea or vomiting. 06/27/19   [name], FNP    Family History Family History  Problem Relation Age of Onset   Heart failure Father    Heart disease Father    Peripheral vascular disease Mother        with pacemaker, and had a carotid endarterectomy   Heart disease Mother    Hypertension Brother    Hypertension Brother     Social History Social History   Tobacco Use   Smoking status: Former Smoker    Packs/day: 1.00    Years: 30.00    Pack years: 30.00    Types: Cigarettes    Quit date: 10/19/2000    Years since quitting: 18.7   Smokeless tobacco: Current User    Types: Snuff   Tobacco comment: Dips snuff.   Substance Use Topics   Alcohol use: Yes    Alcohol/week: 1.0 standard drinks    Types: 1 Cans of beer per week    Comment: Occasional beer   Drug use: No     Allergies   Felodipine and Niacin   Review of Systems Review of  Systems 10 Systems reviewed and are negative for acute change except as noted in the HPI.   Physical Exam Updated Vital Signs BP 105/67    Pulse 99    Temp 98.5 F (36.9 C) (Oral)    Resp 19    Ht 6\' 1"  (1.854 m)    Wt 89.8 kg    SpO2 98%    BMI 26.12 kg/m   Physical Exam Constitutional:      Appearance: Normal appearance.  HENT:     Head: Normocephalic and atraumatic.     Mouth/Throat:     Mouth: Mucous membranes are moist.     Pharynx: Oropharynx is clear.  Eyes:     Extraocular Movements: Extraocular movements intact.  Cardiovascular:     Comments: Extreme tachycardia.  Distal pulses are intact. Pulmonary:     Effort: Pulmonary effort is normal.     Breath sounds: Normal breath sounds.  Abdominal:     General: There is no distension.     Palpations: Abdomen is soft.     Tenderness: There is no abdominal tenderness. There is no guarding.  Musculoskeletal: Normal range of motion.        General: No swelling or tenderness.     Right lower leg: No edema.     Left lower leg: No edema.  Skin:    General: Skin is warm and dry.  Neurological:     General: No focal deficit present.     Mental Status: He is alert and oriented to person, place, and time.     Cranial Nerves: No cranial nerve deficit.     Coordination: Coordination normal.  Psychiatric:        Mood and Affect: Mood normal.      ED Treatments / Results  Labs (all labs ordered are listed, but only abnormal results are displayed) Labs Reviewed  BASIC METABOLIC PANEL - Abnormal; Notable for the following components:      Result Value   CO2 21 (*)    Glucose, Bld 126 (*)  Calcium 8.6 (*)    All other components within normal limits  TROPONIN I (HIGH SENSITIVITY) - Abnormal; Notable for the following components:   Troponin I (High Sensitivity) 37 (*)    All other components within normal limits  SARS CORONAVIRUS 2 (HOSPITAL ORDER, PERFORMED IN [facility])  CBC  MAGNESIUM  TSH  TROPONIN I  (HIGH SENSITIVITY)    EKG EKG Interpretation  Date/Time:  Thursday June 29 2019 11:55:02 EDT Ventricular Rate:  91 PR Interval:    QRS Duration: 87 QT Interval:  339 QTC Calculation: 417 R Axis:   14 Text Interpretation:  Sinus rhythm Low voltage, extremity leads SR, no ischemic changes Confirmed by Charlesetta Shanks [phone]) on 06/29/2019 1:42:10 PM   Radiology Dg Chest Portable 1 View  Result Date: 06/29/2019 CLINICAL DATA:  Chest pain. EXAM: PORTABLE CHEST 1 VIEW COMPARISON:  June 05, 2019 FINDINGS: External pacing pads overlie the thorax. Cardiomediastinal silhouette is normal. Mediastinal contours appear intact. Calcific atherosclerotic disease of the aorta. There is no evidence of focal airspace consolidation, pleural effusion or pneumothorax. Osseous structures are without acute abnormality. Soft tissues are grossly normal. IMPRESSION: No active disease. Electronically Signed   By: Fidela Salisbury M.D.   On: 06/29/2019 11:06    Procedures .Cardioversion  Date/Time: 06/29/2019 1:51 PM Performed by: Charlesetta Shanks, MD Authorized by: Charlesetta Shanks, MD   Consent:    Consent obtained:  Verbal and written   Consent given by:  Patient   Risks discussed:  Cutaneous burn, death, induced arrhythmia and pain   Alternatives discussed:  Rate-control medication, alternative treatment, referral, delayed treatment and observation Pre-procedure details:    Cardioversion basis:  Elective   Rhythm:  Atrial flutter   Electrode placement:  Anterior-posterior Patient sedated: Yes. Refer to sedation procedure documentation for details of sedation.  Attempt one:    Cardioversion mode:  Synchronous   Waveform:  Biphasic   Shock (Joules):  100   Shock outcome:  Conversion to normal sinus rhythm Post-procedure details:    Patient status:  Alert   Patient tolerance of procedure:  Tolerated well, no immediate complications .Sedation  Date/Time: 06/29/2019 1:52 PM Performed by:  Charlesetta Shanks, MD Authorized by: Charlesetta Shanks, MD   Consent:    Consent obtained:  Verbal and written   Consent given by:  Patient   Risks discussed:  Allergic reaction, prolonged hypoxia resulting in organ damage, prolonged sedation necessitating reversal, dysrhythmia, inadequate sedation, nausea, vomiting and respiratory compromise necessitating ventilatory assistance and intubation Universal protocol:    Immediately prior to procedure a time out was called: yes     Patient identity confirmation method:  Arm band and verbally with patient Indications:    Procedure performed:  Cardioversion   Procedure necessitating sedation performed by:  Physician performing sedation Pre-sedation assessment:    Time since last food or drink:  Midnight   ASA classification: class 3 - patient with severe systemic disease     Neck mobility: normal     Mouth opening:  3 or more finger widths   Thyromental distance:  4 finger widths   Mallampati score:  I - soft palate, uvula, fauces, pillars visible   Pre-sedation assessments completed and reviewed: airway patency, cardiovascular function, hydration status, mental status, nausea/vomiting, pain level, respiratory function and temperature   Immediate pre-procedure details:    Reviewed: vital signs and relevant labs/tests     Verified: bag valve mask available, emergency equipment available, intubation equipment available, IV patency confirmed, oxygen  available and suction available   Procedure details (see MAR for exact dosages):    Preoxygenation:  Nasal cannula   Sedation:  Etomidate   Intra-procedure monitoring:  Blood pressure monitoring, cardiac monitor, continuous capnometry, continuous pulse oximetry and frequent vital sign checks   Intra-procedure events: none     Intra-procedure management:  Airway repositioning   Total Provider sedation time (minutes):  15 Post-procedure details:    Attendance: Constant attendance by certified staff until  patient recovered     Recovery: Patient returned to pre-procedure baseline     Post-sedation assessments completed and reviewed: airway patency, cardiovascular function, hydration status, mental status, nausea/vomiting, pain level, respiratory function and temperature     Patient is stable for discharge or admission: yes     Patient tolerance:  Tolerated well, no immediate complications   (including critical care time) CRITICAL CARE Performed by: Charlesetta Shanks   Total critical care time: 60 minutes  Critical care time was exclusive of separately billable procedures and treating other patients.  Critical care was necessary to treat or prevent imminent or life-threatening deterioration.  Critical care was time spent personally by me on the following activities: development of treatment plan with patient and/or surrogate as well as nursing, discussions with consultants, evaluation of patient's response to treatment, examination of patient, obtaining history from patient or surrogate, ordering and performing treatments and interventions, ordering and review of laboratory studies, ordering and review of radiographic studies, pulse oximetry and re-evaluation of patient's condition.  Medications Ordered in ED Medications  diltiazem (CARDIZEM) 1 mg/mL load via infusion 10 mg (has no administration in time range)  diltiazem (CARDIZEM) 100 mg in dextrose 5% 126mL (1 mg/mL) infusion (0 mg/hr Intravenous Stopped 06/29/19 1203)  aspirin EC tablet 81 mg (has no administration in time range)  doxazosin (CARDURA) tablet 2 mg (has no administration in time range)  ezetimibe (ZETIA) tablet 10 mg (has no administration in time range)  hydrochlorothiazide (HYDRODIURIL) tablet 25 mg (has no administration in time range)  isosorbide mononitrate (IMDUR) 24 hr tablet 30 mg (has no administration in time range)  nitroGLYCERIN (NITROSTAT) SL tablet 0.4 mg (has no administration in time range)  ramipril (ALTACE)  capsule 10 mg (has no administration in time range)  rosuvastatin (CRESTOR) tablet 40 mg (has no administration in time range)  pantoprazole (PROTONIX) EC tablet 40 mg (has no administration in time range)  polyethylene glycol (MIRALAX / GLYCOLAX) packet 17 g (has no administration in time range)  finasteride (PROSCAR) tablet 5 mg (has no administration in time range)  apixaban (ELIQUIS) tablet 5 mg (has no administration in time range)  gabapentin (NEURONTIN) capsule 600 mg (has no administration in time range)  potassium chloride (K-DUR) CR tablet 10 mEq (has no administration in time range)  sodium chloride 0.9 % bolus 500 mL (has no administration in time range)  dronedarone (MULTAQ) tablet 400 mg (has no administration in time range)  sodium chloride 0.9 % bolus 500 mL (0 mLs Intravenous Stopped 06/29/19 1203)  sodium chloride 0.9 % bolus 500 mL (0 mLs Intravenous Stopped 06/29/19 1203)  LORazepam (ATIVAN) injection 0.5 mg (0.5 mg Intravenous Given 06/29/19 1144)  etomidate (AMIDATE) injection 10 mg (10 mg Intravenous Given 06/29/19 1147)     Initial Impression / Assessment and Plan / ED Course  I have reviewed the triage vital signs and the nursing notes.  Pertinent labs & imaging results that were available during my care of the patient were reviewed by me and considered in  my medical decision making (see chart for details).  Clinical Course as of Jun 29 1355  Thu Jun 29, 2019  1100 Consult: Reviewed with NP Charlotta Newton for cardiology.  Patient's blood pressures are in the mid [id number] systolic.  Agrees with fluid bolus before administration of Cardizem.  We did discuss a low dose of metoprolol versus Cardizem bolus and drip.  Patient however does have history of significant bradycardia in response to antiarrhythmics once he converts out of A. fib flutter.  EKG had been reviewed in the cardiology office and felt to be 2:1 flutter rather than SVT.  Patient does not have active chest pain,  has clear mental status and no respiratory distress.  We will proceed with administration of fluid bolus for pressure support and initiate the Cardizem drip without bolus.  If pressures remain stable and patient is responding well to Cardizem drip I may proceed with bolus.  I will be in constant contact with cardiology reporting patient's response and 1 of the in-hospital cardiology providers will be coming down to discuss the patient as well.   [MP]    Clinical Course User Index [MP] Charlesetta Shanks, MD       Patient was seen at outpatient cardiology clinic and referred to the emergency department.  Dr. Rayann Heman did consultation in the emergency department.  He did find that the patient is a good candidate for cardioversion.  Patient is already anticoagulated on Eliquis.  He is cardioverted without difficulty in the emergency department.  Tolerated procedure well.  Cardiology has an outpatient follow-up plan for starting Multitak and follow-up in A. fib clinic.  Mild elevation in troponin reviewed with NP Lynnell Jude, patient appropriate for discharge without concerns for mild elevation in troponin.  Final Clinical Impressions(s) / ED Diagnoses   Final diagnoses:  Typical atrial flutter (HCC)  Tachycardia with greater than 160 beats per minute    ED Discharge Orders         Ordered    dronedarone (MULTAQ) 400 MG tablet  2 times daily with meals,   Status:  Discontinued     06/29/19 1132    dronedarone (MULTAQ) 400 MG tablet  2 times daily with meals     06/29/19 1345           Charlesetta Shanks, MD 06/29/19 1357    Charlesetta Shanks, MD 06/29/19 1416    Charlesetta Shanks, MD 09/14/19 1449

## 2019-06-29 NOTE — Sedation Documentation (Signed)
Time out done This RN, Alwyn Pea, Dr. Gevena Barre, Lorriane Shire, RN present

## 2019-06-29 NOTE — ED Triage Notes (Signed)
Pt arrived by [facility] from cardiologist office, because pt in A-flutter w/rate of 170, bp 89/59. Pt is dizzy upon standing per EMS. Pt is A&Ox4. Pt is tachy 168. Pt is breathing normal.

## 2019-06-30 ENCOUNTER — Encounter: Payer: Self-pay | Admitting: Family

## 2019-06-30 ENCOUNTER — Telehealth: Payer: Self-pay

## 2019-06-30 NOTE — Telephone Encounter (Signed)
I spoke to the patient and his daughter in regards to [name].. They verbalized understanding.

## 2019-07-03 ENCOUNTER — Ambulatory Visit (HOSPITAL_COMMUNITY)
Admission: RE | Admit: 2019-07-03 | Discharge: 2019-07-03 | Disposition: A | Discharge status: Home / Self Care | Payer: Medicare Other | Source: Ambulatory Visit | Attending: Nurse Practitioner | Admitting: Nurse Practitioner

## 2019-07-03 ENCOUNTER — Other Ambulatory Visit: Payer: Self-pay

## 2019-07-03 ENCOUNTER — Other Ambulatory Visit: Payer: Self-pay | Admitting: Family

## 2019-07-03 ENCOUNTER — Encounter (HOSPITAL_COMMUNITY): Payer: Self-pay | Admitting: Nurse Practitioner

## 2019-07-03 ENCOUNTER — Other Ambulatory Visit (INDEPENDENT_AMBULATORY_CARE_PROVIDER_SITE_OTHER): Payer: Medicare Other

## 2019-07-03 VITALS — BP 118/64 | HR 90 | Ht 73.0 in | Wt 187.4 lb

## 2019-07-03 DIAGNOSIS — R7989 Other specified abnormal findings of blood chemistry: Secondary | ICD-10-CM

## 2019-07-03 DIAGNOSIS — Z7901 Long term (current) use of anticoagulants: Secondary | ICD-10-CM | POA: Diagnosis not present

## 2019-07-03 DIAGNOSIS — R945 Abnormal results of liver function studies: Secondary | ICD-10-CM | POA: Diagnosis not present

## 2019-07-03 DIAGNOSIS — Z8249 Family history of ischemic heart disease and other diseases of the circulatory system: Secondary | ICD-10-CM | POA: Insufficient documentation

## 2019-07-03 DIAGNOSIS — F17290 Nicotine dependence, other tobacco product, uncomplicated: Secondary | ICD-10-CM | POA: Insufficient documentation

## 2019-07-03 DIAGNOSIS — I2510 Atherosclerotic heart disease of native coronary artery without angina pectoris: Secondary | ICD-10-CM | POA: Diagnosis not present

## 2019-07-03 DIAGNOSIS — G4733 Obstructive sleep apnea (adult) (pediatric): Secondary | ICD-10-CM | POA: Insufficient documentation

## 2019-07-03 DIAGNOSIS — I480 Paroxysmal atrial fibrillation: Secondary | ICD-10-CM | POA: Diagnosis not present

## 2019-07-03 DIAGNOSIS — I252 Old myocardial infarction: Secondary | ICD-10-CM | POA: Insufficient documentation

## 2019-07-03 DIAGNOSIS — I6529 Occlusion and stenosis of unspecified carotid artery: Secondary | ICD-10-CM | POA: Insufficient documentation

## 2019-07-03 DIAGNOSIS — Z8619 Personal history of other infectious and parasitic diseases: Secondary | ICD-10-CM | POA: Insufficient documentation

## 2019-07-03 DIAGNOSIS — Z79899 Other long term (current) drug therapy: Secondary | ICD-10-CM | POA: Diagnosis not present

## 2019-07-03 DIAGNOSIS — Z955 Presence of coronary angioplasty implant and graft: Secondary | ICD-10-CM | POA: Insufficient documentation

## 2019-07-03 DIAGNOSIS — Z888 Allergy status to other drugs, medicaments and biological substances status: Secondary | ICD-10-CM | POA: Diagnosis not present

## 2019-07-03 DIAGNOSIS — I10 Essential (primary) hypertension: Secondary | ICD-10-CM | POA: Insufficient documentation

## 2019-07-03 DIAGNOSIS — K219 Gastro-esophageal reflux disease without esophagitis: Secondary | ICD-10-CM | POA: Insufficient documentation

## 2019-07-03 DIAGNOSIS — E785 Hyperlipidemia, unspecified: Secondary | ICD-10-CM | POA: Insufficient documentation

## 2019-07-03 LAB — COMPREHENSIVE METABOLIC PANEL
ALT: 79 U/L — ABNORMAL HIGH (ref 0–53)
AST: 28 U/L (ref 0–37)
Albumin: 3.9 g/dL (ref 3.5–5.2)
Alkaline Phosphatase: 70 U/L (ref 39–117)
BUN: 13 mg/dL (ref 6–23)
CO2: 30 mEq/L (ref 19–32)
Calcium: 9.8 mg/dL (ref 8.4–10.5)
Chloride: 104 mEq/L (ref 96–112)
Creatinine, Ser: 1.17 mg/dL (ref 0.40–1.50)
GFR: 62.25 mL/min (ref >60.00)
Glucose, Bld: 101 mg/dL — ABNORMAL HIGH (ref 70–99)
Potassium: 4.4 mEq/L (ref 3.5–5.1)
Sodium: 141 mEq/L (ref 135–145)
Total Bilirubin: 0.7 mg/dL (ref 0.2–1.2)
Total Protein: 6.8 g/dL (ref 6.0–8.3)

## 2019-07-03 NOTE — Progress Notes (Signed)
Primary Care Physician: [name], [place] Referring Physician: Dr. Rayann Heman, [facility] f/u   [name] is a 68 y.o. male with a h/o CAD s/p PCI,hypertension, hyperlipidemia, OSA,carotid stenosis s/p CEA,and GERD here with fatigue and shortness of breath and found to have new onset atrial fibrillation with rapid ventricular response. He was seen for preoperative clearance prior to EGD/colonoscopy and was noted to be bradycardic to the 40s 04/2018. At the time he was on atenolol 25mg  which was discontinued. On 48 hour Holter he was noted to be bradcyardic to the 40s overnight as well as 3 second runs of SVT. He has known OSA and is compliant with CPAP. Since that time he continued to be very fatigued. He is typically active and has been on FMLA from work because he cannot walk short distances without palpitations and shortness of breath. He also noted intermittent L sided chest discomfort with radiation to the L shoulder. He checks his HR and BP at home and his monitor sometimes reports and irregular heart rhythm. Repeat Lexiscan Myoview7/19/19 was negative for ischemia. LVEF was 56%. He does not smoke. He drinks ETOH 2-3 beers when fishing, otherwise limited EtOH. One coffee/day.  It was felt likely intermittent afib for the last 5 weeks and then persistent over the last few days. He converted to SR with 1 dose of IV metoprolol. He has had bradycardia in the past with scheduled Atenolol. Options discussed with  pt including prn BB therapy vs AAD therapy (Tikosyn only real option). He would like to avoid AADs which is reasonable. He was sent home on Metoprolol 25mg  prn for palpitations.  In the afib clinic, he feels well. No further afib. No bleeding issues with Eliquis 5 mg bid. He has metoprolol to use as needed, he has not had to use.   F/u in afib clinic, 10/2. He feels well. No further afib. No issues with bleeding anticoagulation. Reports a random  burring left chest that  goes down left arm that will last 15 mins. Is not tied to activity and had a low risk stress test in July. Has cut back on some alcohol.  F/u in afib clinic. 07/03/19, f/u per Dr. Rayann Heman  Pt was hospitalized late August thru early September for  bowel obstruction and Covid positive.He did not have any respiratory symptoms. He was sent to the hospital and was seen by Dr. Rayann Heman who recommended Multaq and cardioversion. H  had successful cardioversion was did not take but one pill of Multaq as it made him extremely weak. He did not take any further doses. He is in SR today. Dr. Rayann Heman did mention in his note that he would have a low threshold for ablation. Pt would like to pursue.   Today, he denies symptoms of palpitations, chest pain, shortness of breath, orthopnea, PND, lower extremity edema, dizziness, presyncope, syncope, or neurologic sequela. The patient is tolerating medications without difficulties and is otherwise without complaint today.   Past Medical History:  Diagnosis Date  . Atrial fibrillation ([place])   . Bradycardia   . Carotid artery disease ([place])    Right CEA;  dopplers 6/12:  0-39% bilat ICA  . Coronary artery disease    s/p BMS to RCA 2002; [place] 2008: oLAD 40%, pRCA stent ok with 20%, EF 60%); Myoview scan in March 2011 which was negative for ischemia   . GERD (gastroesophageal reflux disease)   . Hyperlipidemia   . Hypertension   . Myocardial infarction ([place])   .  OSA on CPAP    Past Surgical History:  Procedure Laterality Date  . CARDIAC CATHETERIZATION  06/01/2007    EF of 65% -- Nonobstructive CAD with 40% ostial stenosis in the LAD, no significant obstruction in the circumflex artery, 20% narrowing within the stent in the proximal to mid right coronary and normal LV function  . CARDIAC CATHETERIZATION  02/02/2006   EF of 50%  . CARDIAC CATHETERIZATION  01/19/2002   EF of 60%  . CARDIAC CATHETERIZATION N/A 12/03/2015   Procedure: Left Heart Cath and Coronary  Angiography;  Surgeon: Burnell Blanks, MD;  Location: [facility];  Service: Cardiovascular;  Laterality: N/A;  . CAROTID ENDARTERECTOMY  06/26/2002   right  . CORONARY ANGIOPLASTY WITH STENT PLACEMENT  03/08/2000   Bare-metal stent in RCA, in [place]  . KNEE ARTHROSCOPY     right  . MOLE REMOVAL     Prior moles removed    Current Outpatient Medications  Medication Sig Dispense Refill  . amLODipine (NORVASC) 10 MG tablet Take 1 tablet (10 mg total) by mouth daily. 90 tablet 3  . apixaban (ELIQUIS) 5 MG TABS tablet Take 1 tablet (5 mg total) by mouth 2 (two) times daily. 180 tablet 2  . aspirin 81 MG tablet Take 81 mg by mouth at bedtime.     Marland Kitchen b complex vitamins capsule Take 1 capsule by mouth daily.    Marland Kitchen doxazosin (CARDURA) 2 MG tablet Take 1 tablet (2 mg total) by mouth at bedtime. (Patient taking differently: Take 2 mg by mouth daily. ) 90 tablet 2  . dronedarone (MULTAQ) 400 MG tablet Take 1 tablet (400 mg total) by mouth 2 (two) times daily with a meal. 60 tablet 1  . ezetimibe (ZETIA) 10 MG tablet Take 1 tablet (10 mg total) by mouth daily. 90 tablet 3  . finasteride (PROSCAR) 5 MG tablet Take 1 tablet (5 mg total) by mouth daily. 90 tablet 2  . gabapentin (NEURONTIN) 300 MG capsule Take 1 tablet in the morning and 2 tablet at bedtime (Patient taking differently: Take 300-600 mg by mouth See admin instructions. Take 300 mg in the morning and 600 mg in the evening) 270 capsule 3  . hydrochlorothiazide (HYDRODIURIL) 25 MG tablet Take 1 tablet (25 mg total) by mouth daily. Needs visit for future refills. (Patient taking differently: Take 12.5 mg by mouth daily. ) 90 tablet 1  . isosorbide mononitrate (IMDUR) 30 MG 24 hr tablet Take 1 tablet (30 mg total) by mouth daily. Needs visit for future refills. (Patient taking differently: Take 30 mg by mouth daily. ) 90 tablet 2  . metoprolol tartrate (LOPRESSOR) 25 MG tablet Take 1 tablet (25 mg total) by mouth daily as needed  (as needed for palpitations/afib.). 60 tablet 3  . nitroGLYCERIN (NITROSTAT) 0.4 MG SL tablet Place 1 tablet (0.4 mg total) under the tongue every 5 (five) minutes as needed. Call 9-1-1 if need more than 2. 25 tablet 0  . ondansetron (ZOFRAN ODT) 8 MG disintegrating tablet Take 1 tablet (8 mg total) by mouth every 8 (eight) hours as needed for nausea or vomiting. 20 tablet 0  . pantoprazole (PROTONIX) 40 MG tablet TAKE 1 TABLET BY MOUth twice a day as directed (Patient taking differently: Take 40 mg by mouth daily. ) 180 tablet 3  . polyethylene glycol (MIRALAX / GLYCOLAX) 17 g packet Take 17 g by mouth daily as needed for mild constipation. 30 each 0  . potassium chloride (K-DUR)  10 MEQ tablet TAKE 1 TABLET(10 MEQ) BY MOUTH DAILY (Patient taking differently: Take 10 mEq by mouth daily. ) 90 tablet 0  . ramipril (ALTACE) 10 MG capsule Take 1 capsule (10 mg total) by mouth 2 (two) times daily. 180 capsule 1  . rosuvastatin (CRESTOR) 40 MG tablet Take 1 tablet (40 mg total) by mouth daily. Needs visit for future refills. (Patient taking differently: Take 40 mg by mouth daily. ) 90 tablet 3  . tiZANidine (ZANAFLEX) 2 MG tablet Take one tablet at bedtime as needed for pain, if tolerating, ok to take twice daily as needed 45 tablet 3   No current facility-administered medications for this encounter.     Allergies  Allergen Reactions  . Felodipine Swelling    Legs became swollen  . Niacin Other (See Comments)    Made patient feel flushed    Social History   Socioeconomic History  . Marital status: Married    Spouse name: Not on file  . Number of children: 3  . Years of education: 20  . Highest education level: High school graduate  Occupational History  . Occupation: Barista  . Financial resource strain: Not on file  . Food insecurity    Worry: Not on file    Inability: Not on file  . Transportation needs    Medical: Not on file    Non-medical: Not on file  Tobacco  Use  . Smoking status: Former Smoker    Packs/day: 1.00    Years: 30.00    Pack years: 30.00    Types: Cigarettes    Quit date: 10/19/2000    Years since quitting: 18.7  . Smokeless tobacco: Current User    Types: Snuff  . Tobacco comment: Dips snuff.   Substance and Sexual Activity  . Alcohol use: Yes    Alcohol/week: 1.0 standard drinks    Types: 1 Cans of beer per week    Comment: Occasional beer  . Drug use: No  . Sexual activity: Yes  Lifestyle  . Physical activity    Days per week: Not on file    Minutes per session: Not on file  . Stress: Not on file  Relationships  . Social Herbalist on phone: Not on file    Gets together: Not on file    Attends religious service: Not on file    Active member of club or organization: Not on file    Attends meetings of clubs or organizations: Not on file    Relationship status: Not on file  . Intimate partner violence    Fear of current or ex partner: Not on file    Emotionally abused: Not on file    Physically abused: Not on file    Forced sexual activity: Not on file  Other Topics Concern  . Not on file  Social History Narrative   Lives with wife in a one story home.  Has 3 children.  Works in Architect.  Education: high school. Patient is right handed.    Family History  Problem Relation Age of Onset  . Heart failure Father   . Heart disease Father   . Peripheral vascular disease Mother        with pacemaker, and had a carotid endarterectomy  . Heart disease Mother   . Hypertension Brother   . Hypertension Brother     ROS- All systems are reviewed and negative except as per the HPI above  Physical  Exam: Vitals:   07/03/19 1337  BP: 118/64  Pulse: 90  Weight: 85 kg  Height: 6\' 1"  (1.854 m)   Wt Readings from Last 3 Encounters:  07/03/19 85 kg  06/29/19 89.8 kg  06/29/19 84.9 kg    Labs: Lab Results  Component Value Date   NA 136 06/29/2019   K 5.1 06/29/2019   CL 105 06/29/2019   CO2 21  (L) 06/29/2019   GLUCOSE 126 (H) 06/29/2019   BUN 16 06/29/2019   CREATININE 1.18 06/29/2019   CALCIUM 8.6 (L) 06/29/2019   PHOS 4.5 06/22/2019   MG 1.9 06/29/2019   Lab Results  Component Value Date   INR 1.01 05/30/2018   Lab Results  Component Value Date   CHOL 114 05/31/2018   HDL 30 (L) 05/31/2018   LDLCALC 62 05/31/2018   TRIG 162 (H) 06/19/2019     GEN- The patient is well appearing, alert and oriented x 3 today.   Head- normocephalic, atraumatic Eyes-  Sclera clear, conjunctiva pink Ears- hearing intact Oropharynx- clear Neck- supple, no JVP Lymph- no cervical lymphadenopathy Lungs- Clear to ausculation bilaterally, normal work of breathing Heart- Regular rate and rhythm, no murmurs, rubs or gallops, PMI not laterally displaced GI- soft, NT, ND, + BS Extremities- no clubbing, cyanosis, or edema MS- no significant deformity or atrophy Skin- no rash or lesion Psych- euthymic mood, full affect Neuro- strength and sensation are intact  EKG-NSR at 90  bpm, pr int 154 ms, qrs int 90 ms, qtc 420 ms  Epic records reviewed Echo-Study Conclusions  - Left ventricle: The cavity size was normal. There was mild   concentric hypertrophy. Systolic function was normal. The   estimated ejection fraction was in the range of 60% to 65%. Wall   motion was normal; there were no regional wall motion   abnormalities. Features are consistent with a pseudonormal left   ventricular filling pattern, with concomitant abnormal relaxation   and increased filling pressure (grade 2 diastolic dysfunction). - Aortic valve: Trileaflet; mildly thickened, mildly calcified   leaflets. - Left atrium: The atrium was mildly dilated. - Tricuspid valve: There was trivial regurgitation. - Pulmonic valve: There was mild regurgitation.    Assessment and Plan: 1. Paroxysmal afib Recent cardioversion and in SR today but did not tolerate multaq He would like to pursue ablation Will arrange for a  virtual visit with Dr. Rayann Heman to discuss ablation He will continue to use CPAP, usually very compliant  He has metoprolol to use prn as he has brady on scheduled BB=  2. Chadavasc score of at least 3 Continue eliquis 5 mg bid General precautions with bleeding reviewed    Butch Penny C. Alfonzo Arca, [facility] [address] [place], [place] [address] [phone]

## 2019-07-05 ENCOUNTER — Ambulatory Visit: Payer: Medicare Other | Admitting: Physician Assistant

## 2019-07-06 ENCOUNTER — Ambulatory Visit: Payer: Medicare Other | Admitting: Cardiology

## 2019-07-17 ENCOUNTER — Telehealth: Payer: Self-pay

## 2019-07-17 NOTE — Telephone Encounter (Signed)
Spoke with pt regarding appt on 07/19/19. Pt stated he will check his vitals prior to his appt and did not have any questions at this moment.

## 2019-07-19 ENCOUNTER — Telehealth: Payer: Self-pay

## 2019-07-19 ENCOUNTER — Encounter: Payer: Self-pay | Admitting: Internal Medicine

## 2019-07-19 ENCOUNTER — Telehealth (INDEPENDENT_AMBULATORY_CARE_PROVIDER_SITE_OTHER): Payer: Medicare Other | Admitting: Internal Medicine

## 2019-07-19 VITALS — BP 120/72 | HR 60 | Ht 73.0 in | Wt 198.0 lb

## 2019-07-19 DIAGNOSIS — I480 Paroxysmal atrial fibrillation: Secondary | ICD-10-CM

## 2019-07-19 DIAGNOSIS — I2510 Atherosclerotic heart disease of native coronary artery without angina pectoris: Secondary | ICD-10-CM

## 2019-07-19 DIAGNOSIS — G4733 Obstructive sleep apnea (adult) (pediatric): Secondary | ICD-10-CM | POA: Diagnosis not present

## 2019-07-19 DIAGNOSIS — I483 Typical atrial flutter: Secondary | ICD-10-CM

## 2019-07-19 NOTE — H&P (View-Only) (Signed)
Electrophysiology TeleHealth Note   Due to national recommendations of social distancing due to COVID 19, an audio/video telehealth visit is felt to be most appropriate for this patient at this time.  See MyChart message from today for the patient's consent to telehealth for [name].    Date:  07/19/2019   ID:  [name], DOB 20-Jun-1951, MRN [id number]  Location: patient's home  Provider location:  [place]  Evaluation Performed: Follow-up visit  PCP:  [name], FNP   Electrophysiologist:  Dr Rayann Heman  Chief Complaint:  palpitations  History of Present Illness:    [name] is a 68 y.o. male who presents via telehealth conferencing today.  Since his recent ER visit during which he was cardioverted for  the patient reports doing reasonably well.   He did not tolerated multaq due to GI symptoms and weakness.  He continues to have episodes of atrial fibrillation and atrial flutter.  He has SOB and fatigue.  Today, he denies symptoms of palpitations, chest pain,   lower extremity edema, dizziness, presyncope, or syncope.  The patient is otherwise without complaint today.  The patient denies symptoms of fevers, chills, cough, or new SOB worrisome for COVID 19.  Past Medical History:  Diagnosis Date   Atrial fibrillation ([place])    Bradycardia    Carotid artery disease ([place])    Right CEA;  dopplers 6/12:  0-39% bilat ICA   Coronary artery disease    s/p BMS to RCA 2002; [place] 2008: oLAD 40%, pRCA stent ok with 20%, EF 60%); Myoview scan in March 2011 which was negative for ischemia    GERD (gastroesophageal reflux disease)    Hyperlipidemia    Hypertension    Myocardial infarction ([facility])    OSA on CPAP     Past Surgical History:  Procedure Laterality Date   CARDIAC CATHETERIZATION  06/01/2007    EF of 65% -- Nonobstructive CAD with 40% ostial stenosis in the LAD, no significant obstruction in the circumflex artery, 20% narrowing within the  stent in the proximal to mid right coronary and normal LV function   CARDIAC CATHETERIZATION  02/02/2006   EF of 50%   CARDIAC CATHETERIZATION  01/19/2002   EF of 60%   CARDIAC CATHETERIZATION N/A 12/03/2015   Procedure: Left Heart Cath and Coronary Angiography;  Surgeon: Burnell Blanks, MD;  Location: [facility];  Service: Cardiovascular;  Laterality: N/A;   CAROTID ENDARTERECTOMY  06/26/2002   right   CORONARY ANGIOPLASTY WITH STENT PLACEMENT  03/08/2000   Bare-metal stent in RCA, in [place] ARTHROSCOPY     right   MOLE REMOVAL     Prior moles removed    Current Outpatient Medications  Medication Sig Dispense Refill   amLODipine (NORVASC) 10 MG tablet Take 1 tablet (10 mg total) by mouth daily. 90 tablet 3   apixaban (ELIQUIS) 5 MG TABS tablet Take 1 tablet (5 mg total) by mouth 2 (two) times daily. 180 tablet 2   aspirin 81 MG tablet Take 81 mg by mouth at bedtime.      b complex vitamins capsule Take 1 capsule by mouth daily.     doxazosin (CARDURA) 2 MG tablet Take 2 mg by mouth daily.     ezetimibe (ZETIA) 10 MG tablet Take 1 tablet (10 mg total) by mouth daily. 90 tablet 3   finasteride (PROSCAR) 5 MG tablet Take 1 tablet (5 mg total) by mouth  daily. 90 tablet 2   gabapentin (NEURONTIN) 300 MG capsule Take 600 mg by mouth at bedtime.     hydrochlorothiazide (HYDRODIURIL) 25 MG tablet Take 25 mg by mouth daily.     isosorbide mononitrate (IMDUR) 30 MG 24 hr tablet Take 30 mg by mouth daily.     metoprolol tartrate (LOPRESSOR) 25 MG tablet Take 1 tablet (25 mg total) by mouth daily as needed (as needed for palpitations/afib.). 60 tablet 3   nitroGLYCERIN (NITROSTAT) 0.4 MG SL tablet Place 1 tablet (0.4 mg total) under the tongue every 5 (five) minutes as needed. Call 9-1-1 if need more than 2. 25 tablet 0   ondansetron (ZOFRAN ODT) 8 MG disintegrating tablet Take 1 tablet (8 mg total) by mouth every 8 (eight) hours as needed for nausea  or vomiting. 20 tablet 0   pantoprazole (PROTONIX) 40 MG tablet Take 40 mg by mouth daily.     polyethylene glycol (MIRALAX / GLYCOLAX) 17 g packet Take 17 g by mouth daily as needed for mild constipation. 30 each 0   potassium chloride (KLOR-CON) 10 MEQ tablet Take 10 mEq by mouth daily.     ramipril (ALTACE) 10 MG capsule Take 1 capsule (10 mg total) by mouth 2 (two) times daily. 180 capsule 1   rosuvastatin (CRESTOR) 40 MG tablet Take 40 mg by mouth daily.     tiZANidine (ZANAFLEX) 2 MG tablet Take one tablet at bedtime as needed for pain, if tolerating, ok to take twice daily as needed 45 tablet 3   No current facility-administered medications for this visit.     Allergies:   Felodipine and Niacin   Social History:  The patient  reports that he quit smoking about 18 years ago. His smoking use included cigarettes. He has a 30.00 pack-year smoking history. His smokeless tobacco use includes snuff. He reports current alcohol use of about 1.0 standard drinks of alcohol per week. He reports that he does not use drugs.   Family History:  The patient's family history includes Heart disease in his father and mother; Heart failure in his father; Hypertension in his brother and brother; Peripheral vascular disease in his mother.   ROS:  Please see the history of present illness.   All other systems are personally reviewed and negative.    Exam:    Vital Signs:  BP 120/72    Pulse 60    Ht 6\' 1"  (1.854 m)    Wt 198 lb (89.8 kg)    BMI 26.12 kg/m   Well sounding and appearing, alert and conversant, regular work of breathing,  good skin color Eyes- anicteric, neuro- grossly intact, skin- no apparent rash or lesions or cyanosis, mouth- oral mucosa is pink  Labs/Other Tests and Data Reviewed:    Recent Labs: 06/16/2019: B Natriuretic Peptide 74.1 06/29/2019: Hemoglobin 13.1; Magnesium 1.9; Platelets 163; TSH 1.913 07/03/2019: ALT 79; BUN 13; Creatinine, Ser 1.17; Potassium 4.4; Sodium 141    Wt Readings from Last 3 Encounters:  07/19/19 198 lb (89.8 kg)  07/03/19 187 lb 6.4 oz (85 kg)  06/29/19 198 lb (89.8 kg)      ASSESSMENT & PLAN:    1.  Paroxysmal atrial fibrillation and typical appearing atrial flutter The patient has symptomatic, recurrent paroxysmal atrial fibrillation and typical atrial flutter. he has failed medical therapy with multaq and metoprolol. Chads2vasc score is 3.  he is anticoagulated with eliquis . Therapeutic strategies for afib/ atrial flutter including medicine and ablation were discussed in  detail with the patient today. Risk, benefits, and alternatives to EP study and radiofrequency ablation for afib were also discussed in detail today. These risks include but are not limited to stroke, bleeding, vascular damage, tamponade, perforation, damage to the esophagus, lungs, and other structures, pulmonary vein stenosis, worsening renal function, and death. The patient understands these risk and wishes to proceed.  We will therefore proceed with catheter ablation at the next available time.  Carto, ICE, anesthesia are requested for the procedure.  Will also obtain cardiac CT prior to the procedure to exclude LAA thrombus and further evaluate atrial anatomy.  2. OSA Compliance with CPAP is encouraged  3. CAD No ischemic symptoms Stop ASA  4. HTN Stable No change required today    Patient Risk:  after full review of this patients clinical status, I feel that they are at high risk at this time.  Today, I have spent 15 minutes with the patient with telehealth technology discussing arrhythmia management .    SignedThompson Grayer, MD  07/19/2019 2:13 PM     [place] [address] [phone] (office) [phone] (fax)

## 2019-07-19 NOTE — Progress Notes (Signed)
Electrophysiology TeleHealth Note   Due to national recommendations of social distancing due to COVID 19, an audio/video telehealth visit is felt to be most appropriate for this patient at this time.  See MyChart message from today for the patient's consent to telehealth for [facility].    Date:  07/19/2019   ID:  [name], DOB Mar 21, 1951, MRN [id number]  Location: patient's home  Provider location:  [place]  Evaluation Performed: Follow-up visit  PCP:  Brandon Salvage, FNP   Electrophysiologist:  Dr Rayann Heman  Chief Complaint:  palpitations  History of Present Illness:    [name] is a 68 y.o. male who presents via telehealth conferencing today.  Since his recent ER visit during which he was cardioverted for  the patient reports doing reasonably well.   He did not tolerated multaq due to GI symptoms and weakness.  He continues to have episodes of atrial fibrillation and atrial flutter.  He has SOB and fatigue.  Today, he denies symptoms of palpitations, chest pain,   lower extremity edema, dizziness, presyncope, or syncope.  The patient is otherwise without complaint today.  The patient denies symptoms of fevers, chills, cough, or new SOB worrisome for COVID 19.  Past Medical History:  Diagnosis Date   Atrial fibrillation ([place])    Bradycardia    Carotid artery disease ([place])    Right CEA;  dopplers 6/12:  0-39% bilat ICA   Coronary artery disease    s/p BMS to RCA 2002; [place] 2008: oLAD 40%, pRCA stent ok with 20%, EF 60%); Myoview scan in March 2011 which was negative for ischemia    GERD (gastroesophageal reflux disease)    Hyperlipidemia    Hypertension    Myocardial infarction (H[facility])    OSA on CPAP     Past Surgical History:  Procedure Laterality Date   CARDIAC CATHETERIZATION  06/01/2007    EF of 65% -- Nonobstructive CAD with 40% ostial stenosis in the LAD, no significant obstruction in the circumflex artery, 20% narrowing within the  stent in the proximal to mid right coronary and normal LV function   CARDIAC CATHETERIZATION  02/02/2006   EF of 50%   CARDIAC CATHETERIZATION  01/19/2002   EF of 60%   CARDIAC CATHETERIZATION N/A 12/03/2015   Procedure: Left Heart Cath and Coronary Angiography;  Surgeon: Burnell Blanks, MD;  Location: [place] [facility];  Service: Cardiovascular;  Laterality: N/A;   CAROTID ENDARTERECTOMY  06/26/2002   right   CORONARY ANGIOPLASTY WITH STENT PLACEMENT  03/08/2000   Bare-metal stent in RCA, in [place] ARTHROSCOPY     right   MOLE REMOVAL     Prior moles removed    Current Outpatient Medications  Medication Sig Dispense Refill   amLODipine (NORVASC) 10 MG tablet Take 1 tablet (10 mg total) by mouth daily. 90 tablet 3   apixaban (ELIQUIS) 5 MG TABS tablet Take 1 tablet (5 mg total) by mouth 2 (two) times daily. 180 tablet 2   aspirin 81 MG tablet Take 81 mg by mouth at bedtime.      b complex vitamins capsule Take 1 capsule by mouth daily.     doxazosin (CARDURA) 2 MG tablet Take 2 mg by mouth daily.     ezetimibe (ZETIA) 10 MG tablet Take 1 tablet (10 mg total) by mouth daily. 90 tablet 3   finasteride (PROSCAR) 5 MG tablet Take 1 tablet (5 mg total) by mouth  daily. 90 tablet 2   gabapentin (NEURONTIN) 300 MG capsule Take 600 mg by mouth at bedtime.     hydrochlorothiazide (HYDRODIURIL) 25 MG tablet Take 25 mg by mouth daily.     isosorbide mononitrate (IMDUR) 30 MG 24 hr tablet Take 30 mg by mouth daily.     metoprolol tartrate (LOPRESSOR) 25 MG tablet Take 1 tablet (25 mg total) by mouth daily as needed (as needed for palpitations/afib.). 60 tablet 3   nitroGLYCERIN (NITROSTAT) 0.4 MG SL tablet Place 1 tablet (0.4 mg total) under the tongue every 5 (five) minutes as needed. Call 9-1-1 if need more than 2. 25 tablet 0   ondansetron (ZOFRAN ODT) 8 MG disintegrating tablet Take 1 tablet (8 mg total) by mouth every 8 (eight) hours as needed for nausea  or vomiting. 20 tablet 0   pantoprazole (PROTONIX) 40 MG tablet Take 40 mg by mouth daily.     polyethylene glycol (MIRALAX / GLYCOLAX) 17 g packet Take 17 g by mouth daily as needed for mild constipation. 30 each 0   potassium chloride (KLOR-CON) 10 MEQ tablet Take 10 mEq by mouth daily.     ramipril (ALTACE) 10 MG capsule Take 1 capsule (10 mg total) by mouth 2 (two) times daily. 180 capsule 1   rosuvastatin (CRESTOR) 40 MG tablet Take 40 mg by mouth daily.     tiZANidine (ZANAFLEX) 2 MG tablet Take one tablet at bedtime as needed for pain, if tolerating, ok to take twice daily as needed 45 tablet 3   No current facility-administered medications for this visit.     Allergies:   Felodipine and Niacin   Social History:  The patient  reports that he quit smoking about 18 years ago. His smoking use included cigarettes. He has a 30.00 pack-year smoking history. His smokeless tobacco use includes snuff. He reports current alcohol use of about 1.0 standard drinks of alcohol per week. He reports that he does not use drugs.   Family History:  The patient's family history includes Heart disease in his father and mother; Heart failure in his father; Hypertension in his brother and brother; Peripheral vascular disease in his mother.   ROS:  Please see the history of present illness.   All other systems are personally reviewed and negative.    Exam:    Vital Signs:  BP 120/72    Pulse 60    Ht 6\' 1"  (1.854 m)    Wt 198 lb (89.8 kg)    BMI 26.12 kg/m   Well sounding and appearing, alert and conversant, regular work of breathing,  good skin color Eyes- anicteric, neuro- grossly intact, skin- no apparent rash or lesions or cyanosis, mouth- oral mucosa is pink  Labs/Other Tests and Data Reviewed:    Recent Labs: 06/16/2019: B Natriuretic Peptide 74.1 06/29/2019: Hemoglobin 13.1; Magnesium 1.9; Platelets 163; TSH 1.913 07/03/2019: ALT 79; BUN 13; Creatinine, Ser 1.17; Potassium 4.4; Sodium 141    Wt Readings from Last 3 Encounters:  07/19/19 198 lb (89.8 kg)  07/03/19 187 lb 6.4 oz (85 kg)  06/29/19 198 lb (89.8 kg)      ASSESSMENT & PLAN:    1.  Paroxysmal atrial fibrillation and typical appearing atrial flutter The patient has symptomatic, recurrent paroxysmal atrial fibrillation and typical atrial flutter. he has failed medical therapy with multaq and metoprolol. Chads2vasc score is 3.  he is anticoagulated with eliquis . Therapeutic strategies for afib/ atrial flutter including medicine and ablation were discussed in  detail with the patient today. Risk, benefits, and alternatives to EP study and radiofrequency ablation for afib were also discussed in detail today. These risks include but are not limited to stroke, bleeding, vascular damage, tamponade, perforation, damage to the esophagus, lungs, and other structures, pulmonary vein stenosis, worsening renal function, and death. The patient understands these risk and wishes to proceed.  We will therefore proceed with catheter ablation at the next available time.  Carto, ICE, anesthesia are requested for the procedure.  Will also obtain cardiac CT prior to the procedure to exclude LAA thrombus and further evaluate atrial anatomy.  2. OSA Compliance with CPAP is encouraged  3. CAD No ischemic symptoms Stop ASA  4. HTN Stable No change required today    Patient Risk:  after full review of this patients clinical status, I feel that they are at high risk at this time.  Today, I have spent 15 minutes with the patient with telehealth technology discussing arrhythmia management .    SignedThompson Grayer, MD  07/19/2019 2:13 PM     [place] [address] [phone] (office) [phone] (fax)

## 2019-07-19 NOTE — Telephone Encounter (Signed)
-----   Message from Thompson Grayer, MD sent at 07/19/2019  2:28 PM EDT ----- Afib ablation  C/I/A  Cardiac CT

## 2019-07-19 NOTE — Telephone Encounter (Signed)
Outreach made to Pt.  Pt scheduled for afib ablation on 08/01/2019 at 10:30 am.  Will try to get labs/covid test/cardiac CT on July 28, 2019 d/t Pt residence and length of drive.  Will schedule labs and covid test after cardiac CT scheduled.  Will need to provide date/times of all pre work up.  Spoke with Pt and his daughter per his request.  Per daughter she has access to his MyChart and can see his instruction letters .

## 2019-07-27 ENCOUNTER — Telehealth (HOSPITAL_COMMUNITY): Payer: Self-pay | Admitting: Emergency Medicine

## 2019-07-27 NOTE — Telephone Encounter (Signed)
Reaching out to patient to offer assistance regarding upcoming cardiac imaging study; pt verbalizes understanding of appt date/time, parking situation and where to check in, pre-test NPO status and medications ordered, and verified current allergies; name and call back number provided for further questions should they arise Shamirah Ivan RN Navigator Cardiac Imaging [facility] [phone] office [phone] cell 

## 2019-07-28 ENCOUNTER — Other Ambulatory Visit: Payer: Self-pay

## 2019-07-28 ENCOUNTER — Encounter (HOSPITAL_COMMUNITY): Payer: Self-pay

## 2019-07-28 ENCOUNTER — Ambulatory Visit (HOSPITAL_COMMUNITY)
Admission: RE | Admit: 2019-07-28 | Discharge: 2019-07-28 | Disposition: A | Discharge status: Home / Self Care | Payer: Medicare Other | Source: Ambulatory Visit | Attending: Internal Medicine | Admitting: Internal Medicine

## 2019-07-28 ENCOUNTER — Other Ambulatory Visit: Payer: Medicare Other | Admitting: *Deleted

## 2019-07-28 ENCOUNTER — Other Ambulatory Visit (HOSPITAL_COMMUNITY): Payer: Medicare Other

## 2019-07-28 DIAGNOSIS — I480 Paroxysmal atrial fibrillation: Secondary | ICD-10-CM

## 2019-07-28 LAB — BASIC METABOLIC PANEL
BUN/Creatinine Ratio: 15 (ref 10–24)
BUN: 15 mg/dL (ref 8–27)
CO2: 25 mmol/L (ref 20–29)
Calcium: 9.3 mg/dL (ref 8.6–10.2)
Chloride: 106 mmol/L (ref 96–106)
Creatinine, Ser: 1.01 mg/dL (ref 0.76–1.27)
GFR calc Af Amer: 89 mL/min/{1.73_m2} (ref >59)
GFR calc non Af Amer: 77 mL/min/{1.73_m2} (ref >59)
Glucose: 101 mg/dL — ABNORMAL HIGH (ref 65–99)
Potassium: 4.4 mmol/L (ref 3.5–5.2)
Sodium: 143 mmol/L (ref 134–144)

## 2019-07-28 LAB — CBC WITH DIFFERENTIAL/PLATELET
Basophils Absolute: 0 10*3/uL (ref 0.0–0.2)
Basos: 1 %
EOS (ABSOLUTE): 0.1 10*3/uL (ref 0.0–0.4)
Eos: 4 %
Hematocrit: 33 % — ABNORMAL LOW (ref 37.5–51.0)
Hemoglobin: 11 g/dL — ABNORMAL LOW (ref 13.0–17.7)
Immature Grans (Abs): 0 10*3/uL (ref 0.0–0.1)
Immature Granulocytes: 0 %
Lymphocytes Absolute: 1 10*3/uL (ref 0.7–3.1)
Lymphs: 26 %
MCH: 30.2 pg (ref 26.6–33.0)
MCHC: 33.3 g/dL (ref 31.5–35.7)
MCV: 91 fL (ref 79–97)
Monocytes Absolute: 0.6 10*3/uL (ref 0.1–0.9)
Monocytes: 15 %
Neutrophils Absolute: 2.2 10*3/uL (ref 1.4–7.0)
Neutrophils: 54 %
Platelets: 173 10*3/uL (ref 150–450)
RBC: 3.64 x10E6/uL — ABNORMAL LOW (ref 4.14–5.80)
RDW: 13.4 % (ref 11.6–15.4)
WBC: 4 10*3/uL (ref 3.4–10.8)

## 2019-07-28 MED ORDER — IOHEXOL 350 MG/ML SOLN
80.0000 mL | Freq: Once | INTRAVENOUS | Status: AC | PRN
  Administered 2019-07-28: 80 mL via INTRAVENOUS

## 2019-07-28 NOTE — Progress Notes (Signed)
Contacted patient due to previous positive COVID test in August per policy, patient does not need to be retested for covid prior to procedure on Tuesday next week, Cath Lab Coordinator Anderson Malta, RN made aware

## 2019-08-01 ENCOUNTER — Ambulatory Visit (HOSPITAL_COMMUNITY): Payer: Medicare Other | Admitting: Certified Registered Nurse Anesthetist

## 2019-08-01 ENCOUNTER — Encounter (HOSPITAL_COMMUNITY)
Admission: RE | Disposition: A | Discharge status: Home / Self Care | Payer: Self-pay | Source: Home / Self Care | Attending: Internal Medicine

## 2019-08-01 ENCOUNTER — Other Ambulatory Visit: Payer: Self-pay

## 2019-08-01 ENCOUNTER — Encounter: Payer: Self-pay | Admitting: Family

## 2019-08-01 ENCOUNTER — Ambulatory Visit (HOSPITAL_COMMUNITY)
Admission: RE | Admit: 2019-08-01 | Discharge: 2019-08-01 | Disposition: A | Discharge status: Home / Self Care | Payer: Medicare Other | Attending: Internal Medicine | Admitting: Internal Medicine

## 2019-08-01 ENCOUNTER — Encounter (HOSPITAL_COMMUNITY): Payer: Self-pay | Admitting: Anesthesiology

## 2019-08-01 DIAGNOSIS — K219 Gastro-esophageal reflux disease without esophagitis: Secondary | ICD-10-CM | POA: Diagnosis not present

## 2019-08-01 DIAGNOSIS — Z8249 Family history of ischemic heart disease and other diseases of the circulatory system: Secondary | ICD-10-CM | POA: Diagnosis not present

## 2019-08-01 DIAGNOSIS — I252 Old myocardial infarction: Secondary | ICD-10-CM | POA: Diagnosis not present

## 2019-08-01 DIAGNOSIS — I483 Typical atrial flutter: Secondary | ICD-10-CM | POA: Diagnosis not present

## 2019-08-01 DIAGNOSIS — Z87891 Personal history of nicotine dependence: Secondary | ICD-10-CM | POA: Insufficient documentation

## 2019-08-01 DIAGNOSIS — I2510 Atherosclerotic heart disease of native coronary artery without angina pectoris: Secondary | ICD-10-CM | POA: Insufficient documentation

## 2019-08-01 DIAGNOSIS — Z7901 Long term (current) use of anticoagulants: Secondary | ICD-10-CM | POA: Diagnosis not present

## 2019-08-01 DIAGNOSIS — Z955 Presence of coronary angioplasty implant and graft: Secondary | ICD-10-CM | POA: Insufficient documentation

## 2019-08-01 DIAGNOSIS — Z79899 Other long term (current) drug therapy: Secondary | ICD-10-CM | POA: Insufficient documentation

## 2019-08-01 DIAGNOSIS — Z7982 Long term (current) use of aspirin: Secondary | ICD-10-CM | POA: Diagnosis not present

## 2019-08-01 DIAGNOSIS — E785 Hyperlipidemia, unspecified: Secondary | ICD-10-CM | POA: Insufficient documentation

## 2019-08-01 DIAGNOSIS — I10 Essential (primary) hypertension: Secondary | ICD-10-CM | POA: Insufficient documentation

## 2019-08-01 DIAGNOSIS — G4733 Obstructive sleep apnea (adult) (pediatric): Secondary | ICD-10-CM | POA: Diagnosis not present

## 2019-08-01 DIAGNOSIS — Z888 Allergy status to other drugs, medicaments and biological substances status: Secondary | ICD-10-CM | POA: Insufficient documentation

## 2019-08-01 DIAGNOSIS — I480 Paroxysmal atrial fibrillation: Secondary | ICD-10-CM

## 2019-08-01 HISTORY — PX: ATRIAL FIBRILLATION ABLATION: EP1191

## 2019-08-01 LAB — POCT ACTIVATED CLOTTING TIME
Activated Clotting Time: 230 seconds
Activated Clotting Time: 274 seconds

## 2019-08-01 SURGERY — ATRIAL FIBRILLATION ABLATION
Anesthesia: General

## 2019-08-01 MED ORDER — ISOPROTERENOL HCL 0.2 MG/ML IJ SOLN
INTRAMUSCULAR | Status: AC
  Filled 2019-08-01: qty 5

## 2019-08-01 MED ORDER — ISOPROTERENOL HCL 0.2 MG/ML IJ SOLN
INTRAVENOUS | Status: DC | PRN
  Administered 2019-08-01: 20 ug/min via INTRAVENOUS

## 2019-08-01 MED ORDER — SODIUM CHLORIDE 0.9 % IV SOLN
INTRAVENOUS | Status: DC | PRN
  Administered 2019-08-01: 40 ug/min via INTRAVENOUS

## 2019-08-01 MED ORDER — MIDAZOLAM HCL 2 MG/2ML IJ SOLN
INTRAMUSCULAR | Status: DC | PRN
  Administered 2019-08-01: 2 mg via INTRAVENOUS

## 2019-08-01 MED ORDER — ONDANSETRON HCL 4 MG/2ML IJ SOLN: 4.0000 mg | Freq: Four times a day (QID) | INTRAMUSCULAR | Status: DC | PRN

## 2019-08-01 MED ORDER — PROTAMINE SULFATE 10 MG/ML IV SOLN
INTRAVENOUS | Status: DC | PRN
  Administered 2019-08-01: 40 mg via INTRAVENOUS

## 2019-08-01 MED ORDER — HEPARIN SODIUM (PORCINE) 1000 UNIT/ML IJ SOLN
INTRAMUSCULAR | Status: AC
  Filled 2019-08-01: qty 2

## 2019-08-01 MED ORDER — SODIUM CHLORIDE 0.9% FLUSH: 3.0000 mL | Freq: Two times a day (BID) | INTRAVENOUS | Status: DC

## 2019-08-01 MED ORDER — DEXAMETHASONE SODIUM PHOSPHATE 10 MG/ML IJ SOLN
INTRAMUSCULAR | Status: DC | PRN
  Administered 2019-08-01: 4 mg via INTRAVENOUS

## 2019-08-01 MED ORDER — SUCCINYLCHOLINE CHLORIDE 200 MG/10ML IV SOSY
PREFILLED_SYRINGE | INTRAVENOUS | Status: DC | PRN
  Administered 2019-08-01: 140 mg via INTRAVENOUS

## 2019-08-01 MED ORDER — HEPARIN (PORCINE) IN NACL 1000-0.9 UT/500ML-% IV SOLN
INTRAVENOUS | Status: DC | PRN
  Administered 2019-08-01: 500 mL

## 2019-08-01 MED ORDER — ONDANSETRON HCL 4 MG/2ML IJ SOLN
INTRAMUSCULAR | Status: DC | PRN
  Administered 2019-08-01: 4 mg via INTRAVENOUS

## 2019-08-01 MED ORDER — HEPARIN SODIUM (PORCINE) 1000 UNIT/ML IJ SOLN
INTRAMUSCULAR | Status: DC | PRN
  Administered 2019-08-01 (×2): 5000 [IU] via INTRAVENOUS

## 2019-08-01 MED ORDER — HEPARIN SODIUM (PORCINE) 1000 UNIT/ML IJ SOLN
INTRAMUSCULAR | Status: DC | PRN
  Administered 2019-08-01: 14000 [IU] via INTRAVENOUS
  Administered 2019-08-01 (×2): 1000 [IU] via INTRAVENOUS

## 2019-08-01 MED ORDER — EPHEDRINE SULFATE-NACL 50-0.9 MG/10ML-% IV SOSY
PREFILLED_SYRINGE | INTRAVENOUS | Status: DC | PRN
  Administered 2019-08-01: 5 mg via INTRAVENOUS

## 2019-08-01 MED ORDER — SODIUM CHLORIDE 0.9 % IV SOLN: 250.0000 mL | INTRAVENOUS | Status: DC | PRN

## 2019-08-01 MED ORDER — FENTANYL CITRATE (PF) 100 MCG/2ML IJ SOLN
INTRAMUSCULAR | Status: DC | PRN
  Administered 2019-08-01: 100 ug via INTRAVENOUS

## 2019-08-01 MED ORDER — ROCURONIUM BROMIDE 10 MG/ML (PF) SYRINGE
PREFILLED_SYRINGE | INTRAVENOUS | Status: DC | PRN
  Administered 2019-08-01: 70 mg via INTRAVENOUS
  Administered 2019-08-01: 20 mg via INTRAVENOUS

## 2019-08-01 MED ORDER — ACETAMINOPHEN 325 MG PO TABS: 650.0000 mg | ORAL_TABLET | ORAL | Status: DC | PRN

## 2019-08-01 MED ORDER — BUPIVACAINE HCL (PF) 0.25 % IJ SOLN
INTRAMUSCULAR | Status: DC | PRN
  Administered 2019-08-01: 30 mL

## 2019-08-01 MED ORDER — PROPOFOL 10 MG/ML IV BOLUS
INTRAVENOUS | Status: DC | PRN
  Administered 2019-08-01: 100 mg via INTRAVENOUS
  Administered 2019-08-01: 20 mg via INTRAVENOUS

## 2019-08-01 MED ORDER — BUPIVACAINE HCL (PF) 0.25 % IJ SOLN
INTRAMUSCULAR | Status: AC
  Filled 2019-08-01: qty 30

## 2019-08-01 MED ORDER — HEPARIN (PORCINE) IN NACL 1000-0.9 UT/500ML-% IV SOLN
INTRAVENOUS | Status: AC
  Filled 2019-08-01: qty 500

## 2019-08-01 MED ORDER — LIDOCAINE 2% (20 MG/ML) 5 ML SYRINGE
INTRAMUSCULAR | Status: DC | PRN
  Administered 2019-08-01: 20 mg via INTRAVENOUS

## 2019-08-01 MED ORDER — HYDROCODONE-ACETAMINOPHEN 5-325 MG PO TABS: 1.0000 | ORAL_TABLET | ORAL | Status: DC | PRN

## 2019-08-01 MED ORDER — SODIUM CHLORIDE 0.9 % IV SOLN
INTRAVENOUS | Status: DC
  Administered 2019-08-01: 11:00:00 via INTRAVENOUS

## 2019-08-01 SURGICAL SUPPLY — 21 items
BLANKET WARM UNDERBOD FULL ACC (MISCELLANEOUS) ×2 IMPLANT
CATH MAPPNG PENTARAY F 2-6-2MM (CATHETERS) IMPLANT
CATH SMTCH THERMOCOOL SF DF (CATHETERS) ×1 IMPLANT
CATH SOUNDSTAR ECO 8FR (CATHETERS) ×1 IMPLANT
CATH WEBSTER BI DIR CS D-F CRV (CATHETERS) ×1 IMPLANT
COVER SWIFTLINK CONNECTOR (BAG) ×2 IMPLANT
DEVICE CLOSURE PERCLS PRGLD 6F (VASCULAR PRODUCTS) IMPLANT
NDL BAYLIS TRANSSEPTAL 71CM (NEEDLE) IMPLANT
NEEDLE BAYLIS TRANSSEPTAL 71CM (NEEDLE) ×2 IMPLANT
PACK EP LATEX FREE (CUSTOM PROCEDURE TRAY) ×2
PACK EP LF (CUSTOM PROCEDURE TRAY) ×1 IMPLANT
PAD PRO RADIOLUCENT 2001M-C (PAD) ×2 IMPLANT
PATCH CARTO3 (PAD) ×2 IMPLANT
PENTARAY F 2-6-2MM (CATHETERS) ×2
PERCLOSE PROGLIDE 6F (VASCULAR PRODUCTS) ×6
SHEATH AVANTI 11F 11CM (SHEATH) ×2 IMPLANT
SHEATH PINNACLE 7F 10CM (SHEATH) ×2 IMPLANT
SHEATH PINNACLE 8F 10CM (SHEATH) ×2 IMPLANT
SHEATH PROBE COVER 6X72 (BAG) ×2 IMPLANT
SHEATH SWARTZ TS SL2 63CM 8.5F (SHEATH) ×1 IMPLANT
TUBING SMART ABLATE COOLFLOW (TUBING) ×2 IMPLANT

## 2019-08-01 NOTE — Interval H&P Note (Signed)
History and Physical Interval Note:  08/01/2019 10:38 AM  [name]  has presented today for surgery, with the diagnosis of afib.  The various methods of treatment have been discussed with the patient and family. After consideration of risks, benefits and other options for treatment, the patient has consented to  Procedure(s): ATRIAL FIBRILLATION ABLATION (N/A) as a surgical intervention.  The patient's history has been reviewed, patient examined, no change in status, stable for surgery.  I have reviewed the patient's chart and labs.  Questions were answered to the patient's satisfaction.    Cardiac CT reviewed with the patient in detail today.  He is very clear that he has been compliant with eliquis without interruption, last dose was last night.   Thompson Grayer

## 2019-08-01 NOTE — Transfer of Care (Signed)
Immediate Anesthesia Transfer of Care Note  Patient: [name]  Procedure(s) Performed: ATRIAL FIBRILLATION ABLATION (N/A )  Patient Location: Cath Lab  Anesthesia Type:General  Level of Consciousness: awake, alert  and oriented  Airway & Oxygen Therapy: Patient Spontanous Breathing and Patient connected to nasal cannula oxygen  Post-op Assessment: Report given to RN, Post -op Vital signs reviewed and stable and Patient moving all extremities  Post vital signs: Reviewed and stable  Last Vitals:  Vitals Value Taken Time  BP 120/65 08/01/19 1425  Temp 36.7 C 08/01/19 1418  Pulse 81 08/01/19 1426  Resp 13 08/01/19 1426  SpO2 98 % 08/01/19 1426  Vitals shown include unvalidated device data.  Last Pain:  Vitals:   08/01/19 1418  TempSrc: Temporal  PainSc: 0-No pain      Patients Stated Pain Goal: 5 ([id number])  Complications: No apparent anesthesia complications

## 2019-08-01 NOTE — Anesthesia Procedure Notes (Signed)
Procedure Name: Intubation Date/Time: 08/01/2019 11:14 AM Performed by: Leonor Liv, CRNA Pre-anesthesia Checklist: Patient identified, Emergency Drugs available, Suction available and Patient being monitored Patient Re-evaluated:Patient Re-evaluated prior to induction Oxygen Delivery Method: Circle System Utilized Preoxygenation: Pre-oxygenation with 100% oxygen Induction Type: IV induction and Rapid sequence Laryngoscope Size: Glidescope and 4 Grade View: Grade I Tube type: Oral Tube size: 7.5 mm Number of attempts: 1 Airway Equipment and Method: Stylet and Oral airway Placement Confirmation: ETT inserted through vocal cords under direct vision,  positive ETCO2 and breath sounds checked- equal and bilateral Secured at: 23 cm Tube secured with: Tape (left) Dental Injury: Teeth and Oropharynx as per pre-operative assessment

## 2019-08-01 NOTE — Anesthesia Preprocedure Evaluation (Addendum)
Anesthesia Evaluation  Patient identified by MRN, date of birth, ID band Patient awake    Reviewed: Allergy & Precautions, NPO status , Patient's Chart, lab work & pertinent test results  Airway Mallampati: I       Dental  (+) Upper Dentures, Lower Dentures   Pulmonary sleep apnea and Continuous Positive Airway Pressure Ventilation , former smoker,    breath sounds clear to auscultation       Cardiovascular hypertension, Pt. on medications and Pt. on home beta blockers + CAD, + Past MI and + Cardiac Stents  + dysrhythmias Atrial Fibrillation  Rhythm:Irregular Rate:Normal     Neuro/Psych negative neurological ROS  negative psych ROS   GI/Hepatic Neg liver ROS, GERD  Medicated,  Endo/Other  negative endocrine ROS  Renal/GU Renal disease     Musculoskeletal negative musculoskeletal ROS (+)   Abdominal Normal abdominal exam  (+)   Peds  Hematology negative hematology ROS (+)   Anesthesia Other Findings   Reproductive/Obstetrics                            Echo:  1. The left ventricle has normal systolic function, with an ejection fraction of 60-65%. The cavity size was normal. There is mildly increased left ventricular wall thickness. Left ventricular diastolic Doppler parameters are consistent with impaired  relaxation. Indeterminate filling pressures The E/e' is 8-15. No evidence of left ventricular regional wall motion abnormalities.  2. The right ventricle has normal systolc function. The cavity was normal. There is no increase in right ventricular wall thickness.  3. The mitral valve is grossly normal.  4. The tricuspid valve was grossly normal.  5. The aortic valve is tricuspid Mild sclerosis of the aortic valve. No stenosis of the aortic valve.  Anesthesia Physical Anesthesia Plan  ASA: III  Anesthesia Plan: General   Post-op Pain Management:    Induction: Intravenous  PONV Risk  Score and Plan: 3 and Ondansetron, Dexamethasone, Treatment may vary due to age or medical condition and Midazolam  Airway Management Planned: Oral ETT  Additional Equipment: None  Intra-op Plan:   Post-operative Plan: Extubation in OR  Informed Consent:   Plan Discussed with: CRNA  Anesthesia Plan Comments:         Anesthesia Quick Evaluation

## 2019-08-01 NOTE — Discharge Instructions (Signed)
Post procedure care instructions No driving for 4 days. No lifting over 5 lbs for 1 week. No vigorous or sexual activity for 1 week. You may return to work on 08/08/2019. Keep procedure site clean & dry. If you notice increased pain, swelling, bleeding or pus, call/return!  You may shower, but no soaking baths/hot tubs/pools for 1 week.   You have an appointment set up with the [facility].  Multiple studies have shown that being followed by a dedicated atrial fibrillation clinic in addition to the standard care you receive from your other physicians improves health. We believe that enrollment in the atrial fibrillation clinic will allow [place] to better care for you.   The phone number to the [facility] is [phone]. The clinic is staffed Monday through Friday from 8:30am to 5pm.  Parking Directions: The clinic is located in the Heart and Vascular Building connected to [facility]. 1)From [address] turn on to [address] and go to the 3rd entrance  (Heart and Vascular entrance) on the right. 2)Look to the right for Heart &Vascular Parking Garage. 3)A code for the entrance is required please call the clinic to receive this.   4)Take the elevators to the 1st floor. Registration is in the room with the glass walls at the end of the hallway.  If you have any trouble parking or locating the clinic, please dont hesitate to call [phone].  Cardiac Ablation, Care After This sheet gives you information about how to care for yourself after your procedure. Your health care provider may also give you more specific instructions. If you have problems or questions, contact your health care provider. What can I expect after the procedure? After the procedure, it is common to have:  Bruising around your puncture site.  Tenderness around your puncture site.  Skipped heartbeats.  Tiredness (fatigue).  Follow these instructions at home: Puncture site care    Follow instructions from your health care provider about how to take care of your puncture site. Make sure you: ? If present, leave stitches (sutures), skin glue, or adhesive strips in place. These skin closures may need to stay in place for up to 2 weeks. If adhesive strip edges start to loosen and curl up, you may trim the loose edges. Do not remove adhesive strips completely unless your health care provider tells you to do that.  Check your puncture site every day for signs of infection. Check for: ? Redness, swelling, or pain. ? Fluid or blood. If your puncture site starts to bleed, lie down on your back, apply firm pressure to the area, and contact your health care provider. ? Warmth. ? Pus or a bad smell. Driving  Do not drive for at least 4 days after your procedure or however long your health care provider recommends.  Do not drive or use heavy machinery while taking prescription pain medicine.  Do not drive for 24 hours if you were given a medicine to help you relax (sedative) during your procedure. Activity  Avoid activities that take a lot of effort for at least 7 days after your procedure.  Do not lift anything that is heavier than 5 lb (4.5 kg) for one week.   No sexual activity for 1 week.   Return to your normal activities as told by your health care provider. Ask your health care provider what activities are safe for you. General instructions  Take over-the-counter and prescription medicines only as told by your health care  provider.  Do not use any products that contain nicotine or tobacco, such as cigarettes and e-cigarettes. If you need help quitting, ask your health care provider.  You may shower after 24 hours, but Do not take baths, swim, or use a hot tub for 1 week.   Do not drink alcohol for 24 hours after your procedure.  Keep all follow-up visits as told by your health care provider. This is important. Contact a health care provider if:  You have  redness, mild swelling, or pain around your puncture site.  You have fluid or blood coming from your puncture site that stops after applying firm pressure to the area.  Your puncture site feels warm to the touch.  You have pus or a bad smell coming from your puncture site.  You have a fever.  You have chest pain or discomfort that spreads to your neck, jaw, or arm.  You are sweating a lot.  You feel nauseous.  You have a fast or irregular heartbeat.  You have shortness of breath.  You are dizzy or light-headed and feel the need to lie down.  You have pain or numbness in the arm or leg closest to your puncture site. Get help right away if:  Your puncture site suddenly swells.  Your puncture site is bleeding and the bleeding does not stop after applying firm pressure to the area. These symptoms may represent a serious problem that is an emergency. Do not wait to see if the symptoms will go away. Get medical help right away. Call your local emergency services (911 in the [place] Do not drive yourself to the hospital. Summary  After the procedure, it is normal to have bruising and tenderness at the puncture site in your groin, neck, or forearm.  Check your puncture site every day for signs of infection.  Get help right away if your puncture site is bleeding and the bleeding does not stop after applying firm pressure to the area. This is a medical emergency. This information is not intended to replace advice given to you by your health care provider. Make sure you discuss any questions you have with your health care provider.

## 2019-08-02 ENCOUNTER — Encounter (HOSPITAL_COMMUNITY): Payer: Self-pay | Admitting: Internal Medicine

## 2019-08-02 NOTE — Anesthesia Postprocedure Evaluation (Signed)
Anesthesia Post Note  Patient: [name]  Procedure(s) Performed: ATRIAL FIBRILLATION ABLATION (N/A )     Patient location during evaluation: PACU Anesthesia Type: General Level of consciousness: awake and alert Pain management: pain level controlled Vital Signs Assessment: post-procedure vital signs reviewed and stable Respiratory status: spontaneous breathing, nonlabored ventilation, respiratory function stable and patient connected to nasal cannula oxygen Cardiovascular status: blood pressure returned to baseline and stable Postop Assessment: no apparent nausea or vomiting Anesthetic complications: no    Last Vitals:  Vitals:   08/01/19 1715 08/01/19 1745  BP: 139/76 (!) 151/80  Pulse: 86 89  Resp: 15 13  Temp:    SpO2: 96% 97%    Last Pain:  Vitals:   08/02/19 1153  TempSrc:   PainSc: 0-No pain                 Effie Berkshire

## 2019-08-14 ENCOUNTER — Encounter (HOSPITAL_COMMUNITY): Payer: Self-pay | Admitting: Internal Medicine

## 2019-08-29 ENCOUNTER — Other Ambulatory Visit: Payer: Self-pay

## 2019-08-29 ENCOUNTER — Encounter (HOSPITAL_COMMUNITY): Payer: Self-pay | Admitting: Nurse Practitioner

## 2019-08-29 ENCOUNTER — Ambulatory Visit (HOSPITAL_COMMUNITY)
Admission: RE | Admit: 2019-08-29 | Discharge: 2019-08-29 | Disposition: A | Discharge status: Home / Self Care | Payer: Medicare Other | Source: Ambulatory Visit | Attending: Nurse Practitioner | Admitting: Nurse Practitioner

## 2019-08-29 VITALS — BP 136/64 | HR 95 | Ht 73.0 in | Wt 202.2 lb

## 2019-08-29 DIAGNOSIS — Z8249 Family history of ischemic heart disease and other diseases of the circulatory system: Secondary | ICD-10-CM | POA: Diagnosis not present

## 2019-08-29 DIAGNOSIS — I252 Old myocardial infarction: Secondary | ICD-10-CM | POA: Insufficient documentation

## 2019-08-29 DIAGNOSIS — Z87891 Personal history of nicotine dependence: Secondary | ICD-10-CM | POA: Insufficient documentation

## 2019-08-29 DIAGNOSIS — Z7901 Long term (current) use of anticoagulants: Secondary | ICD-10-CM | POA: Insufficient documentation

## 2019-08-29 DIAGNOSIS — I10 Essential (primary) hypertension: Secondary | ICD-10-CM | POA: Insufficient documentation

## 2019-08-29 DIAGNOSIS — Z7982 Long term (current) use of aspirin: Secondary | ICD-10-CM | POA: Insufficient documentation

## 2019-08-29 DIAGNOSIS — G4733 Obstructive sleep apnea (adult) (pediatric): Secondary | ICD-10-CM | POA: Diagnosis not present

## 2019-08-29 DIAGNOSIS — I480 Paroxysmal atrial fibrillation: Secondary | ICD-10-CM | POA: Insufficient documentation

## 2019-08-29 DIAGNOSIS — E785 Hyperlipidemia, unspecified: Secondary | ICD-10-CM | POA: Diagnosis not present

## 2019-08-29 DIAGNOSIS — Z79899 Other long term (current) drug therapy: Secondary | ICD-10-CM | POA: Insufficient documentation

## 2019-08-29 DIAGNOSIS — I2510 Atherosclerotic heart disease of native coronary artery without angina pectoris: Secondary | ICD-10-CM | POA: Diagnosis not present

## 2019-08-29 DIAGNOSIS — K219 Gastro-esophageal reflux disease without esophagitis: Secondary | ICD-10-CM | POA: Insufficient documentation

## 2019-08-29 NOTE — Progress Notes (Signed)
Primary Care Physician: [name], [place] Referring Physician: Dr. Rayann Heman, [facility] f/u   [name] is a 68 y.o. male with a h/o CAD s/p PCI,hypertension, hyperlipidemia, OSA,carotid stenosis s/p CEA,and GERD here with fatigue and shortness of breath and found to have new onset atrial fibrillation with rapid ventricular response. He was seen for preoperative clearance prior to EGD/colonoscopy and was noted to be bradycardic to the 40s 04/2018. At the time he was on atenolol 25mg  which was discontinued. On 48 hour Holter he was noted to be bradcyardic to the 40s overnight as well as 3 second runs of SVT. He has known OSA and is compliant with CPAP. Since that time he continued to be very fatigued. He is typically active and has been on FMLA from work because he cannot walk short distances without palpitations and shortness of breath. He also noted intermittent L sided chest discomfort with radiation to the L shoulder. He checks his HR and BP at home and his monitor sometimes reports and irregular heart rhythm. Repeat Lexiscan Myoview7/19/19 was negative for ischemia. LVEF was 56%. He does not smoke. He drinks ETOH 2-3 beers when fishing, otherwise limited EtOH. One coffee/day.  It was felt likely intermittent afib for the last 5 weeks and then persistent over the last few days. He converted to SR with 1 dose of IV metoprolol. He has had bradycardia in the past with scheduled Atenolol. Options discussed with  pt including prn BB therapy vs AAD therapy (Tikosyn only real option). He would like to avoid AADs which is reasonable. He was sent home on Metoprolol 25mg  prn for palpitations.  In the afib clinic, he feels well. No further afib. No bleeding issues with Eliquis 5 mg bid. He has metoprolol to use as needed, he has not had to use.   F/u in afib clinic, 10/2. He feels well. No further afib. No issues with bleeding anticoagulation. Reports a random  burring left chest that  goes down left arm that will last 15 mins. Is not tied to activity and had a low risk stress test in July. Has cut back on some alcohol.  F/u in afib clinic. 07/03/19, f/u per Dr. Rayann Heman  Pt was hospitalized late August thru early September for  bowel obstruction and Covid positive.He did not have any respiratory symptoms. He was sent to the hospital and was seen by Dr. Rayann Heman who recommended Multaq and cardioversion. H  had successful cardioversion was did not take but one pill of Multaq as it made him extremely weak. He did not take any further doses. He is in SR today. Dr. Rayann Heman did mention in his note that he would have a low threshold for ablation. Pt would like to pursue.   F/u in afib clinic, 08/29/19,  for afib  ablation one month f/u. He is in SR. He feels well. Just a few papitations but nothing sustained. No swallowing or groin issues. Contniues with anticoagulation.  Today, he denies symptoms of palpitations, chest pain, shortness of breath, orthopnea, PND, lower extremity edema, dizziness, presyncope, syncope, or neurologic sequela. The patient is tolerating medications without difficulties and is otherwise without complaint today.   Past Medical History:  Diagnosis Date   Atrial fibrillation ([place])    Bradycardia    Carotid artery disease ([place])    Right CEA;  dopplers 6/12:  0-39% bilat ICA   Coronary artery disease    s/p BMS to RCA 2002; [place] 2008: oLAD 40%, pRCA stent ok with  20%, EF 60%); Myoview scan in March 2011 which was negative for ischemia    GERD (gastroesophageal reflux disease)    Hyperlipidemia    Hypertension    Myocardial infarction ([place])    OSA on CPAP    Typical atrial flutter ([place])    Past Surgical History:  Procedure Laterality Date   ATRIAL FIBRILLATION ABLATION N/A 08/01/2019   Procedure: ATRIAL FIBRILLATION ABLATION;  Surgeon: Thompson Grayer, MD;  Location: [facility];  Service: Cardiovascular;  Laterality: N/A;   CARDIAC  CATHETERIZATION  06/01/2007    EF of 65% -- Nonobstructive CAD with 40% ostial stenosis in the LAD, no significant obstruction in the circumflex artery, 20% narrowing within the stent in the proximal to mid right coronary and normal LV function   CARDIAC CATHETERIZATION  02/02/2006   EF of 50%   CARDIAC CATHETERIZATION  01/19/2002   EF of 60%   CARDIAC CATHETERIZATION N/A 12/03/2015   Procedure: Left Heart Cath and Coronary Angiography;  Surgeon: Burnell Blanks, MD;  Location: [facility];  Service: Cardiovascular;  Laterality: N/A;   CAROTID ENDARTERECTOMY  06/26/2002   right   CORONARY ANGIOPLASTY WITH STENT PLACEMENT  03/08/2000   Bare-metal stent in RCA, in [place] ARTHROSCOPY     right   MOLE REMOVAL     Prior moles removed    Current Outpatient Medications  Medication Sig Dispense Refill   amLODipine (NORVASC) 10 MG tablet Take 1 tablet (10 mg total) by mouth daily. 90 tablet 3   apixaban (ELIQUIS) 5 MG TABS tablet Take 1 tablet (5 mg total) by mouth 2 (two) times daily. 180 tablet 2   aspirin 81 MG tablet Take 81 mg by mouth at bedtime.      b complex vitamins capsule Take 1 capsule by mouth daily.     doxazosin (CARDURA) 2 MG tablet Take 2 mg by mouth daily.     ezetimibe (ZETIA) 10 MG tablet Take 1 tablet (10 mg total) by mouth daily. 90 tablet 3   finasteride (PROSCAR) 5 MG tablet Take 1 tablet (5 mg total) by mouth daily. 90 tablet 2   gabapentin (NEURONTIN) 300 MG capsule Take 600 mg by mouth at bedtime.     hydrochlorothiazide (HYDRODIURIL) 25 MG tablet Take 25 mg by mouth daily.     isosorbide mononitrate (IMDUR) 30 MG 24 hr tablet Take 30 mg by mouth daily.     metoprolol tartrate (LOPRESSOR) 25 MG tablet Take 1 tablet (25 mg total) by mouth daily as needed (as needed for palpitations/afib.). 60 tablet 3   nitroGLYCERIN (NITROSTAT) 0.4 MG SL tablet Place 1 tablet (0.4 mg total) under the tongue every 5 (five) minutes as needed.  Call 9-1-1 if need more than 2. 25 tablet 0   ondansetron (ZOFRAN ODT) 8 MG disintegrating tablet Take 1 tablet (8 mg total) by mouth every 8 (eight) hours as needed for nausea or vomiting. 20 tablet 0   pantoprazole (PROTONIX) 40 MG tablet Take 40 mg by mouth daily.     polyethylene glycol (MIRALAX / GLYCOLAX) 17 g packet Take 17 g by mouth daily as needed for mild constipation. 30 each 0   potassium chloride (KLOR-CON) 10 MEQ tablet Take 10 mEq by mouth daily.     ramipril (ALTACE) 10 MG capsule Take 1 capsule (10 mg total) by mouth 2 (two) times daily. 180 capsule 1   rosuvastatin (CRESTOR) 40 MG tablet Take 40 mg by mouth daily.  tiZANidine (ZANAFLEX) 2 MG tablet Take one tablet at bedtime as needed for pain, if tolerating, ok to take twice daily as needed 45 tablet 3   No current facility-administered medications for this encounter.     Allergies  Allergen Reactions   Felodipine Swelling    Legs became swollen   Niacin Other (See Comments)    Made patient feel flushed    Social History   Socioeconomic History   Marital status: Married    Spouse name: Not on file   Number of children: 3   Years of education: 12   Highest education level: High school graduate  Occupational History   Occupation: Programme researcher, broadcasting/film/video strain: Not on file   Food insecurity    Worry: Not on file    Inability: Not on file   Transportation needs    Medical: Not on file    Non-medical: Not on file  Tobacco Use   Smoking status: Former Smoker    Packs/day: 1.00    Years: 30.00    Pack years: 30.00    Types: Cigarettes    Quit date: 10/19/2000    Years since quitting: 18.8   Smokeless tobacco: Current User    Types: Snuff   Tobacco comment: Dips snuff.   Substance and Sexual Activity   Alcohol use: Yes    Alcohol/week: 1.0 standard drinks    Types: 1 Cans of beer per week    Comment: Occasional beer   Drug use: No   Sexual activity:  Yes  Lifestyle   Physical activity    Days per week: Not on file    Minutes per session: Not on file   Stress: Not on file  Relationships   Social connections    Talks on phone: Not on file    Gets together: Not on file    Attends religious service: Not on file    Active member of club or organization: Not on file    Attends meetings of clubs or organizations: Not on file    Relationship status: Not on file   Intimate partner violence    Fear of current or ex partner: Not on file    Emotionally abused: Not on file    Physically abused: Not on file    Forced sexual activity: Not on file  Other Topics Concern   Not on file  Social History Narrative   Lives with wife in a one story home.  Has 3 children.  Works in Architect.  Education: high school. Patient is right handed.    Family History  Problem Relation Age of Onset   Heart failure Father    Heart disease Father    Peripheral vascular disease Mother        with pacemaker, and had a carotid endarterectomy   Heart disease Mother    Hypertension Brother    Hypertension Brother     ROS- All systems are reviewed and negative except as per the HPI above  Physical Exam: Vitals:   08/29/19 1308  BP: 136/64  Pulse: 95  Weight: 91.7 kg  Height: 6\' 1"  (1.854 m)   Wt Readings from Last 3 Encounters:  08/29/19 91.7 kg  08/01/19 92.1 kg  07/19/19 89.8 kg    Labs: Lab Results  Component Value Date   NA 143 07/28/2019   K 4.4 07/28/2019   CL 106 07/28/2019   CO2 25 07/28/2019   GLUCOSE 101 (H) 07/28/2019   BUN  15 07/28/2019   CREATININE 1.01 07/28/2019   CALCIUM 9.3 07/28/2019   PHOS 4.5 06/22/2019   MG 1.9 06/29/2019   Lab Results  Component Value Date   INR 1.01 05/30/2018   Lab Results  Component Value Date   CHOL 114 05/31/2018   HDL 30 (L) 05/31/2018   LDLCALC 62 05/31/2018   TRIG 162 (H) 06/19/2019     GEN- The patient is well appearing, alert and oriented x 3 today.   Head-  normocephalic, atraumatic Eyes-  Sclera clear, conjunctiva pink Ears- hearing intact Oropharynx- clear Neck- supple, no JVP Lymph- no cervical lymphadenopathy Lungs- Clear to ausculation bilaterally, normal work of breathing Heart- Regular rate and rhythm, no murmurs, rubs or gallops, PMI not laterally displaced GI- soft, NT, ND, + BS Extremities- no clubbing, cyanosis, or edema MS- no significant deformity or atrophy Skin- no rash or lesion Psych- euthymic mood, full affect Neuro- strength and sensation are intact  EKG-NSR at 90  bpm, pr int 154 ms, qrs int 90 ms, qtc 420 ms  Epic records reviewed Echo-Study Conclusions  - Left ventricle: The cavity size was normal. There was mild   concentric hypertrophy. Systolic function was normal. The   estimated ejection fraction was in the range of 60% to 65%. Wall   motion was normal; there were no regional wall motion   abnormalities. Features are consistent with a pseudonormal left   ventricular filling pattern, with concomitant abnormal relaxation   and increased filling pressure (grade 2 diastolic dysfunction). - Aortic valve: Trileaflet; mildly thickened, mildly calcified   leaflets. - Left atrium: The atrium was mildly dilated. - Tricuspid valve: There was trivial regurgitation. - Pulmonic valve: There was mild regurgitation.    Assessment and Plan: 1. Paroxysmal afib S/p ablation 08/01/19 Tolerated very well Maintaining SR  Continue to use CPAP  He has metoprolol to use prn as he has brady on scheduled BB  2. Chadavasc score of at least 3 Continue eliquis 5 mg bid without interruption in the 3 month healing period   F/u with Dr. Rayann Heman 11/07/18   Geroge Baseman. Kong Packett, [facility] [address] [place],  [address] [phone]

## 2019-09-25 ENCOUNTER — Other Ambulatory Visit: Payer: Self-pay | Admitting: *Deleted

## 2019-11-08 ENCOUNTER — Telehealth (INDEPENDENT_AMBULATORY_CARE_PROVIDER_SITE_OTHER): Payer: Medicare Other | Admitting: Internal Medicine

## 2019-11-08 ENCOUNTER — Encounter: Payer: Self-pay | Admitting: Internal Medicine

## 2019-11-08 VITALS — BP 138/90 | HR 72 | Ht 73.0 in | Wt 210.0 lb

## 2019-11-08 DIAGNOSIS — I483 Typical atrial flutter: Secondary | ICD-10-CM | POA: Diagnosis not present

## 2019-11-08 DIAGNOSIS — I480 Paroxysmal atrial fibrillation: Secondary | ICD-10-CM

## 2019-11-08 DIAGNOSIS — I10 Essential (primary) hypertension: Secondary | ICD-10-CM | POA: Diagnosis not present

## 2019-11-08 DIAGNOSIS — G4733 Obstructive sleep apnea (adult) (pediatric): Secondary | ICD-10-CM | POA: Diagnosis not present

## 2019-11-08 DIAGNOSIS — D6869 Other thrombophilia: Secondary | ICD-10-CM

## 2019-11-08 NOTE — Progress Notes (Signed)
Electrophysiology TeleHealth Note   Due to national recommendations of social distancing due to COVID 19, an audio/video telehealth visit is felt to be most appropriate for this patient at this time.  See MyChart message from today for the patient's consent to telehealth for [facility].  Date:  11/08/2019   ID:  [name], DOB [birth year], MRN [id number]  Location: patient's home  Provider location:  [facility]  Evaluation Performed: Follow-up visit  PCP:  [name], FNP   Electrophysiologist:  Dr Rayann Heman  Chief Complaint:  palpitations  History of Present Illness:    [name] is a 69 y.o. male who presents via telehealth conferencing today.  Since his afib ablation, the patient reports doing very well. Pleased with results.  Denies procedure related complications.  Today, he denies symptoms of palpitations, chest pain, shortness of breath,  lower extremity edema, dizziness, presyncope, or syncope.  The patient is otherwise without complaint today.  The patient denies symptoms of fevers, chills, cough, or new SOB worrisome for COVID 19.  Past Medical History:  Diagnosis Date   Acute left ankle pain 08/05/2018   AKI (acute kidney injury) ([place]) 06/05/2019   Atrial fibrillation ([facility])    Atrial fibrillation with RVR ([place]) 06/29/2019   Bradycardia    Bradycardia 04/26/2018   Carotid artery disease ([facility])    Right CEA;  dopplers 6/12:  0-39% bilat ICA   Coronary artery disease    s/p BMS to RCA 2002; [place] 2008: oLAD 40%, pRCA stent ok with 20%, EF 60%); Myoview scan in March 2011 which was negative for ischemia    Coronary atherosclerosis 12/25/2008   Qualifier: Diagnosis of  By: Fuller Plan MD Lamont Snowball T    COVID-19 virus infection 06/05/2019   Dizziness 04/26/2018   Essential hypertension 10/08/2007   Qualifier: Diagnosis of  By: Floyd Hill, [place]     GASTROESOPHAGEAL REFLUX DISEASE 10/08/2007   Qualifier: Diagnosis of  By: Danny Lawless CMA, [place]      GERD (gastroesophageal reflux disease)    Hyperlipidemia    Hypersomnia with sleep apnea 12/05/2015   Hypertension    MYOCARDIAL INFARCTION 02/03/2008   Qualifier: History of  By: Doy Mince LPN, [place]     Myocardial infarction [facility])    Obstructive sleep apnea 02/03/2008   NPSG; 02/04/2008-mild OSA-RDI 5.1 per hour CPAP 11 cm H2O.        OSA on CPAP    Plantar fasciitis, bilateral 12/13/2012   Injection Right heel Feb '14    Preoperative clearance 04/26/2018   Syncope 06/05/2019   Typical atrial flutter ([place])    Unstable angina ([place]) 12/02/2015    Past Surgical History:  Procedure Laterality Date   ATRIAL FIBRILLATION ABLATION N/A 08/01/2019   Procedure: ATRIAL FIBRILLATION ABLATION;  Surgeon: Thompson Grayer, MD;  Location: [facility];  Service: Cardiovascular;  Laterality: N/A;   CARDIAC CATHETERIZATION  06/01/2007    EF of 65% -- Nonobstructive CAD with 40% ostial stenosis in the LAD, no significant obstruction in the circumflex artery, 20% narrowing within the stent in the proximal to mid right coronary and normal LV function   CARDIAC CATHETERIZATION  02/02/2006   EF of 50%   CARDIAC CATHETERIZATION  01/19/2002   EF of 60%   CARDIAC CATHETERIZATION N/A 12/03/2015   Procedure: Left Heart Cath and Coronary Angiography;  Surgeon: Burnell Blanks, MD;  Location: [facility];  Service: Cardiovascular;  Laterality: N/A;   CAROTID ENDARTERECTOMY  06/26/2002  right   CORONARY ANGIOPLASTY WITH STENT PLACEMENT  03/08/2000   Bare-metal stent in RCA, in [place] ARTHROSCOPY     right   MOLE REMOVAL     Prior moles removed    Current Outpatient Medications  Medication Sig Dispense Refill   amLODipine (NORVASC) 10 MG tablet Take 1 tablet (10 mg total) by mouth daily. 90 tablet 3   apixaban (ELIQUIS) 5 MG TABS tablet Take 1 tablet (5 mg total) by mouth 2 (two) times daily. 180 tablet 2   aspirin 81 MG tablet Take 81 mg by mouth at bedtime.       b complex vitamins capsule Take 1 capsule by mouth daily.     doxazosin (CARDURA) 2 MG tablet Take 2 mg by mouth daily.     ezetimibe (ZETIA) 10 MG tablet Take 1 tablet (10 mg total) by mouth daily. 90 tablet 3   finasteride (PROSCAR) 5 MG tablet Take 1 tablet (5 mg total) by mouth daily. 90 tablet 2   gabapentin (NEURONTIN) 300 MG capsule Take 600 mg by mouth at bedtime.     hydrochlorothiazide (HYDRODIURIL) 25 MG tablet Take 25 mg by mouth daily.     isosorbide mononitrate (IMDUR) 30 MG 24 hr tablet Take 30 mg by mouth daily.     metoprolol tartrate (LOPRESSOR) 25 MG tablet Take 1 tablet (25 mg total) by mouth daily as needed (as needed for palpitations/afib.). 60 tablet 3   nitroGLYCERIN (NITROSTAT) 0.4 MG SL tablet Place 1 tablet (0.4 mg total) under the tongue every 5 (five) minutes as needed. Call 9-1-1 if need more than 2. 25 tablet 0   ondansetron (ZOFRAN ODT) 8 MG disintegrating tablet Take 1 tablet (8 mg total) by mouth every 8 (eight) hours as needed for nausea or vomiting. 20 tablet 0   pantoprazole (PROTONIX) 40 MG tablet Take 40 mg by mouth daily.     polyethylene glycol (MIRALAX / GLYCOLAX) 17 g packet Take 17 g by mouth daily as needed for mild constipation. 30 each 0   potassium chloride (KLOR-CON) 10 MEQ tablet Take 10 mEq by mouth daily.     ramipril (ALTACE) 10 MG capsule Take 1 capsule (10 mg total) by mouth 2 (two) times daily. 180 capsule 1   rosuvastatin (CRESTOR) 40 MG tablet Take 40 mg by mouth daily.     tiZANidine (ZANAFLEX) 2 MG tablet Take one tablet at bedtime as needed for pain, if tolerating, ok to take twice daily as needed 45 tablet 3   No current facility-administered medications for this visit.    Allergies:   Felodipine and Niacin   Social History:  The patient  reports that he quit smoking about 19 years ago. His smoking use included cigarettes. He has a 30.00 pack-year smoking history. His smokeless tobacco use includes snuff. He  reports current alcohol use of about 1.0 standard drinks of alcohol per week. He reports that he does not use drugs.   Family History:  The patient's  family history includes Heart disease in his father and mother; Heart failure in his father; Hypertension in his brother and brother; Peripheral vascular disease in his mother.   ROS:  Please see the history of present illness.   All other systems are personally reviewed and negative.    Exam:    Vital Signs:  BP 138/90    Pulse 72    Ht 6\' 1"  (1.854 m)    Wt 210 lb (95.3 kg)  BMI 27.71 kg/m   Well sounding and appearing, alert and conversant, regular work of breathing,  good skin color Eyes- anicteric, neuro- grossly intact, skin- no apparent rash or lesions or cyanosis, mouth- oral mucosa is pink  Labs/Other Tests and Data Reviewed:    Recent Labs: 06/16/2019: B Natriuretic Peptide 74.1 06/29/2019: Magnesium 1.9; TSH 1.913 07/03/2019: ALT 79 07/28/2019: BUN 15; Creatinine, Ser 1.01; Hemoglobin 11.0; Platelets 173; Potassium 4.4; Sodium 143   Wt Readings from Last 3 Encounters:  11/08/19 210 lb (95.3 kg)  08/29/19 202 lb 3.2 oz (91.7 kg)  08/01/19 203 lb (92.1 kg)      ASSESSMENT & PLAN:    1.  Paroxysmal atrial fibrillation/ atrial flutter Doing well s/p ablation Maintaining sinus and pleased with results. chads2vasc score is 3.  Continue eliquis  2. OSA Uses CPAP  3. CAD No ischemic symptoms  4. HTN Stable No change required today   Follow-up:  3 months with me   Patient Risk:  after full review of this patients clinical status, I feel that they are at moderate risk at this time.  Today, I have spent 15 minutes with the patient with telehealth technology discussing arrhythmia management .    Army Fossa, MD  11/08/2019 2:00 PM     [address] [place] [address] [phone] (office) [phone] (fax)

## 2019-11-10 ENCOUNTER — Encounter: Payer: Self-pay | Admitting: Family

## 2019-11-20 ENCOUNTER — Other Ambulatory Visit: Payer: Self-pay | Admitting: Family

## 2019-11-20 ENCOUNTER — Encounter: Payer: Self-pay | Admitting: Family

## 2019-11-20 MED ORDER — TIZANIDINE HCL 2 MG PO TABS: ORAL_TABLET | ORAL | 3 refills | Status: DC

## 2019-12-20 ENCOUNTER — Encounter: Payer: Self-pay | Admitting: Family

## 2020-02-05 ENCOUNTER — Other Ambulatory Visit: Payer: Self-pay | Admitting: Family

## 2020-02-12 ENCOUNTER — Telehealth (INDEPENDENT_AMBULATORY_CARE_PROVIDER_SITE_OTHER): Payer: Medicare Other | Admitting: Internal Medicine

## 2020-02-12 ENCOUNTER — Other Ambulatory Visit: Payer: Self-pay

## 2020-02-12 DIAGNOSIS — E785 Hyperlipidemia, unspecified: Secondary | ICD-10-CM

## 2020-02-12 DIAGNOSIS — D6869 Other thrombophilia: Secondary | ICD-10-CM

## 2020-02-12 DIAGNOSIS — I2510 Atherosclerotic heart disease of native coronary artery without angina pectoris: Secondary | ICD-10-CM

## 2020-02-12 DIAGNOSIS — I483 Typical atrial flutter: Secondary | ICD-10-CM | POA: Diagnosis not present

## 2020-02-12 DIAGNOSIS — G4733 Obstructive sleep apnea (adult) (pediatric): Secondary | ICD-10-CM | POA: Diagnosis not present

## 2020-02-12 DIAGNOSIS — I480 Paroxysmal atrial fibrillation: Secondary | ICD-10-CM

## 2020-02-12 DIAGNOSIS — I10 Essential (primary) hypertension: Secondary | ICD-10-CM

## 2020-02-12 NOTE — Patient Instructions (Signed)
Medication Instructions:  Your physician recommends that you continue on your current medications as directed. Please refer to the Current Medication list given to you today.  *If you need a refill on your cardiac medications before your next appointment, please call your pharmacy*   Lab Work: None ordered.  If you have labs (blood work) drawn today and your tests are completely normal, you will receive your results only by: Marland Kitchen MyChart Message (if you have MyChart) OR . A paper copy in the mail If you have any lab test that is abnormal or we need to change your treatment, we will call you to review the results.   Testing/Procedures: None ordered.    Follow-Up: At [facility], you and your health needs are our priority.  As part of our continuing mission to provide you with exceptional heart care, we have created designated Provider Care Teams.  These Care Teams include your primary Cardiologist (physician) and Advanced Practice Providers (APPs -  Physician Assistants and Nurse Practitioners) who all work together to provide you with the care you need, when you need it.  We recommend signing up for the patient portal called "MyChart".  Sign up information is provided on this After Visit Summary.  MyChart is used to connect with patients for Virtual Visits (Telemedicine).  Patients are able to view lab/test results, encounter notes, upcoming appointments, etc.  Non-urgent messages can be sent to your provider as well.   To learn more about what you can do with MyChart, go to [email or web address].    Your next appointment:   6 months with Dr Rayann Heman.  You will receive a reminder letter to schedule.

## 2020-02-12 NOTE — Progress Notes (Signed)
Electrophysiology TeleHealth Note   Due to national recommendations of social distancing due to COVID 19, an audio/video telehealth visit is felt to be most appropriate for this patient at this time.  See MyChart message from today for the patient's consent to telehealth for [facility].  Date:  02/12/2020   ID:  [name], DOB 09/04/1951, MRN [id number]  Location: patient's home  Provider location:  [place]  Evaluation Performed: Follow-up visit  PCP:  [name], FNP   Electrophysiologist:  Dr Rayann Heman  Chief Complaint:  palpitations  History of Present Illness:    [name] is a 69 y.o. male who presents via telehealth conferencing today.  Since last being seen in our clinic, the patient reports doing very well.  afib is "doing great".  No episodes post ablation.  Today, he denies symptoms of palpitations, chest pain, shortness of breath,  lower extremity edema, dizziness, presyncope, or syncope.  The patient is otherwise without complaint today.   Past Medical History:  Diagnosis Date  . Acute left ankle pain 08/05/2018  . AKI (acute kidney injury) ([place]) 06/05/2019  . Atrial fibrillation ([place])   . Atrial fibrillation with RVR ([place]) 06/29/2019  . Bradycardia   . Bradycardia 04/26/2018  . Carotid artery disease ([place])    Right CEA;  dopplers 6/12:  0-39% bilat ICA  . Coronary artery disease    s/p BMS to RCA 2002; [place] 2008: oLAD 40%, pRCA stent ok with 20%, EF 60%); Myoview scan in March 2011 which was negative for ischemia   . Coronary atherosclerosis 12/25/2008   Qualifier: Diagnosis of  By: Fuller Plan MD Lamont Snowball T   . COVID-19 virus infection 06/05/2019  . Dizziness 04/26/2018  . Essential hypertension 10/08/2007   Qualifier: Diagnosis of  By: Danny Lawless CMA, [place]    . GASTROESOPHAGEAL REFLUX DISEASE 10/08/2007   Qualifier: Diagnosis of  By: [place], [place]    . GERD (gastroesophageal reflux disease)   . Hyperlipidemia   . Hypersomnia with sleep  apnea 12/05/2015  . Hypertension   . MYOCARDIAL INFARCTION 02/03/2008   Qualifier: History of  By: Doy Mince LPN, Megan    . Myocardial infarction ([place])   . Obstructive sleep apnea 02/03/2008   NPSG; 02/04/2008-mild OSA-RDI 5.1 per hour CPAP 11 cm H2O.       . OSA on CPAP   . Plantar fasciitis, bilateral 12/13/2012   Injection Right heel Feb '14   . Preoperative clearance 04/26/2018  . Syncope 06/05/2019  . Typical atrial flutter ([place])   . Unstable angina ([place]) 12/02/2015    Past Surgical History:  Procedure Laterality Date  . ATRIAL FIBRILLATION ABLATION N/A 08/01/2019   Procedure: ATRIAL FIBRILLATION ABLATION;  Surgeon: Thompson Grayer, MD;  Location: [facility];  Service: Cardiovascular;  Laterality: N/A;  . CARDIAC CATHETERIZATION  06/01/2007    EF of 65% -- Nonobstructive CAD with 40% ostial stenosis in the LAD, no significant obstruction in the circumflex artery, 20% narrowing within the stent in the proximal to mid right coronary and normal LV function  . CARDIAC CATHETERIZATION  02/02/2006   EF of 50%  . CARDIAC CATHETERIZATION  01/19/2002   EF of 60%  . CARDIAC CATHETERIZATION N/A 12/03/2015   Procedure: Left Heart Cath and Coronary Angiography;  Surgeon: Burnell Blanks, MD;  Location: [facility];  Service: Cardiovascular;  Laterality: N/A;  . CAROTID ENDARTERECTOMY  06/26/2002   right  . CORONARY ANGIOPLASTY WITH STENT PLACEMENT  03/08/2000   Bare-metal stent in RCA, in [place]  . KNEE ARTHROSCOPY     right  . MOLE REMOVAL     Prior moles removed    Current Outpatient Medications  Medication Sig Dispense Refill  . amLODipine (NORVASC) 10 MG tablet Take 1 tablet (10 mg total) by mouth daily. 90 tablet 3  . apixaban (ELIQUIS) 5 MG TABS tablet Take 1 tablet (5 mg total) by mouth 2 (two) times daily. 180 tablet 2  . b complex vitamins capsule Take 1 capsule by mouth daily.    Marland Kitchen doxazosin (CARDURA) 2 MG tablet Take 2 mg by mouth daily.    Marland Kitchen ezetimibe  (ZETIA) 10 MG tablet Take 1 tablet (10 mg total) by mouth daily. 90 tablet 3  . finasteride (PROSCAR) 5 MG tablet Take 1 tablet (5 mg total) by mouth daily. 90 tablet 2  . gabapentin (NEURONTIN) 300 MG capsule Take 600 mg by mouth at bedtime.    . hydrochlorothiazide (HYDRODIURIL) 25 MG tablet Take 25 mg by mouth daily.    . isosorbide mononitrate (IMDUR) 30 MG 24 hr tablet Take 30 mg by mouth daily.    . metoprolol tartrate (LOPRESSOR) 25 MG tablet Take 1 tablet (25 mg total) by mouth daily as needed (as needed for palpitations/afib.). 60 tablet 3  . nitroGLYCERIN (NITROSTAT) 0.4 MG SL tablet Place 1 tablet (0.4 mg total) under the tongue every 5 (five) minutes as needed. Call 9-1-1 if need more than 2. 25 tablet 0  . ondansetron (ZOFRAN ODT) 8 MG disintegrating tablet Take 1 tablet (8 mg total) by mouth every 8 (eight) hours as needed for nausea or vomiting. 20 tablet 0  . pantoprazole (PROTONIX) 40 MG tablet Take 40 mg by mouth daily.    . polyethylene glycol (MIRALAX / GLYCOLAX) 17 g packet Take 17 g by mouth daily as needed for mild constipation. 30 each 0  . potassium chloride (KLOR-CON) 10 MEQ tablet TAKE 1 TABLET BY MOUTH DAILY 90 tablet 1  . ramipril (ALTACE) 10 MG capsule Take 1 capsule (10 mg total) by mouth 2 (two) times daily. 180 capsule 1  . rosuvastatin (CRESTOR) 40 MG tablet Take 40 mg by mouth daily.    Marland Kitchen tiZANidine (ZANAFLEX) 2 MG tablet Take one tablet at bedtime as needed for pain, if tolerating, ok to take twice daily as needed 90 tablet 3   No current facility-administered medications for this visit.    Allergies:   Felodipine and Niacin   Social History:  The patient  reports that he quit smoking about 19 years ago. His smoking use included cigarettes. He has a 30.00 pack-year smoking history. His smokeless tobacco use includes snuff. He reports current alcohol use of about 1.0 standard drinks of alcohol per week. He reports that he does not use drugs.   ROS:  Please see  the history of present illness.   All other systems are personally reviewed and negative.    Exam:    Vital Signs:  There were no vitals taken for this visit.  Well sounding and appearing, alert and conversant, regular work of breathing,  good skin color Eyes- anicteric, neuro- grossly intact, skin- no apparent rash or lesions or cyanosis, mouth- oral mucosa is pink  Labs/Other Tests and Data Reviewed:    Recent Labs: 06/16/2019: B Natriuretic Peptide 74.1 06/29/2019: Magnesium 1.9; TSH 1.913 07/03/2019: ALT 79 07/28/2019: BUN 15; Creatinine, Ser 1.01; Hemoglobin 11.0; Platelets 173; Potassium 4.4; Sodium 143   Wt Readings  from Last 3 Encounters:  11/08/19 210 lb (95.3 kg)  08/29/19 202 lb 3.2 oz (91.7 kg)  08/01/19 203 lb (92.1 kg)       ASSESSMENT & PLAN:    1.  Paroxysmal atrial fibrillation/ atrial flutter Doing very well post ablation off AAD therapy chads2vasc score is 3.  He is on eliquis  2. CAD No ischemic symptoms He has some claudication symptoms.  He would benefit from follow-up with Dr Angelena Form  3. OSA Uses CPAP  4. HTN Stable No change required today  5. HL Continue crestor   Follow-up:  3 months with Dr Angelena Form I will see in 6 months   Patient Risk:  after full review of this patients clinical status, I feel that they are at moderate risk at this time.  Today, I have spent 15 minutes with the patient with telehealth technology discussing arrhythmia management .    Army Fossa, MD  02/12/2020 10:44 AM     [facility] [address] [place] [address] [phone] (office) [phone] (fax)

## 2020-02-13 DIAGNOSIS — M79606 Pain in leg, unspecified: Secondary | ICD-10-CM

## 2020-02-16 ENCOUNTER — Other Ambulatory Visit: Payer: Self-pay

## 2020-02-16 ENCOUNTER — Encounter: Payer: Self-pay | Admitting: Family

## 2020-02-16 MED ORDER — ISOSORBIDE MONONITRATE ER 30 MG PO TB24: 30.0000 mg | ORAL_TABLET | Freq: Every day | ORAL | 1 refills | Status: DC

## 2020-02-16 MED ORDER — HYDROCHLOROTHIAZIDE 25 MG PO TABS: 25.0000 mg | ORAL_TABLET | Freq: Every day | ORAL | 1 refills | Status: DC

## 2020-02-19 ENCOUNTER — Encounter: Payer: Self-pay | Admitting: Family

## 2020-02-19 ENCOUNTER — Other Ambulatory Visit: Payer: Self-pay | Admitting: Family

## 2020-02-19 MED ORDER — PANTOPRAZOLE SODIUM 40 MG PO TBEC: 40.0000 mg | DELAYED_RELEASE_TABLET | Freq: Every day | ORAL | 3 refills | Status: DC

## 2020-02-19 MED ORDER — PANTOPRAZOLE SODIUM 40 MG PO TBEC: 40.0000 mg | DELAYED_RELEASE_TABLET | Freq: Every day | ORAL | 1 refills | Status: DC

## 2020-02-20 ENCOUNTER — Other Ambulatory Visit (HOSPITAL_COMMUNITY): Payer: Self-pay | Admitting: Podiatry

## 2020-02-20 DIAGNOSIS — I739 Peripheral vascular disease, unspecified: Secondary | ICD-10-CM

## 2020-02-22 ENCOUNTER — Other Ambulatory Visit: Payer: Self-pay

## 2020-02-22 ENCOUNTER — Ambulatory Visit (HOSPITAL_COMMUNITY)
Admission: RE | Admit: 2020-02-22 | Discharge: 2020-02-22 | Disposition: A | Discharge status: Home / Self Care | Payer: Medicare Other | Source: Ambulatory Visit | Attending: Cardiology | Admitting: Cardiology

## 2020-02-22 DIAGNOSIS — I739 Peripheral vascular disease, unspecified: Secondary | ICD-10-CM | POA: Diagnosis not present

## 2020-02-22 DIAGNOSIS — M79606 Pain in leg, unspecified: Secondary | ICD-10-CM

## 2020-02-26 ENCOUNTER — Encounter: Payer: Self-pay | Admitting: Family

## 2020-02-26 ENCOUNTER — Other Ambulatory Visit: Payer: Self-pay | Admitting: Family

## 2020-02-26 DIAGNOSIS — M79604 Pain in right leg: Secondary | ICD-10-CM

## 2020-02-26 DIAGNOSIS — M79605 Pain in left leg: Secondary | ICD-10-CM

## 2020-03-04 ENCOUNTER — Ambulatory Visit: Payer: Medicare Other | Admitting: Cardiovascular Disease

## 2020-03-08 ENCOUNTER — Other Ambulatory Visit: Payer: Self-pay

## 2020-03-20 ENCOUNTER — Telehealth: Payer: Self-pay

## 2020-03-20 NOTE — Telephone Encounter (Signed)
Pt daughter called no answer waiting on call back to tell her that Dr Delice Lesch can see her Dad on June 14 at 1pm, in the meantime he can increase the gabapentin to 900mg  at bedtime. Neuropathy does not usually cause swelling, has he had any falls? Has he tried elevating the foot to help with the swelling

## 2020-03-25 ENCOUNTER — Ambulatory Visit: Payer: Medicare Other | Admitting: Adult Health

## 2020-03-25 ENCOUNTER — Telehealth: Payer: Self-pay | Admitting: Adult Health

## 2020-03-25 NOTE — Telephone Encounter (Signed)
Noted  

## 2020-03-25 NOTE — Telephone Encounter (Signed)
Pt's daughter Crystal called and LVM Friday morning stating that pt will not be able to come to his Monday appt and will be needing to r/s. I have called daughter back and LVM for her to call us back to r/s.

## 2020-03-31 ENCOUNTER — Other Ambulatory Visit: Payer: Self-pay | Admitting: Family

## 2020-04-01 ENCOUNTER — Ambulatory Visit: Payer: Medicare Other | Admitting: Neurology

## 2020-04-01 ENCOUNTER — Encounter: Payer: Self-pay | Admitting: Neurology

## 2020-04-01 ENCOUNTER — Other Ambulatory Visit: Payer: Self-pay

## 2020-04-01 VITALS — BP 131/66 | HR 80 | Ht 73.0 in | Wt 198.2 lb

## 2020-04-01 DIAGNOSIS — M25572 Pain in left ankle and joints of left foot: Secondary | ICD-10-CM

## 2020-04-01 DIAGNOSIS — M79605 Pain in left leg: Secondary | ICD-10-CM | POA: Diagnosis not present

## 2020-04-01 DIAGNOSIS — M79604 Pain in right leg: Secondary | ICD-10-CM

## 2020-04-01 MED ORDER — GABAPENTIN 300 MG PO CAPS: ORAL_CAPSULE | ORAL | 11 refills | Status: DC

## 2020-04-01 NOTE — Patient Instructions (Addendum)
1. Increase gabapentin 300mg : Take 3 capsules every night  2. May try Tylenol to see if this helps with the inflammation  3. Refer to Podiatry for left ankle pain  4. Follow-up as scheduled with Dr. Posey Pronto in August, call for any changes

## 2020-04-01 NOTE — Progress Notes (Signed)
NEUROLOGY CONSULTATION NOTE  [name] MRN: [id number] DOB: 1951-06-10  HISTORY OF PRESENT ILLNESS: This is a 69 year old man with a history of bilateral leg pain, followed by Dr. Posey Pronto in our office. He presents today for an urgent visit due to worsening pain. Notes were reviewed. He had a normal EMG/NCV of both lower extremities in 09/2018, MRI lumbar spine in 08/2018 did not show any impingement to explain leg pain. There was facet degeneration at L5-S1, worse on the left, with slight anterolisthesis. Dr. Posey Pronto noted an extensive workup for leg pain by Vascular surgery. He describes the pain as achy and stiff. His daughter had contacted our office last month regarding leg pain. She also reported leg pain with left leg swelling, doppler negative for DVT. On his last phone visit with Dr. Posey Pronto in July 2020, symptoms were very well controlled on gabapentin 300mg  in AM, 600mg  qhs. Daughter was instructed to increase gabapentin dose to 900mg  qhs. He states he stopped the morning dose a while back because it was making him feel tired, he did not increase the gabapentin and continues to take 600mg  qhs. He describes a throbbing pain in both legs, worse when walking for a long time. His left ankle is "giving me a fit," with swelling. He has been told there are bone spurs. He was given a cream to rub on his ankle. He does not take any prn pain medication. He denies any numbness/tingling/burning pain in his legs. No neck or back pain. No bowel/bladder dysfunction.    PAST MEDICAL HISTORY: Past Medical History:  Diagnosis Date  . Acute left ankle pain 08/05/2018  . AKI (acute kidney injury) ([place]) 06/05/2019  . Atrial fibrillation ([place])   . Atrial fibrillation with RVR ([place]) 06/29/2019  . Bradycardia   . Bradycardia 04/26/2018  . Carotid artery disease ([place])    Right CEA;  dopplers 6/12:  0-39% bilat ICA  . Coronary artery disease    s/p BMS to RCA 2002; [place] 2008: oLAD 40%, pRCA stent ok with 20%, EF  60%); Myoview scan in March 2011 which was negative for ischemia   . Coronary atherosclerosis 12/25/2008   Qualifier: Diagnosis of  By: Fuller Plan MD Lamont Snowball T   . COVID-19 virus infection 06/05/2019  . Dizziness 04/26/2018  . Essential hypertension 10/08/2007   Qualifier: Diagnosis of  By: Danny Lawless CMA, [place]    . GASTROESOPHAGEAL REFLUX DISEASE 10/08/2007   Qualifier: Diagnosis of  By: [place], [place]    . GERD (gastroesophageal reflux disease)   . Hyperlipidemia   . Hypersomnia with sleep apnea 12/05/2015  . Hypertension   . MYOCARDIAL INFARCTION 02/03/2008   Qualifier: History of  By: Doy Mince LPN, [place]    . Myocardial infarction ([place])   . Obstructive sleep apnea 02/03/2008   NPSG; 02/04/2008-mild OSA-RDI 5.1 per hour CPAP 11 cm H2O.       . OSA on CPAP   . Plantar fasciitis, bilateral 12/13/2012   Injection Right heel Feb '14   . Preoperative clearance 04/26/2018  . Syncope 06/05/2019  . Typical atrial flutter ([place])   . Unstable angina ([place]) 12/02/2015    PAST SURGICAL HISTORY: Past Surgical History:  Procedure Laterality Date  . ATRIAL FIBRILLATION ABLATION N/A 08/01/2019   Procedure: ATRIAL FIBRILLATION ABLATION;  Surgeon: Thompson Grayer, MD;  Location: [facility];  Service: Cardiovascular;  Laterality: N/A;  . CARDIAC CATHETERIZATION  06/01/2007    EF of 65% -- Nonobstructive CAD with 40% ostial  stenosis in the LAD, no significant obstruction in the circumflex artery, 20% narrowing within the stent in the proximal to mid right coronary and normal LV function  . CARDIAC CATHETERIZATION  02/02/2006   EF of 50%  . CARDIAC CATHETERIZATION  01/19/2002   EF of 60%  . CARDIAC CATHETERIZATION N/A 12/03/2015   Procedure: Left Heart Cath and Coronary Angiography;  Surgeon: Burnell Blanks, MD;  Location: [facility];  Service: Cardiovascular;  Laterality: N/A;  . CAROTID ENDARTERECTOMY  06/26/2002   right  . CORONARY ANGIOPLASTY WITH STENT PLACEMENT  03/08/2000    Bare-metal stent in RCA, in [place]  . KNEE ARTHROSCOPY     right  . MOLE REMOVAL     Prior moles removed    MEDICATIONS: Current Outpatient Medications on File Prior to Visit  Medication Sig Dispense Refill  . doxazosin (CARDURA) 2 MG tablet TAKE 1 TABLET BY MOUTH AT  BEDTIME 90 tablet 3  . amLODipine (NORVASC) 10 MG tablet Take 1 tablet (10 mg total) by mouth daily. 90 tablet 3  . apixaban (ELIQUIS) 5 MG TABS tablet Take 1 tablet (5 mg total) by mouth 2 (two) times daily. 180 tablet 2  . b complex vitamins capsule Take 1 capsule by mouth daily.    Marland Kitchen ezetimibe (ZETIA) 10 MG tablet Take 1 tablet (10 mg total) by mouth daily. 90 tablet 3  . finasteride (PROSCAR) 5 MG tablet Take 1 tablet (5 mg total) by mouth daily. 90 tablet 2  . gabapentin (NEURONTIN) 300 MG capsule Take 600 mg by mouth at bedtime.    . hydrochlorothiazide (HYDRODIURIL) 25 MG tablet Take 1 tablet (25 mg total) by mouth daily. 90 tablet 1  . isosorbide mononitrate (IMDUR) 30 MG 24 hr tablet Take 1 tablet (30 mg total) by mouth daily. 90 tablet 1  . metoprolol tartrate (LOPRESSOR) 25 MG tablet Take 1 tablet (25 mg total) by mouth daily as needed (as needed for palpitations/afib.). 60 tablet 3  . nitroGLYCERIN (NITROSTAT) 0.4 MG SL tablet Place 1 tablet (0.4 mg total) under the tongue every 5 (five) minutes as needed. Call 9-1-1 if need more than 2. 25 tablet 0  . ondansetron (ZOFRAN ODT) 8 MG disintegrating tablet Take 1 tablet (8 mg total) by mouth every 8 (eight) hours as needed for nausea or vomiting. 20 tablet 0  . pantoprazole (PROTONIX) 40 MG tablet Take 1 tablet (40 mg total) by mouth daily. 90 tablet 3  . polyethylene glycol (MIRALAX / GLYCOLAX) 17 g packet Take 17 g by mouth daily as needed for mild constipation. 30 each 0  . potassium chloride (KLOR-CON) 10 MEQ tablet TAKE 1 TABLET BY MOUTH DAILY 90 tablet 1  . ramipril (ALTACE) 10 MG capsule Take 1 capsule (10 mg total) by mouth 2 (two) times daily. 180 capsule  1  . rosuvastatin (CRESTOR) 40 MG tablet Take 40 mg by mouth daily.    Marland Kitchen tiZANidine (ZANAFLEX) 2 MG tablet Take one tablet at bedtime as needed for pain, if tolerating, ok to take twice daily as needed 90 tablet 3   No current facility-administered medications on file prior to visit.    ALLERGIES: Allergies  Allergen Reactions  . Felodipine Swelling    Legs became swollen  . Niacin Other (See Comments)    Made patient feel flushed    FAMILY HISTORY: Family History  Problem Relation Age of Onset  . Heart failure Father   . Heart disease Father   . Peripheral  vascular disease Mother        with pacemaker, and had a carotid endarterectomy  . Heart disease Mother   . Hypertension Brother   . Hypertension Brother     SOCIAL HISTORY: Social History   Socioeconomic History  . Marital status: Married    Spouse name: Not on file  . Number of children: 3  . Years of education: 42  . Highest education level: High school graduate  Occupational History  . Occupation: Architect  Tobacco Use  . Smoking status: Former Smoker    Packs/day: 1.00    Years: 30.00    Pack years: 30.00    Types: Cigarettes    Quit date: 10/19/2000    Years since quitting: 19.4  . Smokeless tobacco: Current User    Types: Snuff  . Tobacco comment: Dips snuff.   Vaping Use  . Vaping Use: Never used  Substance and Sexual Activity  . Alcohol use: Yes    Alcohol/week: 1.0 standard drink    Types: 1 Cans of beer per week    Comment: Occasional beer  . Drug use: No  . Sexual activity: Yes  Other Topics Concern  . Not on file  Social History Narrative   Lives with wife in a one story home.  Has 3 children.  Works in Architect.  Education: high school. Patient is right handed.   Social Determinants of Health   Financial Resource Strain:   . Difficulty of Paying Living Expenses:   Food Insecurity:   . Worried About Charity fundraiser in the Last Year:   . Arboriculturist in the Last Year:     Transportation Needs:   . Film/video editor (Medical):   Marland Kitchen Lack of Transportation (Non-Medical):   Physical Activity:   . Days of Exercise per Week:   . Minutes of Exercise per Session:   Stress:   . Feeling of Stress :   Social Connections:   . Frequency of Communication with Friends and Family:   . Frequency of Social Gatherings with Friends and Family:   . Attends Religious Services:   . Active Member of Clubs or Organizations:   . Attends Archivist Meetings:   Marland Kitchen Marital Status:   Intimate Partner Violence:   . Fear of Current or Ex-Partner:   . Emotionally Abused:   Marland Kitchen Physically Abused:   . Sexually Abused:     PHYSICAL EXAM: Vitals:   04/01/20 1253  BP: 131/66  Pulse: 80  SpO2: 96%   General: No acute distress Head:  Normocephalic/atraumatic Skin/Extremities: No rash, left ankle swelling, warm to touch Neurological Exam: Mental status: alert and oriented to person, place, and time, no dysarthria or aphasia, Fund of knowledge is appropriate.  Recent and remote memory are intact.  Attention and concentration are normal. Cranial nerves: CN I: not tested CN II: pupils equal, round and reactive to light, visual fields intact CN III, IV, VI:  full range of motion, no nystagmus, no ptosis CN V: facial sensation intact CN VII: upper and lower face symmetric CN VIII: hearing intact to conversation Bulk & Tone: normal, no fasciculations. Motor: 5/5 throughout with no pronator drift. Sensation: intact to light touch, cold, pin, vibration sense.  No extinction to double simultaneous stimulation.  Romberg test negative Deep Tendon Reflexes: +1 throughout, no ankle clonus Cerebellar: no incoordination on finger to nose testing Gait: antalgic gait due to left ankle pain Tremor: none  IMPRESSION: This is a 69  year old man with bilateral leg pain, presenting for worsening leg pain, worse on the left ankle. EMG/NCV in 2019 normal, he does not describe typical  neuropathic pain. Pain is more localized over the left ankle with antalgic gait, swelling of the left ankle. We discussed increasing gabapentin to 900mg  qhs for symptomatic treatment. He reports being told he has bone spurs in the ankle, refer to Podiatry to evaluation and management of left ankle pain/swelling. Follow-up with Dr. Posey Pronto as scheduled in August, he knows to call for any changes.    Thank you for allowing me to participate in the care of this patient. Please do not hesitate to call for any questions or concerns.   Ellouise Newer, M.D.  CC: Jodi Mourning, FNP

## 2020-04-04 ENCOUNTER — Ambulatory Visit: Payer: Medicare Other | Admitting: Adult Health

## 2020-04-04 ENCOUNTER — Encounter: Payer: Self-pay | Admitting: Adult Health

## 2020-04-04 ENCOUNTER — Other Ambulatory Visit: Payer: Self-pay

## 2020-04-04 VITALS — BP 108/61 | HR 75 | Ht 73.0 in | Wt 200.0 lb

## 2020-04-04 DIAGNOSIS — G4733 Obstructive sleep apnea (adult) (pediatric): Secondary | ICD-10-CM | POA: Diagnosis not present

## 2020-04-04 DIAGNOSIS — Z9989 Dependence on other enabling machines and devices: Secondary | ICD-10-CM | POA: Diagnosis not present

## 2020-04-04 NOTE — Patient Instructions (Signed)
Your Plan:  Continue current CPAP settings and continue to follow with your DME company for needed supplies  Speak with DME company regarding use of Dreamware masks due help decrease leaks   Follow up in 1 year or call earlier if needed     Thank you for coming to see [place] at [facility]. I hope we have been able to provide you high quality care today.  You may receive a patient satisfaction survey over the next few weeks. We would appreciate your feedback and comments so that we may continue to improve ourselves and the health of our patients.

## 2020-04-04 NOTE — Progress Notes (Signed)
Order for [name] REFITTING sent to [facility] via [employer]. Confirmation received that the order transmitted was successful.

## 2020-04-04 NOTE — Progress Notes (Signed)
PATIENT: [name] DOB: 1950-12-09  REASON FOR VISIT: follow up HISTORY FROM: patient  HISTORY OF PRESENT ILLNESS: [name] is a 69 y.o. male with PMH of CAD s/p PCI, RCEA, GERD, HLD, and HTN who is being followed in this office for OSA on CPAP management.  He was initially evaluated by Dr. Brett Fairy on 07/14/2018 with prior diagnosis of OSA on CPAP and due to recent diagnosis of atrial fibrillation, was referred for ongoing apnea management.  He underwent HST which confirmed mild OSA with AHI at 13 and recommended continuation of auto CPAP.  Initial compliance visit on 12/12/2018 doing well with adequate management of sleep apnea.  Today, 04/04/2020, [name] is being seen for CPAP follow-up.  Compliance report reviewed which shows 30 out of 30 usage days with 30 days greater than 4 hours per 100% compliance.  Average usage 7 hours and 44 minutes.  Residual AHI 0.8 with min pressure 7 and max pressure 13 with a EPR level 1 and pressure in the 95th percentile 8.7.  Leaks in the 95th percentile 70.4.  He continues to tolerate CPAP well without difficulty. He does report leaks occur when he turns from side to side but will resolve after readjustment. ESS 0. Continues to follow with cardiology and PCP routinely. No concerns at this time.      REVIEW OF SYSTEMS: Out of a complete 14 system review of symptoms, the patient complains only of the following symptoms, apnea and all other reviewed systems are negative.    ALLERGIES: Allergies  Allergen Reactions  . Felodipine Swelling    Legs became swollen  . Niacin Other (See Comments)    Made patient feel flushed    HOME MEDICATIONS: Outpatient Medications Prior to Visit  Medication Sig Dispense Refill  . amLODipine (NORVASC) 10 MG tablet Take 1 tablet (10 mg total) by mouth daily. 90 tablet 3  . apixaban (ELIQUIS) 5 MG TABS tablet Take 1 tablet (5 mg total) by mouth 2 (two) times daily. 180 tablet 2  . b complex vitamins capsule Take  1 capsule by mouth daily.    Marland Kitchen doxazosin (CARDURA) 2 MG tablet TAKE 1 TABLET BY MOUTH AT  BEDTIME 90 tablet 3  . ezetimibe (ZETIA) 10 MG tablet Take 1 tablet (10 mg total) by mouth daily. 90 tablet 3  . finasteride (PROSCAR) 5 MG tablet Take 1 tablet (5 mg total) by mouth daily. 90 tablet 2  . gabapentin (NEURONTIN) 300 MG capsule Take 3 capsules every night 90 capsule 11  . hydrochlorothiazide (HYDRODIURIL) 25 MG tablet Take 1 tablet (25 mg total) by mouth daily. 90 tablet 1  . isosorbide mononitrate (IMDUR) 30 MG 24 hr tablet Take 1 tablet (30 mg total) by mouth daily. 90 tablet 1  . metoprolol tartrate (LOPRESSOR) 25 MG tablet Take 1 tablet (25 mg total) by mouth daily as needed (as needed for palpitations/afib.). 60 tablet 3  . nitroGLYCERIN (NITROSTAT) 0.4 MG SL tablet Place 1 tablet (0.4 mg total) under the tongue every 5 (five) minutes as needed. Call 9-1-1 if need more than 2. 25 tablet 0  . ondansetron (ZOFRAN ODT) 8 MG disintegrating tablet Take 1 tablet (8 mg total) by mouth every 8 (eight) hours as needed for nausea or vomiting. 20 tablet 0  . pantoprazole (PROTONIX) 40 MG tablet Take 1 tablet (40 mg total) by mouth daily. 90 tablet 3  . polyethylene glycol (MIRALAX / GLYCOLAX) 17 g packet Take 17 g by mouth  daily as needed for mild constipation. 30 each 0  . potassium chloride (KLOR-CON) 10 MEQ tablet TAKE 1 TABLET BY MOUTH DAILY 90 tablet 1  . ramipril (ALTACE) 10 MG capsule Take 1 capsule (10 mg total) by mouth 2 (two) times daily. 180 capsule 1  . rosuvastatin (CRESTOR) 40 MG tablet Take 40 mg by mouth daily.    Marland Kitchen tiZANidine (ZANAFLEX) 2 MG tablet Take one tablet at bedtime as needed for pain, if tolerating, ok to take twice daily as needed 90 tablet 3   No facility-administered medications prior to visit.    PAST MEDICAL HISTORY: Past Medical History:  Diagnosis Date  . Acute left ankle pain 08/05/2018  . AKI (acute kidney injury) ([place]) 06/05/2019  . Atrial fibrillation ([place])    . Atrial fibrillation with RVR ([place]) 06/29/2019  . Bradycardia   . Bradycardia 04/26/2018  . Carotid artery disease ([place])    Right CEA;  dopplers 6/12:  0-39% bilat ICA  . Coronary artery disease    s/p BMS to RCA 2002; [place] 2008: oLAD 40%, pRCA stent ok with 20%, EF 60%); Myoview scan in March 2011 which was negative for ischemia   . Coronary atherosclerosis 12/25/2008   Qualifier: Diagnosis of  By: Fuller Plan MD Lamont Snowball T   . COVID-19 virus infection 06/05/2019  . Dizziness 04/26/2018  . Essential hypertension 10/08/2007   Qualifier: Diagnosis of  By: Danny Lawless CMA, [place]    . GASTROESOPHAGEAL REFLUX DISEASE 10/08/2007   Qualifier: Diagnosis of  By: [place], [place]    . GERD (gastroesophageal reflux disease)   . Hyperlipidemia   . Hypersomnia with sleep apnea 12/05/2015  . Hypertension   . MYOCARDIAL INFARCTION 02/03/2008   Qualifier: History of  By: Doy Mince LPN, [place]    . Myocardial infarction ([place])   . Obstructive sleep apnea 02/03/2008   NPSG; 02/04/2008-mild OSA-RDI 5.1 per hour CPAP 11 cm H2O.       . OSA on CPAP   . Plantar fasciitis, bilateral 12/13/2012   Injection Right heel Feb '14   . Preoperative clearance 04/26/2018  . Syncope 06/05/2019  . Typical atrial flutter ([place])   . Unstable angina ([place]) 12/02/2015    PAST SURGICAL HISTORY: Past Surgical History:  Procedure Laterality Date  . ATRIAL FIBRILLATION ABLATION N/A 08/01/2019   Procedure: ATRIAL FIBRILLATION ABLATION;  Surgeon: Thompson Grayer, MD;  Location: [facility];  Service: Cardiovascular;  Laterality: N/A;  . CARDIAC CATHETERIZATION  06/01/2007    EF of 65% -- Nonobstructive CAD with 40% ostial stenosis in the LAD, no significant obstruction in the circumflex artery, 20% narrowing within the stent in the proximal to mid right coronary and normal LV function  . CARDIAC CATHETERIZATION  02/02/2006   EF of 50%  . CARDIAC CATHETERIZATION  01/19/2002   EF of 60%  . CARDIAC CATHETERIZATION N/A 12/03/2015    Procedure: Left Heart Cath and Coronary Angiography;  Surgeon: Burnell Blanks, MD;  Location: [facility];  Service: Cardiovascular;  Laterality: N/A;  . CAROTID ENDARTERECTOMY  06/26/2002   right  . CORONARY ANGIOPLASTY WITH STENT PLACEMENT  03/08/2000   Bare-metal stent in RCA, in [place]  . KNEE ARTHROSCOPY     right  . MOLE REMOVAL     Prior moles removed    FAMILY HISTORY: Family History  Problem Relation Age of Onset  . Heart failure Father   . Heart disease Father   . Peripheral vascular disease Mother  with pacemaker, and had a carotid endarterectomy  . Heart disease Mother   . Hypertension Brother   . Hypertension Brother     SOCIAL HISTORY: Social History   Socioeconomic History  . Marital status: Married    Spouse name: Not on file  . Number of children: 3  . Years of education: 16  . Highest education level: High school graduate  Occupational History  . Occupation: Architect  Tobacco Use  . Smoking status: Former Smoker    Packs/day: 1.00    Years: 30.00    Pack years: 30.00    Types: Cigarettes    Quit date: 10/19/2000    Years since quitting: 19.4  . Smokeless tobacco: Current User    Types: Snuff  . Tobacco comment: Dips snuff.   Vaping Use  . Vaping Use: Never used  Substance and Sexual Activity  . Alcohol use: Yes    Alcohol/week: 1.0 standard drink    Types: 1 Cans of beer per week    Comment: Occasional beer  . Drug use: No  . Sexual activity: Yes  Other Topics Concern  . Not on file  Social History Narrative   Lives with wife in a one story home.  Has 3 children.  Works in Architect.  Education: high school. Patient is right handed.   Social Determinants of Health   Financial Resource Strain:   . Difficulty of Paying Living Expenses:   Food Insecurity:   . Worried About Charity fundraiser in the Last Year:   . Arboriculturist in the Last Year:   Transportation Needs:   . Film/video editor  (Medical):   Marland Kitchen Lack of Transportation (Non-Medical):   Physical Activity:   . Days of Exercise per Week:   . Minutes of Exercise per Session:   Stress:   . Feeling of Stress :   Social Connections:   . Frequency of Communication with Friends and Family:   . Frequency of Social Gatherings with Friends and Family:   . Attends Religious Services:   . Active Member of Clubs or Organizations:   . Attends Archivist Meetings:   Marland Kitchen Marital Status:   Intimate Partner Violence:   . Fear of Current or Ex-Partner:   . Emotionally Abused:   Marland Kitchen Physically Abused:   . Sexually Abused:       PHYSICAL EXAM  Vitals:   04/04/20 1233  BP: 108/61  Pulse: 75  Weight: 200 lb (90.7 kg)  Height: 6\' 1"  (1.854 m)   Body mass index is 26.39 kg/m.  Generalized: Well developed, pleasant middle-aged Caucasian male, in no acute distress   Neurological examination  Mentation: Alert oriented to time, place, history taking. Follows all commands speech and language fluent Cranial nerve II-XII: Pupils were equal round reactive to light. Extraocular movements were full, visual field were full on confrontational test. Facial sensation and strength were normal. Uvula tongue midline. Head turning and shoulder shrug  were normal and symmetric. Motor: The motor testing reveals 5 over 5 strength of all 4 extremities. Good symmetric motor tone is noted throughout.  Sensory: Sensory testing is intact to soft touch on all 4 extremities. No evidence of extinction is noted.  Coordination: Cerebellar testing reveals good finger-nose-finger and heel-to-shin bilaterally.  Gait and station: Gait is normal. Tandem gait is normal. Romberg is negative. No drift is seen.  Reflexes: Deep tendon reflexes are symmetric and normal bilaterally.       ASSESSMENT AND  PLAN 69 y.o. year old male  has a past medical history of Acute left ankle pain (08/05/2018), AKI (acute kidney injury) ([place]) (06/05/2019), Atrial  fibrillation ([place]), Atrial fibrillation with RVR ([place]) (06/29/2019), Bradycardia, Bradycardia (04/26/2018), Carotid artery disease ([place]), Coronary artery disease, Coronary atherosclerosis (12/25/2008), COVID-19 virus infection (06/05/2019), Dizziness (04/26/2018), Essential hypertension (10/08/2007), GASTROESOPHAGEAL REFLUX DISEASE (10/08/2007), GERD (gastroesophageal reflux disease), Hyperlipidemia, Hypersomnia with sleep apnea (12/05/2015), Hypertension, MYOCARDIAL INFARCTION (02/03/2008), Myocardial infarction [facility]), Obstructive sleep apnea (02/03/2008), OSA on CPAP, Plantar fasciitis, bilateral (12/13/2012), Preoperative clearance (04/26/2018), Syncope (06/05/2019), Typical atrial flutter ([place]), and Unstable angina ([place]) (12/02/2015). here for follow up regarding use of CPAP for sleep apnea management.   Continue current settings of min pressure 7 and max pressure 13 with EPR level 1 as apnea continues to be well managed.  Advised to follow-up with DME company in regards to ongoing high leak rate and possible benefit with use of different mask type.  He also continue to follow with DME company for any supplies or CPAP related concerns.   Follow-up in 1 year or earlier if needed  CC: Valere Dross, Marvis Repress, FNP Larey Seat, MD   Greater than 50% of this 25-minute visit was spent reviewing his download report, discussion regarding importance as well as high leak rate, education on importance of ongoing CPAP use and answered all questions to patient satisfaction   Frann Rider, [facility]  [facility] [address] [place],  [address]  Phone [phone] Fax [phone] Note: This document was prepared with digital dictation and possible smart phrase technology. Any transcriptional errors that result from this process are unintentional.

## 2020-04-05 ENCOUNTER — Other Ambulatory Visit: Payer: Self-pay | Admitting: Family

## 2020-04-08 ENCOUNTER — Other Ambulatory Visit: Payer: Self-pay | Admitting: Podiatry

## 2020-04-08 ENCOUNTER — Other Ambulatory Visit: Payer: Self-pay

## 2020-04-08 ENCOUNTER — Ambulatory Visit: Payer: Medicare Other | Admitting: Podiatry

## 2020-04-08 ENCOUNTER — Ambulatory Visit (INDEPENDENT_AMBULATORY_CARE_PROVIDER_SITE_OTHER): Payer: Medicare Other

## 2020-04-08 DIAGNOSIS — M24173 Other articular cartilage disorders, unspecified ankle: Secondary | ICD-10-CM

## 2020-04-08 DIAGNOSIS — M25572 Pain in left ankle and joints of left foot: Secondary | ICD-10-CM

## 2020-04-08 DIAGNOSIS — M19079 Primary osteoarthritis, unspecified ankle and foot: Secondary | ICD-10-CM

## 2020-04-08 DIAGNOSIS — M249 Joint derangement, unspecified: Secondary | ICD-10-CM | POA: Diagnosis not present

## 2020-04-08 DIAGNOSIS — M779 Enthesopathy, unspecified: Secondary | ICD-10-CM

## 2020-04-08 DIAGNOSIS — M21172 Varus deformity, not elsewhere classified, left ankle: Secondary | ICD-10-CM | POA: Diagnosis not present

## 2020-04-08 DIAGNOSIS — M25472 Effusion, left ankle: Secondary | ICD-10-CM

## 2020-04-09 ENCOUNTER — Telehealth: Payer: Self-pay | Admitting: *Deleted

## 2020-04-09 ENCOUNTER — Other Ambulatory Visit: Payer: Self-pay | Admitting: Family

## 2020-04-09 DIAGNOSIS — M79673 Pain in unspecified foot: Secondary | ICD-10-CM

## 2020-04-09 NOTE — Telephone Encounter (Signed)
I spoke with Gerrianne Scale, and asked if I should fax Dr. Nona Dell referrals directly to him and she gave me the referral fax [phone]. Prepared referral, and demographics to be faxed to [facility] - Dr. Wylene Simmer, once clinicals of 04/08/2020 are available.

## 2020-04-09 NOTE — Telephone Encounter (Signed)
-----   Message from Evelina Bucy, DPM sent at 04/08/2020  2:28 PM EDT ----- Can we refer to Dr. Doran Durand in [facility] for eval for ankle arthritis, possible ankle replacement consult

## 2020-04-10 ENCOUNTER — Other Ambulatory Visit: Payer: Self-pay | Admitting: Family

## 2020-04-17 ENCOUNTER — Other Ambulatory Visit: Payer: Self-pay | Admitting: Podiatry

## 2020-04-17 DIAGNOSIS — M25572 Pain in left ankle and joints of left foot: Secondary | ICD-10-CM

## 2020-04-17 DIAGNOSIS — M779 Enthesopathy, unspecified: Secondary | ICD-10-CM

## 2020-04-20 ENCOUNTER — Other Ambulatory Visit: Payer: Self-pay | Admitting: Family

## 2020-04-21 NOTE — Progress Notes (Signed)
  Subjective:  Patient ID: [name], male    DOB: 27-Aug-1951,  MRN: [id number]  Chief Complaint  Patient presents with  . Foot Pain    Lt lateral ankle pain and swelling x 8-9 mo; 9/10 shapr pains -pt deneis injury -wrose at [employer] or walkin a lot -PCP checked bllood work and Human resources officer and they said it was good PCP dx with bone spur and arthritis -had a shot with not help it made it wourse Tx: Topical pain cream     69 y.o. male presents with the above complaint. History confirmed with patient.   Objective:  Physical Exam: warm, good capillary refill, no trophic changes or ulcerative lesions, normal DP and PT pulses and normal sensory exam. Left Foot: Varus deformity of the ankle he does have some range of motion of the ankle though this is likely some subtalar compensation.  Pain palpation about the lateral gutter, medial gutter anterior joint line, ATFL.  No images are attached to the encounter.  Radiographs: X-ray of the left ankle: 3 views of the left ankle show varus deformity with joint space narrowing anterior lipping evidence of prior malleolar fracture versus osteophyte degeneration of the lateral gutter Assessment:   1. Varus deformity, not elsewhere classified, left ankle   2. Ankle arthritis   3. Joint derangement of ankle or foot   4. Pain and swelling of left ankle    Plan:  Patient was evaluated and treated and all questions answered.  Ankle Arthritis -Lengthy discussion had with patient about surgical options.  It does appear that he has failed most conservative therapy.  Given the extent of arthritis on his x-rays I do not think injections alone would alleviate his pain though I did discuss with the patient that I am unsure of the technique that the injection was given.  We did discuss ankle replacement versus ankle fusion he does not wish for ankle fusion he would like to pursue the possibility of ankle replacement.  I discussed that for this procedure I will have  to refer him to another surgeon and recommended Dr. Doran Durand for evaluation.  Will place referral. -I would be happy to make him an AFO brace that we try to alleviate some of his pain  Return in about 6 weeks (around 05/20/2020) for Ankle arthritis f/u .

## 2020-04-22 ENCOUNTER — Encounter: Payer: Self-pay | Admitting: Podiatry

## 2020-04-23 ENCOUNTER — Telehealth: Payer: Self-pay | Admitting: *Deleted

## 2020-04-23 NOTE — Telephone Encounter (Signed)
I informed pt the referral to Dr. Doran Durand at [facility] [phone] had been sent 04/09/2020 and he could contact their office to check his referral status.

## 2020-04-23 NOTE — Telephone Encounter (Signed)
-----   Message from Evelina Bucy, DPM sent at 04/21/2020  9:13 PM EDT ----- Please send the referral to Dr. Doran Durand.

## 2020-06-10 ENCOUNTER — Ambulatory Visit: Payer: Medicare Other | Admitting: Neurology

## 2020-07-09 ENCOUNTER — Other Ambulatory Visit: Payer: Self-pay | Admitting: Cardiovascular Disease

## 2020-07-30 NOTE — Progress Notes (Signed)
Cardiology Office Note    Date:  07/31/2020   ID:  [name], [name] [birth year], MRN [id number]  PCP:  [name], FNP  Cardiologist: Lauree Chandler, MD EPS: None  No chief complaint on file.   History of Present Illness:  [name] is a 69 y.o. male with history of CAD BMS to the RCA in 2002, 2008 nonobstructive disease, Myoview 2011 no ischemia, cath 2017 patent RCA stent and mild nonobstructive disease in the RCA circumflex and LAD, NST 2019 no ischemia, PAF status post A. fib ablation 07/2019 remains on Eliquis followed by Dr. Rayann Heman, Hypertension, OSA uses CPAP,HLD,Carotid disease status post RCEA, CKD stage III ,History of bradycardia resolved with stopping atenolol.  Patient comes in for f/u. Denies chest pain, shortness of breath, dizziness or presyncope. Has had palpitations on twice since ablation, lasting less than 10 min. Last episode a couple months ago associated with some burning sensation. Was playing horse shoes at the time and eased with rest and as needed metoprolol. Needs ankle surgery but hasn't seen orthopedist yet. Fishing, playing horse shoes, corn hole. No regular exercise.      Past Medical History:  Diagnosis Date  . Acute left ankle pain 08/05/2018  . AKI (acute kidney injury) ([place]) 06/05/2019  . Atrial fibrillation ([place])   . Atrial fibrillation with RVR ([place]) 06/29/2019  . Bradycardia   . Bradycardia 04/26/2018  . Carotid artery disease ([place])    Right CEA;  dopplers 6/12:  0-39% bilat ICA  . Coronary artery disease    s/p BMS to RCA 2002; [place] 2008: oLAD 40%, pRCA stent ok with 20%, EF 60%); Myoview scan in March 2011 which was negative for ischemia   . Coronary atherosclerosis 12/25/2008   Qualifier: Diagnosis of  By: Fuller Plan MD Lamont Snowball T   . COVID-19 virus infection 06/05/2019  . Dizziness 04/26/2018  . Essential hypertension 10/08/2007   Qualifier: Diagnosis of  By: Danny Lawless CMA, [place]    . GASTROESOPHAGEAL REFLUX DISEASE  10/08/2007   Qualifier: Diagnosis of  By: Emily, [place]    . GERD (gastroesophageal reflux disease)   . Hyperlipidemia   . Hypersomnia with sleep apnea 12/05/2015  . Hypertension   . MYOCARDIAL INFARCTION 02/03/2008   Qualifier: History of  By: Doy Mince LPN, [place]    . Myocardial infarction ([place])   . Obstructive sleep apnea 02/03/2008   NPSG; 02/04/2008-mild OSA-RDI 5.1 per hour CPAP 11 cm H2O.       . OSA on CPAP   . Plantar fasciitis, bilateral 12/13/2012   Injection Right heel Feb '14   . Preoperative clearance 04/26/2018  . Syncope 06/05/2019  . Typical atrial flutter ([place])   . Unstable angina ([place]) 12/02/2015    Past Surgical History:  Procedure Laterality Date  . ATRIAL FIBRILLATION ABLATION N/A 08/01/2019   Procedure: ATRIAL FIBRILLATION ABLATION;  Surgeon: Thompson Grayer, MD;  Location: [facility];  Service: Cardiovascular;  Laterality: N/A;  . CARDIAC CATHETERIZATION  06/01/2007    EF of 65% -- Nonobstructive CAD with 40% ostial stenosis in the LAD, no significant obstruction in the circumflex artery, 20% narrowing within the stent in the proximal to mid right coronary and normal LV function  . CARDIAC CATHETERIZATION  02/02/2006   EF of 50%  . CARDIAC CATHETERIZATION  01/19/2002   EF of 60%  . CARDIAC CATHETERIZATION N/A 12/03/2015   Procedure: Left Heart Cath and Coronary Angiography;  Surgeon: Burnell Blanks, MD;  Location: [facility]  INVASIVE CV LAB;  Service: Cardiovascular;  Laterality: N/A;  . CAROTID ENDARTERECTOMY  06/26/2002   right  . CORONARY ANGIOPLASTY WITH STENT PLACEMENT  03/08/2000   Bare-metal stent in RCA, in [place]  . KNEE ARTHROSCOPY     right  . MOLE REMOVAL     Prior moles removed    Current Medications: Current Meds  Medication Sig  . amLODipine (NORVASC) 10 MG tablet TAKE 1 TABLET BY MOUTH  DAILY  . b complex vitamins capsule Take 1 capsule by mouth daily.  Marland Kitchen doxazosin (CARDURA) 2 MG tablet TAKE 1 TABLET BY MOUTH AT  BEDTIME  .  ELIQUIS 5 MG TABS tablet TAKE 1 TABLET BY MOUTH  TWICE DAILY  . ezetimibe (ZETIA) 10 MG tablet TAKE 1 TABLET BY MOUTH  DAILY  . finasteride (PROSCAR) 5 MG tablet TAKE 1 TABLET BY MOUTH  DAILY  . gabapentin (NEURONTIN) 300 MG capsule Take 3 capsules every night  . hydrochlorothiazide (HYDRODIURIL) 25 MG tablet Take 1 tablet (25 mg total) by mouth daily.  . isosorbide mononitrate (IMDUR) 30 MG 24 hr tablet Take 1 tablet (30 mg total) by mouth daily. Please keep upcoming appt in October before anymore refills. Thank you  . metoprolol tartrate (LOPRESSOR) 25 MG tablet TAKE 1 TABLET BY MOUTH  DAILY AS NEEDED FOR  PALPITATIONS / AFIB.  Marland Kitchen nitroGLYCERIN (NITROSTAT) 0.4 MG SL tablet Place 1 tablet (0.4 mg total) under the tongue every 5 (five) minutes as needed. Call 9-1-1 if need more than 2.  . ondansetron (ZOFRAN ODT) 8 MG disintegrating tablet Take 1 tablet (8 mg total) by mouth every 8 (eight) hours as needed for nausea or vomiting.  . pantoprazole (PROTONIX) 40 MG tablet Take 1 tablet (40 mg total) by mouth daily.  . polyethylene glycol (MIRALAX / GLYCOLAX) 17 g packet Take 17 g by mouth daily as needed for mild constipation.  . potassium chloride (KLOR-CON) 10 MEQ tablet TAKE 1 TABLET BY MOUTH DAILY  . ramipril (ALTACE) 10 MG capsule Take 1 capsule (10 mg total) by mouth 2 (two) times daily.  . rosuvastatin (CRESTOR) 40 MG tablet TAKE 1 TABLET BY MOUTH  DAILY  . tiZANidine (ZANAFLEX) 2 MG tablet Take one tablet at bedtime as needed for pain, if tolerating, ok to take twice daily as needed     Allergies:   Felodipine and Niacin   Social History   Socioeconomic History  . Marital status: Married    Spouse name: Not on file  . Number of children: 3  . Years of education: 41  . Highest education level: High school graduate  Occupational History  . Occupation: Architect  Tobacco Use  . Smoking status: Former Smoker    Packs/day: 1.00    Years: 30.00    Pack years: 30.00    Types:  Cigarettes    Quit date: 10/19/2000    Years since quitting: 19.7  . Smokeless tobacco: Current User    Types: Snuff  . Tobacco comment: Dips snuff.   Vaping Use  . Vaping Use: Never used  Substance and Sexual Activity  . Alcohol use: Yes    Alcohol/week: 1.0 standard drink    Types: 1 Cans of beer per week    Comment: Occasional beer  . Drug use: No  . Sexual activity: Yes  Other Topics Concern  . Not on file  Social History Narrative   Lives with wife in a one story home.  Has 3 children.  Works in Architect.  Education: high school. Patient is right handed.   Social Determinants of Health   Financial Resource Strain:   . Difficulty of Paying Living Expenses: Not on file  Food Insecurity:   . Worried About Charity fundraiser in the Last Year: Not on file  . Ran Out of Food in the Last Year: Not on file  Transportation Needs:   . Lack of Transportation (Medical): Not on file  . Lack of Transportation (Non-Medical): Not on file  Physical Activity:   . Days of Exercise per Week: Not on file  . Minutes of Exercise per Session: Not on file  Stress:   . Feeling of Stress : Not on file  Social Connections:   . Frequency of Communication with Friends and Family: Not on file  . Frequency of Social Gatherings with Friends and Family: Not on file  . Attends Religious Services: Not on file  . Active Member of Clubs or Organizations: Not on file  . Attends Archivist Meetings: Not on file  . Marital Status: Not on file     Family History:  The patient's family history includes Heart disease in his father and mother; Heart failure in his father; Hypertension in his brother and brother; Peripheral vascular disease in his mother.   ROS:   Please see the history of present illness.    ROS All other systems reviewed and are negative.   PHYSICAL EXAM:   VS:  BP 116/62   Pulse 60   Ht 6\' 1"  (1.854 m)   Wt 201 lb (91.2 kg)   SpO2 97%   BMI 26.52 kg/m   Physical  Exam  GEN: Well nourished, well developed, in no acute distress  Neck:  bilateral carotid bruits, no JVD or masses Cardiac:RRR; no murmurs, rubs, or gallops  Respiratory:  clear to auscultation bilaterally, normal work of breathing GI: soft, nontender, nondistended, + BS Ext: Left ankle in brace, without cyanosis, clubbing, or edema, Good distal pulses bilaterally Neuro:  Alert and Oriented x 3 Psych: euthymic mood, full affect  Wt Readings from Last 3 Encounters:  07/31/20 201 lb (91.2 kg)  04/04/20 200 lb (90.7 kg)  04/01/20 198 lb 3.2 oz (89.9 kg)      Studies/Labs Reviewed:   EKG:  EKG is  ordered today.  The ekg ordered today demonstrates normal sinus rhythm normal EKG  Recent Labs: No results found for requested labs within last 8760 hours.   Lipid Panel    Component Value Date/Time   CHOL 114 05/31/2018 0539   TRIG 162 (H) 06/19/2019 0430   HDL 30 (L) 05/31/2018 0539   CHOLHDL 3.8 05/31/2018 0539   VLDL 22 05/31/2018 0539   LDLCALC 62 05/31/2018 0539    Additional studies/ records that were reviewed today include:  Limited echo 8/18/2020IMPRESSIONS     1. The left ventricle has normal systolic function, with an ejection  fraction of 60-65%. The cavity size was normal. There is mildly increased  left ventricular wall thickness. Left ventricular diastolic Doppler  parameters are consistent with impaired  relaxation. Indeterminate filling pressures The E/e' is 8-15. No evidence  of left ventricular regional wall motion abnormalities.   2. The right ventricle has normal systolc function. The cavity was  normal. There is no increase in right ventricular wall thickness.   3. The mitral valve is grossly normal.   4. The tricuspid valve was grossly normal.   5. The aortic valve is tricuspid Mild sclerosis of the  aortic valve. No  stenosis of the aortic valve.   SUMMARY    LVEF 60-65%, mild LVH, normal wall motion, grade 1 DD, indeterminate  LV filling pressure,  normal RV systolic function, normal LA size,  mild aortic valve sclerosis, trivial TR, normal RVSP, normal IVC   FINDINGS   Left Ventricle: The left ventricle has normal systolic function, with an  ejection fraction of 60-65%. The cavity size was normal. There is mildly  increased left ventricular wall thickness. Left ventricular diastolic  Doppler parameters are consistent  with impaired relaxation (grade I). Indeterminate filling pressures The  E/e' is 8-15. No evidence of left ventricular regional wall motion  abnormalities.      Lower extremity Dopplers 02/22/2020 Summary:  Right: Resting right ankle-brachial index is within normal range. No  evidence of significant right lower extremity arterial disease. The right  toe-brachial index is normal.   Left: Resting left ankle-brachial index is within normal range. No  evidence of significant left lower extremity arterial disease. The left  toe-brachial index is normal.    Carotid Dopplers 2019  Final Interpretation:  Right Carotid: Velocities in the right ICA are consistent with a 1-39%  stenosis.                Non-hemodynamically significant plaque <50% noted in the  CCA.   Left Carotid: Velocities in the left ICA are consistent with a 1-39%  stenosis.               Non-hemodynamically significant plaque noted in the CCA.   Vertebrals:  Bilateral vertebral arteries demonstrate antegrade flow.  Subclavians: Normal flow hemodynamics were seen in bilateral subclavian               arteries.   *See table(s) above for measurements and observations.  Suggest follow up study in 12 months.      ASSESSMENT:    1. Coronary artery disease involving native coronary artery of native heart without angina pectoris   2. Paroxysmal atrial fibrillation (HCC)   3. Essential hypertension   4. OSA (obstructive sleep apnea)   5. Hyperlipidemia, unspecified hyperlipidemia type   6. Bilateral carotid artery disease, unspecified type ([place])    7. CKD stage 3 due to type 1 diabetes mellitus ([place])      PLAN:  In order of problems listed above:  CAD BMS to the RCA in 2002 2008 nonobstructive disease, Myoview 2011 no ischemia, cath 2017 patent RCA stent and mild nonobstructive disease in the RCA circumflex and LAD, NST 2019 no ischemia no recent angina.  Recommend 150 minutes of exercise weekly.  PAF status post A. fib ablation 07/2019 remains on Eliquis followed by Dr. Rayann Heman had 2 episodes of palpitations since ablation lasting less than 10 minutes and eased with rest and as needed metoprolol  Hypertension blood pressure controlled  OSA uses CPAP every night  HLD check lipids today.  On Zetia  Carotid disease status post R CEA stable in 2019 needs follow-up carotid Dopplers  CKD stage III checking labs today  History of bradycardia resolved with stopping atenolol   Medication Adjustments/Labs and Tests Ordered: Current medicines are reviewed at length with the patient today.  Concerns regarding medicines are outlined above.  Medication changes, Labs and Tests ordered today are listed in the Patient Instructions below. Patient Instructions  Medication Instructions:  Your physician recommends that you continue on your current medications as directed. Please refer to the Current Medication list given to you today.  *  If you need a refill on your cardiac medications before your next appointment, please call your pharmacy*   Lab Work: TODAY: CBC, CMET, FLP  If you have labs (blood work) drawn today and your tests are completely normal, you will receive your results only by: Marland Kitchen MyChart Message (if you have MyChart) OR . A paper copy in the mail If you have any lab test that is abnormal or we need to change your treatment, we will call you to review the results.   Testing/Procedures: Your physician has requested that you have a carotid duplex. This test is an ultrasound of the carotid arteries in your neck. It looks at  blood flow through these arteries that supply the brain with blood. Allow one hour for this exam. There are no restrictions or special instructions.   Follow-Up: At [facility], you and your health needs are our priority.  As part of our continuing mission to provide you with exceptional heart care, we have created designated Provider Care Teams.  These Care Teams include your primary Cardiologist (physician) and Advanced Practice Providers (APPs -  Physician Assistants and Nurse Practitioners) who all work together to provide you with the care you need, when you need it.   Your next appointment:   1 year(s)  The format for your next appointment:   In Person  Provider:   You may see Lauree Chandler, MD or one of the following Advanced Practice Providers on your designated Care Team:    Melina Copa, PA-C  Ermalinda Barrios, PA-C    Other Instructions Your provider recommends that you maintain 150 minutes per week of moderate aerobic activity.      Sumner Boast, PA-C  07/31/2020 10:04 AM    [facility] [address], [place], [place]  [address] Phone: [phone]; Fax: [phone]

## 2020-07-31 ENCOUNTER — Other Ambulatory Visit: Payer: Self-pay

## 2020-07-31 ENCOUNTER — Encounter: Payer: Self-pay | Admitting: Physician Assistant

## 2020-07-31 ENCOUNTER — Ambulatory Visit: Payer: Medicare Other | Admitting: Physician Assistant

## 2020-07-31 VITALS — BP 116/62 | HR 60 | Ht 73.0 in | Wt 201.0 lb

## 2020-07-31 DIAGNOSIS — G4733 Obstructive sleep apnea (adult) (pediatric): Secondary | ICD-10-CM

## 2020-07-31 DIAGNOSIS — E1022 Type 1 diabetes mellitus with diabetic chronic kidney disease: Secondary | ICD-10-CM

## 2020-07-31 DIAGNOSIS — I2510 Atherosclerotic heart disease of native coronary artery without angina pectoris: Secondary | ICD-10-CM | POA: Diagnosis not present

## 2020-07-31 DIAGNOSIS — I10 Essential (primary) hypertension: Secondary | ICD-10-CM | POA: Diagnosis not present

## 2020-07-31 DIAGNOSIS — N183 Chronic kidney disease, stage 3 unspecified: Secondary | ICD-10-CM

## 2020-07-31 DIAGNOSIS — E785 Hyperlipidemia, unspecified: Secondary | ICD-10-CM

## 2020-07-31 DIAGNOSIS — I779 Disorder of arteries and arterioles, unspecified: Secondary | ICD-10-CM

## 2020-07-31 DIAGNOSIS — I480 Paroxysmal atrial fibrillation: Secondary | ICD-10-CM | POA: Diagnosis not present

## 2020-07-31 LAB — COMPREHENSIVE METABOLIC PANEL
ALT: 28 IU/L (ref 0–44)
AST: 22 IU/L (ref 0–40)
Albumin/Globulin Ratio: 2 (ref 1.2–2.2)
Albumin: 4.6 g/dL (ref 3.8–4.8)
Alkaline Phosphatase: 63 IU/L (ref 44–121)
BUN/Creatinine Ratio: 11 (ref 10–24)
BUN: 13 mg/dL (ref 8–27)
Bilirubin Total: 1.2 mg/dL (ref 0.0–1.2)
CO2: 26 mmol/L (ref 20–29)
Calcium: 9.7 mg/dL (ref 8.6–10.2)
Chloride: 101 mmol/L (ref 96–106)
Creatinine, Ser: 1.2 mg/dL (ref 0.76–1.27)
GFR calc Af Amer: 72 mL/min/{1.73_m2} (ref >59)
GFR calc non Af Amer: 62 mL/min/{1.73_m2} (ref >59)
Globulin, Total: 2.3 g/dL (ref 1.5–4.5)
Glucose: 109 mg/dL — ABNORMAL HIGH (ref 65–99)
Potassium: 3.8 mmol/L (ref 3.5–5.2)
Sodium: 138 mmol/L (ref 134–144)
Total Protein: 6.9 g/dL (ref 6.0–8.5)

## 2020-07-31 LAB — CBC
Hematocrit: 39 % (ref 37.5–51.0)
Hemoglobin: 13.1 g/dL (ref 13.0–17.7)
MCH: 30 pg (ref 26.6–33.0)
MCHC: 33.6 g/dL (ref 31.5–35.7)
MCV: 89 fL (ref 79–97)
Platelets: 152 10*3/uL (ref 150–450)
RBC: 4.36 x10E6/uL (ref 4.14–5.80)
RDW: 12.3 % (ref 11.6–15.4)
WBC: 4.4 10*3/uL (ref 3.4–10.8)

## 2020-07-31 LAB — LIPID PANEL
Chol/HDL Ratio: 3.2 ratio (ref 0.0–5.0)
Cholesterol, Total: 115 mg/dL (ref 100–199)
HDL: 36 mg/dL — ABNORMAL LOW (ref >39)
LDL Chol Calc (NIH): 57 mg/dL (ref 0–99)
Triglycerides: 122 mg/dL (ref 0–149)
VLDL Cholesterol Cal: 22 mg/dL (ref 5–40)

## 2020-07-31 NOTE — Patient Instructions (Signed)
Medication Instructions:  Your physician recommends that you continue on your current medications as directed. Please refer to the Current Medication list given to you today.  *If you need a refill on your cardiac medications before your next appointment, please call your pharmacy*   Lab Work: TODAY: CBC, CMET, FLP  If you have labs (blood work) drawn today and your tests are completely normal, you will receive your results only by: Marland Kitchen MyChart Message (if you have MyChart) OR . A paper copy in the mail If you have any lab test that is abnormal or we need to change your treatment, we will call you to review the results.   Testing/Procedures: Your physician has requested that you have a carotid duplex. This test is an ultrasound of the carotid arteries in your neck. It looks at blood flow through these arteries that supply the brain with blood. Allow one hour for this exam. There are no restrictions or special instructions.   Follow-Up: At [facility], you and your health needs are our priority.  As part of our continuing mission to provide you with exceptional heart care, we have created designated Provider Care Teams.  These Care Teams include your primary Cardiologist (physician) and Advanced Practice Providers (APPs -  Physician Assistants and Nurse Practitioners) who all work together to provide you with the care you need, when you need it.   Your next appointment:   1 year(s)  The format for your next appointment:   In Person  Provider:   You may see Lauree Chandler, MD or one of the following Advanced Practice Providers on your designated Care Team:    Melina Copa, PA-C  Ermalinda Barrios, PA-C    Other Instructions Your provider recommends that you maintain 150 minutes per week of moderate aerobic activity.

## 2020-08-08 ENCOUNTER — Ambulatory Visit (HOSPITAL_COMMUNITY)
Admission: RE | Admit: 2020-08-08 | Payer: Medicare Other | Source: Ambulatory Visit | Attending: Physician Assistant | Admitting: Physician Assistant

## 2020-08-13 ENCOUNTER — Other Ambulatory Visit (HOSPITAL_COMMUNITY): Payer: Self-pay | Admitting: Physician Assistant

## 2020-08-13 ENCOUNTER — Ambulatory Visit (HOSPITAL_COMMUNITY)
Admission: RE | Admit: 2020-08-13 | Discharge: 2020-08-13 | Disposition: A | Discharge status: Home / Self Care | Payer: Medicare Other | Source: Ambulatory Visit | Attending: Cardiovascular Disease | Admitting: Cardiovascular Disease

## 2020-08-13 ENCOUNTER — Other Ambulatory Visit: Payer: Self-pay

## 2020-08-13 DIAGNOSIS — Z9889 Other specified postprocedural states: Secondary | ICD-10-CM

## 2020-08-13 DIAGNOSIS — I779 Disorder of arteries and arterioles, unspecified: Secondary | ICD-10-CM

## 2020-08-13 DIAGNOSIS — I6521 Occlusion and stenosis of right carotid artery: Secondary | ICD-10-CM

## 2020-08-15 ENCOUNTER — Other Ambulatory Visit: Payer: Self-pay | Admitting: Family

## 2020-09-04 ENCOUNTER — Other Ambulatory Visit: Payer: Self-pay | Admitting: Cardiovascular Disease

## 2020-09-06 ENCOUNTER — Telehealth: Payer: Self-pay | Admitting: Family

## 2020-09-06 ENCOUNTER — Other Ambulatory Visit: Payer: Self-pay

## 2020-09-06 ENCOUNTER — Other Ambulatory Visit: Payer: Self-pay | Admitting: Family

## 2020-09-06 DIAGNOSIS — I10 Essential (primary) hypertension: Secondary | ICD-10-CM

## 2020-09-06 MED ORDER — RAMIPRIL 10 MG PO CAPS: 10.0000 mg | ORAL_CAPSULE | Freq: Two times a day (BID) | ORAL | 0 refills | Status: DC

## 2020-09-06 MED ORDER — FINASTERIDE 5 MG PO TABS: 5.0000 mg | ORAL_TABLET | Freq: Every day | ORAL | 0 refills | Status: DC

## 2020-09-06 MED ORDER — ISOSORBIDE MONONITRATE ER 30 MG PO TB24
30.0000 mg | ORAL_TABLET | Freq: Every day | ORAL | 3 refills | Status: DC
Start: 2020-09-06 — End: 2020-10-03

## 2020-09-06 MED ORDER — ROSUVASTATIN CALCIUM 40 MG PO TABS: 40.0000 mg | ORAL_TABLET | Freq: Every day | ORAL | 0 refills | Status: DC

## 2020-09-06 MED ORDER — HYDROCHLOROTHIAZIDE 25 MG PO TABS
25.0000 mg | ORAL_TABLET | Freq: Every day | ORAL | 3 refills | Status: DC
Start: 2020-09-06 — End: 2021-08-08

## 2020-09-06 NOTE — Telephone Encounter (Signed)
Pt's medications were sent to pt's pharmacy as requested. Confirmation received.  

## 2020-09-06 NOTE — Telephone Encounter (Signed)
   1.Medication Requested: finasteride (PROSCAR) 5 MG tablet ramipril (ALTACE) 10 MG capsule rosuvastatin (CRESTOR) 40 MG tablet  2. Pharmacy (Name, Street, [place]): [facility], [address] [place] [address]  3. On Med List: yes  4. Last Visit with PCP: 06/27/2019  5. Next visit date with PCP: 09/09/20   Agent: Please be advised that RX refills may take up to 3 business days. We ask that you follow-up with your pharmacy.

## 2020-09-09 ENCOUNTER — Telehealth: Payer: Self-pay | Admitting: Family

## 2020-09-09 ENCOUNTER — Ambulatory Visit (INDEPENDENT_AMBULATORY_CARE_PROVIDER_SITE_OTHER): Payer: Medicare Other | Admitting: Family

## 2020-09-09 ENCOUNTER — Other Ambulatory Visit: Payer: Self-pay | Admitting: Family

## 2020-09-09 ENCOUNTER — Encounter: Payer: Self-pay | Admitting: Family

## 2020-09-09 ENCOUNTER — Other Ambulatory Visit: Payer: Self-pay

## 2020-09-09 VITALS — BP 140/76 | HR 77 | Temp 98.4°F | Ht 73.0 in | Wt 203.0 lb

## 2020-09-09 DIAGNOSIS — Z0000 Encounter for general adult medical examination without abnormal findings: Secondary | ICD-10-CM | POA: Diagnosis not present

## 2020-09-09 DIAGNOSIS — Z125 Encounter for screening for malignant neoplasm of prostate: Secondary | ICD-10-CM

## 2020-09-09 DIAGNOSIS — I25111 Atherosclerotic heart disease of native coronary artery with angina pectoris with documented spasm: Secondary | ICD-10-CM

## 2020-09-09 DIAGNOSIS — Z1211 Encounter for screening for malignant neoplasm of colon: Secondary | ICD-10-CM | POA: Diagnosis not present

## 2020-09-09 DIAGNOSIS — I10 Essential (primary) hypertension: Secondary | ICD-10-CM

## 2020-09-09 DIAGNOSIS — R972 Elevated prostate specific antigen [PSA]: Secondary | ICD-10-CM

## 2020-09-09 LAB — PSA: PSA: 7.3 ng/mL — ABNORMAL HIGH (ref 0.10–4.00)

## 2020-09-09 MED ORDER — POTASSIUM CHLORIDE CRYS ER 10 MEQ PO TBCR: 10.0000 meq | EXTENDED_RELEASE_TABLET | Freq: Every day | ORAL | 1 refills | Status: DC

## 2020-09-09 MED ORDER — METOPROLOL TARTRATE 25 MG PO TABS: ORAL_TABLET | ORAL | 3 refills | Status: DC

## 2020-09-09 MED ORDER — TIZANIDINE HCL 2 MG PO TABS: ORAL_TABLET | ORAL | 3 refills | Status: DC

## 2020-09-09 MED ORDER — RAMIPRIL 10 MG PO CAPS: 10.0000 mg | ORAL_CAPSULE | Freq: Two times a day (BID) | ORAL | 3 refills | Status: DC

## 2020-09-09 MED ORDER — POTASSIUM CHLORIDE CRYS ER 10 MEQ PO TBCR: 10.0000 meq | EXTENDED_RELEASE_TABLET | Freq: Every day | ORAL | 3 refills | Status: DC

## 2020-09-09 MED ORDER — ROSUVASTATIN CALCIUM 40 MG PO TABS: 40.0000 mg | ORAL_TABLET | Freq: Every day | ORAL | 3 refills | Status: DC

## 2020-09-09 MED ORDER — TIZANIDINE HCL 2 MG PO TABS: ORAL_TABLET | ORAL | 0 refills | Status: DC

## 2020-09-09 MED ORDER — FINASTERIDE 5 MG PO TABS: 5.0000 mg | ORAL_TABLET | Freq: Every day | ORAL | 3 refills | Status: DC

## 2020-09-09 NOTE — Progress Notes (Signed)
[name] is a 69 y.o. male with the following history as recorded in EpicCare:  Patient Active Problem List   Diagnosis Date Noted  . Coronary artery disease involving native coronary artery of native heart with angina pectoris with documented spasm ([place]) 09/09/2020  . Atrial fibrillation with RVR ([place]) 06/29/2019  . Syncope 06/05/2019  . COVID-19 virus infection 06/05/2019  . AKI (acute kidney injury) ([place]) 06/05/2019  . Acute left ankle pain 08/05/2018  . Atrial fibrillation ([place]) 05/30/2018  . CAD in native artery   . Preoperative clearance 04/26/2018  . Bradycardia 04/26/2018  . Dizziness 04/26/2018  . Hypersomnia with sleep apnea 12/05/2015  . Unstable angina ([place]) 12/02/2015  . Plantar fasciitis, bilateral 12/13/2012  . Carotid artery disease ([place]) 11/24/2010  . BRADYCARDIA 04/12/2009  . Coronary atherosclerosis 12/25/2008  . Obstructive sleep apnea 02/03/2008  . MYOCARDIAL INFARCTION 02/03/2008  . Hyperlipidemia 10/08/2007  . Essential hypertension 10/08/2007    Current Outpatient Medications  Medication Sig Dispense Refill  . amLODipine (NORVASC) 10 MG tablet TAKE 1 TABLET BY MOUTH  DAILY 90 tablet 3  . b complex vitamins capsule Take 1 capsule by mouth daily.    Marland Kitchen doxazosin (CARDURA) 2 MG tablet TAKE 1 TABLET BY MOUTH AT  BEDTIME 90 tablet 3  . ELIQUIS 5 MG TABS tablet TAKE 1 TABLET BY MOUTH  TWICE DAILY 180 tablet 3  . ezetimibe (ZETIA) 10 MG tablet TAKE 1 TABLET BY MOUTH  DAILY 90 tablet 3  . finasteride (PROSCAR) 5 MG tablet Take 1 tablet (5 mg total) by mouth daily. 90 tablet 3  . hydrochlorothiazide (HYDRODIURIL) 25 MG tablet Take 1 tablet (25 mg total) by mouth daily. 90 tablet 3  . isosorbide mononitrate (IMDUR) 30 MG 24 hr tablet Take 1 tablet (30 mg total) by mouth daily. 90 tablet 3  . metoprolol tartrate (LOPRESSOR) 25 MG tablet TAKE 1 TABLET BY MOUTH  DAILY AS NEEDED FOR  PALPITATIONS / AFIB. 90 tablet 3  . nitroGLYCERIN (NITROSTAT) 0.4 MG SL tablet Place  1 tablet (0.4 mg total) under the tongue every 5 (five) minutes as needed. Call 9-1-1 if need more than 2. 25 tablet 0  . pantoprazole (PROTONIX) 40 MG tablet Take 1 tablet (40 mg total) by mouth daily. 90 tablet 3  . potassium chloride (KLOR-CON) 10 MEQ tablet Take 1 tablet (10 mEq total) by mouth daily. 90 tablet 3  . ramipril (ALTACE) 10 MG capsule Take 1 capsule (10 mg total) by mouth 2 (two) times daily. 180 capsule 3  . rosuvastatin (CRESTOR) 40 MG tablet Take 1 tablet (40 mg total) by mouth daily. 90 tablet 3  . tiZANidine (ZANAFLEX) 2 MG tablet Take one tablet at bedtime as needed for pain, if tolerating, ok to take twice daily as needed 90 tablet 3  . gabapentin (NEURONTIN) 300 MG capsule Take 3 capsules every night (Patient not taking: Reported on 09/09/2020) 90 capsule 11   No current facility-administered medications for this visit.    Allergies: Felodipine and Niacin  Past Medical History:  Diagnosis Date  . Acute left ankle pain 08/05/2018  . AKI (acute kidney injury) ([place]) 06/05/2019  . Atrial fibrillation ([place])   . Atrial fibrillation with RVR ([place]) 06/29/2019  . Bradycardia   . Bradycardia 04/26/2018  . Carotid artery disease ([place])    Right CEA;  dopplers 6/12:  0-39% bilat ICA  . Coronary artery disease    s/p BMS to RCA 2002; [place] 2008: oLAD 40%, pRCA stent  ok with 20%, EF 60%); Myoview scan in March 2011 which was negative for ischemia   . Coronary atherosclerosis 12/25/2008   Qualifier: Diagnosis of  By: Fuller Plan MD Lamont Snowball T   . COVID-19 virus infection 06/05/2019  . Dizziness 04/26/2018  . Essential hypertension 10/08/2007   Qualifier: Diagnosis of  By: Danny Lawless CMA, [place]    . GASTROESOPHAGEAL REFLUX DISEASE 10/08/2007   Qualifier: Diagnosis of  By: [place], [place]    . GERD (gastroesophageal reflux disease)   . Hyperlipidemia   . Hypersomnia with sleep apnea 12/05/2015  . Hypertension   . MYOCARDIAL INFARCTION 02/03/2008   Qualifier: History of  By: Doy Mince  LPN, [place]    . Myocardial infarction ([place])   . Obstructive sleep apnea 02/03/2008   NPSG; 02/04/2008-mild OSA-RDI 5.1 per hour CPAP 11 cm H2O.       . OSA on CPAP   . Plantar fasciitis, bilateral 12/13/2012   Injection Right heel Feb '14   . Preoperative clearance 04/26/2018  . Syncope 06/05/2019  . Typical atrial flutter ([place])   . Unstable angina ([place]) 12/02/2015    Past Surgical History:  Procedure Laterality Date  . ATRIAL FIBRILLATION ABLATION N/A 08/01/2019   Procedure: ATRIAL FIBRILLATION ABLATION;  Surgeon: Thompson Grayer, MD;  Location: [facility];  Service: Cardiovascular;  Laterality: N/A;  . CARDIAC CATHETERIZATION  06/01/2007    EF of 65% -- Nonobstructive CAD with 40% ostial stenosis in the LAD, no significant obstruction in the circumflex artery, 20% narrowing within the stent in the proximal to mid right coronary and normal LV function  . CARDIAC CATHETERIZATION  02/02/2006   EF of 50%  . CARDIAC CATHETERIZATION  01/19/2002   EF of 60%  . CARDIAC CATHETERIZATION N/A 12/03/2015   Procedure: Left Heart Cath and Coronary Angiography;  Surgeon: Burnell Blanks, MD;  Location: [facility];  Service: Cardiovascular;  Laterality: N/A;  . CAROTID ENDARTERECTOMY  06/26/2002   right  . CORONARY ANGIOPLASTY WITH STENT PLACEMENT  03/08/2000   Bare-metal stent in RCA, in [place]  . KNEE ARTHROSCOPY     right  . MOLE REMOVAL     Prior moles removed    Family History  Problem Relation Age of Onset  . Heart failure Father   . Heart disease Father   . Peripheral vascular disease Mother        with pacemaker, and had a carotid endarterectomy  . Heart disease Mother   . Hypertension Brother   . Hypertension Brother     Social History   Tobacco Use  . Smoking status: Former Smoker    Packs/day: 1.00    Years: 30.00    Pack years: 30.00    Types: Cigarettes    Quit date: 10/19/2000    Years since quitting: 19.9  . Smokeless tobacco: Current User     Types: Snuff  . Tobacco comment: Dips snuff.   Substance Use Topics  . Alcohol use: Yes    Alcohol/week: 1.0 standard drink    Types: 1 Cans of beer per week    Comment: Occasional beer    Subjective:  Presents for yearly CPE; doing very well today with no acute concerns; has continued regular follow up with his cardiologist; up to date on dental and vision exams;   Review of Systems  Constitutional: Negative.   HENT: Negative.   Eyes: Negative.   Respiratory: Negative.   Cardiovascular: Negative.   Gastrointestinal: Negative.   Genitourinary: Negative.  Musculoskeletal: Positive for joint pain.  Skin: Negative.   Neurological: Negative.   Endo/Heme/Allergies: Negative.   Psychiatric/Behavioral: Negative.      Objective:  Vitals:   09/09/20 1104  BP: 140/76  Pulse: 77  Temp: 98.4 F (36.9 C)  TempSrc: Oral  SpO2: 96%  Weight: 203 lb (92.1 kg)  Height: 6\' 1"  (1.854 m)    General: Well developed, well nourished, in no acute distress  Skin : Warm and dry.  Head: Normocephalic and atraumatic  Eyes: Sclera and conjunctiva clear; pupils round and reactive to light; extraocular movements intact  Ears: External normal; canals clear; tympanic membranes normal  Oropharynx: Pink, supple. No suspicious lesions  Neck: Supple without thyromegaly, adenopathy  Lungs: Respirations unlabored; clear to auscultation bilaterally without wheeze, rales, rhonchi  CVS exam: normal rate and regular rhythm.  Abdomen: Soft; nontender; nondistended; normoactive bowel sounds; no masses or hepatosplenomegaly  Musculoskeletal: No deformities; no active joint inflammation  Extremities: No edema, cyanosis, clubbing  Vessels: Symmetric bilaterally  Neurologic: Alert and oriented; speech intact; face symmetrical; moves all extremities well; CNII-XII intact without focal deficit   Assessment:  1. PE (physical exam), annual   2. Essential hypertension   3. Encounter for screening colonoscopy    4. Coronary artery disease involving native coronary artery of native heart with angina pectoris with documented spasm ([place])   5. Prostate cancer screening     Plan:  Age appropriate preventive healthcare needs addressed; encouraged regular eye doctor and dental exams; encouraged regular exercise; will update labs and refills as needed today; follow-up to be determined; Referral for colonoscopy updated; Patient is planning to get his 2nd COVID shot soon- risks and benefits discussed with his previous history of COVID; he will plan to get pneumonia shot at later date;  This visit occurred during the SARS-CoV-2 public health emergency.  Safety protocols were in place, including screening questions prior to the visit, additional usage of staff PPE, and extensive cleaning of exam room while observing appropriate contact time as indicated for disinfecting solutions.     No follow-ups on file.  Orders Placed This Encounter  Procedures  . PSA    Standing Status:   Future    Number of Occurrences:   1    Standing Expiration Date:   09/09/2021  . Ambulatory referral to Gastroenterology    Referral Priority:   Routine    Referral Type:   Consultation    Referral Reason:   Specialty Services Required    Number of Visits Requested:   1    Requested Prescriptions   Signed Prescriptions Disp Refills  . ramipril (ALTACE) 10 MG capsule 180 capsule 3    Sig: Take 1 capsule (10 mg total) by mouth 2 (two) times daily.  . finasteride (PROSCAR) 5 MG tablet 90 tablet 3    Sig: Take 1 tablet (5 mg total) by mouth daily.  . rosuvastatin (CRESTOR) 40 MG tablet 90 tablet 3    Sig: Take 1 tablet (40 mg total) by mouth daily.  . potassium chloride (KLOR-CON) 10 MEQ tablet 90 tablet 3    Sig: Take 1 tablet (10 mEq total) by mouth daily.  . metoprolol tartrate (LOPRESSOR) 25 MG tablet 90 tablet 3    Sig: TAKE 1 TABLET BY MOUTH  DAILY AS NEEDED FOR  PALPITATIONS / AFIB.  Marland Kitchen tiZANidine (ZANAFLEX) 2 MG tablet  90 tablet 3    Sig: Take one tablet at bedtime as needed for pain, if tolerating, ok to  take twice daily as needed

## 2020-09-09 NOTE — Telephone Encounter (Signed)
Patients daughter calling to let us know that he had been seen before at [facility] before we refer him to somewhere else.

## 2020-09-10 ENCOUNTER — Encounter: Payer: Self-pay | Admitting: Family

## 2020-09-19 ENCOUNTER — Other Ambulatory Visit: Payer: Self-pay | Admitting: Podiatry

## 2020-09-19 DIAGNOSIS — M24173 Other articular cartilage disorders, unspecified ankle: Secondary | ICD-10-CM

## 2020-09-19 DIAGNOSIS — M249 Joint derangement, unspecified: Secondary | ICD-10-CM

## 2020-09-19 NOTE — Progress Notes (Signed)
Re-sent referral order to Dr. Doran Durand.

## 2020-09-20 ENCOUNTER — Encounter: Payer: Self-pay | Admitting: Family

## 2020-10-02 ENCOUNTER — Other Ambulatory Visit: Payer: Self-pay | Admitting: Cardiovascular Disease

## 2020-10-14 DIAGNOSIS — M216X2 Other acquired deformities of left foot: Secondary | ICD-10-CM | POA: Diagnosis not present

## 2020-10-14 DIAGNOSIS — M19072 Primary osteoarthritis, left ankle and foot: Secondary | ICD-10-CM | POA: Diagnosis not present

## 2020-10-14 IMAGING — DX CHEST - 2 VIEW
2 series · 2 of 2 positions shown · non-contrast
Comparison: Single-view of the chest 05/30/2018.

CLINICAL DATA: Syncope x2 over the past week. History of atrial
fibrillation.

EXAM:
CHEST - 2 VIEW

[chest pa]
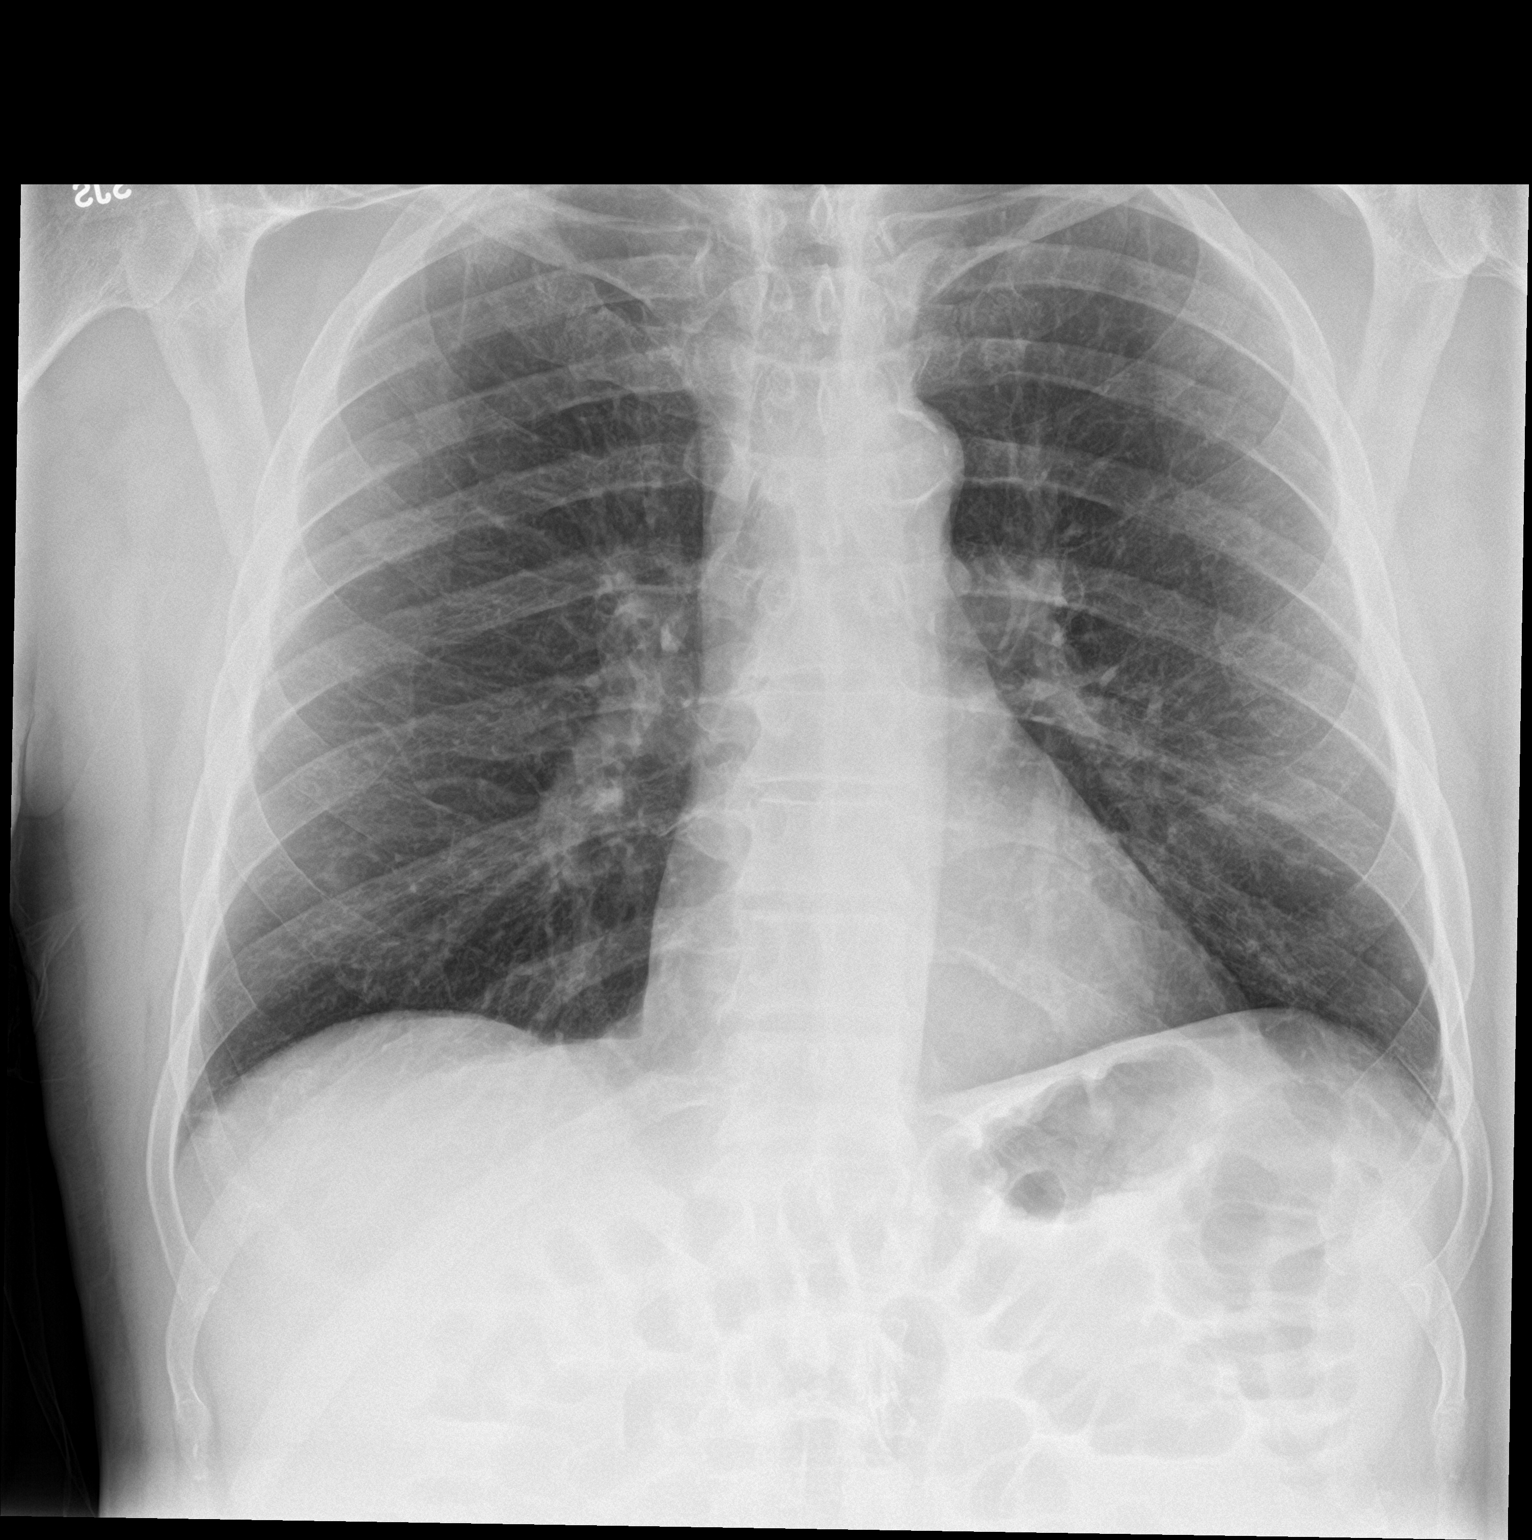

[chest lat]
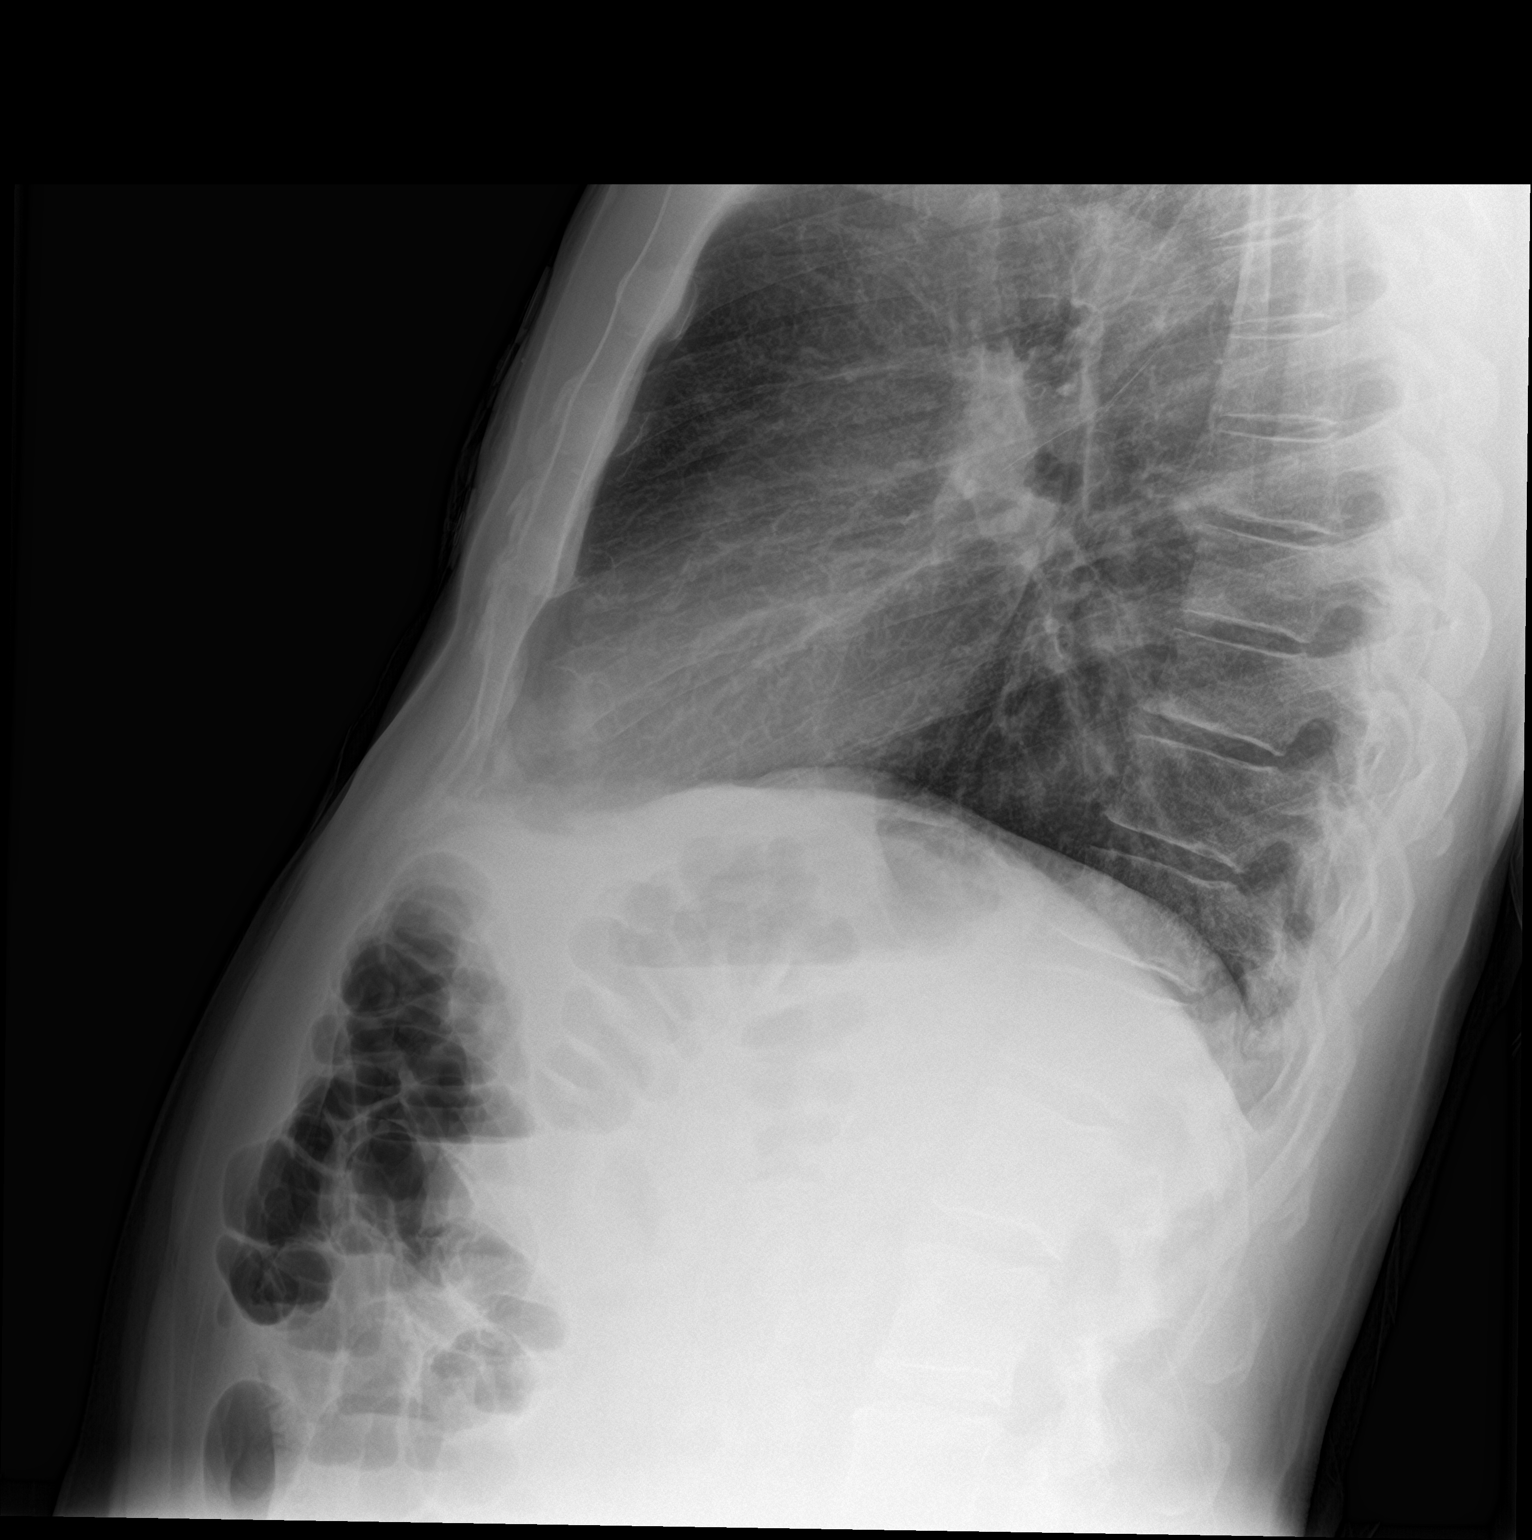

[2 of 2 positions shown; findings below may reference images not displayed]

FINDINGS: Lungs clear. Heart size normal. Aortic atherosclerosis noted. No
pneumothorax or pleural fluid. No acute or focal bony abnormality.
IMPRESSION: No acute disease.

Atherosclerosis.

## 2020-10-15 ENCOUNTER — Telehealth: Payer: Self-pay | Admitting: *Deleted

## 2020-10-15 NOTE — Telephone Encounter (Signed)
   [facility] Pre-operative Risk Assessment    HEARTCARE STAFF: - Please ensure there is not already an duplicate clearance open for this procedure. - Under Visit Info/Reason for Call, type in Other and utilize the format Clearance MM/DD/YY or Clearance TBD. Do not use dashes or single digits. - If request is for dental extraction, please clarify the # of teeth to be extracted.  Request for surgical clearance:  1. What type of surgery is being performed? Left total ankle arthroplasty with possible PF release and 1st metatarsal DF osteotomy   2. When is this surgery scheduled? TBD   3. What type of clearance is required (medical clearance vs. Pharmacy clearance to hold med vs. Both)? Both  4. Are there any medications that need to be held prior to surgery and how long?Eliquis   5. Practice name and name of physician performing surgery? Dr Wylene Simmer, [facility]   6. What is the office phone number? [phone]   7.   What is the office fax number? [phone] Attn:Kelly Hancock  8.   Anesthesia type (None, local, MAC, general) ? General   [name] L 10/15/2020, 1:58 PM  _________________________________________________________________   (provider comments below)

## 2020-10-15 NOTE — Telephone Encounter (Signed)
Patient with diagnosis of afib on Eliquis for anticoagulation.    Procedure: Left total ankle arthroplasty with possible PF release and 1st metatarsal DF osteotomy   Date of procedure: TBD  CHA2DS2-VASc Score = 3  This indicates a 3.2% annual risk of stroke. The patient's score is based upon: CHF History: No HTN History: Yes Diabetes History: No Stroke History: No Vascular Disease History: Yes Age Score: 1 Gender Score: 0   Underwent afib ablation 07/2019  CrCl 45mL/min Platelet count 152K  Per office protocol, patient can hold Eliquis for 2-3 days prior to procedure.

## 2020-10-16 ENCOUNTER — Other Ambulatory Visit (HOSPITAL_COMMUNITY): Payer: Self-pay | Admitting: Orthopedic Surgery

## 2020-10-16 NOTE — Telephone Encounter (Signed)
   Primary Cardiologist: Verne Carrow, MD  Chart reviewed as part of pre-operative protocol coverage. Patient was contacted 10/16/2020 in reference to pre-operative risk assessment for pending surgery as outlined below.  [name] was last seen on 08/13/20 by Herma Carson, PA.  Since that day, [name] has done well.  Therefore, based on ACC/AHA guidelines, the patient would be at acceptable risk for the planned procedure without further cardiovascular testing.   Per pharmacy review, CHADS2VASC of 3 with CrCl 78 mL/min and platelet count 152K. Per office protocol, may hold Eliquis for 2-3 days prior to the procedure. Educated patient's daughter to ask surgeon when to resume post-procedure.   The patient was advised that if he develops new symptoms prior to surgery to contact our office to arrange for a follow-up visit, and he verbalized understanding.  I will route this recommendation to the requesting party via Epic fax function and remove from pre-op pool. Please call with questions.  Alver Sorrow, NP 10/16/2020, 9:29 AM

## 2020-10-16 NOTE — Telephone Encounter (Signed)
Left VM requesting call back 10/16/20 at 8:36am.   Alver Sorrow, NP

## 2020-10-17 DIAGNOSIS — M19072 Primary osteoarthritis, left ankle and foot: Secondary | ICD-10-CM | POA: Diagnosis not present

## 2020-10-20 IMAGING — CT CT ABDOMEN AND PELVIS WITH CONTRAST
2 of 5 series · 15 of 46 positions shown, 17 images · IV contrast (omnipaque)
Comparison: 12/13/2018

CLINICAL DATA: Abdominal pain, gastroenteritis or colitis, [id number]
positive

EXAM:
CT ABDOMEN AND PELVIS WITH CONTRAST
TECHNIQUE: Multidetector CT imaging of the abdomen and pelvis was performed
using the standard protocol following bolus administration of
intravenous contrast.
CONTRAST:  80mL OMNIPAQUE IOHEXOL 300 MG/ML SOLN, additional oral
enteric contrast

[Series 3: abd/ pelvis 5.0 i30f 2 · axial · 0.81mm/px · z∈[+790,+1245]mm · 12 of 105 slices shown, 14 images]
[im 7/105  soft-tissue]
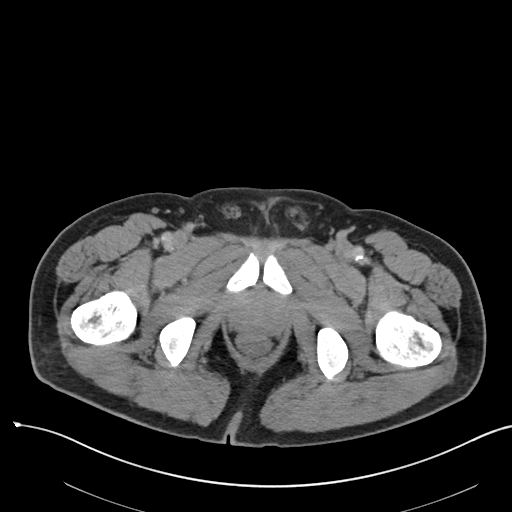
[im 7/105  bone]
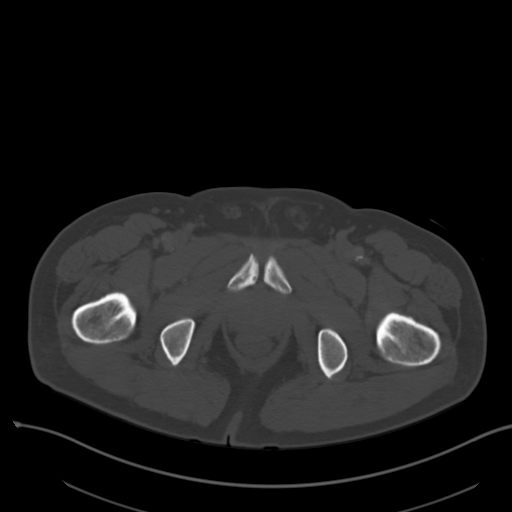
[im 14/105  soft-tissue]
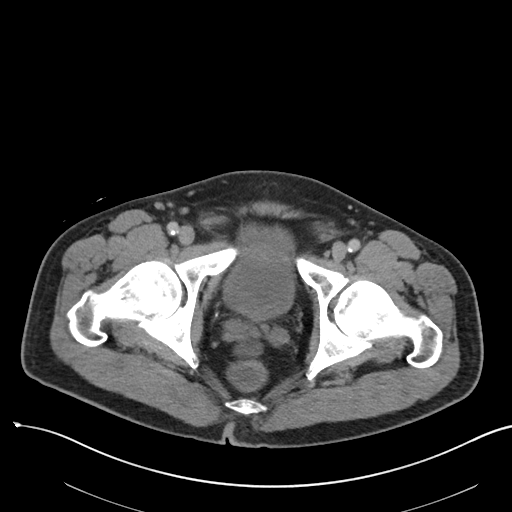
[im 27/105  soft-tissue]
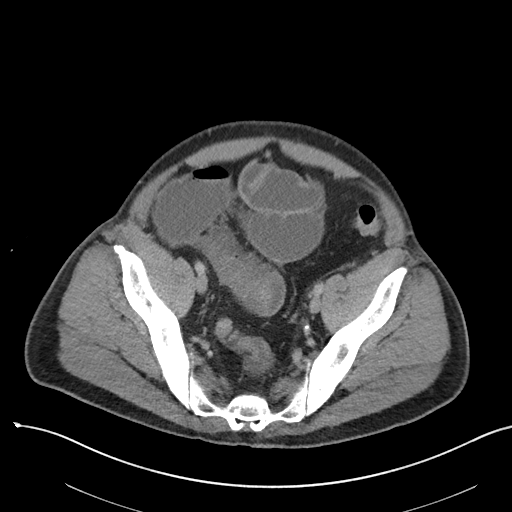
[im 33/105  soft-tissue]
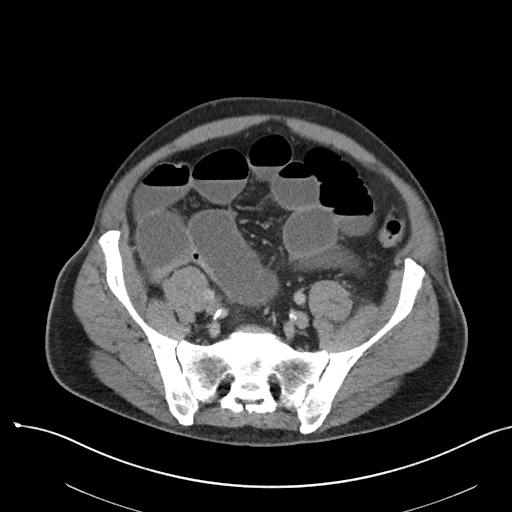
[im 40/105  soft-tissue]
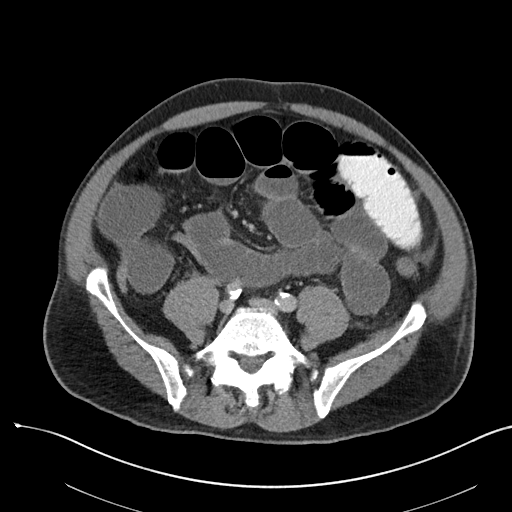
[im 46/105  soft-tissue]
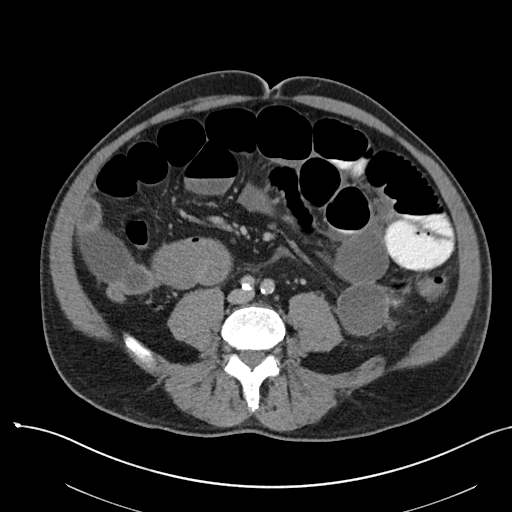
[im 59/105  soft-tissue]
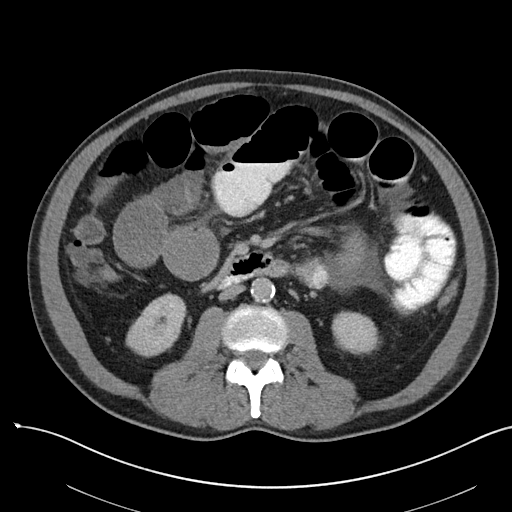
[im 66/105  soft-tissue]
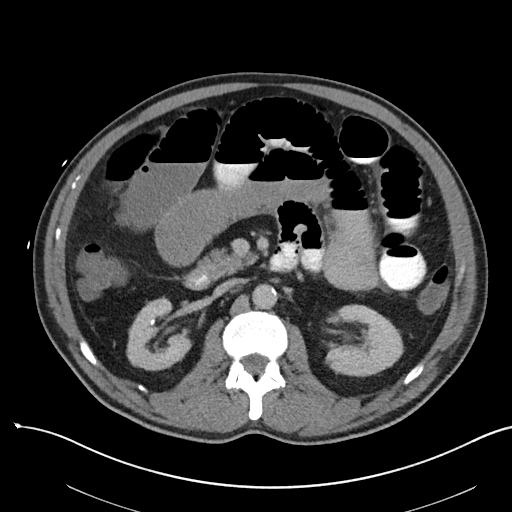
[im 72/105  soft-tissue]
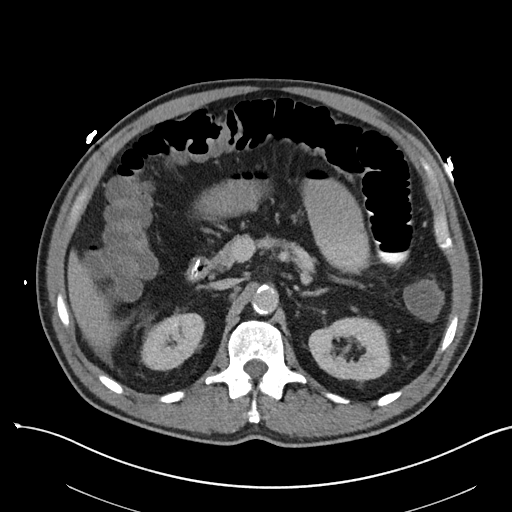
[im 72/105  bone]
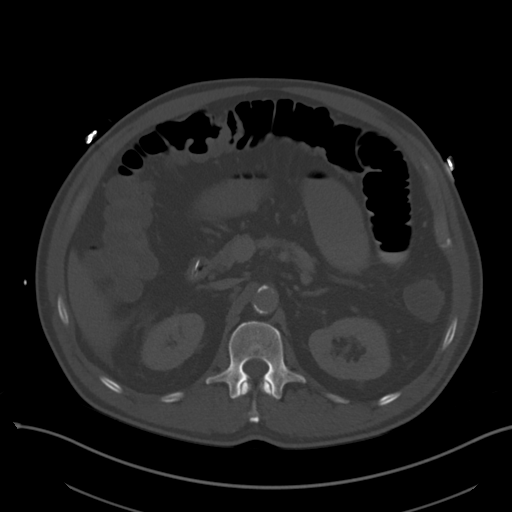
[im 79/105  soft-tissue]
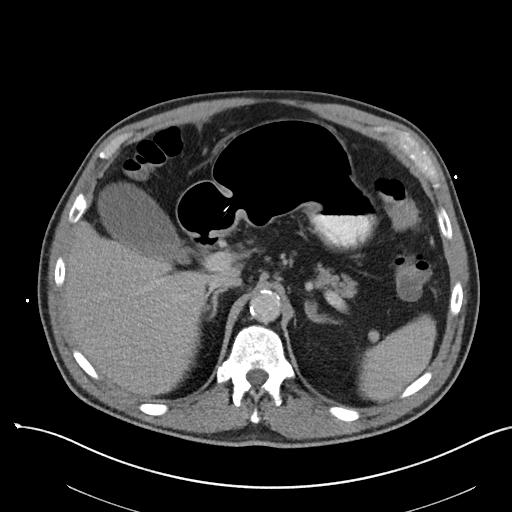
[im 92/105  soft-tissue]
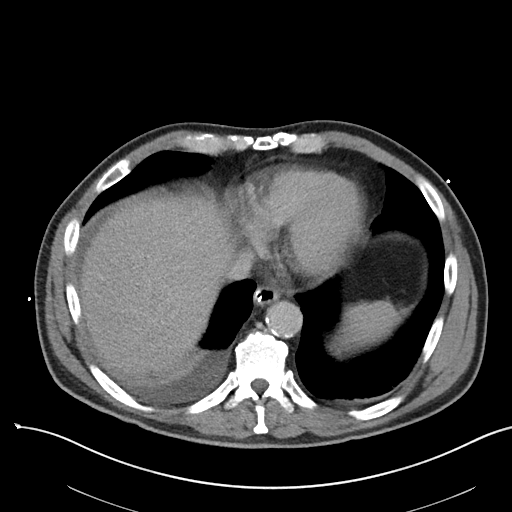
[im 98/105  soft-tissue]
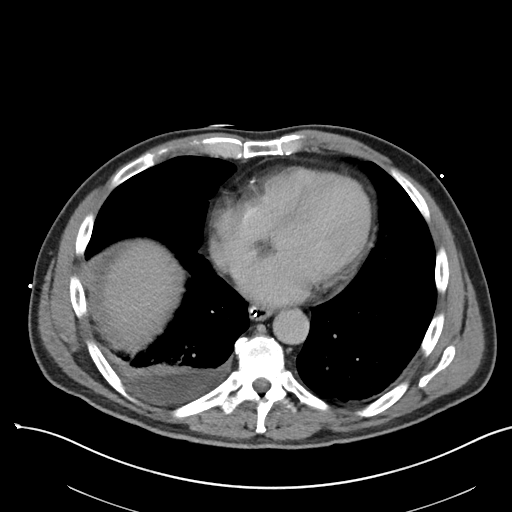

[Series 6: coronal soft tissue · coronal · 0.81mm/px · 3 of 101 slices shown]
[im 34/101  soft-tissue]
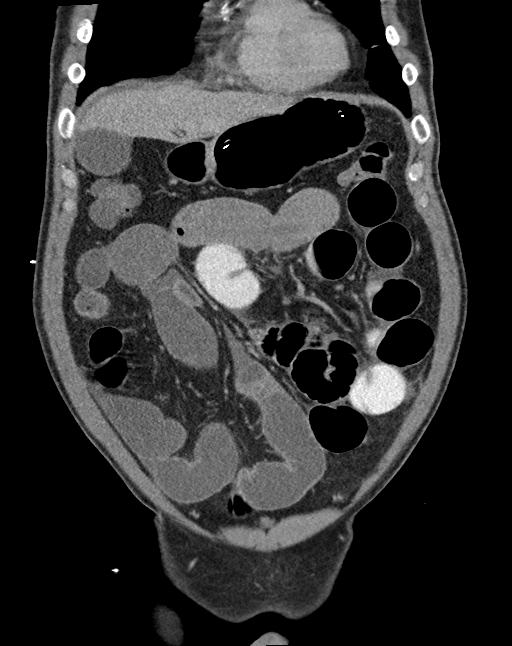
[im 45/101  soft-tissue]
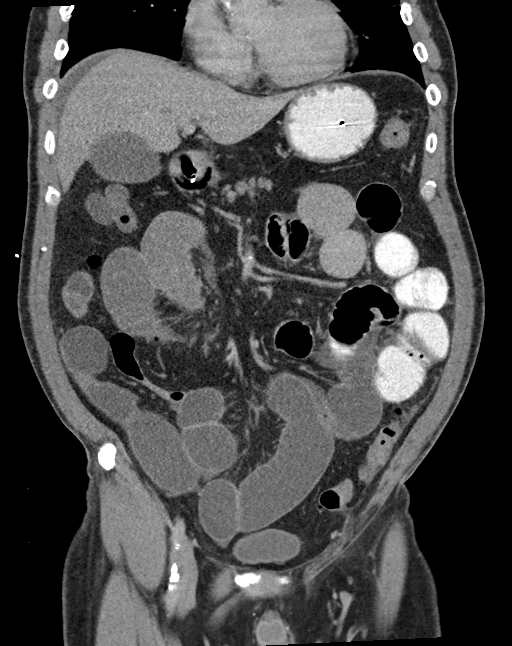
[im 56/101  soft-tissue]
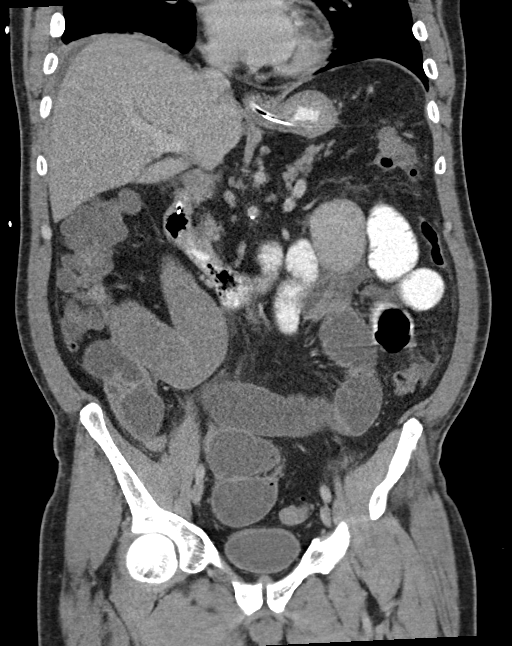

[15 of 46 positions shown; findings below may reference images not displayed]

FINDINGS: Lower chest: Small right pleural effusion and associated
atelectasis. Scattered ground-glass opacities primarily in the
included left lower lung. Coronary artery calcifications.

Hepatobiliary: No solid liver abnormality is seen. No gallstones,
gallbladder wall thickening, or biliary dilatation.

Pancreas: Unremarkable. No pancreatic ductal dilatation or
surrounding inflammatory changes.

Spleen: Normal in size without significant abnormality.

Adrenals/Urinary Tract: Adrenal glands are unremarkable. Kidneys are
normal, without renal calculi, solid lesion, or hydronephrosis.
Bladder is unremarkable.

Stomach/Bowel: Stomach is within normal limits. Appendix appears
normal. The small bowel is diffusely distended to the terminal
ileum, largest loops measuring 4.3 cm, the final loop of terminal
ileum decompressed with fluid in the colon to the rectum. Sigmoid
diverticulosis. Esophagogastric tube is positioned with tip in the
duodenum.

Vascular/Lymphatic: Aortic atherosclerosis. No enlarged abdominal or
pelvic lymph nodes.

Reproductive: No mass or other significant abnormality.

Other: No abdominal wall hernia or abnormality. Small volume
ascites.

Musculoskeletal: No acute or significant osseous findings.
IMPRESSION: 1. The small bowel is diffusely distended to the terminal ileum,
largest loops measuring 4.3 cm, the final loop of terminal ileum
decompressed with fluid in the colon to the rectum. Findings favor
ileus without definite findings to suggest obstructed transition
point of the terminal ileum. Consider follow-up radiographs to
observe for transit of oral contrast to the colon.

2.  Small volume ascites, likely reactive.

3. Esophagogastric tube is positioned with tip in the duodenum.
Consider slight retraction for intragastric positioning.

## 2020-10-23 ENCOUNTER — Encounter: Payer: Self-pay | Admitting: Family

## 2020-10-23 ENCOUNTER — Other Ambulatory Visit: Payer: Self-pay

## 2020-10-23 DIAGNOSIS — Z1211 Encounter for screening for malignant neoplasm of colon: Secondary | ICD-10-CM

## 2020-10-31 DIAGNOSIS — Z1211 Encounter for screening for malignant neoplasm of colon: Secondary | ICD-10-CM | POA: Diagnosis not present

## 2020-10-31 LAB — COLOGUARD
Cologuard: NEGATIVE
Cologuard: NEGATIVE

## 2020-11-11 LAB — COLOGUARD: COLOGUARD: NEGATIVE

## 2020-11-12 ENCOUNTER — Encounter: Payer: Self-pay | Admitting: Family

## 2020-11-21 ENCOUNTER — Encounter (HOSPITAL_BASED_OUTPATIENT_CLINIC_OR_DEPARTMENT_OTHER): Payer: Self-pay | Admitting: Orthopedic Surgery

## 2020-11-21 ENCOUNTER — Other Ambulatory Visit: Payer: Self-pay

## 2020-11-25 ENCOUNTER — Other Ambulatory Visit (HOSPITAL_COMMUNITY)
Admission: RE | Admit: 2020-11-25 | Discharge: 2020-11-25 | Disposition: A | Discharge status: Home / Self Care | Payer: Medicare Other | Source: Ambulatory Visit | Attending: Orthopedic Surgery | Admitting: Orthopedic Surgery

## 2020-11-25 ENCOUNTER — Encounter (HOSPITAL_BASED_OUTPATIENT_CLINIC_OR_DEPARTMENT_OTHER)
Admission: RE | Admit: 2020-11-25 | Discharge: 2020-11-25 | Disposition: A | Discharge status: Home / Self Care | Payer: Medicare Other | Source: Ambulatory Visit | Attending: Orthopedic Surgery | Admitting: Orthopedic Surgery

## 2020-11-25 DIAGNOSIS — Z01812 Encounter for preprocedural laboratory examination: Secondary | ICD-10-CM | POA: Insufficient documentation

## 2020-11-25 DIAGNOSIS — Z20822 Contact with and (suspected) exposure to covid-19: Secondary | ICD-10-CM | POA: Insufficient documentation

## 2020-11-25 LAB — BASIC METABOLIC PANEL
Anion gap: 10 (ref 5–15)
BUN: 13 mg/dL (ref 8–23)
CO2: 26 mmol/L (ref 22–32)
Calcium: 9.5 mg/dL (ref 8.9–10.3)
Chloride: 103 mmol/L (ref 98–111)
Creatinine, Ser: 1.13 mg/dL (ref 0.61–1.24)
GFR, Estimated: >60 mL/min (ref >60)
Glucose, Bld: 123 mg/dL — ABNORMAL HIGH (ref 70–99)
Potassium: 4.4 mmol/L (ref 3.5–5.1)
Sodium: 139 mmol/L (ref 135–145)

## 2020-11-25 LAB — SURGICAL PCR SCREEN
MRSA, PCR: NEGATIVE
Staphylococcus aureus: NEGATIVE

## 2020-11-25 LAB — SARS CORONAVIRUS 2 (TAT 6-24 HRS): SARS Coronavirus 2: NEGATIVE

## 2020-11-25 NOTE — Progress Notes (Signed)

## 2020-11-27 NOTE — Anesthesia Preprocedure Evaluation (Addendum)
Anesthesia Evaluation  Patient identified by MRN, date of birth, ID band Patient awake    Reviewed: Allergy & Precautions, NPO status , Patient's Chart, lab work & pertinent test results  Airway Mallampati: II       Dental  (+) Upper Dentures, Lower Dentures   Pulmonary sleep apnea and Continuous Positive Airway Pressure Ventilation , former smoker,    Pulmonary exam normal        Cardiovascular hypertension, + Cardiac Stents  Normal cardiovascular exam     Neuro/Psych negative psych ROS   GI/Hepatic GERD  Medicated,  Endo/Other  negative endocrine ROS  Renal/GU   negative genitourinary   Musculoskeletal   Abdominal Normal abdominal exam  (+)   Peds  Hematology negative hematology ROS (+)   Anesthesia Other Findings   Reproductive/Obstetrics                            Anesthesia Physical Anesthesia Plan  ASA: III  Anesthesia Plan: General   Post-op Pain Management:  Regional for Post-op pain   Induction: Intravenous  PONV Risk Score and Plan: 2 and Ondansetron, Dexamethasone and Midazolam  Airway Management Planned: LMA  Additional Equipment: None  Intra-op Plan:   Post-operative Plan: Extubation in OR  Informed Consent: I have reviewed the patients History and Physical, chart, labs and discussed the procedure including the risks, benefits and alternatives for the proposed anesthesia with the patient or authorized representative who has indicated his/her understanding and acceptance.     Dental advisory given  Plan Discussed with: CRNA  Anesthesia Plan Comments:        Anesthesia Quick Evaluation

## 2020-11-28 ENCOUNTER — Ambulatory Visit (HOSPITAL_BASED_OUTPATIENT_CLINIC_OR_DEPARTMENT_OTHER): Payer: Medicare Other | Admitting: Anesthesiology

## 2020-11-28 ENCOUNTER — Ambulatory Visit (HOSPITAL_COMMUNITY): Payer: Medicare Other

## 2020-11-28 ENCOUNTER — Ambulatory Visit (HOSPITAL_BASED_OUTPATIENT_CLINIC_OR_DEPARTMENT_OTHER)
Admission: RE | Admit: 2020-11-28 | Discharge: 2020-11-28 | Disposition: A | Discharge status: Home / Self Care | Payer: Medicare Other | Attending: Orthopedic Surgery | Admitting: Orthopedic Surgery

## 2020-11-28 ENCOUNTER — Encounter (HOSPITAL_BASED_OUTPATIENT_CLINIC_OR_DEPARTMENT_OTHER)
Admission: RE | Disposition: A | Discharge status: Home / Self Care | Payer: Self-pay | Source: Home / Self Care | Attending: Orthopedic Surgery

## 2020-11-28 ENCOUNTER — Encounter (HOSPITAL_BASED_OUTPATIENT_CLINIC_OR_DEPARTMENT_OTHER): Payer: Self-pay | Admitting: Orthopedic Surgery

## 2020-11-28 ENCOUNTER — Other Ambulatory Visit: Payer: Self-pay

## 2020-11-28 DIAGNOSIS — Z7901 Long term (current) use of anticoagulants: Secondary | ICD-10-CM | POA: Diagnosis not present

## 2020-11-28 DIAGNOSIS — M19072 Primary osteoarthritis, left ankle and foot: Secondary | ICD-10-CM | POA: Diagnosis not present

## 2020-11-28 DIAGNOSIS — Z79899 Other long term (current) drug therapy: Secondary | ICD-10-CM | POA: Insufficient documentation

## 2020-11-28 DIAGNOSIS — M25772 Osteophyte, left ankle: Secondary | ICD-10-CM | POA: Insufficient documentation

## 2020-11-28 DIAGNOSIS — Z8616 Personal history of COVID-19: Secondary | ICD-10-CM | POA: Insufficient documentation

## 2020-11-28 DIAGNOSIS — Q6612 Congenital talipes calcaneovarus, left foot: Secondary | ICD-10-CM | POA: Insufficient documentation

## 2020-11-28 DIAGNOSIS — Z888 Allergy status to other drugs, medicaments and biological substances status: Secondary | ICD-10-CM | POA: Diagnosis not present

## 2020-11-28 DIAGNOSIS — Z87891 Personal history of nicotine dependence: Secondary | ICD-10-CM | POA: Diagnosis not present

## 2020-11-28 DIAGNOSIS — I252 Old myocardial infarction: Secondary | ICD-10-CM | POA: Insufficient documentation

## 2020-11-28 DIAGNOSIS — G8918 Other acute postprocedural pain: Secondary | ICD-10-CM | POA: Diagnosis not present

## 2020-11-28 DIAGNOSIS — M722 Plantar fascial fibromatosis: Secondary | ICD-10-CM | POA: Diagnosis not present

## 2020-11-28 DIAGNOSIS — I4891 Unspecified atrial fibrillation: Secondary | ICD-10-CM | POA: Diagnosis not present

## 2020-11-28 DIAGNOSIS — Z8249 Family history of ischemic heart disease and other diseases of the circulatory system: Secondary | ICD-10-CM | POA: Diagnosis not present

## 2020-11-28 DIAGNOSIS — M6702 Short Achilles tendon (acquired), left ankle: Secondary | ICD-10-CM | POA: Insufficient documentation

## 2020-11-28 DIAGNOSIS — Q6672 Congenital pes cavus, left foot: Secondary | ICD-10-CM | POA: Diagnosis not present

## 2020-11-28 DIAGNOSIS — I25118 Atherosclerotic heart disease of native coronary artery with other forms of angina pectoris: Secondary | ICD-10-CM | POA: Diagnosis not present

## 2020-11-28 DIAGNOSIS — Z471 Aftercare following joint replacement surgery: Secondary | ICD-10-CM | POA: Diagnosis not present

## 2020-11-28 DIAGNOSIS — Z419 Encounter for procedure for purposes other than remedying health state, unspecified: Secondary | ICD-10-CM

## 2020-11-28 DIAGNOSIS — Z955 Presence of coronary angioplasty implant and graft: Secondary | ICD-10-CM | POA: Insufficient documentation

## 2020-11-28 DIAGNOSIS — I10 Essential (primary) hypertension: Secondary | ICD-10-CM | POA: Diagnosis not present

## 2020-11-28 DIAGNOSIS — I2510 Atherosclerotic heart disease of native coronary artery without angina pectoris: Secondary | ICD-10-CM | POA: Insufficient documentation

## 2020-11-28 DIAGNOSIS — Z96662 Presence of left artificial ankle joint: Secondary | ICD-10-CM | POA: Diagnosis not present

## 2020-11-28 DIAGNOSIS — M216X2 Other acquired deformities of left foot: Secondary | ICD-10-CM | POA: Diagnosis not present

## 2020-11-28 HISTORY — PX: ACHILLES TENDON LENGTHENING: SHX6455

## 2020-11-28 HISTORY — PX: METATARSAL OSTEOTOMY: SHX1641

## 2020-11-28 HISTORY — PX: TOTAL ANKLE ARTHROPLASTY: SHX811

## 2020-11-28 SURGERY — ARTHROPLASTY, ANKLE, TOTAL
Anesthesia: General | Site: Foot | Laterality: Left

## 2020-11-28 MED ORDER — OXYCODONE HCL 5 MG PO TABS
5.0000 mg | ORAL_TABLET | Freq: Once | ORAL | Status: AC | PRN
  Administered 2020-11-28: 5 mg via ORAL

## 2020-11-28 MED ORDER — LIDOCAINE 2% (20 MG/ML) 5 ML SYRINGE
INTRAMUSCULAR | Status: DC | PRN
  Administered 2020-11-28: 30 mg via INTRAVENOUS

## 2020-11-28 MED ORDER — VANCOMYCIN HCL 500 MG IV SOLR
INTRAVENOUS | Status: DC | PRN
  Administered 2020-11-28: 500 mg via TOPICAL

## 2020-11-28 MED ORDER — PROPOFOL 500 MG/50ML IV EMUL
INTRAVENOUS | Status: DC | PRN
  Administered 2020-11-28: 25 ug/kg/min via INTRAVENOUS

## 2020-11-28 MED ORDER — FENTANYL CITRATE (PF) 100 MCG/2ML IJ SOLN
INTRAMUSCULAR | Status: AC
  Filled 2020-11-28: qty 2

## 2020-11-28 MED ORDER — CEFAZOLIN SODIUM-DEXTROSE 2-4 GM/100ML-% IV SOLN
INTRAVENOUS | Status: AC
  Filled 2020-11-28: qty 100

## 2020-11-28 MED ORDER — CEFAZOLIN SODIUM-DEXTROSE 2-4 GM/100ML-% IV SOLN
2.0000 g | INTRAVENOUS | Status: AC
  Administered 2020-11-28: 2 g via INTRAVENOUS

## 2020-11-28 MED ORDER — ROPIVACAINE HCL 7.5 MG/ML IJ SOLN
INTRAMUSCULAR | Status: DC | PRN
  Administered 2020-11-28 (×4): 5 mL via PERINEURAL

## 2020-11-28 MED ORDER — CLONIDINE HCL (ANALGESIA) 100 MCG/ML EP SOLN
EPIDURAL | Status: DC | PRN
  Administered 2020-11-28 (×2): 100 ug

## 2020-11-28 MED ORDER — ROPIVACAINE HCL 5 MG/ML IJ SOLN
INTRAMUSCULAR | Status: DC | PRN
  Administered 2020-11-28 (×6): 5 mL via PERINEURAL

## 2020-11-28 MED ORDER — FENTANYL CITRATE (PF) 100 MCG/2ML IJ SOLN
25.0000 ug | INTRAMUSCULAR | Status: DC | PRN
  Administered 2020-11-28: 25 ug via INTRAVENOUS

## 2020-11-28 MED ORDER — MIDAZOLAM HCL 2 MG/2ML IJ SOLN
1.0000 mg | Freq: Once | INTRAMUSCULAR | Status: AC
  Administered 2020-11-28: 2 mg via INTRAVENOUS

## 2020-11-28 MED ORDER — SENNA 8.6 MG PO TABS: 2.0000 | ORAL_TABLET | Freq: Two times a day (BID) | ORAL | 0 refills | Status: DC

## 2020-11-28 MED ORDER — ACETAMINOPHEN 160 MG/5ML PO SOLN: 325.0000 mg | ORAL | Status: DC | PRN

## 2020-11-28 MED ORDER — DEXAMETHASONE SODIUM PHOSPHATE 10 MG/ML IJ SOLN
INTRAMUSCULAR | Status: DC | PRN
  Administered 2020-11-28 (×2): 10 mg

## 2020-11-28 MED ORDER — FENTANYL CITRATE (PF) 100 MCG/2ML IJ SOLN
50.0000 ug | Freq: Once | INTRAMUSCULAR | Status: AC
  Administered 2020-11-28: 100 ug via INTRAVENOUS

## 2020-11-28 MED ORDER — OXYCODONE HCL 5 MG PO TABS: 5.0000 mg | ORAL_TABLET | ORAL | 0 refills | Status: AC | PRN

## 2020-11-28 MED ORDER — MIDAZOLAM HCL 2 MG/2ML IJ SOLN
INTRAMUSCULAR | Status: AC
  Filled 2020-11-28: qty 2

## 2020-11-28 MED ORDER — ONDANSETRON HCL 4 MG/2ML IJ SOLN
INTRAMUSCULAR | Status: DC | PRN
  Administered 2020-11-28: 4 mg via INTRAVENOUS

## 2020-11-28 MED ORDER — DOCUSATE SODIUM 100 MG PO CAPS: 100.0000 mg | ORAL_CAPSULE | Freq: Two times a day (BID) | ORAL | 0 refills | Status: AC

## 2020-11-28 MED ORDER — EPHEDRINE 5 MG/ML INJ
INTRAVENOUS | Status: AC
  Filled 2020-11-28: qty 20

## 2020-11-28 MED ORDER — VANCOMYCIN HCL 500 MG IV SOLR
INTRAVENOUS | Status: AC
  Filled 2020-11-28: qty 500

## 2020-11-28 MED ORDER — PHENYLEPHRINE 40 MCG/ML (10ML) SYRINGE FOR IV PUSH (FOR BLOOD PRESSURE SUPPORT)
PREFILLED_SYRINGE | INTRAVENOUS | Status: AC
  Filled 2020-11-28: qty 20

## 2020-11-28 MED ORDER — EPHEDRINE SULFATE 50 MG/ML IJ SOLN
INTRAMUSCULAR | Status: DC | PRN
  Administered 2020-11-28: 10 mg via INTRAVENOUS

## 2020-11-28 MED ORDER — SODIUM CHLORIDE 0.9 % IV SOLN: INTRAVENOUS | Status: DC

## 2020-11-28 MED ORDER — LIDOCAINE 2% (20 MG/ML) 5 ML SYRINGE
INTRAMUSCULAR | Status: AC
  Filled 2020-11-28: qty 25

## 2020-11-28 MED ORDER — ONDANSETRON HCL 4 MG/2ML IJ SOLN: 4.0000 mg | Freq: Once | INTRAMUSCULAR | Status: DC | PRN

## 2020-11-28 MED ORDER — MEPERIDINE HCL 25 MG/ML IJ SOLN: 6.2500 mg | INTRAMUSCULAR | Status: DC | PRN

## 2020-11-28 MED ORDER — DEXAMETHASONE SODIUM PHOSPHATE 10 MG/ML IJ SOLN
INTRAMUSCULAR | Status: DC | PRN
  Administered 2020-11-28: 4 mg via INTRAVENOUS

## 2020-11-28 MED ORDER — LACTATED RINGERS IV SOLN: INTRAVENOUS | Status: DC

## 2020-11-28 MED ORDER — ACETAMINOPHEN 325 MG PO TABS
325.0000 mg | ORAL_TABLET | ORAL | Status: DC | PRN
  Administered 2020-11-28: 650 mg via ORAL

## 2020-11-28 MED ORDER — FENTANYL CITRATE (PF) 100 MCG/2ML IJ SOLN
INTRAMUSCULAR | Status: DC | PRN
  Administered 2020-11-28: 25 ug via INTRAVENOUS
  Administered 2020-11-28: 50 ug via INTRAVENOUS
  Administered 2020-11-28 (×2): 25 ug via INTRAVENOUS

## 2020-11-28 MED ORDER — ACETAMINOPHEN 325 MG PO TABS
ORAL_TABLET | ORAL | Status: AC
  Filled 2020-11-28: qty 2

## 2020-11-28 MED ORDER — DEXAMETHASONE SODIUM PHOSPHATE 10 MG/ML IJ SOLN
INTRAMUSCULAR | Status: AC
  Filled 2020-11-28: qty 2

## 2020-11-28 MED ORDER — 0.9 % SODIUM CHLORIDE (POUR BTL) OPTIME
TOPICAL | Status: DC | PRN
  Administered 2020-11-28: 700 mL

## 2020-11-28 MED ORDER — PROPOFOL 10 MG/ML IV BOLUS
INTRAVENOUS | Status: DC | PRN
  Administered 2020-11-28: 200 mg via INTRAVENOUS

## 2020-11-28 MED ORDER — OXYCODONE HCL 5 MG/5ML PO SOLN: 5.0000 mg | Freq: Once | ORAL | Status: AC | PRN

## 2020-11-28 MED ORDER — OXYCODONE HCL 5 MG PO TABS
ORAL_TABLET | ORAL | Status: AC
  Filled 2020-11-28: qty 1

## 2020-11-28 MED ORDER — ONDANSETRON HCL 4 MG/2ML IJ SOLN
INTRAMUSCULAR | Status: AC
  Filled 2020-11-28: qty 16

## 2020-11-28 SURGICAL SUPPLY — 91 items
APL PRP STRL LF DISP 70% ISPRP (MISCELLANEOUS) ×4
BANDAGE ESMARK 6X9 LF (GAUZE/BANDAGES/DRESSINGS) IMPLANT
BIT DRILL 2.5X2.75 QC CALB (BIT) ×1 IMPLANT
BLADE AVERAGE 25X9 (BLADE) ×1 IMPLANT
BLADE MICRO SAGITTAL (BLADE) ×1 IMPLANT
BLADE RECIPRO TAPERED (BLADE) ×3 IMPLANT
BLADE SAW OSC ANKLE 8X63X1.19 (BLADE) ×1 IMPLANT
BLADE SAW RECIP ANKLE 8X50X1 (PIN) ×2 IMPLANT
BLADE SURG 15 STRL LF DISP TIS (BLADE) ×6 IMPLANT
BLADE SURG 15 STRL SS (BLADE) ×12
BNDG CMPR 9X6 STRL LF SNTH (GAUZE/BANDAGES/DRESSINGS)
BNDG COHESIVE 4X5 TAN STRL (GAUZE/BANDAGES/DRESSINGS) ×3 IMPLANT
BNDG ELASTIC 4X5.8 VLCR STR LF (GAUZE/BANDAGES/DRESSINGS) ×3 IMPLANT
BNDG ELASTIC 6X5.8 VLCR STR LF (GAUZE/BANDAGES/DRESSINGS) IMPLANT
BNDG ESMARK 6X9 LF (GAUZE/BANDAGES/DRESSINGS)
CHLORAPREP W/TINT 26 (MISCELLANEOUS) ×4 IMPLANT
CLIP LOCKING ANKLE SZ3 (Clip) ×1 IMPLANT
COVER BACK TABLE 60X90IN (DRAPES) ×6 IMPLANT
COVER MAYO STAND STRL (DRAPES) ×2 IMPLANT
COVER WAND RF STERILE (DRAPES) IMPLANT
CUFF TOURN SGL QUICK 34 (TOURNIQUET CUFF) ×3
CUFF TRNQT CYL 34X4.125X (TOURNIQUET CUFF) ×1 IMPLANT
DECANTER SPIKE VIAL GLASS SM (MISCELLANEOUS) IMPLANT
DRAPE C-ARM 42X72 X-RAY (DRAPES) ×3 IMPLANT
DRAPE C-ARMOR (DRAPES) ×3 IMPLANT
DRAPE EXTREMITY T 121X128X90 (DISPOSABLE) ×3 IMPLANT
DRAPE OEC MINIVIEW 54X84 (DRAPES) ×2 IMPLANT
DRAPE U-SHAPE 47X51 STRL (DRAPES) ×3 IMPLANT
DRESSING MEPILEX FLEX 4X4 (GAUZE/BANDAGES/DRESSINGS) ×2 IMPLANT
DRSG MEPILEX BORDER 4X8 (GAUZE/BANDAGES/DRESSINGS) IMPLANT
DRSG MEPILEX FLEX 4X4 (GAUZE/BANDAGES/DRESSINGS) ×3
DRSG MEPITEL 4X7.2 (GAUZE/BANDAGES/DRESSINGS) ×3 IMPLANT
DRSG PAD ABDOMINAL 8X10 ST (GAUZE/BANDAGES/DRESSINGS) ×6 IMPLANT
ELECT REM PT RETURN 9FT ADLT (ELECTROSURGICAL) ×3
ELECTRODE REM PT RTRN 9FT ADLT (ELECTROSURGICAL) ×2 IMPLANT
GAUZE SPONGE 4X4 12PLY STRL (GAUZE/BANDAGES/DRESSINGS) ×3 IMPLANT
GLOVE ECLIPSE 8.0 STRL XLNG CF (GLOVE) ×3 IMPLANT
GLOVE SRG 8 PF TXTR STRL LF DI (GLOVE) ×4 IMPLANT
GLOVE SURG ENC MOIS LTX SZ8 (GLOVE) ×3 IMPLANT
GLOVE SURG SS PI 7.0 STRL IVOR (GLOVE) ×1 IMPLANT
GLOVE SURG SYN 7.0 (GLOVE) ×3 IMPLANT
GLOVE SURG SYN 7.0 PF PI (GLOVE) IMPLANT
GLOVE SURG UNDER POLY LF SZ7 (GLOVE) ×4 IMPLANT
GLOVE SURG UNDER POLY LF SZ8 (GLOVE) ×9
GOWN STRL REUS W/ TWL LRG LVL3 (GOWN DISPOSABLE) ×2 IMPLANT
GOWN STRL REUS W/ TWL XL LVL3 (GOWN DISPOSABLE) ×2 IMPLANT
GOWN STRL REUS W/TWL LRG LVL3 (GOWN DISPOSABLE) ×6
GOWN STRL REUS W/TWL XL LVL3 (GOWN DISPOSABLE) ×6 IMPLANT
IMPL TALAR ANKLE SZ 2 LT (Ankle) IMPLANT
IMPLANT TALAR ANKLE SZ 2 LT (Ankle) ×3 IMPLANT
INSERT TIB FB ANKLE SZ 2X8 LT (Ankle) ×1 IMPLANT
NEEDLE HYPO 22GX1.5 SAFETY (NEEDLE) ×2 IMPLANT
NS IRRIG 1000ML POUR BTL (IV SOLUTION) ×3 IMPLANT
PACK BASIN DAY SURGERY FS (CUSTOM PROCEDURE TRAY) ×3 IMPLANT
PAD CAST 4YDX4 CTTN HI CHSV (CAST SUPPLIES) ×4 IMPLANT
PADDING CAST ABS 4INX4YD NS (CAST SUPPLIES)
PADDING CAST ABS COTTON 4X4 ST (CAST SUPPLIES) IMPLANT
PADDING CAST COTTON 4X4 STRL (CAST SUPPLIES) ×6
PADDING CAST COTTON 6X4 STRL (CAST SUPPLIES) ×3 IMPLANT
PENCIL SMOKE EVACUATOR (MISCELLANEOUS) ×3 IMPLANT
PIN POUCH TALAR VANTAGE 3.5 (PIN) ×1 IMPLANT
PLATE ACE 3.5MM 2HOLE (Plate) ×2 IMPLANT
PLATE TIB FB ANKLE SZ 3 LT (Ankle) ×1 IMPLANT
POUCH SCREW TALAR ANKLE (ORTHOPEDIC DISPOSABLE SUPPLIES) ×2 IMPLANT
SANITIZER HAND PURELL 535ML FO (MISCELLANEOUS) ×3 IMPLANT
SCOTCHCAST PLUS 3X4 WHITE (CAST SUPPLIES) IMPLANT
SCOTCHCAST PLUS 4X4 WHITE (CAST SUPPLIES) ×3 IMPLANT
SCREW CORT FT 32X3.5XNONLOCK (Screw) IMPLANT
SCREW CORTICAL 3.5MM  32MM (Screw) ×3 IMPLANT
SCREW CORTICAL 3.5MM 22MM (Screw) ×1 IMPLANT
SCREW CORTICAL 3.5MM 32MM (Screw) ×2 IMPLANT
SHEET MEDIUM DRAPE 40X70 STRL (DRAPES) ×4 IMPLANT
SLEEVE SCD COMPRESS KNEE MED (MISCELLANEOUS) ×3 IMPLANT
SPONGE LAP 18X18 RF (DISPOSABLE) ×3 IMPLANT
STOCKINETTE 6  STRL (DRAPES) ×3
STOCKINETTE 6 STRL (DRAPES) ×2 IMPLANT
SUCTION FRAZIER HANDLE 10FR (MISCELLANEOUS) ×3
SUCTION TUBE FRAZIER 10FR DISP (MISCELLANEOUS) IMPLANT
SURGILUBE 2OZ TUBE FLIPTOP (MISCELLANEOUS) ×3 IMPLANT
SUT ETHILON 3 0 PS 1 (SUTURE) ×6 IMPLANT
SUT MNCRL AB 3-0 PS2 18 (SUTURE) ×6 IMPLANT
SUT VIC AB 0 CT1 27 (SUTURE) ×3
SUT VIC AB 0 CT1 27XBRD ANBCTR (SUTURE) ×2 IMPLANT
SUT VIC AB 2-0 SH 27 (SUTURE)
SUT VIC AB 2-0 SH 27XBRD (SUTURE) IMPLANT
SYR 20ML LL LF (SYRINGE) IMPLANT
SYR BULB IRRIG 60ML STRL (SYRINGE) ×3 IMPLANT
TOWEL GREEN STERILE FF (TOWEL DISPOSABLE) ×6 IMPLANT
TUBE CONNECTING 20X1/4 (TUBING) ×4 IMPLANT
UNDERPAD 30X36 HEAVY ABSORB (UNDERPADS AND DIAPERS) ×3 IMPLANT
YANKAUER SUCT BULB TIP NO VENT (SUCTIONS) ×4 IMPLANT

## 2020-11-28 NOTE — Anesthesia Postprocedure Evaluation (Signed)
Anesthesia Post Note  Patient: [name]  Procedure(s) Performed: Left total ankle arthroplasty (Left Ankle) Plantar fascial release and First metatarsal dorsiflexion osteotomy (Left Foot) ACHILLES TENDON LENGTHENING (Left Foot)     Patient location during evaluation: PACU Anesthesia Type: General Level of consciousness: sedated Pain management: pain level controlled Vital Signs Assessment: post-procedure vital signs reviewed and stable Respiratory status: spontaneous breathing Cardiovascular status: stable Postop Assessment: no apparent nausea or vomiting Anesthetic complications: no   No complications documented.  Last Vitals:  Vitals:   11/28/20 1015 11/28/20 1030  BP: (!) 97/55 111/69  Pulse: 73 76  Resp: 20 16  Temp:    SpO2: 96% 97%    Last Pain:  Vitals:   11/28/20 1027  TempSrc:   PainSc: 0-No pain                 Huston Foley

## 2020-11-28 NOTE — Transfer of Care (Signed)
Immediate Anesthesia Transfer of Care Note  Patient: [name]  Procedure(s) Performed: Left total ankle arthroplasty (Left Ankle) Plantar fascial release and First metatarsal dorsiflexion osteotomy (Left Foot)  Patient Location: PACU  Anesthesia Type:GA combined with regional for post-op pain  Level of Consciousness: drowsy and patient cooperative  Airway & Oxygen Therapy: Patient Spontanous Breathing and Patient connected to face mask oxygen  Post-op Assessment: Report given to RN and Post -op Vital signs reviewed and stable  Post vital signs: Reviewed and stable  Last Vitals:  Vitals Value Taken Time  BP    Temp    Pulse 79 11/28/20 0958  Resp 20 11/28/20 0958  SpO2 93 % 11/28/20 0958  Vitals shown include unvalidated device data.  Last Pain:  Vitals:   11/28/20 0636  TempSrc: Oral  PainSc: 3       Patients Stated Pain Goal: 4 (73/53/29 9242)  Complications: No complications documented.

## 2020-11-28 NOTE — H&P (Signed)
[name] is an 70 y.o. male.   Chief Complaint: left ankle pain HPI: 70 y/o male with PMH of CAD has a long h/o L ankle pain due to arthritis.  He has failed non op treatment and presents today for L total ankle replacement and possible 1st MT osteotomy and PF release.    Past Medical History:  Diagnosis Date  . Acute left ankle pain 08/05/2018  . AKI (acute kidney injury) ([place]) 06/05/2019  . Atrial fibrillation ([place])   . Atrial fibrillation with RVR ([place]) 06/29/2019  . Bradycardia   . Bradycardia 04/26/2018  . Carotid artery disease ([place])    Right CEA;  dopplers 6/12:  0-39% bilat ICA  . Coronary artery disease    s/p BMS to RCA 2002; [place] 2008: oLAD 40%, pRCA stent ok with 20%, EF 60%); Myoview scan in March 2011 which was negative for ischemia   . Coronary atherosclerosis 12/25/2008   Qualifier: Diagnosis of  By: Fuller Plan MD Lamont Snowball T   . COVID-19 virus infection 06/05/2019  . Dizziness 04/26/2018  . Essential hypertension 10/08/2007   Qualifier: Diagnosis of  By: Danny Lawless CMA, [place]    . GASTROESOPHAGEAL REFLUX DISEASE 10/08/2007   Qualifier: Diagnosis of  By: [place], [place]    . GERD (gastroesophageal reflux disease)   . Hyperlipidemia   . Hypersomnia with sleep apnea 12/05/2015  . Hypertension   . MYOCARDIAL INFARCTION 02/03/2008   Qualifier: History of  By: Doy Mince LPN, [place]    . Myocardial infarction ([place])   . Obstructive sleep apnea 02/03/2008   NPSG; 02/04/2008-mild OSA-RDI 5.1 per hour CPAP 11 cm H2O.       . OSA on CPAP   . Plantar fasciitis, bilateral 12/13/2012   Injection Right heel Feb '14   . Preoperative clearance 04/26/2018  . Syncope 06/05/2019  . Typical atrial flutter ([place])   . Unstable angina ([place]) 12/02/2015    Past Surgical History:  Procedure Laterality Date  . ATRIAL FIBRILLATION ABLATION N/A 08/01/2019   Procedure: ATRIAL FIBRILLATION ABLATION;  Surgeon: Thompson Grayer, MD;  Location: [facility];  Service: Cardiovascular;  Laterality: N/A;   . CARDIAC CATHETERIZATION  06/01/2007    EF of 65% -- Nonobstructive CAD with 40% ostial stenosis in the LAD, no significant obstruction in the circumflex artery, 20% narrowing within the stent in the proximal to mid right coronary and normal LV function  . CARDIAC CATHETERIZATION  02/02/2006   EF of 50%  . CARDIAC CATHETERIZATION  01/19/2002   EF of 60%  . CARDIAC CATHETERIZATION N/A 12/03/2015   Procedure: Left Heart Cath and Coronary Angiography;  Surgeon: Burnell Blanks, MD;  Location: [facility];  Service: Cardiovascular;  Laterality: N/A;  . CAROTID ENDARTERECTOMY  06/26/2002   right  . CORONARY ANGIOPLASTY WITH STENT PLACEMENT  03/08/2000   Bare-metal stent in RCA, in [place]  . KNEE ARTHROSCOPY     right  . MOLE REMOVAL     Prior moles removed    Family History  Problem Relation Age of Onset  . Heart failure Father   . Heart disease Father   . Peripheral vascular disease Mother        with pacemaker, and had a carotid endarterectomy  . Heart disease Mother   . Hypertension Brother   . Hypertension Brother    Social History:  reports that he quit smoking about 20 years ago. His smoking use included cigarettes. He has a 30.00 pack-year smoking history.  His smokeless tobacco use includes snuff. He reports current alcohol use of about 1.0 standard drink of alcohol per week. He reports that he does not use drugs.  Allergies:  Allergies  Allergen Reactions  . Felodipine Swelling    Legs became swollen  . Niacin Other (See Comments)    Made patient feel flushed    Medications Prior to Admission  Medication Sig Dispense Refill  . amLODipine (NORVASC) 10 MG tablet TAKE 1 TABLET BY MOUTH  DAILY 90 tablet 3  . b complex vitamins capsule Take 1 capsule by mouth daily.    Marland Kitchen doxazosin (CARDURA) 2 MG tablet TAKE 1 TABLET BY MOUTH AT  BEDTIME 90 tablet 3  . ezetimibe (ZETIA) 10 MG tablet TAKE 1 TABLET BY MOUTH  DAILY 90 tablet 3  . finasteride (PROSCAR) 5 MG  tablet Take 1 tablet (5 mg total) by mouth daily. 90 tablet 3  . gabapentin (NEURONTIN) 300 MG capsule Take 3 capsules every night 90 capsule 11  . hydrochlorothiazide (HYDRODIURIL) 25 MG tablet Take 1 tablet (25 mg total) by mouth daily. 90 tablet 3  . isosorbide mononitrate (IMDUR) 30 MG 24 hr tablet TAKE 1 TABLET BY MOUTH  DAILY 90 tablet 3  . metoprolol tartrate (LOPRESSOR) 25 MG tablet TAKE 1 TABLET BY MOUTH  DAILY AS NEEDED FOR  PALPITATIONS / AFIB. 90 tablet 3  . pantoprazole (PROTONIX) 40 MG tablet Take 1 tablet (40 mg total) by mouth daily. 90 tablet 3  . potassium chloride (KLOR-CON) 10 MEQ tablet Take 1 tablet (10 mEq total) by mouth daily. 90 tablet 3  . ramipril (ALTACE) 10 MG capsule Take 1 capsule (10 mg total) by mouth 2 (two) times daily. 180 capsule 3  . rosuvastatin (CRESTOR) 40 MG tablet Take 1 tablet (40 mg total) by mouth daily. 90 tablet 3  . tiZANidine (ZANAFLEX) 2 MG tablet Take one tablet at bedtime as needed for pain, if tolerating, ok to take twice daily as needed 90 tablet 3  . ELIQUIS 5 MG TABS tablet TAKE 1 TABLET BY MOUTH  TWICE DAILY 180 tablet 3  . nitroGLYCERIN (NITROSTAT) 0.4 MG SL tablet Place 1 tablet (0.4 mg total) under the tongue every 5 (five) minutes as needed. Call 9-1-1 if need more than 2. 25 tablet 0    No results found for this or any previous visit (from the past 48 hour(s)). No results found.  Review of Systems  No recent f/c/n/v/wt loss  Blood pressure 133/73, pulse 69, temperature 98.1 F (36.7 C), temperature source Oral, resp. rate 16, height 6\' 1"  (1.854 m), weight 93.3 kg, SpO2 99 %. Physical Exam  wn wd male in nad.  A and O x 4.  Normal mood and affect.  EOMI.  resp unlabored.  L ankle with varus malalignment.  Skin healthy and intact.  No lymphadenopathy.  5/5 strength in PF and DF.  Sens to LT dorsally and plantarly at the forefoot.  Assessment/Plan L ankle varus arthritis and cavovarus foot deformity.  To the OR today for surgical  treatment.  The risks and benefits of the alternative treatment options have been discussed in detail.  The patient wishes to proceed with surgery and specifically understands risks of bleeding, infection, nerve damage, blood clots, need for additional surgery, amputation and death.   Wylene Simmer, MD 2020/12/05, 7:17 AM

## 2020-11-28 NOTE — Progress Notes (Signed)
Assisted Dr. Jillyn Hidden with left, ultrasound guided, femoral, popliteal block. Side rails up, monitors on throughout procedure. See vital signs in flow sheet. Tolerated Procedure well.

## 2020-11-28 NOTE — Op Note (Signed)
11/28/2020  10:03 AM  PATIENT:  [name]  70 y.o. male  PRE-OPERATIVE DIAGNOSIS: 1.  Left ankle varus arthritis 2.  Left foot cavovarus deformity  POST-OPERATIVE DIAGNOSIS:  1.  Left ankle varus arthritis 2.  Left foot cavovarus deformity 3.  Short left Achilles tendon  Procedure(s): 1.  Left total ankle arthroplasty 2.  Left percutaneous achilles tendon lengthening 3.  Left foot Plantar fascia release 4.  Left foot First metatarsal dorsiflexion osteotomy 5.  AP and lateral radiographs of the left foot  SURGEON:  Wylene Simmer, MD  ASSISTANT: Mechele Claude, PA-C  ANESTHESIA:   General, regional  EBL:  minimal   TOURNIQUET:   Total Tourniquet Time Documented: Thigh (Left) - 118 minutes Total: Thigh (Left) - 035 minutes  COMPLICATIONS:  None apparent  DISPOSITION:  Extubated, awake and stable to recovery.  INDICATION FOR PROCEDURE: The patient is a 70 year old male with a long history of left ankle pain due to varus ankle arthritis.  He has a cavovarus foot deformity with forefoot driven hindfoot varus.  He also has a tight plantar fascia.  He has failed nonoperative treatment to date including activity modification, oral anti-inflammatories, shoewear modification and bracing.  He presents now for operative treatment of these painful conditions.  The risks and benefits of the alternative treatment options have been discussed in detail.  The patient wishes to proceed with surgery and specifically understands risks of bleeding, infection, nerve damage, blood clots, need for additional surgery, amputation and death.  PROCEDURE IN DETAIL:  After pre operative consent was obtained, and the correct operative site was identified, the patient was brought to the operating room and placed supine on the OR table.  Anesthesia was administered.  Pre-operative antibiotics were administered.  A surgical timeout was taken.  The left lower extremity was prepped and draped in standard sterile  fashion with a tourniquet around the thigh.  The extremity was elevated and the tourniquet was inflated to 250 mmHg.  A longitudinal incision was made over the anterior ankle in line with the extensor houses longus tendon.  Sharp dissection was carried down through the subcutaneous tissues.  The EHL tendon sheath was identified.  It was opened sharply and released proximally and distally.  The anterior joint capsule was incised and elevated medially and laterally.  Anterior osteophytes were removed with a curette and osteotome.  A small incision was made over the tibial tubercle.  A proximal alignment pin was inserted in line with the sword in the medial gutter.  The external cutting jig was applied and tightened securely.  An AP radiograph confirmed alignment with the tibial shaft.  The block was provisionally pinned at the proximal end of the distal component.  Rotation was then set in line with the medial gutter.  Varus valgus was confirmed at the level of the ankle.  A lateral view was obtained and the resection height and slope of the cut set appropriately.  The block was then translated in the medial lateral plane to line up with the medial gutter.  The cutting block was then pinned.  The distal cut was made with the oscillating saw.  The reciprocating saw was then used to make the medial malleolus vertical cut.  Guidepins were removed.  The distal bone was removed.  The posterior malleolus fragment was morselized and removed piecemeal.  The talar cutting guide was then applied and positioned appropriately.  A lateral view confirmed appropriate position of the cutting guide and it was pinned.  The talar cut was made with the oscillating saw.  Bone was removed.  The sizing guide was inserted and a size 3 was confirmed.  The size 2 talar lollipop was inserted and was noted to fit appropriately.  It was pinned after a lateral radiograph confirmed the appropriate position.  The lollipop was removed and the talar  cutting block inserted.  The 4 talar cuts were made and the block removed.  The wound was irrigated copiously to remove all bone debris.  The trial talar implant was inserted.  Fine-tuning cuts were made to fit the trial appropriately.  It was then provisionally pinned.  The size 3 tibial trial was inserted with a trial polyspacer.  Size 3 tibia was confirmed.  The talar trial was drilled.  Both trial implants were then removed after the 3 lug holes were punched.  The wound was then irrigated copiously.  The tibial implant was sprinkled with vancomycin powder.  Bone graft was inserted in the central hole.  The tibial implant was impacted in position.  The talar implant was then impacted in position and a trial polyethylene spacer inserted.  Heel cord was noted to be tight.  The trial polywas removed and replaced with an 8 mm poly which was locked in place.  Medial and lateral ligamentous tension was equal.  Final AP, mortise and lateral radiographs confirmed appropriate position of the implants.  A #15 blade was then used to perform a percutaneous heel cord lengthening.  The ankle would then dorsiflex approximately 20 degrees with the knee extended.  The wounds were irrigated copiously and sprinkled with vancomycin powder.  The anterior joint capsule was repaired with Vicryl.  The extensor retinaculum was repaired with Vicryl.  Subcutaneous tissues were approximated with Monocryl.  The skin incision was closed with nylon.  The proximal pin was removed and that incision closed with nylon after irrigating.  Attention was then turned to the foot.  An incision was made medial to the plantar fascia just distal from the heel pad.  Dissection was carried sharply down through the subcutaneous tissues.  The medial cord of the plantar fascia was dissected and released with a #15 blade.  This allowed appropriate dorsiflexion of the first ray.  This wound was irrigated and closed with nylon.  Attention was turned to the  dorsum of the longitudinal incision was made over the first metatarsal base.  Sharp dissection was carried down through the subcutaneous tissues.  The first TMT joint was identified with a hypodermic needle.  A one third tubular plate was inserted over the base and the proximal hole drilled.  It was secured with a 3.5 mm fully threaded screw from the [employer] small frag set.  The plate was rotated out of the way.  A closing wedge osteotomy was made removing a fragment of bone.  The metatarsal was then dorsiflexed closing the osteotomy site.  The plate was rotated in line with the metatarsal shaft and the distal hole drilled.  The screw was inserted and tightened compressing the osteotomy site appropriately.  The heel was noted to have neutral alignment.  The dorsal wound was then irrigated.  It was sprinkled with vancomycin powder and closed with Monocryl and nylon.  Sterile dressings were applied followed by a well-padded short leg cast.  The tourniquet was released after application of the dressings.  The patient was awakened from anesthesia and transported to the recovery room in stable condition.   FOLLOW UP PLAN: Nonweightbearing on the  left lower extremity for 6 weeks.  Resume Eliquis on postop day 1.  Follow-up in the office in 3 weeks for suture removal and new short leg cast.  Plan weightbearing at 6 weeks postop.   RADIOGRAPHS: AP and lateral radiographs of the left foot are obtained intraoperatively.  These show first metatarsal dorsiflexion osteotomy with appropriate position of all hardware.  No other acute injuries are noted.    Mechele Claude PA-C was present and scrubbed for the duration of the operative case. His assistance was essential in positioning the patient, prepping and draping, gaining and maintaining exposure, performing the operation, closing and dressing the wounds and applying the splint.

## 2020-11-28 NOTE — Anesthesia Procedure Notes (Signed)
Anesthesia Regional Block: Adductor canal block   Pre-Anesthetic Checklist: ,, timeout performed, Correct Patient, Correct Site, Correct Laterality, Correct Procedure, Correct Position, site marked, Risks and benefits discussed,  Surgical consent,  Pre-op evaluation,  At surgeon's request and post-op pain management  Laterality: Lower and Left  Prep: chloraprep       Needles:  Injection technique: Single-shot  Needle Type: Echogenic Stimulator Needle     Needle Length: 9cm  Needle Gauge: 20   Needle insertion depth: 2.5 cm   Additional Needles:   Procedures:,,,, ultrasound used (permanent image in chart),,,,  Narrative:  Start time: 11/28/2020 7:10 AM End time: 11/28/2020 7:18 AM Injection made incrementally with aspirations every 5 mL.  Performed by: Personally  Anesthesiologist: Lyn Hollingshead, MD

## 2020-11-28 NOTE — Discharge Instructions (Addendum)
Wylene Simmer, MD [facility]  Please read the following information regarding your care after surgery.  Medications  You only need a prescription for the narcotic pain medicine (ex. oxycodone, Percocet, Norco).  All of the other medicines listed below are available over the counter. X Aleve 2 pills twice a day for the first 3 days after surgery. X acetominophen (Tylenol) 650 mg every 4-6 hours as you need for minor to moderate pain X oxycodone as prescribed for severe pain  Narcotic pain medicine (ex. oxycodone, Percocet, Vicodin) will cause constipation.  To prevent this problem, take the following medicines while you are taking any pain medicine. X docusate sodium (Colace) 100 mg twice a day X senna (Senokot) 2 tablets twice a day  X To help prevent blood clots, resume your Eliquis the day after your surgery.  You should also get up every hour while you are awake to move around.    Weight Bearing X Do not bear any weight on the operated leg or foot.  Cast / Splint / Dressing X Keep your splint, cast or dressing clean and dry.  Dont put anything (coat hanger, pencil, etc) down inside of it.  If it gets damp, use a hair dryer on the cool setting to dry it.  If it gets soaked, call the office to schedule an appointment for a cast change.   After your dressing, cast or splint is removed; you may shower, but do not soak or scrub the wound.  Allow the water to run over it, and then gently pat it dry.  Swelling It is normal for you to have swelling where you had surgery.  To reduce swelling and pain, keep your toes above your nose for at least 3 days after surgery.  It may be necessary to keep your foot or leg elevated for several weeks.  If it hurts, it should be elevated.  Follow Up Call my office at [phone] when you are discharged from the hospital or surgery center to schedule an appointment to be seen three weeks after surgery.  Call my office at [phone] if you develop a  fever >101.5 F, nausea, vomiting, bleeding from the surgical site or severe pain.      Post Anesthesia Home Care Instructions  Activity: Get plenty of rest for the remainder of the day. A responsible individual must stay with you for 24 hours following the procedure.  For the next 24 hours, DO NOT: -Drive a car -Paediatric nurse -Drink alcoholic beverages -Take any medication unless instructed by your physician -Make any legal decisions or sign important papers.  Meals: Start with liquid foods such as gelatin or soup. Progress to regular foods as tolerated. Avoid greasy, spicy, heavy foods. If nausea and/or vomiting occur, drink only clear liquids until the nausea and/or vomiting subsides. Call your physician if vomiting continues.  Special Instructions/Symptoms: Your throat may feel dry or sore from the anesthesia or the breathing tube placed in your throat during surgery. If this causes discomfort, gargle with warm salt water. The discomfort should disappear within 24 hours.  If you had a scopolamine patch placed behind your ear for the management of post- operative nausea and/or vomiting:  1. The medication in the patch is effective for 72 hours, after which it should be removed.  Wrap patch in a tissue and discard in the trash. Wash hands thoroughly with soap and water. 2. You may remove the patch earlier than 72 hours if you experience unpleasant side effects which may  include dry mouth, dizziness or visual disturbances. 3. Avoid touching the patch. Wash your hands with soap and water after contact with the patch.    Regional Anesthesia Blocks  1. Numbness or the inability to move the "blocked" extremity may last from 3-48 hours after placement. The length of time depends on the medication injected and your individual response to the medication. If the numbness is not going away after 48 hours, call your surgeon.  2. The extremity that is blocked will need to be protected until  the numbness is gone and the  Strength has returned. Because you cannot feel it, you will need to take extra care to avoid injury. Because it may be weak, you may have difficulty moving it or using it. You may not know what position it is in without looking at it while the block is in effect.  3. For blocks in the legs and feet, returning to weight bearing and walking needs to be done carefully. You will need to wait until the numbness is entirely gone and the strength has returned. You should be able to move your leg and foot normally before you try and bear weight or walk. You will need someone to be with you when you first try to ensure you do not fall and possibly risk injury.  4. Bruising and tenderness at the needle site are common side effects and will resolve in a few days.  5. Persistent numbness or new problems with movement should be communicated to the surgeon or the [facility] [phone] [place] [phone]).

## 2020-11-28 NOTE — Anesthesia Procedure Notes (Signed)
Anesthesia Regional Block: Popliteal block   Pre-Anesthetic Checklist: ,, timeout performed, Correct Patient, Correct Site, Correct Laterality, Correct Procedure, Correct Position, site marked, Risks and benefits discussed,  Surgical consent,  Pre-op evaluation,  At surgeon's request and post-op pain management  Laterality: Lower and Left  Prep: chloraprep       Needles:  Injection technique: Single-shot  Needle Type: Echogenic Stimulator Needle     Needle Length: 9cm  Needle Gauge: 20   Needle insertion depth: 1 cm   Additional Needles:   Procedures:,,,, ultrasound used (permanent image in chart),,,,  Narrative:  Start time: 11/28/2020 7:20 AM End time: 11/28/2020 7:25 AM Injection made incrementally with aspirations every 5 mL.  Performed by: Personally  Anesthesiologist: Lyn Hollingshead, MD

## 2020-11-28 NOTE — Anesthesia Procedure Notes (Signed)
Procedure Name: LMA Insertion Date/Time: 11/28/2020 7:34 AM Performed by: Signe Colt, CRNA Pre-anesthesia Checklist: Patient identified, Emergency Drugs available, Suction available and Patient being monitored Patient Re-evaluated:Patient Re-evaluated prior to induction Oxygen Delivery Method: Circle System Utilized Preoxygenation: Pre-oxygenation with 100% oxygen Induction Type: IV induction Ventilation: Mask ventilation without difficulty LMA: LMA inserted LMA Size: 4.0 Number of attempts: 1 Airway Equipment and Method: bite block Placement Confirmation: positive ETCO2 Tube secured with: Tape Dental Injury: Teeth and Oropharynx as per pre-operative assessment

## 2020-11-29 ENCOUNTER — Encounter (HOSPITAL_BASED_OUTPATIENT_CLINIC_OR_DEPARTMENT_OTHER): Payer: Self-pay | Admitting: Orthopedic Surgery

## 2020-12-16 ENCOUNTER — Other Ambulatory Visit: Payer: Self-pay | Admitting: Family

## 2020-12-16 DIAGNOSIS — I10 Essential (primary) hypertension: Secondary | ICD-10-CM

## 2020-12-20 DIAGNOSIS — M19072 Primary osteoarthritis, left ankle and foot: Secondary | ICD-10-CM | POA: Diagnosis not present

## 2020-12-24 ENCOUNTER — Other Ambulatory Visit: Payer: Self-pay | Admitting: Family

## 2021-01-10 DIAGNOSIS — M19072 Primary osteoarthritis, left ankle and foot: Secondary | ICD-10-CM | POA: Diagnosis not present

## 2021-02-01 ENCOUNTER — Other Ambulatory Visit: Payer: Self-pay | Admitting: Family

## 2021-02-10 DIAGNOSIS — M19072 Primary osteoarthritis, left ankle and foot: Secondary | ICD-10-CM | POA: Diagnosis not present

## 2021-02-10 DIAGNOSIS — M25571 Pain in right ankle and joints of right foot: Secondary | ICD-10-CM | POA: Diagnosis not present

## 2021-02-10 DIAGNOSIS — M216X2 Other acquired deformities of left foot: Secondary | ICD-10-CM | POA: Diagnosis not present

## 2021-02-10 DIAGNOSIS — M79671 Pain in right foot: Secondary | ICD-10-CM | POA: Diagnosis not present

## 2021-03-07 ENCOUNTER — Other Ambulatory Visit: Payer: Self-pay | Admitting: Family

## 2021-03-12 DIAGNOSIS — M25571 Pain in right ankle and joints of right foot: Secondary | ICD-10-CM | POA: Diagnosis not present

## 2021-03-12 DIAGNOSIS — M79671 Pain in right foot: Secondary | ICD-10-CM | POA: Diagnosis not present

## 2021-03-13 ENCOUNTER — Other Ambulatory Visit: Payer: Self-pay | Admitting: Family

## 2021-04-02 ENCOUNTER — Encounter: Payer: Self-pay | Admitting: Adult Health

## 2021-04-07 ENCOUNTER — Ambulatory Visit: Payer: Medicare Other | Admitting: Adult Health

## 2021-04-07 ENCOUNTER — Encounter: Payer: Self-pay | Admitting: Adult Health

## 2021-04-07 VITALS — BP 118/68 | HR 68 | Ht 73.0 in | Wt 205.0 lb

## 2021-04-07 DIAGNOSIS — G4733 Obstructive sleep apnea (adult) (pediatric): Secondary | ICD-10-CM

## 2021-04-07 DIAGNOSIS — Z9989 Dependence on other enabling machines and devices: Secondary | ICD-10-CM

## 2021-04-07 NOTE — Progress Notes (Signed)
PATIENT: [name] DOB: [birth year]  REASON FOR VISIT: follow up HISTORY FROM: patient  HISTORY OF PRESENT ILLNESS: [name] is a 70 y.o. male with PMH of CAD s/p PCI, RCEA, GERD, HLD, and HTN who is being followed in this office for OSA on CPAP management.  He was initially evaluated by Dr. Brett Fairy on 07/14/2018 with prior diagnosis of OSA on CPAP and due to recent diagnosis of atrial fibrillation, was referred for ongoing apnea management.  He underwent HST which confirmed mild OSA with AHI at 13 and recommended continuation of auto CPAP.   Today, 04/07/2021, [name] returns for yearly CPAP follow-up visit.  He reports doing well on CPAP tolerating without difficulty.  Prior complaints of frequent mask leak but this has resolved since obtaining a new mask.  He does still have elevated mask leak per report but this is not bothersome to the patient nor noticeable.  Epworth Sleepiness Scale 0.  No concerns at this time.          REVIEW OF SYSTEMS: Out of a complete 14 system review of symptoms, the patient complains only of the following symptoms, apnea and all other reviewed systems are negative.    ALLERGIES: Allergies  Allergen Reactions   Felodipine Swelling    Legs became swollen   Niacin Other (See Comments)    Made patient feel flushed    HOME MEDICATIONS: Outpatient Medications Prior to Visit  Medication Sig Dispense Refill   amLODipine (NORVASC) 10 MG tablet TAKE 1 TABLET BY MOUTH  DAILY 90 tablet 3   b complex vitamins capsule Take 1 capsule by mouth daily.     docusate sodium (COLACE) 100 MG capsule Take 1 capsule (100 mg total) by mouth 2 (two) times daily. While taking narcotic pain medicine. 30 capsule 0   doxazosin (CARDURA) 2 MG tablet TAKE 1 TABLET BY MOUTH AT  BEDTIME 90 tablet 3   ELIQUIS 5 MG TABS tablet TAKE 1 TABLET BY MOUTH  TWICE DAILY 180 tablet 3   ezetimibe (ZETIA) 10 MG tablet TAKE 1 TABLET BY MOUTH  DAILY 90 tablet 3   finasteride (PROSCAR)  5 MG tablet TAKE 1 TABLET(5 MG) BY MOUTH DAILY 90 tablet 3   gabapentin (NEURONTIN) 300 MG capsule Take 3 capsules every night 90 capsule 11   hydrochlorothiazide (HYDRODIURIL) 25 MG tablet Take 1 tablet (25 mg total) by mouth daily. 90 tablet 3   isosorbide mononitrate (IMDUR) 30 MG 24 hr tablet TAKE 1 TABLET BY MOUTH  DAILY 90 tablet 3   metoprolol tartrate (LOPRESSOR) 25 MG tablet TAKE 1 TABLET BY MOUTH  DAILY AS NEEDED FOR  PALPITATIONS AND AFIB 90 tablet 3   nitroGLYCERIN (NITROSTAT) 0.4 MG SL tablet Place 1 tablet (0.4 mg total) under the tongue every 5 (five) minutes as needed. Call 9-1-1 if need more than 2. 25 tablet 0   pantoprazole (PROTONIX) 40 MG tablet TAKE 1 TABLET BY MOUTH  DAILY 90 tablet 3   potassium chloride (KLOR-CON) 10 MEQ tablet Take 1 tablet (10 mEq total) by mouth daily. 90 tablet 3   ramipril (ALTACE) 10 MG capsule TAKE 1 CAPSULE(10 MG) BY MOUTH TWICE DAILY 180 capsule 3   rosuvastatin (CRESTOR) 40 MG tablet Take 1 tablet (40 mg total) by mouth daily. 90 tablet 3   senna (SENOKOT) 8.6 MG TABS tablet Take 2 tablets (17.2 mg total) by mouth 2 (two) times daily. 30 tablet 0   tiZANidine (ZANAFLEX) 2 MG tablet  Take one tablet at bedtime as needed for pain, if tolerating, ok to take twice daily as needed 90 tablet 3   No facility-administered medications prior to visit.    PAST MEDICAL HISTORY: Past Medical History:  Diagnosis Date   Acute left ankle pain 08/05/2018   AKI (acute kidney injury) ([place]) 06/05/2019   Atrial fibrillation (HCC)    Atrial fibrillation with RVR ([place]) 06/29/2019   Bradycardia    Bradycardia 04/26/2018   Carotid artery disease (HCC)    Right CEA;  dopplers 6/12:  0-39% bilat ICA   Coronary artery disease    s/p BMS to RCA 2002; [place] 2008: oLAD 40%, pRCA stent ok with 20%, EF 60%); Myoview scan in March 2011 which was negative for ischemia    Coronary atherosclerosis 12/25/2008   Qualifier: Diagnosis of  By: Fuller Plan MD Lamont Snowball T    COVID-19  virus infection 06/05/2019   Dizziness 04/26/2018   Essential hypertension 10/08/2007   Qualifier: Diagnosis of  By: [place], [place]     GASTROESOPHAGEAL REFLUX DISEASE 10/08/2007   Qualifier: Diagnosis of  By: Danny Lawless CMA, [place]     GERD (gastroesophageal reflux disease)    Hyperlipidemia    Hypersomnia with sleep apnea 12/05/2015   Hypertension    MYOCARDIAL INFARCTION 02/03/2008   Qualifier: History of  By: Doy Mince LPN, Megan     Myocardial infarction [facility])    Obstructive sleep apnea 02/03/2008   NPSG; 02/04/2008-mild OSA-RDI 5.1 per hour CPAP 11 cm H2O.        OSA on CPAP    Plantar fasciitis, bilateral 12/13/2012   Injection Right heel Feb '14    Preoperative clearance 04/26/2018   Syncope 06/05/2019   Typical atrial flutter ([place])    Unstable angina ([place]) 12/02/2015    PAST SURGICAL HISTORY: Past Surgical History:  Procedure Laterality Date   ACHILLES TENDON LENGTHENING Left 11/28/2020   Procedure: ACHILLES TENDON LENGTHENING;  Surgeon: Wylene Simmer, MD;  Location: [place];  Service: Orthopedics;  Laterality: Left;   ATRIAL FIBRILLATION ABLATION N/A 08/01/2019   Procedure: ATRIAL FIBRILLATION ABLATION;  Surgeon: Thompson Grayer, MD;  Location: [place] CV LAB;  Service: Cardiovascular;  Laterality: N/A;   CARDIAC CATHETERIZATION  06/01/2007    EF of 65% -- Nonobstructive CAD with 40% ostial stenosis in the LAD, no significant obstruction in the circumflex artery, 20% narrowing within the stent in the proximal to mid right coronary and normal LV function   CARDIAC CATHETERIZATION  02/02/2006   EF of 50%   CARDIAC CATHETERIZATION  01/19/2002   EF of 60%   CARDIAC CATHETERIZATION N/A 12/03/2015   Procedure: Left Heart Cath and Coronary Angiography;  Surgeon: Burnell Blanks, MD;  Location: [place] [facility];  Service: Cardiovascular;  Laterality: N/A;   CAROTID ENDARTERECTOMY  06/26/2002   right   CORONARY ANGIOPLASTY WITH STENT PLACEMENT  03/08/2000    Bare-metal stent in RCA, in [place] ARTHROSCOPY     right   METATARSAL OSTEOTOMY Left 11/28/2020   Procedure: Plantar fascial release and First metatarsal dorsiflexion osteotomy;  Surgeon: Wylene Simmer, MD;  Location: [place];  Service: Orthopedics;  Laterality: Left;   MOLE REMOVAL     Prior moles removed   TOTAL ANKLE ARTHROPLASTY Left 11/28/2020   Procedure: Left total ankle arthroplasty;  Surgeon: Wylene Simmer, MD;  Location: [place];  Service: Orthopedics;  Laterality: Left;    FAMILY HISTORY: Family History  Problem Relation  Age of Onset   Heart failure Father    Heart disease Father    Peripheral vascular disease Mother        with pacemaker, and had a carotid endarterectomy   Heart disease Mother    Hypertension Brother    Hypertension Brother     SOCIAL HISTORY: Social History   Socioeconomic History   Marital status: Married    Spouse name: Not on file   Number of children: 3   Years of education: 12   Highest education level: High school graduate  Occupational History   Occupation: Architect  Tobacco Use   Smoking status: Former    Packs/day: 1.00    Years: 30.00    Pack years: 30.00    Types: Cigarettes    Quit date: 10/19/2000    Years since quitting: 20.4   Smokeless tobacco: Current    Types: Snuff   Tobacco comments:    Dips snuff.   Vaping Use   Vaping Use: Never used  Substance and Sexual Activity   Alcohol use: Yes    Alcohol/week: 1.0 standard drink    Types: 1 Cans of beer per week    Comment: Occasional beer   Drug use: No   Sexual activity: Yes  Other Topics Concern   Not on file  Social History Narrative   Lives with wife in a one story home.  Has 3 children.  Works in Architect.  Education: high school. Patient is right handed.   Social Determinants of Health   Financial Resource Strain: Not on file  Food Insecurity: Not on file  Transportation Needs: Not on file  Physical  Activity: Not on file  Stress: Not on file  Social Connections: Not on file  Intimate Partner Violence: Not on file      PHYSICAL EXAM  Vitals:   04/07/21 1240  BP: 118/68  Pulse: 68  Weight: 205 lb (93 kg)  Height: 6\' 1"  (1.854 m)   Body mass index is 27.05 kg/m.  Generalized: Well developed, pleasant middle-aged Caucasian male, in no acute distress   Neurological examination  Mentation: Alert oriented to time, place, history taking. Follows all commands speech and language fluent Cranial nerve II-XII: Pupils were equal round reactive to light. Extraocular movements were full, visual field were full on confrontational test. Facial sensation and strength were normal. Uvula tongue midline. Head turning and shoulder shrug  were normal and symmetric. Motor: The motor testing reveals 5 over 5 strength of all 4 extremities. Good symmetric motor tone is noted throughout.  Sensory: Sensory testing is intact to soft touch on all 4 extremities. No evidence of extinction is noted.  Coordination: Cerebellar testing reveals good finger-nose-finger and heel-to-shin bilaterally.  Gait and station: Gait is normal. Tandem gait is normal. Romberg is negative. No drift is seen.  Reflexes: Deep tendon reflexes are symmetric and normal bilaterally.       ASSESSMENT AND PLAN 70 y.o. year old male  has a past medical history of Acute left ankle pain (08/05/2018), AKI (acute kidney injury) ([place]) (06/05/2019), Atrial fibrillation ([place]), Atrial fibrillation with RVR ([place]) (06/29/2019), Bradycardia, Bradycardia (04/26/2018), Carotid artery disease ([place]), Coronary artery disease, Coronary atherosclerosis (12/25/2008), COVID-19 virus infection (06/05/2019), Dizziness (04/26/2018), Essential hypertension (10/08/2007), GASTROESOPHAGEAL REFLUX DISEASE (10/08/2007), GERD (gastroesophageal reflux disease), Hyperlipidemia, Hypersomnia with sleep apnea (12/05/2015), Hypertension, MYOCARDIAL INFARCTION (02/03/2008), Myocardial  infarction [facility]), Obstructive sleep apnea (02/03/2008), OSA on CPAP, Plantar fasciitis, bilateral (12/13/2012), Preoperative clearance (04/26/2018), Syncope (06/05/2019), Typical atrial flutter ([place]), and  Unstable angina ([place]) (12/02/2015). here for follow up regarding use of CPAP for sleep apnea management.    Continue current settings of min pressure 7 and max pressure 13 with EPR level 1 as apnea continues to be well managed.   Elevated mask leak but as not bothersome to patient and apnea well-controlled, will continue to monitor  He also continue to follow with DME company [facility] for any supplies or CPAP related concerns.   Follow-up in 1 year or earlier if needed  CC: [name], FNP Larey Seat, MD  I spent 21 minutes of face-to-face and non-face-to-face time with patient.  This included previsit chart review, CPAP download review, order entry, electronic health record documentation, and patient education regarding importance of ongoing nightly use of CPAP for continued optimal management of apnea and answered all other questions to patient's satisfaction   Frann Rider, [facility]  [facility] [address] [place], [place] [address]  Phone [phone] Fax [phone] Note: This document was prepared with digital dictation and possible smart phrase technology. Any transcriptional errors that result from this process are unintentional.

## 2021-04-07 NOTE — Patient Instructions (Addendum)
Your Plan:  Continue current use of CPAP at current settings        Thank you for coming to see [place] at [facility]. I hope we have been able to provide you high quality care today.  You may receive a patient satisfaction survey over the next few weeks. We would appreciate your feedback and comments so that we may continue to improve ourselves and the health of our patients.

## 2021-04-24 DIAGNOSIS — G4733 Obstructive sleep apnea (adult) (pediatric): Secondary | ICD-10-CM | POA: Diagnosis not present

## 2021-06-11 DIAGNOSIS — M25572 Pain in left ankle and joints of left foot: Secondary | ICD-10-CM | POA: Diagnosis not present

## 2021-06-11 DIAGNOSIS — Z96661 Presence of right artificial ankle joint: Secondary | ICD-10-CM | POA: Diagnosis not present

## 2021-08-07 ENCOUNTER — Other Ambulatory Visit: Payer: Self-pay | Admitting: Family

## 2021-08-07 ENCOUNTER — Other Ambulatory Visit: Payer: Self-pay | Admitting: Cardiovascular Disease

## 2021-08-08 ENCOUNTER — Telehealth: Payer: Self-pay | Admitting: Physician Assistant

## 2021-08-08 DIAGNOSIS — I779 Disorder of arteries and arterioles, unspecified: Secondary | ICD-10-CM

## 2021-08-08 NOTE — Telephone Encounter (Signed)
The patient had to re-schedule his Carotid ultrasound. The order expired 08/13/21 and he will need new orders to have it done the day he is scheduled (08/19/21)

## 2021-08-08 NOTE — Telephone Encounter (Signed)
Order placed for Vas Carotid US per Ermalinda Barrios, PA.

## 2021-08-08 NOTE — Addendum Note (Signed)
Addended by: Thora Lance on: 08/08/2021 01:06 PM   Modules accepted: Orders

## 2021-08-13 ENCOUNTER — Encounter (HOSPITAL_COMMUNITY): Payer: Medicare Other

## 2021-08-19 ENCOUNTER — Ambulatory Visit (HOSPITAL_COMMUNITY)
Admission: RE | Admit: 2021-08-19 | Discharge: 2021-08-19 | Disposition: A | Discharge status: Home / Self Care | Payer: Medicare Other | Source: Ambulatory Visit | Attending: Cardiovascular Disease | Admitting: Cardiovascular Disease

## 2021-08-19 ENCOUNTER — Other Ambulatory Visit: Payer: Self-pay

## 2021-08-19 DIAGNOSIS — I6523 Occlusion and stenosis of bilateral carotid arteries: Secondary | ICD-10-CM | POA: Diagnosis not present

## 2021-08-19 DIAGNOSIS — I779 Disorder of arteries and arterioles, unspecified: Secondary | ICD-10-CM | POA: Diagnosis not present

## 2021-08-21 ENCOUNTER — Other Ambulatory Visit: Payer: Self-pay | Admitting: Family

## 2021-09-04 ENCOUNTER — Other Ambulatory Visit: Payer: Self-pay | Admitting: Cardiovascular Disease

## 2021-09-11 ENCOUNTER — Other Ambulatory Visit: Payer: Self-pay | Admitting: Family

## 2021-09-11 DIAGNOSIS — I10 Essential (primary) hypertension: Secondary | ICD-10-CM

## 2021-09-15 NOTE — Telephone Encounter (Signed)
Please call to see what his plans for continued primary care are. I haven't seen him since 08/2020. He needs an OV with me or he needs to establish with new PCP. I will give 90 day refill on medications but this will have to be last refill.

## 2021-09-19 NOTE — Telephone Encounter (Signed)
Pt has appointment coming up in 09/30/21 @ 3pm.

## 2021-09-26 ENCOUNTER — Ambulatory Visit: Payer: Medicare Other | Admitting: Family

## 2021-09-30 ENCOUNTER — Ambulatory Visit (INDEPENDENT_AMBULATORY_CARE_PROVIDER_SITE_OTHER): Payer: Medicare Other | Admitting: Family

## 2021-09-30 ENCOUNTER — Encounter: Payer: Self-pay | Admitting: Family

## 2021-09-30 VITALS — BP 122/68 | HR 62 | Temp 98.2°F | Ht 73.0 in | Wt 204.4 lb

## 2021-09-30 DIAGNOSIS — Z13220 Encounter for screening for lipoid disorders: Secondary | ICD-10-CM | POA: Diagnosis not present

## 2021-09-30 DIAGNOSIS — I480 Paroxysmal atrial fibrillation: Secondary | ICD-10-CM | POA: Diagnosis not present

## 2021-09-30 DIAGNOSIS — Z1159 Encounter for screening for other viral diseases: Secondary | ICD-10-CM | POA: Diagnosis not present

## 2021-09-30 DIAGNOSIS — I2510 Atherosclerotic heart disease of native coronary artery without angina pectoris: Secondary | ICD-10-CM

## 2021-09-30 DIAGNOSIS — Z0000 Encounter for general adult medical examination without abnormal findings: Secondary | ICD-10-CM | POA: Diagnosis not present

## 2021-09-30 NOTE — Progress Notes (Signed)
[name] is a 70 y.o. male with the following history as recorded in EpicCare:  Patient Active Problem List   Diagnosis Date Noted   Coronary artery disease involving native coronary artery of native heart with angina pectoris with documented spasm ([place]) 09/09/2020   Atrial fibrillation with RVR ([place]) 06/29/2019   Syncope 06/05/2019   COVID-19 virus infection 06/05/2019   AKI (acute kidney injury) ([place]) 06/05/2019   Acute left ankle pain 08/05/2018   Atrial fibrillation ([place]) 05/30/2018   CAD in native artery    Preoperative clearance 04/26/2018   Bradycardia 04/26/2018   Dizziness 04/26/2018   Hypersomnia with sleep apnea 12/05/2015   Unstable angina (HCC) 12/02/2015   Plantar fasciitis, bilateral 12/13/2012   Carotid artery disease ([place]) 11/24/2010   BRADYCARDIA 04/12/2009   Coronary atherosclerosis 12/25/2008   Obstructive sleep apnea 02/03/2008   MYOCARDIAL INFARCTION 02/03/2008   Hyperlipidemia 10/08/2007   Essential hypertension 10/08/2007    Current Outpatient Medications  Medication Sig Dispense Refill   amLODipine (NORVASC) 10 MG tablet TAKE 1 TABLET BY MOUTH  DAILY 90 tablet 3   b complex vitamins capsule Take 1 capsule by mouth daily.     docusate sodium (COLACE) 100 MG capsule Take 1 capsule (100 mg total) by mouth 2 (two) times daily. While taking narcotic pain medicine. 30 capsule 0   doxazosin (CARDURA) 2 MG tablet TAKE 1 TABLET BY MOUTH AT  BEDTIME 90 tablet 3   ELIQUIS 5 MG TABS tablet TAKE 1 TABLET BY MOUTH  TWICE DAILY 180 tablet 3   ezetimibe (ZETIA) 10 MG tablet TAKE 1 TABLET BY MOUTH  DAILY 90 tablet 3   finasteride (PROSCAR) 5 MG tablet TAKE 1 TABLET BY MOUTH  DAILY 90 tablet 0   hydrochlorothiazide (HYDRODIURIL) 25 MG tablet TAKE 1 TABLET BY MOUTH  DAILY 90 tablet 3   isosorbide mononitrate (IMDUR) 30 MG 24 hr tablet Take 1 tablet (30 mg total) by mouth daily. Please make overdue appt with Dr. Angelena Form before anymore refills. Thank you 1st attempt 30  tablet 0   metoprolol tartrate (LOPRESSOR) 25 MG tablet TAKE 1 TABLET BY MOUTH  DAILY AS NEEDED FOR  PALPITATIONS AND AFIB 90 tablet 3   nitroGLYCERIN (NITROSTAT) 0.4 MG SL tablet Place 1 tablet (0.4 mg total) under the tongue every 5 (five) minutes as needed. Call 9-1-1 if need more than 2. 25 tablet 0   pantoprazole (PROTONIX) 40 MG tablet TAKE 1 TABLET BY MOUTH  DAILY 90 tablet 3   potassium chloride (KLOR-CON) 10 MEQ tablet TAKE 1 TABLET BY MOUTH  DAILY 90 tablet 0   ramipril (ALTACE) 10 MG capsule TAKE 1 CAPSULE BY MOUTH  TWICE DAILY 180 capsule 0   rosuvastatin (CRESTOR) 40 MG tablet TAKE 1 TABLET BY MOUTH  DAILY 90 tablet 0   tiZANidine (ZANAFLEX) 2 MG tablet Take one tablet at bedtime as needed for pain, if tolerating, ok to take twice daily as needed 90 tablet 3   gabapentin (NEURONTIN) 300 MG capsule Take 3 capsules every night (Patient not taking: Reported on 09/30/2021) 90 capsule 11   No current facility-administered medications for this visit.    Allergies: Felodipine and Niacin  Past Medical History:  Diagnosis Date   Acute left ankle pain 08/05/2018   AKI (acute kidney injury) ([place]) 06/05/2019   Atrial fibrillation (HCC)    Atrial fibrillation with RVR ([place]) 06/29/2019   Bradycardia    Bradycardia 04/26/2018   Carotid artery disease ([place])  Right CEA;  dopplers 6/12:  0-39% bilat ICA   Coronary artery disease    s/p BMS to RCA 2002; [place] 2008: oLAD 40%, pRCA stent ok with 20%, EF 60%); Myoview scan in March 2011 which was negative for ischemia    Coronary atherosclerosis 12/25/2008   Qualifier: Diagnosis of  By: Fuller Plan MD Lamont Snowball T    COVID-19 virus infection 06/05/2019   Dizziness 04/26/2018   Essential hypertension 10/08/2007   Qualifier: Diagnosis of  By: [place], [place]     GASTROESOPHAGEAL REFLUX DISEASE 10/08/2007   Qualifier: Diagnosis of  By: Danny Lawless CMA, [place]     GERD (gastroesophageal reflux disease)    Hyperlipidemia    Hypersomnia with sleep apnea  12/05/2015   Hypertension    MYOCARDIAL INFARCTION 02/03/2008   Qualifier: History of  By: Doy Mince LPN, Megan     Myocardial infarction [facility])    Obstructive sleep apnea 02/03/2008   NPSG; 02/04/2008-mild OSA-RDI 5.1 per hour CPAP 11 cm H2O.        OSA on CPAP    Plantar fasciitis, bilateral 12/13/2012   Injection Right heel Feb '14    Preoperative clearance 04/26/2018   Syncope 06/05/2019   Typical atrial flutter ([place])    Unstable angina ([place]) 12/02/2015    Past Surgical History:  Procedure Laterality Date   ACHILLES TENDON LENGTHENING Left 11/28/2020   Procedure: ACHILLES TENDON LENGTHENING;  Surgeon: Wylene Simmer, MD;  Location: [place];  Service: Orthopedics;  Laterality: Left;   ATRIAL FIBRILLATION ABLATION N/A 08/01/2019   Procedure: ATRIAL FIBRILLATION ABLATION;  Surgeon: Thompson Grayer, MD;  Location: [place] [facility];  Service: Cardiovascular;  Laterality: N/A;   CARDIAC CATHETERIZATION  06/01/2007    EF of 65% -- Nonobstructive CAD with 40% ostial stenosis in the LAD, no significant obstruction in the circumflex artery, 20% narrowing within the stent in the proximal to mid right coronary and normal LV function   CARDIAC CATHETERIZATION  02/02/2006   EF of 50%   CARDIAC CATHETERIZATION  01/19/2002   EF of 60%   CARDIAC CATHETERIZATION N/A 12/03/2015   Procedure: Left Heart Cath and Coronary Angiography;  Surgeon: Burnell Blanks, MD;  Location: [place] [facility];  Service: Cardiovascular;  Laterality: N/A;   CAROTID ENDARTERECTOMY  06/26/2002   right   CORONARY ANGIOPLASTY WITH STENT PLACEMENT  03/08/2000   Bare-metal stent in RCA, in [place] ARTHROSCOPY     right   METATARSAL OSTEOTOMY Left 11/28/2020   Procedure: Plantar fascial release and First metatarsal dorsiflexion osteotomy;  Surgeon: Wylene Simmer, MD;  Location: [place];  Service: Orthopedics;  Laterality: Left;   MOLE REMOVAL     Prior moles removed   TOTAL ANKLE  ARTHROPLASTY Left 11/28/2020   Procedure: Left total ankle arthroplasty;  Surgeon: Wylene Simmer, MD;  Location: [place];  Service: Orthopedics;  Laterality: Left;    Family History  Problem Relation Age of Onset   Heart failure Father    Heart disease Father    Peripheral vascular disease Mother        with pacemaker, and had a carotid endarterectomy   Heart disease Mother    Hypertension Brother    Hypertension Brother     Social History   Tobacco Use   Smoking status: Former    Packs/day: 1.00    Years: 30.00    Pack years: 30.00    Types: Cigarettes    Quit date: 10/19/2000  Years since quitting: 20.9   Smokeless tobacco: Current    Types: Snuff   Tobacco comments:    Dips snuff.   Substance Use Topics   Alcohol use: Yes    Alcohol/week: 1.0 standard drink    Types: 1 Cans of beer per week    Comment: Occasional beer    Subjective:   Presents for yearly CPE: accompanied by his wife; no acute concerns today; did see his urologist last week for prostate exam; overdue to see cardiology for yearly check up;  Does not want flu shot, pneumonia shot or Shingrix;   Review of Systems  Constitutional: Negative.   HENT: Negative.    Eyes: Negative.   Respiratory: Negative.    Cardiovascular: Negative.   Gastrointestinal: Negative.   Genitourinary: Negative.   Skin: Negative.   Neurological: Negative.   Endo/Heme/Allergies: Negative.   Psychiatric/Behavioral: Negative.        Objective:  Vitals:   09/30/21 1518  BP: 122/68  Pulse: 62  Temp: 98.2 F (36.8 C)  SpO2: 96%  Weight: 204 lb 6.4 oz (92.7 kg)  Height: 6' 1" (1.854 m)    General: Well developed, well nourished, in no acute distress  Skin : Warm and dry.  Head: Normocephalic and atraumatic  Eyes: Sclera and conjunctiva clear; pupils round and reactive to light; extraocular movements intact  Ears: External normal; canals clear; tympanic membranes normal  Oropharynx: Pink, supple. No  suspicious lesions  Neck: Supple without thyromegaly, adenopathy  Lungs: Respirations unlabored; clear to auscultation bilaterally without wheeze, rales, rhonchi  CVS exam: normal rate and regular rhythm.  Abdomen: Soft; nontender; nondistended; normoactive bowel sounds; no masses or hepatosplenomegaly  Musculoskeletal: No deformities; no active joint inflammation  Extremities: No edema, cyanosis, clubbing  Vessels: Symmetric bilaterally  Neurologic: Alert and oriented; speech intact; face symmetrical; moves all extremities well; CNII-XII intact without focal deficit    Assessment:  1. PE (physical exam), annual   2. Paroxysmal atrial fibrillation (HCC)   3. CAD in native artery   4. Lipid screening   5. Need for hepatitis C screening test     Plan:  Age appropriate preventive healthcare needs addressed; encouraged regular eye doctor and dental exams; encouraged regular exercise; will update labs and refills as needed today; follow-up to be determined; Patient is overdue to see his cardiologist- referral updated; Encouraged to consider getting COVID booster; he defers flu, pneumonia and Shingrix;   This visit occurred during the SARS-CoV-2 public health emergency.  Safety protocols were in place, including screening questions prior to the visit, additional usage of staff PPE, and extensive cleaning of exam room while observing appropriate contact time as indicated for disinfecting solutions.    No follow-ups on file.  Orders Placed This Encounter  Procedures   CBC with Differential/Platelet   Comp Met (CMET)   Lipid panel   Hepatitis C Antibody   Ambulatory referral to Cardiology    Referral Priority:   Routine    Referral Type:   Consultation    Referral Reason:   Specialty Services Required    Referred to Provider:   Burnell Blanks, MD    Requested Specialty:   Cardiology    Number of Visits Requested:   1    Requested Prescriptions    No prescriptions requested  or ordered in this encounter

## 2021-10-01 LAB — LIPID PANEL
Cholesterol: 115 mg/dL (ref 0–200)
HDL: 40.3 mg/dL (ref >39.00)
LDL Cholesterol: 60 mg/dL (ref 0–99)
NonHDL: 75.18
Total CHOL/HDL Ratio: 3
Triglycerides: 75 mg/dL (ref 0.0–149.0)
VLDL: 15 mg/dL (ref 0.0–40.0)

## 2021-10-01 LAB — CBC WITH DIFFERENTIAL/PLATELET
Basophils Absolute: 0.1 10*3/uL (ref 0.0–0.1)
Basophils Relative: 1.5 % (ref 0.0–3.0)
Eosinophils Absolute: 0.1 10*3/uL (ref 0.0–0.7)
Eosinophils Relative: 2.3 % (ref 0.0–5.0)
HCT: 39.4 % (ref 39.0–52.0)
Hemoglobin: 13.5 g/dL (ref 13.0–17.0)
Lymphocytes Relative: 30.3 % (ref 12.0–46.0)
Lymphs Abs: 1.2 10*3/uL (ref 0.7–4.0)
MCHC: 34.2 g/dL (ref 30.0–36.0)
MCV: 90.3 fl (ref 78.0–100.0)
Monocytes Absolute: 0.5 10*3/uL (ref 0.1–1.0)
Monocytes Relative: 12.3 % — ABNORMAL HIGH (ref 3.0–12.0)
Neutro Abs: 2.1 10*3/uL (ref 1.4–7.7)
Neutrophils Relative %: 53.6 % (ref 43.0–77.0)
Platelets: 156 10*3/uL (ref 150.0–400.0)
RBC: 4.36 Mil/uL (ref 4.22–5.81)
RDW: 13.4 % (ref 11.5–15.5)
WBC: 4 10*3/uL (ref 4.0–10.5)

## 2021-10-01 LAB — COMPREHENSIVE METABOLIC PANEL
ALT: 27 U/L (ref 0–53)
AST: 23 U/L (ref 0–37)
Albumin: 4.5 g/dL (ref 3.5–5.2)
Alkaline Phosphatase: 52 U/L (ref 39–117)
BUN: 14 mg/dL (ref 6–23)
CO2: 30 mEq/L (ref 19–32)
Calcium: 9.7 mg/dL (ref 8.4–10.5)
Chloride: 105 mEq/L (ref 96–112)
Creatinine, Ser: 1.11 mg/dL (ref 0.40–1.50)
GFR: 68.04 mL/min (ref >60.00)
Glucose, Bld: 84 mg/dL (ref 70–99)
Potassium: 4.1 mEq/L (ref 3.5–5.1)
Sodium: 142 mEq/L (ref 135–145)
Total Bilirubin: 1.2 mg/dL (ref 0.2–1.2)
Total Protein: 7.2 g/dL (ref 6.0–8.3)

## 2021-10-01 LAB — HEPATITIS C ANTIBODY
Hepatitis C Ab: NONREACTIVE
SIGNAL TO CUT-OFF: 0.04 (ref <1.00)

## 2021-10-07 NOTE — Progress Notes (Signed)
Lab letter sent 

## 2021-10-20 ENCOUNTER — Other Ambulatory Visit: Payer: Self-pay | Admitting: Cardiovascular Disease

## 2021-10-28 ENCOUNTER — Other Ambulatory Visit: Payer: Self-pay

## 2021-10-28 NOTE — Telephone Encounter (Signed)
Rx refill request sent by paper. Okay for refill?

## 2021-10-29 ENCOUNTER — Other Ambulatory Visit: Payer: Self-pay | Admitting: Family

## 2021-10-29 MED ORDER — TIZANIDINE HCL 2 MG PO TABS: ORAL_TABLET | ORAL | 3 refills | Status: DC

## 2021-11-13 ENCOUNTER — Other Ambulatory Visit: Payer: Self-pay | Admitting: Family

## 2021-11-14 ENCOUNTER — Encounter: Payer: Self-pay | Admitting: Family

## 2021-11-23 NOTE — Progress Notes (Signed)
Chief Complaint  Patient presents with   Follow-up    CAD   History of Present Illness: 71 yo male with history of PAF, sleep apnea, CAD, HTN, hyperlipidemia and carotid artery disease s/p right CEA who is here today for cardiac follow up. In 2002 he had a bare metal stent placed in the right coronary artery. A catheterization in 2008 showed non-obstructive disease. Had a Myoview scan in March 2011 which was negative for ischemia. Cardiac cath February 2017 with patent RCA stent and mild non-obstructive disease in the RCA, Circumflex and LAD. Nuclear stress test July 2019 with no ischemia. Echo August 2019 with TDHR=41-63%, grade 2 diastolic dysfunction. No significant valve disease. Carotid artery dopplers 2021 with mild bilateral carotid artery disease. Cardiac monitor in 2019 with nocturnal sinus bradycardia and short 3 second runs of SVT. He was admitted to [facility] August 2019 with atrial fibrillation with rapid ventricular response and was started on Eliquis. He converted to sinus in the ED after one dose of IV metoprolol. He underwent atrial fibrillation ablation in October 2020. Echo August 2020 with LVEF=60-65%. He has not been on a beta blocker due to bradycardia.   He is here today for follow up. The patient denies any chest pain, dyspnea, palpitations, lower extremity edema, orthopnea, PND, dizziness, near syncope or syncope.   Primary Care Physician: [name], FNP  Past Medical History:  Diagnosis Date   Acute left ankle pain 08/05/2018   AKI (acute kidney injury) ([place]) 06/05/2019   Atrial fibrillation ([place])    Atrial fibrillation with RVR ([place]) 06/29/2019   Bradycardia    Bradycardia 04/26/2018   Carotid artery disease ([place])    Right CEA;  dopplers 6/12:  0-39% bilat ICA   Coronary artery disease    s/p BMS to RCA 2002; [place] 2008: oLAD 40%, pRCA stent ok with 20%, EF 60%); Myoview scan in March 2011 which was negative for ischemia    Coronary atherosclerosis 12/25/2008    Qualifier: Diagnosis of  By: Fuller Plan MD Lamont Snowball T    COVID-19 virus infection 06/05/2019   Dizziness 04/26/2018   Essential hypertension 10/08/2007   Qualifier: Diagnosis of  By: [place], [place]     GASTROESOPHAGEAL REFLUX DISEASE 10/08/2007   Qualifier: Diagnosis of  By: Danny Lawless CMA, [place]     GERD (gastroesophageal reflux disease)    Hyperlipidemia    Hypersomnia with sleep apnea 12/05/2015   Hypertension    MYOCARDIAL INFARCTION 02/03/2008   Qualifier: History of  By: Doy Mince LPN, Megan     Myocardial infarction [facility])    Obstructive sleep apnea 02/03/2008   NPSG; 02/04/2008-mild OSA-RDI 5.1 per hour CPAP 11 cm H2O.        OSA on CPAP    Plantar fasciitis, bilateral 12/13/2012   Injection Right heel Feb '14    Preoperative clearance 04/26/2018   Syncope 06/05/2019   Typical atrial flutter ([place])    Unstable angina ([place]) 12/02/2015    Past Surgical History:  Procedure Laterality Date   ACHILLES TENDON LENGTHENING Left 11/28/2020   Procedure: ACHILLES TENDON LENGTHENING;  Surgeon: Wylene Simmer, MD;  Location: [place];  Service: Orthopedics;  Laterality: Left;   ATRIAL FIBRILLATION ABLATION N/A 08/01/2019   Procedure: ATRIAL FIBRILLATION ABLATION;  Surgeon: Thompson Grayer, MD;  Location: [place] [facility];  Service: Cardiovascular;  Laterality: N/A;   CARDIAC CATHETERIZATION  06/01/2007    EF of 65% -- Nonobstructive CAD with 40% ostial stenosis in the LAD,  no significant obstruction in the circumflex artery, 20% narrowing within the stent in the proximal to mid right coronary and normal LV function   CARDIAC CATHETERIZATION  02/02/2006   EF of 50%   CARDIAC CATHETERIZATION  01/19/2002   EF of 60%   CARDIAC CATHETERIZATION N/A 12/03/2015   Procedure: Left Heart Cath and Coronary Angiography;  Surgeon: Burnell Blanks, MD;  Location: [place] [facility];  Service: Cardiovascular;  Laterality: N/A;   CAROTID ENDARTERECTOMY  06/26/2002   right   CORONARY  ANGIOPLASTY WITH STENT PLACEMENT  03/08/2000   Bare-metal stent in RCA, in [place] ARTHROSCOPY     right   METATARSAL OSTEOTOMY Left 11/28/2020   Procedure: Plantar fascial release and First metatarsal dorsiflexion osteotomy;  Surgeon: Wylene Simmer, MD;  Location: [place];  Service: Orthopedics;  Laterality: Left;   MOLE REMOVAL     Prior moles removed   TOTAL ANKLE ARTHROPLASTY Left 11/28/2020   Procedure: Left total ankle arthroplasty;  Surgeon: Wylene Simmer, MD;  Location: [place];  Service: Orthopedics;  Laterality: Left;    Current Outpatient Medications  Medication Sig Dispense Refill   amLODipine (NORVASC) 10 MG tablet TAKE 1 TABLET BY MOUTH  DAILY 90 tablet 3   b complex vitamins capsule Take 1 capsule by mouth daily.     docusate sodium (COLACE) 100 MG capsule Take 1 capsule (100 mg total) by mouth 2 (two) times daily. While taking narcotic pain medicine. 30 capsule 0   doxazosin (CARDURA) 2 MG tablet TAKE 1 TABLET BY MOUTH AT  BEDTIME 90 tablet 3   ELIQUIS 5 MG TABS tablet TAKE 1 TABLET BY MOUTH  TWICE DAILY 180 tablet 3   ezetimibe (ZETIA) 10 MG tablet TAKE 1 TABLET BY MOUTH  DAILY 90 tablet 3   finasteride (PROSCAR) 5 MG tablet TAKE 1 TABLET BY MOUTH  DAILY 90 tablet 0   gabapentin (NEURONTIN) 300 MG capsule Take 3 capsules every night 90 capsule 11   hydrochlorothiazide (HYDRODIURIL) 25 MG tablet TAKE 1 TABLET BY MOUTH  DAILY 90 tablet 3   metoprolol tartrate (LOPRESSOR) 25 MG tablet TAKE 1 TABLET BY MOUTH  DAILY AS NEEDED FOR  PALPITATIONS AND AFIB 90 tablet 3   pantoprazole (PROTONIX) 40 MG tablet TAKE 1 TABLET BY MOUTH  DAILY 90 tablet 3   potassium chloride (KLOR-CON M) 10 MEQ tablet TAKE 1 TABLET BY MOUTH DAILY 90 tablet 3   ramipril (ALTACE) 10 MG capsule TAKE 1 CAPSULE BY MOUTH  TWICE DAILY 180 capsule 0   rosuvastatin (CRESTOR) 40 MG tablet TAKE 1 TABLET BY MOUTH  DAILY 90 tablet 3   tiZANidine (ZANAFLEX) 2 MG tablet Take  one tablet at bedtime as needed for pain, if tolerating, ok to take twice daily as needed 90 tablet 3   isosorbide mononitrate (IMDUR) 30 MG 24 hr tablet Take 1 tablet (30 mg total) by mouth daily. 90 tablet 3   nitroGLYCERIN (NITROSTAT) 0.4 MG SL tablet Place 1 tablet (0.4 mg total) under the tongue every 5 (five) minutes as needed. Call 9-1-1 if need more than 2. 25 tablet 0   No current facility-administered medications for this visit.    Allergies  Allergen Reactions   Felodipine Swelling    Legs became swollen   Niacin Other (See Comments)    Made patient feel flushed    Social History   Socioeconomic History   Marital status: Married    Spouse name: Not  on file   Number of children: 3   Years of education: 12   Highest education level: High school graduate  Occupational History   Occupation: Architect  Tobacco Use   Smoking status: Former    Packs/day: 1.00    Years: 30.00    Pack years: 30.00    Types: Cigarettes    Quit date: 10/19/2000    Years since quitting: 21.1   Smokeless tobacco: Current    Types: Snuff   Tobacco comments:    Dips snuff.   Vaping Use   Vaping Use: Never used  Substance and Sexual Activity   Alcohol use: Yes    Alcohol/week: 1.0 standard drink    Types: 1 Cans of beer per week    Comment: Occasional beer   Drug use: No   Sexual activity: Yes  Other Topics Concern   Not on file  Social History Narrative   Lives with wife in a one story home.  Has 3 children.  Works in Architect.  Education: high school. Patient is right handed.   Social Determinants of Health   Financial Resource Strain: Not on file  Food Insecurity: Not on file  Transportation Needs: Not on file  Physical Activity: Not on file  Stress: Not on file  Social Connections: Not on file  Intimate Partner Violence: Not on file    Family History  Problem Relation Age of Onset   Heart failure Father    Heart disease Father    Peripheral vascular disease Mother         with pacemaker, and had a carotid endarterectomy   Heart disease Mother    Hypertension Brother    Hypertension Brother     Review of Systems:  As stated in the HPI and otherwise negative.   BP 128/70    Pulse 61    Ht 6\' 1"  (1.854 m)    Wt 207 lb 12.8 oz (94.3 kg)    SpO2 96%    BMI 27.42 kg/m   Physical Examination: General: Well developed, well nourished, NAD  HEENT: OP clear, mucus membranes moist  SKIN: warm, dry. No rashes. Neuro: No focal deficits  Musculoskeletal: Muscle strength 5/5 all ext  Psychiatric: Mood and affect normal  Neck: No JVD, no carotid bruits, no thyromegaly, no lymphadenopathy.  Lungs:Clear bilaterally, no wheezes, rhonci, crackles Cardiovascular: Regular rate and rhythm. No murmurs, gallops or rubs. Abdomen:Soft. Bowel sounds present. Non-tender.  Extremities: No lower extremity edema. Pulses are 2 + in the bilateral DP/PT.  EKG:  EKG is not  ordered today. The ekg ordered today demonstrates Sinus  Echo August 2020:  1. The left ventricle has normal systolic function, with an ejection  fraction of 60-65%. The cavity size was normal. There is mildly increased  left ventricular wall thickness. Left ventricular diastolic Doppler  parameters are consistent with impaired  relaxation. Indeterminate filling pressures The E/e' is 8-15. No evidence  of left ventricular regional wall motion abnormalities.   2. The right ventricle has normal systolc function. The cavity was  normal. There is no increase in right ventricular wall thickness.   3. The mitral valve is grossly normal.   4. The tricuspid valve was grossly normal.   5. The aortic valve is tricuspid Mild sclerosis of the aortic valve. No  stenosis of the aortic valve.   Recent Labs: 09/30/2021: ALT 27; BUN 14; Creatinine, Ser 1.11; Hemoglobin 13.5; Platelets 156.0; Potassium 4.1; Sodium 142   Lipid Panel  Component Value Date/Time   CHOL 115 09/30/2021 1556   CHOL 115 07/31/2020 1024    TRIG 75.0 09/30/2021 1556   HDL 40.30 09/30/2021 1556   HDL 36 (L) 07/31/2020 1024   CHOLHDL 3 09/30/2021 1556   VLDL 15.0 09/30/2021 1556   LDLCALC 60 09/30/2021 1556   LDLCALC 57 07/31/2020 1024     Wt Readings from Last 3 Encounters:  11/24/21 207 lb 12.8 oz (94.3 kg)  09/30/21 204 lb 6.4 oz (92.7 kg)  04/07/21 205 lb (93 kg)     Other studies Reviewed: Additional studies/ records that were reviewed today include:  Review of the above records demonstrates:   Assessment and Plan:   1. CAD without angina: Cardiac cath 2017 with stable CAD. Nuclear stress test July 2019 with no ischemia. No chest pain. Will continue ASA, Imdur, statin and Zetia. No beta blocker given nocturnal bradycardia and fatigue.    2. Hyperlipidemia:  LDL 60 in December 2022. Continue statin and Zetia  3. Hypertension: BP is well controlled. No changes  4. Carotid Artery Disease: Mild bilateral ICA stenosis by dopplers November 2022.   5. Atrial fibrillation, paroxysmal: He is post ablation. Sinus today. CHADS VASC score 3. Continue Eliquis and as needed metoprolol.   Current medicines are reviewed at length with the patient today.  The patient does not have concerns regarding medicines.  The following changes have been made:  no change  Labs/ tests ordered today include:   Orders Placed This Encounter  Procedures   EKG 12-Lead     Disposition:   FU with me in 12 months   Signed, Lauree Chandler, MD 11/24/2021 9:39 AM    [facility] [place], [place], [place]  [address] Phone: [phone]; Fax: [phone]

## 2021-11-24 ENCOUNTER — Other Ambulatory Visit: Payer: Self-pay

## 2021-11-24 ENCOUNTER — Ambulatory Visit: Payer: Medicare Other | Admitting: Cardiovascular Disease

## 2021-11-24 ENCOUNTER — Encounter: Payer: Self-pay | Admitting: Cardiovascular Disease

## 2021-11-24 VITALS — BP 128/70 | HR 61 | Ht 73.0 in | Wt 207.8 lb

## 2021-11-24 DIAGNOSIS — E785 Hyperlipidemia, unspecified: Secondary | ICD-10-CM

## 2021-11-24 DIAGNOSIS — I779 Disorder of arteries and arterioles, unspecified: Secondary | ICD-10-CM

## 2021-11-24 DIAGNOSIS — I10 Essential (primary) hypertension: Secondary | ICD-10-CM

## 2021-11-24 DIAGNOSIS — I480 Paroxysmal atrial fibrillation: Secondary | ICD-10-CM | POA: Diagnosis not present

## 2021-11-24 DIAGNOSIS — I2510 Atherosclerotic heart disease of native coronary artery without angina pectoris: Secondary | ICD-10-CM

## 2021-11-24 MED ORDER — ISOSORBIDE MONONITRATE ER 30 MG PO TB24: 30.0000 mg | ORAL_TABLET | Freq: Every day | ORAL | 3 refills | Status: DC

## 2021-11-24 MED ORDER — NITROGLYCERIN 0.4 MG SL SUBL: 0.4000 mg | SUBLINGUAL_TABLET | SUBLINGUAL | 0 refills | Status: AC | PRN

## 2021-11-24 NOTE — Patient Instructions (Signed)
Medication Instructions:  No changes made today *If you need a refill on your cardiac medications before your next appointment, please call your pharmacy*  Lab Work: None ordered   Testing/Procedures: None ordered  Follow-Up: At [facility], you and your health needs are our priority.  As part of our continuing mission to provide you with exceptional heart care, we have created designated Provider Care Teams.  These Care Teams include your primary Cardiologist (physician) and Advanced Practice Providers (APPs -  Physician Assistants and Nurse Practitioners) who all work together to provide you with the care you need, when you need it.  We recommend signing up for the patient portal called "MyChart".  Sign up information is provided on this After Visit Summary.  MyChart is used to connect with patients for Virtual Visits (Telemedicine).  Patients are able to view lab/test results, encounter notes, upcoming appointments, etc.  Non-urgent messages can be sent to your provider as well.   To learn more about what you can do with MyChart, go to [email or web address].    Your next appointment:   12 month(s)  The format for your next appointment:   In Person  Provider:   Lauree Chandler, MD {    Other Instructions

## 2021-11-25 ENCOUNTER — Other Ambulatory Visit: Payer: Self-pay | Admitting: *Deleted

## 2021-11-25 MED ORDER — TIZANIDINE HCL 2 MG PO TABS: ORAL_TABLET | ORAL | 3 refills | Status: DC

## 2021-11-25 NOTE — Telephone Encounter (Signed)
[facility] requesting refill for tizanidine.  Are you ok with filling?

## 2021-11-30 ENCOUNTER — Other Ambulatory Visit: Payer: Self-pay | Admitting: Family

## 2021-11-30 DIAGNOSIS — I10 Essential (primary) hypertension: Secondary | ICD-10-CM

## 2021-12-03 DIAGNOSIS — L988 Other specified disorders of the skin and subcutaneous tissue: Secondary | ICD-10-CM | POA: Diagnosis not present

## 2021-12-03 DIAGNOSIS — R21 Rash and other nonspecific skin eruption: Secondary | ICD-10-CM | POA: Diagnosis not present

## 2021-12-03 DIAGNOSIS — L309 Dermatitis, unspecified: Secondary | ICD-10-CM | POA: Diagnosis not present

## 2021-12-11 DIAGNOSIS — Z79899 Other long term (current) drug therapy: Secondary | ICD-10-CM | POA: Diagnosis not present

## 2021-12-11 DIAGNOSIS — L931 Subacute cutaneous lupus erythematosus: Secondary | ICD-10-CM | POA: Diagnosis not present

## 2021-12-12 ENCOUNTER — Ambulatory Visit: Payer: Medicare Other | Admitting: Cardiovascular Disease

## 2021-12-12 ENCOUNTER — Encounter: Payer: Self-pay | Admitting: Family

## 2021-12-12 ENCOUNTER — Encounter: Payer: Self-pay | Admitting: Cardiovascular Disease

## 2021-12-12 NOTE — Telephone Encounter (Signed)
Imdur discontinued from med list per [employer].

## 2021-12-18 ENCOUNTER — Other Ambulatory Visit: Payer: Self-pay | Admitting: Family

## 2021-12-18 ENCOUNTER — Encounter: Payer: Self-pay | Admitting: Family

## 2021-12-18 DIAGNOSIS — M329 Systemic lupus erythematosus, unspecified: Secondary | ICD-10-CM

## 2021-12-25 ENCOUNTER — Other Ambulatory Visit: Payer: Self-pay | Admitting: Family

## 2021-12-25 DIAGNOSIS — M329 Systemic lupus erythematosus, unspecified: Secondary | ICD-10-CM

## 2021-12-31 ENCOUNTER — Other Ambulatory Visit: Payer: Self-pay | Admitting: Family

## 2022-01-06 ENCOUNTER — Encounter: Payer: Self-pay | Admitting: Cardiovascular Disease

## 2022-01-09 MED ORDER — RANOLAZINE ER 500 MG PO TB12: 500.0000 mg | ORAL_TABLET | Freq: Two times a day (BID) | ORAL | 3 refills | Status: DC

## 2022-01-09 NOTE — Telephone Encounter (Signed)
Called pt left a message to call the office.  Need to inform pt that MD would like him to start Ranexa 500 mg PO BID.   ?

## 2022-01-09 NOTE — Telephone Encounter (Signed)
Spoke with pt advised of MD recommendation to start Ranexa 500 mg PO BID.  Pt reports continues to take isosorbide d/t chest pain when stopped taking.  Advised pt not to take both meds on the same day. Also advised pt to call into the office if he feels Ranexa is not controlling CP.  Pt verbalizes understanding.    ?

## 2022-01-22 ENCOUNTER — Telehealth: Payer: Self-pay | Admitting: Family

## 2022-01-22 DIAGNOSIS — L931 Subacute cutaneous lupus erythematosus: Secondary | ICD-10-CM | POA: Diagnosis not present

## 2022-01-22 NOTE — Telephone Encounter (Signed)
Left message for patient to call back and schedule Medicare Annual Wellness Visit (AWV). Please offer to do virtually or by telephone.  Left office number and my jabber #[phone]. ? ?Due for AWVI ? ?Please schedule at anytime with Nurse Health Advisor. ?  ?

## 2022-01-27 DIAGNOSIS — M329 Systemic lupus erythematosus, unspecified: Secondary | ICD-10-CM | POA: Diagnosis not present

## 2022-01-27 DIAGNOSIS — M1990 Unspecified osteoarthritis, unspecified site: Secondary | ICD-10-CM | POA: Diagnosis not present

## 2022-01-27 DIAGNOSIS — M25562 Pain in left knee: Secondary | ICD-10-CM | POA: Diagnosis not present

## 2022-01-27 DIAGNOSIS — R21 Rash and other nonspecific skin eruption: Secondary | ICD-10-CM | POA: Diagnosis not present

## 2022-02-03 ENCOUNTER — Other Ambulatory Visit: Payer: Self-pay | Admitting: Family

## 2022-02-13 ENCOUNTER — Other Ambulatory Visit: Payer: Self-pay | Admitting: Family

## 2022-02-17 DIAGNOSIS — L959 Vasculitis limited to the skin, unspecified: Secondary | ICD-10-CM | POA: Diagnosis not present

## 2022-02-17 DIAGNOSIS — M25562 Pain in left knee: Secondary | ICD-10-CM | POA: Diagnosis not present

## 2022-02-17 DIAGNOSIS — M329 Systemic lupus erythematosus, unspecified: Secondary | ICD-10-CM | POA: Diagnosis not present

## 2022-02-17 DIAGNOSIS — R21 Rash and other nonspecific skin eruption: Secondary | ICD-10-CM | POA: Diagnosis not present

## 2022-02-17 DIAGNOSIS — M1990 Unspecified osteoarthritis, unspecified site: Secondary | ICD-10-CM | POA: Diagnosis not present

## 2022-02-18 ENCOUNTER — Ambulatory Visit (INDEPENDENT_AMBULATORY_CARE_PROVIDER_SITE_OTHER): Payer: Medicare Other

## 2022-02-18 DIAGNOSIS — Z0000 Encounter for general adult medical examination without abnormal findings: Secondary | ICD-10-CM | POA: Diagnosis not present

## 2022-02-18 NOTE — Progress Notes (Addendum)
? ?Subjective:  ? [name] is a 71 y.o. male who presents for an Initial Medicare Annual Wellness Visit. ? ?I connected with  [name] on 02/18/22 by a audio enabled telemedicine application and verified that I am speaking with the correct person using two identifiers. ? ?Patient Location: Home ? ?Provider Location: Office/Clinic ? ?I discussed the limitations of evaluation and management by telemedicine. The patient expressed understanding and agreed to proceed.  ? ?Review of Systems    ? ?  ? ?   ?Objective:  ?  ?There were no vitals filed for this visit. ?There is no height or weight on file to calculate BMI. ? ? ?  11/28/2020  ?  6:33 AM 11/21/2020  ?  2:17 PM 04/01/2020  ? 12:56 PM 08/01/2019  ?  9:18 AM 06/29/2019  ? 10:27 AM 06/05/2019  ?  8:07 PM 05/09/2019  ?  3:08 PM  ?Advanced Directives  ?Does Patient Have a Medical Advance Directive? Yes Yes No Yes Yes No Yes  ?Type of Paramedic of [place];Living will   Living will Living will  [place]  ?Does patient want to make changes to medical advance directive? No - Patient declined No - Patient declined  No - Patient declined No - Patient declined    ?Copy of [place] in Chart? No - copy requested        ?Would patient like information on creating a medical advance directive?     No - Patient declined No - Patient declined   ? ? ?Current Medications (verified) ?Outpatient Encounter Medications as of 02/18/2022  ?Medication Sig  ? amLODipine (NORVASC) 10 MG tablet TAKE 1 TABLET BY MOUTH  DAILY  ? b complex vitamins capsule Take 1 capsule by mouth daily.  ? docusate sodium (COLACE) 100 MG capsule Take 1 capsule (100 mg total) by mouth 2 (two) times daily. While taking narcotic pain medicine.  ? doxazosin (CARDURA) 2 MG tablet TAKE 1 TABLET BY MOUTH AT  BEDTIME  ? ELIQUIS 5 MG TABS tablet TAKE 1 TABLET BY MOUTH  TWICE DAILY  ? ezetimibe (ZETIA) 10 MG tablet TAKE 1 TABLET BY MOUTH  DAILY  ? finasteride  (PROSCAR) 5 MG tablet TAKE 1 TABLET BY MOUTH DAILY  ? gabapentin (NEURONTIN) 300 MG capsule Take 3 capsules every night  ? hydrochlorothiazide (HYDRODIURIL) 25 MG tablet TAKE 1 TABLET BY MOUTH  DAILY  ? metoprolol tartrate (LOPRESSOR) 25 MG tablet TAKE 1 TABLET BY MOUTH  DAILY AS NEEDED FOR  PALPITATIONS AND AFIB  ? nitroGLYCERIN (NITROSTAT) 0.4 MG SL tablet Place 1 tablet (0.4 mg total) under the tongue every 5 (five) minutes as needed. Call 9-1-1 if need more than 2.  ? pantoprazole (PROTONIX) 40 MG tablet TAKE 1 TABLET BY MOUTH  DAILY  ? potassium chloride (KLOR-CON M) 10 MEQ tablet TAKE 1 TABLET BY MOUTH DAILY  ? ramipril (ALTACE) 10 MG capsule TAKE 1 CAPSULE BY MOUTH TWICE  DAILY  ? ranolazine (RANEXA) 500 MG 12 hr tablet Take 1 tablet (500 mg total) by mouth 2 (two) times daily.  ? rosuvastatin (CRESTOR) 40 MG tablet TAKE 1 TABLET BY MOUTH  DAILY  ? tiZANidine (ZANAFLEX) 2 MG tablet Take one tablet at bedtime as needed for pain, if tolerating, ok to take twice daily as needed  ? ?No facility-administered encounter medications on file as of 02/18/2022.  ? ? ?Allergies (verified) ?Felodipine and Niacin  ? ?History: ?Past  Medical History:  ?Diagnosis Date  ? Acute left ankle pain 08/05/2018  ? AKI (acute kidney injury) ([place]) 06/05/2019  ? Atrial fibrillation ([place])   ? Atrial fibrillation with RVR ([place]) 06/29/2019  ? Bradycardia   ? Bradycardia 04/26/2018  ? Carotid artery disease ([place])   ? Right CEA;  dopplers 6/12:  0-39% bilat ICA  ? Coronary artery disease   ? s/p BMS to RCA 2002; [place] 2008: oLAD 40%, pRCA stent ok with 20%, EF 60%); Myoview scan in March 2011 which was negative for ischemia   ? Coronary atherosclerosis 12/25/2008  ? Qualifier: Diagnosis of  By: Fuller Plan MD Lamont Snowball T   ? COVID-19 virus infection 06/05/2019  ? Dizziness 04/26/2018  ? Essential hypertension 10/08/2007  ? Qualifier: Diagnosis of  By: [place], [place]    ? GASTROESOPHAGEAL REFLUX DISEASE 10/08/2007  ? Qualifier: Diagnosis of  By:  [place], [place]    ? GERD (gastroesophageal reflux disease)   ? Hyperlipidemia   ? Hypersomnia with sleep apnea 12/05/2015  ? Hypertension   ? MYOCARDIAL INFARCTION 02/03/2008  ? Qualifier: History of  By: Doy Mince LPN, Megan    ? Myocardial infarction [facility])   ? Obstructive sleep apnea 02/03/2008  ? NPSG; 02/04/2008-mild OSA-RDI 5.1 per hour CPAP 11 cm H2O.       ? OSA on CPAP   ? Plantar fasciitis, bilateral 12/13/2012  ? Injection Right heel Feb '14   ? Preoperative clearance 04/26/2018  ? Syncope 06/05/2019  ? Typical atrial flutter ([place])   ? Unstable angina ([place]) 12/02/2015  ? ?Past Surgical History:  ?Procedure Laterality Date  ? ACHILLES TENDON LENGTHENING Left 11/28/2020  ? Procedure: ACHILLES TENDON LENGTHENING;  Surgeon: Wylene Simmer, MD;  Location: [place];  Service: Orthopedics;  Laterality: Left;  ? ATRIAL FIBRILLATION ABLATION N/A 08/01/2019  ? Procedure: ATRIAL FIBRILLATION ABLATION;  Surgeon: Thompson Grayer, MD;  Location: [place] [facility];  Service: Cardiovascular;  Laterality: N/A;  ? CARDIAC CATHETERIZATION  06/01/2007   ? EF of 65% -- Nonobstructive CAD with 40% ostial stenosis in the LAD, no significant obstruction in the circumflex artery, 20% narrowing within the stent in the proximal to mid right coronary and normal LV function  ? CARDIAC CATHETERIZATION  02/02/2006  ? EF of 50%  ? CARDIAC CATHETERIZATION  01/19/2002  ? EF of 60%  ? CARDIAC CATHETERIZATION N/A 12/03/2015  ? Procedure: Left Heart Cath and Coronary Angiography;  Surgeon: Burnell Blanks, MD;  Location: [place] [facility];  Service: Cardiovascular;  Laterality: N/A;  ? CAROTID ENDARTERECTOMY  06/26/2002  ? right  ? CORONARY ANGIOPLASTY WITH STENT PLACEMENT  03/08/2000  ? Bare-metal stent in RCA, in [place]  ? [place] ARTHROSCOPY    ? right  ? METATARSAL OSTEOTOMY Left 11/28/2020  ? Procedure: Plantar fascial release and First metatarsal dorsiflexion osteotomy;  Surgeon: Wylene Simmer, MD;  Location: [place];  Service: Orthopedics;  Laterality: Left;  ? MOLE REMOVAL    ? Prior moles removed  ? TOTAL ANKLE ARTHROPLASTY Left 11/28/2020  ? Procedure: Left total ankle arthroplasty;  Surgeon: Wylene Simmer, MD;  Location: [place];  Service: Orthopedics;  Laterality: Left;  ? ?Family History  ?Problem Relation Age of Onset  ? Heart failure Father   ? Heart disease Father   ? Peripheral vascular disease Mother   ?     with pacemaker, and had a carotid endarterectomy  ? Heart disease Mother   ?  Hypertension Brother   ? Hypertension Brother   ? ?Social History  ? ?Socioeconomic History  ? Marital status: Married  ?  Spouse name: Not on file  ? Number of children: 3  ? Years of education: 52  ? Highest education level: High school graduate  ?Occupational History  ? Occupation: Architect  ?Tobacco Use  ? Smoking status: Former  ?  Packs/day: 1.00  ?  Years: 30.00  ?  Pack years: 30.00  ?  Types: Cigarettes  ?  Quit date: 10/19/2000  ?  Years since quitting: 21.3  ? Smokeless tobacco: Current  ?  Types: Snuff  ? Tobacco comments:  ?  Dips snuff.   ?Vaping Use  ? Vaping Use: Never used  ?Substance and Sexual Activity  ? Alcohol use: Yes  ?  Alcohol/week: 1.0 standard drink  ?  Types: 1 Cans of beer per week  ?  Comment: Occasional beer  ? Drug use: No  ? Sexual activity: Yes  ?Other Topics Concern  ? Not on file  ?Social History Narrative  ? Lives with wife in a one story home.  Has 3 children.  Works in Architect.  Education: high school. Patient is right handed.  ? ?Social Determinants of Health  ? ?Financial Resource Strain: Not on file  ?Food Insecurity: Not on file  ?Transportation Needs: Not on file  ?Physical Activity: Not on file  ?Stress: Not on file  ?Social Connections: Not on file  ? ? ?Tobacco Counseling ?Ready to quit: Not Answered ?Counseling given: Not Answered ?Tobacco comments: Dips snuff.  ? ? ?Clinical Intake: ? ?  ? ?  ? ?  ? ?  ? ?  ? ?Diabetic?No ? ?  ? ?  ? ? ?Activities of  Daily Living ?   ? View : No data to display.  ?  ?  ?  ? ? ?Patient Care Team: ?[name], FNP as PCP - General (Internal Medicine) ?Burnell Blanks, MD as PCP - Cardiology (Cardiology)

## 2022-02-18 NOTE — Patient Instructions (Signed)
[name] , ?Thank you for taking time to come for your Medicare Wellness Visit. I appreciate your ongoing commitment to your health goals. Please review the following plan we discussed and let me know if I can assist you in the future.  ? ?Screening recommendations/referrals: ?Colonoscopy: 10/31/20 due 11/01/23 ?Recommended yearly ophthalmology/optometry visit for glaucoma screening and checkup ?Recommended yearly dental visit for hygiene and checkup ? ?Vaccinations: ?Influenza vaccine: declined ?Pneumococcal vaccine: declined ?Tdap vaccine: declined ?Shingles vaccine: declined   ?Covid-19: declined ? ?Advanced directives: no ? ?Conditions/risks identified: see problem list ? ?Next appointment: Follow up in one year for your annual wellness visit. 02/23/23 ? ?Preventive Care 84 Years and Older, Male ?Preventive care refers to lifestyle choices and visits with your health care provider that can promote health and wellness. ?What does preventive care include? ?A yearly physical exam. This is also called an annual well check. ?Dental exams once or twice a year. ?Routine eye exams. Ask your health care provider how often you should have your eyes checked. ?Personal lifestyle choices, including: ?Daily care of your teeth and gums. ?Regular physical activity. ?Eating a healthy diet. ?Avoiding tobacco and drug use. ?Limiting alcohol use. ?Practicing safe sex. ?Taking low doses of aspirin every day. ?Taking vitamin and mineral supplements as recommended by your health care provider. ?What happens during an annual well check? ?The services and screenings done by your health care provider during your annual well check will depend on your age, overall health, lifestyle risk factors, and family history of disease. ?Counseling  ?Your health care provider may ask you questions about your: ?Alcohol use. ?Tobacco use. ?Drug use. ?Emotional well-being. ?Home and relationship well-being. ?Sexual activity. ?Eating habits. ?History of  falls. ?Memory and ability to understand (cognition). ?Work and work Statistician. ?Screening  ?You may have the following tests or measurements: ?Height, weight, and BMI. ?Blood pressure. ?Lipid and cholesterol levels. These may be checked every 5 years, or more frequently if you are over 69 years old. ?Skin check. ?Lung cancer screening. You may have this screening every year starting at age 41 if you have a 30-pack-year history of smoking and currently smoke or have quit within the past 15 years. ?Fecal occult blood test (FOBT) of the stool. You may have this test every year starting at age 45. ?Flexible sigmoidoscopy or colonoscopy. You may have a sigmoidoscopy every 5 years or a colonoscopy every 10 years starting at age 34. ?Prostate cancer screening. Recommendations will vary depending on your family history and other risks. ?Hepatitis C blood test. ?Hepatitis B blood test. ?Sexually transmitted disease (STD) testing. ?Diabetes screening. This is done by checking your blood sugar (glucose) after you have not eaten for a while (fasting). You may have this done every 1-3 years. ?Abdominal aortic aneurysm (AAA) screening. You may need this if you are a current or former smoker. ?Osteoporosis. You may be screened starting at age 69 if you are at high risk. ?Talk with your health care provider about your test results, treatment options, and if necessary, the need for more tests. ?Vaccines  ?Your health care provider may recommend certain vaccines, such as: ?Influenza vaccine. This is recommended every year. ?Tetanus, diphtheria, and acellular pertussis (Tdap, Td) vaccine. You may need a Td booster every 10 years. ?Zoster vaccine. You may need this after age 45. ?Pneumococcal 13-valent conjugate (PCV13) vaccine. One dose is recommended after age 61. ?Pneumococcal polysaccharide (PPSV23) vaccine. One dose is recommended after age 69. ?Talk to your health care provider about which  screenings and vaccines you need and  how often you need them. ?This information is not intended to replace advice given to you by your health care provider. Make sure you discuss any questions you have with your health care provider. ?Document Released: 11/01/2015 Document Revised: 06/24/2016 Document Reviewed: 08/06/2015 ?Elsevier Interactive Patient Education ? 2017 [place]. ? ?Fall Prevention in the Home ?Falls can cause injuries. They can happen to people of all ages. There are many things you can do to make your home safe and to help prevent falls. ?What can I do on the outside of my home? ?Regularly fix the edges of walkways and driveways and fix any cracks. ?Remove anything that might make you trip as you walk through a door, such as a raised step or threshold. ?Trim any bushes or trees on the path to your home. ?Use bright outdoor lighting. ?Clear any walking paths of anything that might make someone trip, such as rocks or tools. ?Regularly check to see if handrails are loose or broken. Make sure that both sides of any steps have handrails. ?Any raised decks and porches should have guardrails on the edges. ?Have any leaves, snow, or ice cleared regularly. ?Use sand or salt on walking paths during winter. ?Clean up any spills in your garage right away. This includes oil or grease spills. ?What can I do in the bathroom? ?Use night lights. ?Install grab bars by the toilet and in the tub and shower. Do not use towel bars as grab bars. ?Use non-skid mats or decals in the tub or shower. ?If you need to sit down in the shower, use a plastic, non-slip stool. ?Keep the floor dry. Clean up any water that spills on the floor as soon as it happens. ?Remove soap buildup in the tub or shower regularly. ?Attach bath mats securely with double-sided non-slip rug tape. ?Do not have throw rugs and other things on the floor that can make you trip. ?What can I do in the bedroom? ?Use night lights. ?Make sure that you have a light by your bed that is easy to  reach. ?Do not use any sheets or blankets that are too big for your bed. They should not hang down onto the floor. ?Have a firm chair that has side arms. You can use this for support while you get dressed. ?Do not have throw rugs and other things on the floor that can make you trip. ?What can I do in the kitchen? ?Clean up any spills right away. ?Avoid walking on wet floors. ?Keep items that you use a lot in easy-to-reach places. ?If you need to reach something above you, use a strong step stool that has a grab bar. ?Keep electrical cords out of the way. ?Do not use floor polish or wax that makes floors slippery. If you must use wax, use non-skid floor wax. ?Do not have throw rugs and other things on the floor that can make you trip. ?What can I do with my stairs? ?Do not leave any items on the stairs. ?Make sure that there are handrails on both sides of the stairs and use them. Fix handrails that are broken or loose. Make sure that handrails are as long as the stairways. ?Check any carpeting to make sure that it is firmly attached to the stairs. Fix any carpet that is loose or worn. ?Avoid having throw rugs at the top or bottom of the stairs. If you do have throw rugs, attach them to the floor with carpet  tape. ?Make sure that you have a light switch at the top of the stairs and the bottom of the stairs. If you do not have them, ask someone to add them for you. ?What else can I do to help prevent falls? ?Wear shoes that: ?Do not have high heels. ?Have rubber bottoms. ?Are comfortable and fit you well. ?Are closed at the toe. Do not wear sandals. ?If you use a stepladder: ?Make sure that it is fully opened. Do not climb a closed stepladder. ?Make sure that both sides of the stepladder are locked into place. ?Ask someone to hold it for you, if possible. ?Clearly mark and make sure that you can see: ?Any grab bars or handrails. ?First and last steps. ?Where the edge of each step is. ?Use tools that help you move  around (mobility aids) if they are needed. These include: ?Canes. ?Walkers. ?Scooters. ?Crutches. ?Turn on the lights when you go into a dark area. Replace any light bulbs as soon as they burn out. ?Set up your f

## 2022-02-19 DIAGNOSIS — L931 Subacute cutaneous lupus erythematosus: Secondary | ICD-10-CM | POA: Diagnosis not present

## 2022-03-02 DIAGNOSIS — G4733 Obstructive sleep apnea (adult) (pediatric): Secondary | ICD-10-CM | POA: Diagnosis not present

## 2022-03-23 ENCOUNTER — Encounter: Payer: Self-pay | Admitting: Family

## 2022-03-24 ENCOUNTER — Other Ambulatory Visit: Payer: Self-pay | Admitting: Family

## 2022-03-24 MED ORDER — FINASTERIDE 5 MG PO TABS: 5.0000 mg | ORAL_TABLET | Freq: Every day | ORAL | 3 refills | Status: DC

## 2022-04-06 DIAGNOSIS — M25562 Pain in left knee: Secondary | ICD-10-CM | POA: Diagnosis not present

## 2022-04-06 DIAGNOSIS — L959 Vasculitis limited to the skin, unspecified: Secondary | ICD-10-CM | POA: Diagnosis not present

## 2022-04-06 DIAGNOSIS — M1990 Unspecified osteoarthritis, unspecified site: Secondary | ICD-10-CM | POA: Diagnosis not present

## 2022-04-06 DIAGNOSIS — M329 Systemic lupus erythematosus, unspecified: Secondary | ICD-10-CM | POA: Diagnosis not present

## 2022-04-06 DIAGNOSIS — R21 Rash and other nonspecific skin eruption: Secondary | ICD-10-CM | POA: Diagnosis not present

## 2022-04-10 ENCOUNTER — Ambulatory Visit: Payer: Medicare Other | Admitting: Rheumatology

## 2022-04-14 ENCOUNTER — Ambulatory Visit (INDEPENDENT_AMBULATORY_CARE_PROVIDER_SITE_OTHER): Payer: Medicare Other | Admitting: Family

## 2022-04-14 VITALS — BP 130/74 | HR 63 | Temp 97.5°F | Resp 16 | Ht 73.0 in | Wt 211.0 lb

## 2022-04-14 DIAGNOSIS — M329 Systemic lupus erythematosus, unspecified: Secondary | ICD-10-CM

## 2022-04-14 DIAGNOSIS — I10 Essential (primary) hypertension: Secondary | ICD-10-CM

## 2022-04-14 DIAGNOSIS — I4891 Unspecified atrial fibrillation: Secondary | ICD-10-CM | POA: Diagnosis not present

## 2022-04-29 ENCOUNTER — Ambulatory Visit: Payer: Medicare Other | Admitting: Rheumatology

## 2022-06-25 DIAGNOSIS — M79641 Pain in right hand: Secondary | ICD-10-CM | POA: Diagnosis not present

## 2022-06-25 DIAGNOSIS — M25562 Pain in left knee: Secondary | ICD-10-CM | POA: Diagnosis not present

## 2022-06-25 DIAGNOSIS — M329 Systemic lupus erythematosus, unspecified: Secondary | ICD-10-CM | POA: Diagnosis not present

## 2022-06-25 DIAGNOSIS — M79642 Pain in left hand: Secondary | ICD-10-CM | POA: Diagnosis not present

## 2022-06-25 DIAGNOSIS — M549 Dorsalgia, unspecified: Secondary | ICD-10-CM | POA: Diagnosis not present

## 2022-06-25 DIAGNOSIS — R21 Rash and other nonspecific skin eruption: Secondary | ICD-10-CM | POA: Diagnosis not present

## 2022-06-25 DIAGNOSIS — L959 Vasculitis limited to the skin, unspecified: Secondary | ICD-10-CM | POA: Diagnosis not present

## 2022-06-25 DIAGNOSIS — M1990 Unspecified osteoarthritis, unspecified site: Secondary | ICD-10-CM | POA: Diagnosis not present

## 2022-07-09 ENCOUNTER — Telehealth: Payer: Medicare Other | Admitting: Adult Health

## 2022-07-09 DIAGNOSIS — L931 Subacute cutaneous lupus erythematosus: Secondary | ICD-10-CM | POA: Diagnosis not present

## 2022-07-09 NOTE — Progress Notes (Signed)
PATIENT: [name] DOB: [birth year]  REASON FOR VISIT: follow up HISTORY FROM: patient  Virtual Visit via Video Note  I connected with [name] on 07/09/22 at  1:45 PM EDT by a video enabled telemedicine application and verified that I am speaking with the correct person using two identifiers.  Location: Patient: at home Provider: in office   I discussed the limitations of evaluation and management by telemedicine and the availability of in person appointments. The patient expressed understanding and agreed to proceed.    HISTORY OF PRESENT ILLNESS: [name] is a 71 y.o. male with PMH of CAD s/p PCI, RCEA, GERD, HLD, and HTN who is being followed in this office for OSA on CPAP management.  He was initially evaluated by Dr. Brett Fairy on 07/14/2018 with prior diagnosis of OSA on CPAP and due to recent diagnosis of atrial fibrillation, was referred for ongoing apnea management.  He underwent HST which confirmed mild OSA with AHI at 13 and recommended continuation of auto CPAP.    Update 07/13/2022 JM: Patient returns for yearly CPAP follow-up visit via [place] video visit.  He continues to do well on CPAP.  Continues to closely follow with his DME company and up-to-date on his supplies.  Continued high leak rate but not bothersome to patient.  No concerns at this time.       Update 04/07/2021 JM: Mr. Cedrone returns for yearly CPAP follow-up visit.  He reports doing well on CPAP tolerating without difficulty.  Prior complaints of frequent mask leak but this has resolved since obtaining a new mask.  He does still have elevated mask leak per report but this is not bothersome to the patient nor noticeable.  Epworth Sleepiness Scale 0.  No concerns at this time.          REVIEW OF SYSTEMS: Out of a complete 14 system review of symptoms, the patient complains only of the following symptoms, apnea and all other reviewed systems are negative.    ALLERGIES: Allergies  Allergen  Reactions   Felodipine Swelling    Legs became swollen   Niacin Other (See Comments)    Made patient feel flushed    HOME MEDICATIONS: Outpatient Medications Prior to Visit  Medication Sig Dispense Refill   amLODipine (NORVASC) 10 MG tablet TAKE 1 TABLET BY MOUTH  DAILY 90 tablet 3   b complex vitamins capsule Take 1 capsule by mouth daily.     docusate sodium (COLACE) 100 MG capsule Take 1 capsule (100 mg total) by mouth 2 (two) times daily. While taking narcotic pain medicine. 30 capsule 0   doxazosin (CARDURA) 2 MG tablet TAKE 1 TABLET BY MOUTH AT  BEDTIME 90 tablet 3   ELIQUIS 5 MG TABS tablet TAKE 1 TABLET BY MOUTH  TWICE DAILY 180 tablet 3   ezetimibe (ZETIA) 10 MG tablet TAKE 1 TABLET BY MOUTH  DAILY 90 tablet 3   finasteride (PROSCAR) 5 MG tablet Take 1 tablet (5 mg total) by mouth daily. 90 tablet 3   gabapentin (NEURONTIN) 300 MG capsule Take 3 capsules every night 90 capsule 11   hydrochlorothiazide (HYDRODIURIL) 25 MG tablet TAKE 1 TABLET BY MOUTH  DAILY 90 tablet 3   metoprolol tartrate (LOPRESSOR) 25 MG tablet TAKE 1 TABLET BY MOUTH  DAILY AS NEEDED FOR  PALPITATIONS AND AFIB 90 tablet 3   nitroGLYCERIN (NITROSTAT) 0.4 MG SL tablet Place 1 tablet (0.4 mg total) under the tongue every 5 (five) minutes  as needed. Call 9-1-1 if need more than 2. 25 tablet 0   pantoprazole (PROTONIX) 40 MG tablet TAKE 1 TABLET BY MOUTH  DAILY 90 tablet 3   potassium chloride (KLOR-CON M) 10 MEQ tablet TAKE 1 TABLET BY MOUTH DAILY 90 tablet 3   predniSONE (DELTASONE) 5 MG tablet 1 tablet     ramipril (ALTACE) 10 MG capsule TAKE 1 CAPSULE BY MOUTH TWICE  DAILY 180 capsule 3   ranolazine (RANEXA) 500 MG 12 hr tablet Take 1 tablet (500 mg total) by mouth 2 (two) times daily. 180 tablet 3   rosuvastatin (CRESTOR) 40 MG tablet TAKE 1 TABLET BY MOUTH  DAILY 90 tablet 3   tiZANidine (ZANAFLEX) 2 MG tablet Take one tablet at bedtime as needed for pain, if tolerating, ok to take twice daily as needed 90  tablet 3   No facility-administered medications prior to visit.    PAST MEDICAL HISTORY: Past Medical History:  Diagnosis Date   Acute left ankle pain 08/05/2018   AKI (acute kidney injury) ([place]) 06/05/2019   Atrial fibrillation (HCC)    Atrial fibrillation with RVR ([place]) 06/29/2019   Bradycardia    Bradycardia 04/26/2018   Carotid artery disease (HCC)    Right CEA;  dopplers 6/12:  0-39% bilat ICA   Coronary artery disease    s/p BMS to RCA 2002; [place] 2008: oLAD 40%, pRCA stent ok with 20%, EF 60%); Myoview scan in March 2011 which was negative for ischemia    Coronary atherosclerosis 12/25/2008   Qualifier: Diagnosis of  By: Fuller Plan MD Lamont Snowball T    COVID-19 virus infection 06/05/2019   Dizziness 04/26/2018   Essential hypertension 10/08/2007   Qualifier: Diagnosis of  By: Bieber, [place]     GASTROESOPHAGEAL REFLUX DISEASE 10/08/2007   Qualifier: Diagnosis of  By: Danny Lawless CMA, [place]     GERD (gastroesophageal reflux disease)    Hyperlipidemia    Hypersomnia with sleep apnea 12/05/2015   Hypertension    MYOCARDIAL INFARCTION 02/03/2008   Qualifier: History of  By: Doy Mince LPN, [place]     Myocardial infarction [facility])    Obstructive sleep apnea 02/03/2008   NPSG; 02/04/2008-mild OSA-RDI 5.1 per hour CPAP 11 cm H2O.        OSA on CPAP    Plantar fasciitis, bilateral 12/13/2012   Injection Right heel Feb '14    Preoperative clearance 04/26/2018   Syncope 06/05/2019   Typical atrial flutter ([place])    Unstable angina ([place]) 12/02/2015    PAST SURGICAL HISTORY: Past Surgical History:  Procedure Laterality Date   ACHILLES TENDON LENGTHENING Left 11/28/2020   Procedure: ACHILLES TENDON LENGTHENING;  Surgeon: Wylene Simmer, MD;  Location: [place];  Service: Orthopedics;  Laterality: Left;   ATRIAL FIBRILLATION ABLATION N/A 08/01/2019   Procedure: ATRIAL FIBRILLATION ABLATION;  Surgeon: Thompson Grayer, MD;  Location: [facility] [facility];  Service: Cardiovascular;   Laterality: N/A;   CARDIAC CATHETERIZATION  06/01/2007    EF of 65% -- Nonobstructive CAD with 40% ostial stenosis in the LAD, no significant obstruction in the circumflex artery, 20% narrowing within the stent in the proximal to mid right coronary and normal LV function   CARDIAC CATHETERIZATION  02/02/2006   EF of 50%   CARDIAC CATHETERIZATION  01/19/2002   EF of 60%   CARDIAC CATHETERIZATION N/A 12/03/2015   Procedure: Left Heart Cath and Coronary Angiography;  Surgeon: Burnell Blanks, MD;  Location: [facility];  Service: Cardiovascular;  Laterality: N/A;   CAROTID ENDARTERECTOMY  06/26/2002   right   CORONARY ANGIOPLASTY WITH STENT PLACEMENT  03/08/2000   Bare-metal stent in RCA, in [place] ARTHROSCOPY     right   METATARSAL OSTEOTOMY Left 11/28/2020   Procedure: Plantar fascial release and First metatarsal dorsiflexion osteotomy;  Surgeon: Wylene Simmer, MD;  Location: [facility];  Service: Orthopedics;  Laterality: Left;   MOLE REMOVAL     Prior moles removed   TOTAL ANKLE ARTHROPLASTY Left 11/28/2020   Procedure: Left total ankle arthroplasty;  Surgeon: Wylene Simmer, MD;  Location: [facility];  Service: Orthopedics;  Laterality: Left;    FAMILY HISTORY: Family History  Problem Relation Age of Onset   Heart failure Father    Heart disease Father    Peripheral vascular disease Mother        with pacemaker, and had a carotid endarterectomy   Heart disease Mother    Hypertension Brother    Hypertension Brother     SOCIAL HISTORY: Social History   Socioeconomic History   Marital status: Married    Spouse name: Not on file   Number of children: 3   Years of education: 12   Highest education level: High school graduate  Occupational History   Occupation: Architect  Tobacco Use   Smoking status: Former    Packs/day: 1.00    Years: 30.00    Total pack years: 30.00    Types: Cigarettes    Quit date: 10/19/2000     Years since quitting: 21.7   Smokeless tobacco: Current    Types: Snuff   Tobacco comments:    Dips snuff.   Vaping Use   Vaping Use: Never used  Substance and Sexual Activity   Alcohol use: Yes    Alcohol/week: 1.0 standard drink of alcohol    Types: 1 Cans of beer per week    Comment: Occasional beer   Drug use: No   Sexual activity: Yes  Other Topics Concern   Not on file  Social History Narrative   Lives with wife in a one story home.  Has 3 children.  Works in Architect.  Education: high school. Patient is right handed.   Social Determinants of Health   Financial Resource Strain: Low Risk  (02/18/2022)   Overall Financial Resource Strain (CARDIA)    Difficulty of Paying Living Expenses: Not hard at all  Food Insecurity: No Food Insecurity (02/18/2022)   Hunger Vital Sign    Worried About Running Out of Food in the Last Year: Never true    Ran Out of Food in the Last Year: Never true  Transportation Needs: No Transportation Needs (02/18/2022)   PRAPARE - Hydrologist (Medical): No    Lack of Transportation (Non-Medical): No  Physical Activity: Sufficiently Active (02/18/2022)   Exercise Vital Sign    Days of Exercise per Week: 7 days    Minutes of Exercise per Session: 60 min  Stress: No Stress Concern Present (02/18/2022)   [place]    Feeling of Stress : Not at all  Social Connections: Moderately Integrated (02/18/2022)   Social Connection and Isolation Panel [NHANES]    Frequency of Communication with Friends and Family: More than three times a week    Frequency of Social Gatherings with Friends and Family: More than three times a week    Attends Religious Services: Never  Active Member of Clubs or Organizations: Yes    Attends Archivist Meetings: More than 4 times per year    Marital Status: Married  Human resources officer Violence: Not At Risk (02/18/2022)    Humiliation, Afraid, Rape, and Kick questionnaire    Fear of Current or Ex-Partner: No    Emotionally Abused: No    Physically Abused: No    Sexually Abused: No      PHYSICAL EXAM N/A d/t visit type      ASSESSMENT AND PLAN 71 y.o. year old male  has a past medical history of Acute left ankle pain (08/05/2018), AKI (acute kidney injury) ([place]) (06/05/2019), Atrial fibrillation ([place]), Atrial fibrillation with RVR ([place]) (06/29/2019), Bradycardia, Bradycardia (04/26/2018), Carotid artery disease ([place]), Coronary artery disease, Coronary atherosclerosis (12/25/2008), COVID-19 virus infection (06/05/2019), Dizziness (04/26/2018), Essential hypertension (10/08/2007), GASTROESOPHAGEAL REFLUX DISEASE (10/08/2007), GERD (gastroesophageal reflux disease), Hyperlipidemia, Hypersomnia with sleep apnea (12/05/2015), Hypertension, MYOCARDIAL INFARCTION (02/03/2008), Myocardial infarction ([place]), Obstructive sleep apnea (02/03/2008), OSA on CPAP, Plantar fasciitis, bilateral (12/13/2012), Preoperative clearance (04/26/2018), Syncope (06/05/2019), Typical atrial flutter ([place]), and Unstable angina ([place]) (12/02/2015). here for follow up regarding use of CPAP for sleep apnea management.    Continue current settings of min pressure 7 and max pressure 13 with EPR level 1 as apnea continues to be well managed.   Elevated mask leak but as not bothersome to patient and apnea well-controlled, will continue to monitor  He also continue to follow with DME company [facility] for any supplies or CPAP related concerns.   Follow-up in 1 year or earlier if needed   CC: [name], FNP   I spent 21 minutes of face-to-face and non-face-to-face time with patient.  This included previsit chart review, CPAP download review, order entry, electronic health record documentation, and patient education regarding importance of ongoing nightly use of CPAP for continued optimal management of apnea and answered all other questions to patient's  satisfaction  Frann Rider, [facility]  [facility] [address] [place], [place] [address]  Phone [phone] Fax [phone] Note: This document was prepared with digital dictation and possible smart phrase technology. Any transcriptional errors that result from this process are unintentional.

## 2022-07-11 ENCOUNTER — Other Ambulatory Visit: Payer: Self-pay | Admitting: Cardiovascular Disease

## 2022-07-13 ENCOUNTER — Encounter: Payer: Self-pay | Admitting: Adult Health

## 2022-07-13 ENCOUNTER — Telehealth (INDEPENDENT_AMBULATORY_CARE_PROVIDER_SITE_OTHER): Payer: Medicare Other | Admitting: Adult Health

## 2022-07-13 DIAGNOSIS — G4733 Obstructive sleep apnea (adult) (pediatric): Secondary | ICD-10-CM | POA: Diagnosis not present

## 2022-07-13 DIAGNOSIS — Z9989 Dependence on other enabling machines and devices: Secondary | ICD-10-CM | POA: Diagnosis not present

## 2022-07-13 NOTE — Progress Notes (Signed)
CPAP orders faxed to DME: [facility] Patient on file.

## 2022-09-18 LAB — PSA: PSA: 1.24

## 2022-10-04 ENCOUNTER — Other Ambulatory Visit: Payer: Self-pay | Admitting: Family

## 2022-12-02 ENCOUNTER — Encounter: Payer: Self-pay | Admitting: Cardiovascular Disease

## 2022-12-02 DIAGNOSIS — M329 Systemic lupus erythematosus, unspecified: Secondary | ICD-10-CM | POA: Diagnosis not present

## 2022-12-02 DIAGNOSIS — R21 Rash and other nonspecific skin eruption: Secondary | ICD-10-CM | POA: Diagnosis not present

## 2022-12-02 DIAGNOSIS — M1990 Unspecified osteoarthritis, unspecified site: Secondary | ICD-10-CM | POA: Diagnosis not present

## 2022-12-02 DIAGNOSIS — L959 Vasculitis limited to the skin, unspecified: Secondary | ICD-10-CM | POA: Diagnosis not present

## 2022-12-02 DIAGNOSIS — M549 Dorsalgia, unspecified: Secondary | ICD-10-CM | POA: Diagnosis not present

## 2022-12-02 DIAGNOSIS — M25562 Pain in left knee: Secondary | ICD-10-CM | POA: Diagnosis not present

## 2022-12-04 NOTE — Telephone Encounter (Signed)
Left messages on patient's and daughter's VM.  If available, he can come for appointment with Dr. Angelena Form on 12/14/22 at 10:40 am. Also communicated this through the patient portal.

## 2022-12-05 DIAGNOSIS — G4733 Obstructive sleep apnea (adult) (pediatric): Secondary | ICD-10-CM | POA: Diagnosis not present

## 2022-12-06 ENCOUNTER — Other Ambulatory Visit: Payer: Self-pay | Admitting: Family

## 2022-12-08 LAB — LAB REPORT - SCANNED: EGFR: 65

## 2022-12-11 DIAGNOSIS — S61451A Open bite of right hand, initial encounter: Secondary | ICD-10-CM | POA: Diagnosis not present

## 2022-12-11 DIAGNOSIS — W540XXA Bitten by dog, initial encounter: Secondary | ICD-10-CM | POA: Diagnosis not present

## 2022-12-12 DIAGNOSIS — L03113 Cellulitis of right upper limb: Secondary | ICD-10-CM | POA: Diagnosis not present

## 2022-12-12 DIAGNOSIS — W540XXA Bitten by dog, initial encounter: Secondary | ICD-10-CM | POA: Diagnosis not present

## 2022-12-21 ENCOUNTER — Other Ambulatory Visit: Payer: Self-pay | Admitting: Family

## 2022-12-22 ENCOUNTER — Other Ambulatory Visit: Payer: Self-pay | Admitting: Family

## 2022-12-22 DIAGNOSIS — I10 Essential (primary) hypertension: Secondary | ICD-10-CM

## 2022-12-31 ENCOUNTER — Other Ambulatory Visit: Payer: Self-pay | Admitting: Family

## 2023-01-07 ENCOUNTER — Other Ambulatory Visit: Payer: Self-pay | Admitting: Cardiovascular Disease

## 2023-01-07 ENCOUNTER — Other Ambulatory Visit: Payer: Self-pay | Admitting: Family

## 2023-01-07 DIAGNOSIS — L821 Other seborrheic keratosis: Secondary | ICD-10-CM | POA: Diagnosis not present

## 2023-01-07 DIAGNOSIS — L570 Actinic keratosis: Secondary | ICD-10-CM | POA: Diagnosis not present

## 2023-01-07 DIAGNOSIS — L931 Subacute cutaneous lupus erythematosus: Secondary | ICD-10-CM | POA: Diagnosis not present

## 2023-01-07 DIAGNOSIS — I10 Essential (primary) hypertension: Secondary | ICD-10-CM

## 2023-01-07 DIAGNOSIS — L814 Other melanin hyperpigmentation: Secondary | ICD-10-CM | POA: Diagnosis not present

## 2023-01-11 NOTE — Progress Notes (Signed)
Cardiology Office Note:    Date:  01/20/2023   ID:  [name], DOB Mar 17, 1951, MRN [id number]  PCP:  [name], [place] [facility] Cardiologist:  Lauree Chandler, MD     Referring MD: [name],*   Chief Complaint:  Follow-up     History of Present Illness:   [name] is a 72 y.o. male with   history of PAF, sleep apnea, CAD, HTN, hyperlipidemia and carotid artery disease s/p right CEA    In 2002 he had a bare metal stent placed in the right coronary artery. A catheterization in 2008 showed non-obstructive disease. Had a Myoview scan in March 2011 which was negative for ischemia. Cardiac cath February 2017 with patent RCA stent and mild non-obstructive disease in the RCA, Circumflex and LAD. Nuclear stress test July 2019 with no ischemia. Echo August 2019 with [id number], grade 2 diastolic dysfunction. No significant valve disease. Carotid artery dopplers 2021 with mild bilateral carotid artery disease. Cardiac monitor in 2019 with nocturnal sinus bradycardia and short 3 second runs of SVT. He was admitted to [facility] August 2019 with atrial fibrillation with rapid ventricular response and was started on Eliquis. He converted to sinus in the ED after one dose of IV metoprolol. He underwent atrial fibrillation ablation in October 2020. Echo August 2020 with LVEF=60-65%. He has not been on a beta blocker due to bradycardia.   Patient last saw Dr. Angelena Form 2023 and doing well.  Patient comes in with his wife for yearly f/u. Denies chest pain, dyspnea, palpitations. Does fishing, horseshoes, corn hole. No regular exercise but stays busy.  Hasn't had to use metoprolol prn.  Labs reviewed in results. Doesn't smoke but does dip tobacco.      Past Medical History:  Diagnosis Date   Acute left ankle pain 08/05/2018   AKI (acute kidney injury) 06/05/2019   Atrial fibrillation    Atrial fibrillation with RVR 06/29/2019   Bradycardia    Bradycardia  04/26/2018   Carotid artery disease    Right CEA;  dopplers 6/12:  0-39% bilat ICA   Coronary artery disease    s/p BMS to RCA 2002; [place] 2008: oLAD 40%, pRCA stent ok with 20%, EF 60%); Myoview scan in March 2011 which was negative for ischemia    Coronary atherosclerosis 12/25/2008   Qualifier: Diagnosis of  By: Fuller Plan MD Lamont Snowball T    COVID-19 virus infection 06/05/2019   Dizziness 04/26/2018   Essential hypertension 10/08/2007   Qualifier: Diagnosis of  By: [place], [place]     GASTROESOPHAGEAL REFLUX DISEASE 10/08/2007   Qualifier: Diagnosis of  By: Danny Lawless CMA, [place]     GERD (gastroesophageal reflux disease)    Hyperlipidemia    Hypersomnia with sleep apnea 12/05/2015   Hypertension    Myocardial infarction    MYOCARDIAL INFARCTION 02/03/2008   Qualifier: History of  By: Doy Mince LPN, [place]     Obstructive sleep apnea 02/03/2008   NPSG; 02/04/2008-mild OSA-RDI 5.1 per hour CPAP 11 cm H2O.        OSA on CPAP    Plantar fasciitis, bilateral 12/13/2012   Injection Right heel Feb '14    Preoperative clearance 04/26/2018   Syncope 06/05/2019   Typical atrial flutter    Unstable angina 12/02/2015   Current Medications: Current Meds  Medication Sig   amLODipine (NORVASC) 10 MG tablet TAKE 1 TABLET BY MOUTH DAILY   b complex vitamins capsule Take 1 capsule by mouth  daily.   docusate sodium (COLACE) 100 MG capsule Take 1 capsule (100 mg total) by mouth 2 (two) times daily. While taking narcotic pain medicine.   doxazosin (CARDURA) 2 MG tablet TAKE 1 TABLET BY MOUTH AT  BEDTIME   ELIQUIS 5 MG TABS tablet TAKE 1 TABLET BY MOUTH TWICE  DAILY   ezetimibe (ZETIA) 10 MG tablet TAKE 1 TABLET BY MOUTH DAILY   finasteride (PROSCAR) 5 MG tablet TAKE 1 TABLET BY MOUTH DAILY   gabapentin (NEURONTIN) 300 MG capsule Take 3 capsules every night   hydrochlorothiazide (HYDRODIURIL) 25 MG tablet TAKE 1 TABLET BY MOUTH DAILY   metoprolol tartrate (LOPRESSOR) 25 MG tablet TAKE 1 TABLET BY MOUTH DAILY AS   NEEDED FOR PALPITATIONS AND AFIB   nitroGLYCERIN (NITROSTAT) 0.4 MG SL tablet Place 1 tablet (0.4 mg total) under the tongue every 5 (five) minutes as needed. Call 9-1-1 if need more than 2.   pantoprazole (PROTONIX) 40 MG tablet TAKE 1 TABLET BY MOUTH DAILY   potassium chloride (KLOR-CON M) 10 MEQ tablet TAKE 1 TABLET BY MOUTH DAILY   predniSONE (DELTASONE) 5 MG tablet 1 tablet   predniSONE (DELTASONE) 5 MG tablet Take 5 mg by mouth. Every other day   promethazine (PHENERGAN) 12.5 MG tablet Take 1 tablet by mouth every 6 (six) hours as needed.   ramipril (ALTACE) 10 MG capsule TAKE 1 CAPSULE BY MOUTH TWICE  DAILY   ranolazine (RANEXA) 500 MG 12 hr tablet Take 1 tablet (500 mg total) by mouth 2 (two) times daily.   rosuvastatin (CRESTOR) 40 MG tablet TAKE 1 TABLET BY MOUTH DAILY   tiZANidine (ZANAFLEX) 2 MG tablet TAKE 1 TABLET BY MOUTH AT  BEDTIME AS NEEDED FOR PAIN IF  TOLERATING, OK TO TAKE TWICE  DAILY AS NEEDED    Allergies:   Felodipine and Niacin   Social History   Tobacco Use   Smoking status: Former    Packs/day: 1.00    Years: 30.00    Additional pack years: 0.00    Total pack years: 30.00    Types: Cigarettes    Quit date: 10/19/2000    Years since quitting: 22.2   Smokeless tobacco: Current    Types: Snuff   Tobacco comments:    Dips snuff.   Vaping Use   Vaping Use: Never used  Substance Use Topics   Alcohol use: Yes    Alcohol/week: 1.0 standard drink of alcohol    Types: 1 Cans of beer per week    Comment: Occasional beer   Drug use: No    Family Hx: The patient's family history includes Heart disease in his father and mother; Heart failure in his father; Hypertension in his brother and brother; Peripheral vascular disease in his mother.  ROS     Physical Exam:    VS:  BP 116/70   Pulse 60   Ht 6\' 1"  (1.854 m)   Wt 212 lb (96.2 kg)   SpO2 96%   BMI 27.97 kg/m     Wt Readings from Last 3 Encounters:  01/20/23 212 lb (96.2 kg)  04/14/22 211 lb (95.7  kg)  11/24/21 207 lb 12.8 oz (94.3 kg)    Physical Exam  GEN: Well nourished, well developed, in no acute distress  Neck: no JVD, carotid bruits, or masses Cardiac:RRR; no murmurs, rubs, or gallops  Respiratory:  clear to auscultation bilaterally, normal work of breathing GI: soft, nontender, nondistended, + BS Ext: without cyanosis, clubbing, or edema, Good  distal pulses bilaterally Neuro:  Alert and Oriented x 3,  Psych: euthymic mood, full affect        EKGs/Labs/Other Test Reviewed:    EKG:  EKG is   ordered today.  The ekg ordered today demonstrates NSR normal EKG  Recent Labs: No results found for requested labs within last 365 days.   Recent Lipid Panel No results for input(s): "CHOL", "TRIG", "HDL", "VLDL", "LDLCALC", "LDLDIRECT" in the last 8760 hours.   Prior CV Studies:  Risk Assessment/Calculations/Metrics:    CHA2DS2-VASc Score = 3   This indicates a 3.2% annual risk of stroke. The patient's score is based upon: CHF History: 0 HTN History: 1 Diabetes History: 0 Stroke History: 0 Vascular Disease History: 1 Age Score: 1 Gender Score: 0             ASSESSMENT & PLAN:   No problem-specific Assessment & Plan notes found for this encounter.   CAD BMS to the RCA in 2002 2008 nonobstructive disease, Myoview 2011 no ischemia, cath 2017 patent RCA stent and mild nonobstructive disease in the RCA circumflex and LAD, NST 2019 no ischemia- no recent angina.  Recommend 150 minutes of exercise weekly.   PAF status post A. fib ablation 07/2019 remains on Eliquis-no bleeding problems and labs scanned into chart reviewed and are normal-Crt and Hbg    Hypertension blood pressure controlled   OSA uses CPAP every night   HLD check lipids in [place] .  On Crestor and Zetia   Carotid disease status post R CEA stable in 2022 needs follow-up carotid Dopplers   CKD stage III renal normal on recent labs   History of bradycardia resolved with stopping atenolol    Tobacco abuse-dips tobacco. Recommend quitting           Dispo:  No follow-ups on file.   Medication Adjustments/Labs and Tests Ordered: Current medicines are reviewed at length with the patient today.  Concerns regarding medicines are outlined above.  Tests Ordered: Orders Placed This Encounter  Procedures   Lipid panel   EKG 12-Lead   VAS US CAROTID   Medication Changes: No orders of the defined types were placed in this encounter.  Signed, Ermalinda Barrios, PA-C  01/20/2023 1:26 PM    [address], [place], [place]  [address] Phone: [phone]; Fax: [phone]

## 2023-01-20 ENCOUNTER — Encounter: Payer: Self-pay | Admitting: Physician Assistant

## 2023-01-20 ENCOUNTER — Ambulatory Visit: Payer: Medicare Other | Attending: Physician Assistant | Admitting: Physician Assistant

## 2023-01-20 VITALS — BP 116/70 | HR 60 | Ht 73.0 in | Wt 212.0 lb

## 2023-01-20 DIAGNOSIS — I6523 Occlusion and stenosis of bilateral carotid arteries: Secondary | ICD-10-CM

## 2023-01-20 DIAGNOSIS — I480 Paroxysmal atrial fibrillation: Secondary | ICD-10-CM

## 2023-01-20 DIAGNOSIS — R001 Bradycardia, unspecified: Secondary | ICD-10-CM | POA: Diagnosis not present

## 2023-01-20 DIAGNOSIS — G4733 Obstructive sleep apnea (adult) (pediatric): Secondary | ICD-10-CM

## 2023-01-20 DIAGNOSIS — I2510 Atherosclerotic heart disease of native coronary artery without angina pectoris: Secondary | ICD-10-CM

## 2023-01-20 DIAGNOSIS — I10 Essential (primary) hypertension: Secondary | ICD-10-CM | POA: Diagnosis not present

## 2023-01-20 DIAGNOSIS — Z720 Tobacco use: Secondary | ICD-10-CM

## 2023-01-20 DIAGNOSIS — E782 Mixed hyperlipidemia: Secondary | ICD-10-CM | POA: Diagnosis not present

## 2023-01-20 NOTE — Patient Instructions (Addendum)
Medication Instructions:  Your physician recommends that you continue on your current medications as directed. Please refer to the Current Medication list given to you today.   *If you need a refill on your cardiac medications before your next appointment, please call your pharmacy*   Lab Work: Your physician recommends that you return for a FASTING lipid profile at your earliest convenience. You may have these drawn at the [facility] in [place]. Their office is open from 8 am to 5 pm. No appointment needed   [address] [place], [place] [address]   If you have labs (blood work) drawn today and your tests are completely normal, you will receive your results only by: [place] (if you have MyChart) OR A paper copy in the mail If you have any lab test that is abnormal or we need to change your treatment, we will call you to review the results.   Testing/Procedures: Your physician has requested that you have a carotid duplex. This test is an ultrasound of the carotid arteries in your neck. It looks at blood flow through these arteries that supply the brain with blood. Allow one hour for this exam. There are no restrictions or special instructions.    Follow-Up: At [facility], you and your health needs are our priority.  As part of our continuing mission to provide you with exceptional heart care, we have created designated Provider Care Teams.  These Care Teams include your primary Cardiologist (physician) and Advanced Practice Providers (APPs -  Physician Assistants and Nurse Practitioners) who all work together to provide you with the care you need, when you need it.  We recommend signing up for the patient portal called "MyChart".  Sign up information is provided on this After Visit Summary.  MyChart is used to connect with patients for Virtual Visits (Telemedicine).  Patients are able to view lab/test results, encounter notes, upcoming appointments, etc.  Non-urgent messages  can be sent to your provider as well.   To learn more about what you can do with MyChart, go to [email or web address].    Your next appointment:   12 month(s)  Provider:   Lauree Chandler, MD     Other Instructions Your physician recommends that you get 150 minutes of exercise a week    Your physician recommends that you STOP dipping

## 2023-01-24 ENCOUNTER — Encounter: Payer: Self-pay | Admitting: Cardiovascular Disease

## 2023-02-02 ENCOUNTER — Ambulatory Visit: Payer: Medicare Other | Attending: Physician Assistant

## 2023-02-02 ENCOUNTER — Other Ambulatory Visit: Payer: Self-pay

## 2023-02-02 DIAGNOSIS — I6523 Occlusion and stenosis of bilateral carotid arteries: Secondary | ICD-10-CM | POA: Diagnosis not present

## 2023-02-02 DIAGNOSIS — E782 Mixed hyperlipidemia: Secondary | ICD-10-CM

## 2023-02-03 LAB — LIPID PANEL
Chol/HDL Ratio: 3.2 ratio (ref 0.0–5.0)
Cholesterol, Total: 123 mg/dL (ref 100–199)
HDL: 38 mg/dL — ABNORMAL LOW (ref >39)
LDL Chol Calc (NIH): 61 mg/dL (ref 0–99)
Triglycerides: 139 mg/dL (ref 0–149)
VLDL Cholesterol Cal: 24 mg/dL (ref 5–40)

## 2023-02-23 ENCOUNTER — Ambulatory Visit (INDEPENDENT_AMBULATORY_CARE_PROVIDER_SITE_OTHER): Payer: Medicare Other | Admitting: *Deleted

## 2023-02-23 ENCOUNTER — Ambulatory Visit: Payer: Medicare Other | Admitting: Family

## 2023-02-23 DIAGNOSIS — Z0000 Encounter for general adult medical examination without abnormal findings: Secondary | ICD-10-CM | POA: Diagnosis not present

## 2023-02-23 NOTE — Patient Instructions (Signed)
[name] , Thank you for taking time to come for your Medicare Wellness Visit. I appreciate your ongoing commitment to your health goals. Please review the following plan we discussed and let me know if I can assist you in the future.   These are the goals we discussed:  Goals   None     This is a list of the screening recommended for you and due dates:  Health Maintenance  Topic Date Due   Pneumonia Vaccine (1 of 2 - PCV) Never done   Zoster (Shingles) Vaccine (1 of 2) Never done   COVID-19 Vaccine (3 - Pfizer risk series) 12/20/2020   Flu Shot  12/05/2035*   Cologuard (Stool DNA test)  11/01/2023   Medicare Annual Wellness Visit  02/23/2024   DTaP/Tdap/Td vaccine (2 - Td or Tdap) 04/02/2025   Hepatitis C Screening: USPSTF Recommendation to screen - Ages 38-79 yo.  Completed   HPV Vaccine  Aged Out  *Topic was postponed. The date shown is not the original due date.     Next appointment: Follow up in one year for your annual wellness visit.   Preventive Care 70 Years and Older, Male Preventive care refers to lifestyle choices and visits with your health care provider that can promote health and wellness. What does preventive care include? A yearly physical exam. This is also called an annual well check. Dental exams once or twice a year. Routine eye exams. Ask your health care provider how often you should have your eyes checked. Personal lifestyle choices, including: Daily care of your teeth and gums. Regular physical activity. Eating a healthy diet. Avoiding tobacco and drug use. Limiting alcohol use. Practicing safe sex. Taking low doses of aspirin every day. Taking vitamin and mineral supplements as recommended by your health care provider. What happens during an annual well check? The services and screenings done by your health care provider during your annual well check will depend on your age, overall health, lifestyle risk factors, and family history of  disease. Counseling  Your health care provider may ask you questions about your: Alcohol use. Tobacco use. Drug use. Emotional well-being. Home and relationship well-being. Sexual activity. Eating habits. History of falls. Memory and ability to understand (cognition). Work and work Astronomer. Screening  You may have the following tests or measurements: Height, weight, and BMI. Blood pressure. Lipid and cholesterol levels. These may be checked every 5 years, or more frequently if you are over 60 years old. Skin check. Lung cancer screening. You may have this screening every year starting at age 40 if you have a 30-pack-year history of smoking and currently smoke or have quit within the past 15 years. Fecal occult blood test (FOBT) of the stool. You may have this test every year starting at age 35. Flexible sigmoidoscopy or colonoscopy. You may have a sigmoidoscopy every 5 years or a colonoscopy every 10 years starting at age 40. Prostate cancer screening. Recommendations will vary depending on your family history and other risks. Hepatitis C blood test. Hepatitis B blood test. Sexually transmitted disease (STD) testing. Diabetes screening. This is done by checking your blood sugar (glucose) after you have not eaten for a while (fasting). You may have this done every 1-3 years. Abdominal aortic aneurysm (AAA) screening. You may need this if you are a current or former smoker. Osteoporosis. You may be screened starting at age 61 if you are at high risk. Talk with your health care provider about your test results, treatment  options, and if necessary, the need for more tests. Vaccines  Your health care provider may recommend certain vaccines, such as: Influenza vaccine. This is recommended every year. Tetanus, diphtheria, and acellular pertussis (Tdap, Td) vaccine. You may need a Td booster every 10 years. Zoster vaccine. You may need this after age 52. Pneumococcal 13-valent  conjugate (PCV13) vaccine. One dose is recommended after age 58. Pneumococcal polysaccharide (PPSV23) vaccine. One dose is recommended after age 31. Talk to your health care provider about which screenings and vaccines you need and how often you need them. This information is not intended to replace advice given to you by your health care provider. Make sure you discuss any questions you have with your health care provider. Document Released: 11/01/2015 Document Revised: 06/24/2016 Document Reviewed: 08/06/2015 Elsevier Interactive Patient Education  2017 [employer] Prevention in the Home Falls can cause injuries. They can happen to people of all ages. There are many things you can do to make your home safe and to help prevent falls. What can I do on the outside of my home? Regularly fix the edges of walkways and driveways and fix any cracks. Remove anything that might make you trip as you walk through a door, such as a raised step or threshold. Trim any bushes or trees on the path to your home. Use bright outdoor lighting. Clear any walking paths of anything that might make someone trip, such as rocks or tools. Regularly check to see if handrails are loose or broken. Make sure that both sides of any steps have handrails. Any raised decks and porches should have guardrails on the edges. Have any leaves, snow, or ice cleared regularly. Use sand or salt on walking paths during winter. Clean up any spills in your garage right away. This includes oil or grease spills. What can I do in the bathroom? Use night lights. Install grab bars by the toilet and in the tub and shower. Do not use towel bars as grab bars. Use non-skid mats or decals in the tub or shower. If you need to sit down in the shower, use a plastic, non-slip stool. Keep the floor dry. Clean up any water that spills on the floor as soon as it happens. Remove soap buildup in the tub or shower regularly. Attach bath mats  securely with double-sided non-slip rug tape. Do not have throw rugs and other things on the floor that can make you trip. What can I do in the bedroom? Use night lights. Make sure that you have a light by your bed that is easy to reach. Do not use any sheets or blankets that are too big for your bed. They should not hang down onto the floor. Have a firm chair that has side arms. You can use this for support while you get dressed. Do not have throw rugs and other things on the floor that can make you trip. What can I do in the kitchen? Clean up any spills right away. Avoid walking on wet floors. Keep items that you use a lot in easy-to-reach places. If you need to reach something above you, use a strong step stool that has a grab bar. Keep electrical cords out of the way. Do not use floor polish or wax that makes floors slippery. If you must use wax, use non-skid floor wax. Do not have throw rugs and other things on the floor that can make you trip. What can I do with my stairs? Do not  leave any items on the stairs. Make sure that there are handrails on both sides of the stairs and use them. Fix handrails that are broken or loose. Make sure that handrails are as long as the stairways. Check any carpeting to make sure that it is firmly attached to the stairs. Fix any carpet that is loose or worn. Avoid having throw rugs at the top or bottom of the stairs. If you do have throw rugs, attach them to the floor with carpet tape. Make sure that you have a light switch at the top of the stairs and the bottom of the stairs. If you do not have them, ask someone to add them for you. What else can I do to help prevent falls? Wear shoes that: Do not have high heels. Have rubber bottoms. Are comfortable and fit you well. Are closed at the toe. Do not wear sandals. If you use a stepladder: Make sure that it is fully opened. Do not climb a closed stepladder. Make sure that both sides of the stepladder  are locked into place. Ask someone to hold it for you, if possible. Clearly mark and make sure that you can see: Any grab bars or handrails. First and last steps. Where the edge of each step is. Use tools that help you move around (mobility aids) if they are needed. These include: Canes. Walkers. Scooters. Crutches. Turn on the lights when you go into a dark area. Replace any light bulbs as soon as they burn out. Set up your furniture so you have a clear path. Avoid moving your furniture around. If any of your floors are uneven, fix them. If there are any pets around you, be aware of where they are. Review your medicines with your doctor. Some medicines can make you feel dizzy. This can increase your chance of falling. Ask your doctor what other things that you can do to help prevent falls. This information is not intended to replace advice given to you by your health care provider. Make sure you discuss any questions you have with your health care provider. Document Released: 08/01/2009 Document Revised: 03/12/2016 Document Reviewed: 11/09/2014 Elsevier Interactive Patient Education  2017 [employer].

## 2023-02-23 NOTE — Progress Notes (Signed)
Subjective:   [name] is a 72 y.o. male who presents for Medicare Annual/Subsequent preventive examination.  I connected with  [name] on 02/23/23 by a audio enabled telemedicine application and verified that I am speaking with the correct person using two identifiers.  Patient Location: Home  Provider Location: Office/Clinic  I discussed the limitations of evaluation and management by telemedicine. The patient expressed understanding and agreed to proceed.   Review of Systems     Cardiac Risk Factors include: advanced age (>16men, >31 women);male gender;dyslipidemia;hypertension     Objective:    There were no vitals filed for this visit. There is no height or weight on file to calculate BMI.     02/23/2023    9:49 AM 02/18/2022    9:47 AM 11/28/2020    6:33 AM 11/21/2020    2:17 PM 04/01/2020   12:56 PM 08/01/2019    9:18 AM 06/29/2019   10:27 AM  Advanced Directives  Does Patient Have a Medical Advance Directive? Yes No Yes Yes No Yes Yes  Type of Estate agent of [place];Living will  Healthcare Power of [place];Living will   Living will Living will  Does patient want to make changes to medical advance directive?   No - Patient declined No - Patient declined  No - Patient declined No - Patient declined  Copy of Healthcare Power of Attorney in Chart? No - copy requested  No - copy requested      Would patient like information on creating a medical advance directive?  No - Patient declined     No - Patient declined    Current Medications (verified) Outpatient Encounter Medications as of 02/23/2023  Medication Sig   amLODipine (NORVASC) 10 MG tablet TAKE 1 TABLET BY MOUTH DAILY   b complex vitamins capsule Take 1 capsule by mouth daily.   docusate sodium (COLACE) 100 MG capsule Take 1 capsule (100 mg total) by mouth 2 (two) times daily. While taking narcotic pain medicine.   doxazosin (CARDURA) 2 MG tablet TAKE 1 TABLET BY MOUTH AT  BEDTIME    ELIQUIS 5 MG TABS tablet TAKE 1 TABLET BY MOUTH TWICE  DAILY   ezetimibe (ZETIA) 10 MG tablet TAKE 1 TABLET BY MOUTH DAILY   finasteride (PROSCAR) 5 MG tablet TAKE 1 TABLET BY MOUTH DAILY   gabapentin (NEURONTIN) 300 MG capsule Take 3 capsules every night   hydrochlorothiazide (HYDRODIURIL) 25 MG tablet TAKE 1 TABLET BY MOUTH DAILY   metoprolol tartrate (LOPRESSOR) 25 MG tablet TAKE 1 TABLET BY MOUTH DAILY AS  NEEDED FOR PALPITATIONS AND AFIB   nitroGLYCERIN (NITROSTAT) 0.4 MG SL tablet Place 1 tablet (0.4 mg total) under the tongue every 5 (five) minutes as needed. Call 9-1-1 if need more than 2.   pantoprazole (PROTONIX) 40 MG tablet TAKE 1 TABLET BY MOUTH DAILY   potassium chloride (KLOR-CON M) 10 MEQ tablet TAKE 1 TABLET BY MOUTH DAILY   predniSONE (DELTASONE) 5 MG tablet 1 tablet   predniSONE (DELTASONE) 5 MG tablet Take 5 mg by mouth. Every other day   promethazine (PHENERGAN) 12.5 MG tablet Take 1 tablet by mouth every 6 (six) hours as needed.   ramipril (ALTACE) 10 MG capsule TAKE 1 CAPSULE BY MOUTH TWICE  DAILY   ranolazine (RANEXA) 500 MG 12 hr tablet Take 1 tablet (500 mg total) by mouth 2 (two) times daily.   rosuvastatin (CRESTOR) 40 MG tablet TAKE 1 TABLET BY MOUTH DAILY  tiZANidine (ZANAFLEX) 2 MG tablet TAKE 1 TABLET BY MOUTH AT  BEDTIME AS NEEDED FOR PAIN IF  TOLERATING, OK TO TAKE TWICE  DAILY AS NEEDED   No facility-administered encounter medications on file as of 02/23/2023.    Allergies (verified) Felodipine and Niacin   History: Past Medical History:  Diagnosis Date   Acute left ankle pain 08/05/2018   AKI (acute kidney injury) (HCC) 06/05/2019   Atrial fibrillation (HCC)    Atrial fibrillation with RVR (HCC) 06/29/2019   Bradycardia    Bradycardia 04/26/2018   Carotid artery disease (HCC)    Right CEA;  dopplers 6/12:  0-39% bilat ICA   Coronary artery disease    s/p BMS to RCA 2002; LHC 2008: oLAD 40%, pRCA stent ok with 20%, EF 60%); Myoview scan in March 2011  which was negative for ischemia    Coronary atherosclerosis 12/25/2008   Qualifier: Diagnosis of  By: Russella Dar MD Bronson Curb T    COVID-19 virus infection 06/05/2019   Dizziness 04/26/2018   Essential hypertension 10/08/2007   Qualifier: Diagnosis of  By: Genelle Gather CMA, [place]     GASTROESOPHAGEAL REFLUX DISEASE 10/08/2007   Qualifier: Diagnosis of  By: Genelle Gather CMA, [place]     GERD (gastroesophageal reflux disease)    Hyperlipidemia    Hypersomnia with sleep apnea 12/05/2015   Hypertension    MYOCARDIAL INFARCTION 02/03/2008   Qualifier: History of  By: Thad Ranger LPN, [place]     Myocardial infarction [facility])    Obstructive sleep apnea 02/03/2008   NPSG; 02/04/2008-mild OSA-RDI 5.1 per hour CPAP 11 cm H2O.        OSA on CPAP    Plantar fasciitis, bilateral 12/13/2012   Injection Right heel Feb '14    Preoperative clearance 04/26/2018   Syncope 06/05/2019   Typical atrial flutter (HCC)    Unstable angina (HCC) 12/02/2015   Past Surgical History:  Procedure Laterality Date   ACHILLES TENDON LENGTHENING Left 11/28/2020   Procedure: ACHILLES TENDON LENGTHENING;  Surgeon: Toni Arthurs, MD;  Location: [facility];  Service: Orthopedics;  Laterality: Left;   ATRIAL FIBRILLATION ABLATION N/A 08/01/2019   Procedure: ATRIAL FIBRILLATION ABLATION;  Surgeon: Hillis Range, MD;  Location: [facility] INVASIVE CV LAB;  Service: Cardiovascular;  Laterality: N/A;   CARDIAC CATHETERIZATION  06/01/2007    EF of 65% -- Nonobstructive CAD with 40% ostial stenosis in the LAD, no significant obstruction in the circumflex artery, 20% narrowing within the stent in the proximal to mid right coronary and normal LV function   CARDIAC CATHETERIZATION  02/02/2006   EF of 50%   CARDIAC CATHETERIZATION  01/19/2002   EF of 60%   CARDIAC CATHETERIZATION N/A 12/03/2015   Procedure: Left Heart Cath and Coronary Angiography;  Surgeon: Kathleene Hazel, MD;  Location: [facility] INVASIVE CV LAB;  Service: Cardiovascular;   Laterality: N/A;   CAROTID ENDARTERECTOMY  06/26/2002   right   CORONARY ANGIOPLASTY WITH STENT PLACEMENT  03/08/2000   Bare-metal stent in RCA, in [place]   KNEE ARTHROSCOPY     right   METATARSAL OSTEOTOMY Left 11/28/2020   Procedure: Plantar fascial release and First metatarsal dorsiflexion osteotomy;  Surgeon: Toni Arthurs, MD;  Location: [facility];  Service: Orthopedics;  Laterality: Left;   MOLE REMOVAL     Prior moles removed   TOTAL ANKLE ARTHROPLASTY Left 11/28/2020   Procedure: Left total ankle arthroplasty;  Surgeon: Toni Arthurs, MD;  Location: [facility];  Service: Orthopedics;  Laterality:  Left;   Family History  Problem Relation Age of Onset   Heart failure Father    Heart disease Father    Peripheral vascular disease Mother        with pacemaker, and had a carotid endarterectomy   Heart disease Mother    Hypertension Brother    Hypertension Brother    Social History   Socioeconomic History   Marital status: Married    Spouse name: Not on file   Number of children: 3   Years of education: 12   Highest education level: High school graduate  Occupational History   Occupation: Holiday representative  Tobacco Use   Smoking status: Former    Packs/day: 1.00    Years: 30.00    Additional pack years: 0.00    Total pack years: 30.00    Types: Cigarettes    Quit date: 10/19/2000    Years since quitting: 22.3   Smokeless tobacco: Current    Types: Snuff   Tobacco comments:    Dips snuff.   Vaping Use   Vaping Use: Never used  Substance and Sexual Activity   Alcohol use: Yes    Alcohol/week: 1.0 standard drink of alcohol    Types: 1 Cans of beer per week    Comment: Occasional beer   Drug use: No   Sexual activity: Yes  Other Topics Concern   Not on file  Social History Narrative   Lives with wife in a one story home.  Has 3 children.  Works in Holiday representative.  Education: high school. Patient is right handed.   Social Determinants of  Health   Financial Resource Strain: Low Risk  (02/18/2022)   Overall Financial Resource Strain (CARDIA)    Difficulty of Paying Living Expenses: Not hard at all  Food Insecurity: No Food Insecurity (02/23/2023)   Hunger Vital Sign    Worried About Running Out of Food in the Last Year: Never true    Ran Out of Food in the Last Year: Never true  Transportation Needs: No Transportation Needs (02/23/2023)   PRAPARE - Administrator, Civil Service (Medical): No    Lack of Transportation (Non-Medical): No  Physical Activity: Sufficiently Active (02/18/2022)   Exercise Vital Sign    Days of Exercise per Week: 7 days    Minutes of Exercise per Session: 60 min  Stress: No Stress Concern Present (02/18/2022)   [employer] of Occupational Health - Occupational Stress Questionnaire    Feeling of Stress : Not at all  Social Connections: Moderately Integrated (02/18/2022)   Social Connection and Isolation Panel [NHANES]    Frequency of Communication with Friends and Family: More than three times a week    Frequency of Social Gatherings with Friends and Family: More than three times a week    Attends Religious Services: Never    Database administrator or Organizations: Yes    Attends Engineer, structural: More than 4 times per year    Marital Status: Married    Tobacco Counseling Ready to quit: Not Answered Counseling given: Not Answered Tobacco comments: Dips snuff.    Clinical Intake:  Pre-visit preparation completed: Yes  Pain : No/denies pain  Nutritional Risks: None Diabetes: No  How often do you need to have someone help you when you read instructions, pamphlets, or other written materials from your doctor or pharmacy?: 1 - Never  Activities of Daily Living    02/23/2023    9:53 AM  In your  present state of health, do you have any difficulty performing the following activities:  Hearing? 0  Vision? 0  Difficulty concentrating or making decisions? 0  Walking  or climbing stairs? 0  Dressing or bathing? 0  Doing errands, shopping? 0  Preparing Food and eating ? N  Using the Toilet? N  In the past six months, have you accidently leaked urine? Y  Do you have problems with loss of bowel control? N  Managing your Medications? N  Managing your Finances? N  Housekeeping or managing your Housekeeping? N    Patient Care Team: Olive Bass, FNP as PCP - General (Internal Medicine) Kathleene Hazel, MD as PCP - Cardiology (Cardiology) Glendale Chard, DO as Consulting Physician (Neurology)  Indicate any recent Medical Services you may have received from other than [facility] providers in the past year (date may be approximate).     Assessment:   This is a routine wellness examination for [place].  Hearing/Vision screen No results found.  Dietary issues and exercise activities discussed: Current Exercise Habits: The patient does not participate in regular exercise at present, Exercise limited by: None identified   Goals Addressed   None    Depression Screen    02/23/2023    9:52 AM 04/14/2022    3:07 PM 02/18/2022    9:48 AM 09/30/2021    3:25 PM 04/01/2020   12:57 PM 10/21/2017    2:11 PM  PHQ 2/9 Scores  PHQ - 2 Score 0 0 0 0 0 0    Fall Risk    02/23/2023    9:51 AM 04/14/2022    3:07 PM 02/18/2022    9:47 AM 09/30/2021    3:25 PM 04/01/2020   12:57 PM  Fall Risk   Falls in the past year? 0 0 0 0 0  Number falls in past yr: 0 0 0 0 0  Injury with Fall? 0 0 0 0 0  Risk for fall due to : No Fall Risks  No Fall Risks No Fall Risks   Follow up Falls evaluation completed  Falls evaluation completed Falls evaluation completed     FALL RISK PREVENTION PERTAINING TO THE HOME:  Any stairs in or around the home? Yes  If so, are there any without handrails? No  Home free of loose throw rugs in walkways, pet beds, electrical cords, etc? Yes  Adequate lighting in your home to reduce risk of falls? Yes   ASSISTIVE DEVICES UTILIZED  TO PREVENT FALLS:  Life alert? No  Use of a cane, walker or w/c? No  Grab bars in the bathroom? No  Shower chair or bench in shower?  Built in seat Elevated toilet seat or a handicapped toilet? No   TIMED UP AND GO:  Was the test performed?  No, audio visit .    Cognitive Function:        02/23/2023    9:57 AM 02/18/2022    9:52 AM  6CIT Screen  What Year? 0 points 0 points  What month? 0 points 0 points  What time? 0 points 0 points  Count back from 20 0 points 0 points  Months in reverse 0 points 4 points  Repeat phrase 2 points 2 points  Total Score 2 points 6 points    Immunizations Immunization History  Administered Date(s) Administered   PFIZER(Purple Top)SARS-COV-2 Vaccination 12/02/2019, 11/22/2020   Tdap 04/03/2015    TDAP status: Up to date  Flu Vaccine status: Up to date  Pneumococcal vaccine status: Due, Education has been provided regarding the importance of this vaccine. Advised may receive this vaccine at local pharmacy or Health Dept. Aware to provide a copy of the vaccination record if obtained from local pharmacy or Health Dept. Verbalized acceptance and understanding.  Covid-19 vaccine status: Information provided on how to obtain vaccines.   Qualifies for Shingles Vaccine? Yes   Zostavax completed No   Shingrix Completed?: No.    Education has been provided regarding the importance of this vaccine. Patient has been advised to call insurance company to determine out of pocket expense if they have not yet received this vaccine. Advised may also receive vaccine at local pharmacy or Health Dept. Verbalized acceptance and understanding.  Screening Tests Health Maintenance  Topic Date Due   Pneumonia Vaccine 73+ Years old (1 of 2 - PCV) Never done   Zoster Vaccines- Shingrix (1 of 2) Never done   COVID-19 Vaccine (3 - Pfizer risk series) 12/20/2020   Medicare Annual Wellness (AWV)  02/19/2023   INFLUENZA VACCINE  12/05/2035 (Originally 05/20/2023)    Fecal DNA (Cologuard)  11/01/2023   DTaP/Tdap/Td (2 - Td or Tdap) 04/02/2025   Hepatitis C Screening  Completed   HPV VACCINES  Aged Out    Health Maintenance  Health Maintenance Due  Topic Date Due   Pneumonia Vaccine 71+ Years old (1 of 2 - PCV) Never done   Zoster Vaccines- Shingrix (1 of 2) Never done   COVID-19 Vaccine (3 - Pfizer risk series) 12/20/2020   Medicare Annual Wellness (AWV)  02/19/2023    Colorectal cancer screening: Type of screening: Cologuard. Completed 10/31/20. Repeat every 3 years  Lung Cancer Screening: (Low Dose CT Chest recommended if Age 68-80 years, 30 pack-year currently smoking OR have quit w/in 15years.) does not qualify.   Additional Screening:  Hepatitis C Screening: does qualify; Completed 09/30/21  Vision Screening: Recommended annual ophthalmology exams for early detection of glaucoma and other disorders of the eye. Is the patient up to date with their annual eye exam?  Yes  Who is the provider or what is the name of the office in which the patient attends annual eye exams? [facility] in [place] If pt is not established with a provider, would they like to be referred to a provider to establish care? No .   Dental Screening: Recommended annual dental exams for proper oral hygiene  Community Resource Referral / Chronic Care Management: CRR required this visit?  No   CCM required this visit?  No      Plan:     I have personally reviewed and noted the following in the patient's chart:   Medical and social history Use of alcohol, tobacco or illicit drugs  Current medications and supplements including opioid prescriptions. Patient is not currently taking opioid prescriptions. Functional ability and status Nutritional status Physical activity Advanced directives List of other physicians Hospitalizations, surgeries, and ER visits in previous 12 months Vitals Screenings to include cognitive, depression, and falls Referrals and  appointments  In addition, I have reviewed and discussed with patient certain preventive protocols, quality metrics, and best practice recommendations. A written personalized care plan for preventive services as well as general preventive health recommendations were provided to patient.   Due to this being a telephonic visit, the after visit summary with patients personalized plan was offered to patient via mail or my-chart.  Patient would like to access on my-chart.  Donne Anon, [place]   02/23/2023   Nurse  Notes: None

## 2023-03-10 DIAGNOSIS — L959 Vasculitis limited to the skin, unspecified: Secondary | ICD-10-CM | POA: Diagnosis not present

## 2023-03-10 DIAGNOSIS — R21 Rash and other nonspecific skin eruption: Secondary | ICD-10-CM | POA: Diagnosis not present

## 2023-03-10 DIAGNOSIS — R768 Other specified abnormal immunological findings in serum: Secondary | ICD-10-CM | POA: Diagnosis not present

## 2023-03-10 DIAGNOSIS — M329 Systemic lupus erythematosus, unspecified: Secondary | ICD-10-CM | POA: Diagnosis not present

## 2023-03-10 DIAGNOSIS — M549 Dorsalgia, unspecified: Secondary | ICD-10-CM | POA: Diagnosis not present

## 2023-03-10 DIAGNOSIS — M25562 Pain in left knee: Secondary | ICD-10-CM | POA: Diagnosis not present

## 2023-03-10 DIAGNOSIS — M1990 Unspecified osteoarthritis, unspecified site: Secondary | ICD-10-CM | POA: Diagnosis not present

## 2023-03-12 LAB — LAB REPORT - SCANNED: EGFR: 70

## 2023-03-14 ENCOUNTER — Other Ambulatory Visit: Payer: Self-pay | Admitting: Cardiovascular Disease

## 2023-04-16 ENCOUNTER — Encounter: Payer: Medicare Other | Admitting: Family

## 2023-04-20 ENCOUNTER — Encounter: Payer: Medicare Other | Admitting: Family

## 2023-04-30 ENCOUNTER — Ambulatory Visit: Payer: Medicare Other | Admitting: Family

## 2023-05-04 ENCOUNTER — Ambulatory Visit (INDEPENDENT_AMBULATORY_CARE_PROVIDER_SITE_OTHER): Payer: Medicare Other | Admitting: Family

## 2023-05-04 ENCOUNTER — Encounter: Payer: Self-pay | Admitting: Family

## 2023-05-04 VITALS — BP 128/62 | HR 55 | Ht 73.0 in | Wt 207.2 lb

## 2023-05-04 DIAGNOSIS — Z0000 Encounter for general adult medical examination without abnormal findings: Secondary | ICD-10-CM

## 2023-05-04 NOTE — Progress Notes (Signed)
[name] is a 72 y.o. male with the following history as recorded in EpicCare:  Patient Active Problem List   Diagnosis Date Noted   Coronary artery disease involving native coronary artery of native heart with angina pectoris with documented spasm (HCC) 09/09/2020   Atrial fibrillation with RVR (HCC) 06/29/2019   Syncope 06/05/2019   COVID-19 virus infection 06/05/2019   AKI (acute kidney injury) (HCC) 06/05/2019   Acute left ankle pain 08/05/2018   Atrial fibrillation (HCC) 05/30/2018   CAD in native artery    Preoperative clearance 04/26/2018   Bradycardia 04/26/2018   Dizziness 04/26/2018   Hypersomnia with sleep apnea 12/05/2015   Unstable angina (HCC) 12/02/2015   Plantar fasciitis, bilateral 12/13/2012   Carotid artery disease (HCC) 11/24/2010   BRADYCARDIA 04/12/2009   Coronary atherosclerosis 12/25/2008   Obstructive sleep apnea 02/03/2008   MYOCARDIAL INFARCTION 02/03/2008   Hyperlipidemia 10/08/2007   Essential hypertension 10/08/2007    Current Outpatient Medications  Medication Sig Dispense Refill   amLODipine (NORVASC) 10 MG tablet TAKE 1 TABLET BY MOUTH DAILY 100 tablet 2   b complex vitamins capsule Take 1 capsule by mouth daily.     docusate sodium (COLACE) 100 MG capsule Take 1 capsule (100 mg total) by mouth 2 (two) times daily. While taking narcotic pain medicine. 30 capsule 0   doxazosin (CARDURA) 2 MG tablet TAKE 1 TABLET BY MOUTH AT  BEDTIME 90 tablet 3   ELIQUIS 5 MG TABS tablet TAKE 1 TABLET BY MOUTH TWICE  DAILY 200 tablet 2   ezetimibe (ZETIA) 10 MG tablet TAKE 1 TABLET BY MOUTH DAILY 100 tablet 2   finasteride (PROSCAR) 5 MG tablet TAKE 1 TABLET BY MOUTH DAILY 90 tablet 3   hydrochlorothiazide (HYDRODIURIL) 25 MG tablet TAKE 1 TABLET BY MOUTH DAILY 100 tablet 0   metoprolol tartrate (LOPRESSOR) 25 MG tablet TAKE 1 TABLET BY MOUTH DAILY AS  NEEDED FOR PALPITATIONS AND AFIB 100 tablet 2   nitroGLYCERIN (NITROSTAT) 0.4 MG SL tablet Place 1 tablet (0.4  mg total) under the tongue every 5 (five) minutes as needed. Call 9-1-1 if need more than 2. 25 tablet 0   pantoprazole (PROTONIX) 40 MG tablet TAKE 1 TABLET BY MOUTH DAILY 100 tablet 2   potassium chloride (KLOR-CON M) 10 MEQ tablet TAKE 1 TABLET BY MOUTH DAILY 100 tablet 2   predniSONE (DELTASONE) 5 MG tablet Take 5 mg by mouth. Every other day     promethazine (PHENERGAN) 12.5 MG tablet Take 1 tablet by mouth every 6 (six) hours as needed.     ramipril (ALTACE) 10 MG capsule TAKE 1 CAPSULE BY MOUTH TWICE  DAILY 200 capsule 2   rosuvastatin (CRESTOR) 40 MG tablet TAKE 1 TABLET BY MOUTH DAILY 90 tablet 3   tiZANidine (ZANAFLEX) 2 MG tablet TAKE 1 TABLET BY MOUTH AT  BEDTIME AS NEEDED FOR PAIN IF  TOLERATING, OK TO TAKE TWICE  DAILY AS NEEDED 90 tablet 3   No current facility-administered medications for this visit.    Allergies: Felodipine and Niacin  Past Medical History:  Diagnosis Date   Acute left ankle pain 08/05/2018   AKI (acute kidney injury) (HCC) 06/05/2019   Atrial fibrillation (HCC)    Atrial fibrillation with RVR (HCC) 06/29/2019   Bradycardia    Bradycardia 04/26/2018   Carotid artery disease (HCC)    Right CEA;  dopplers 6/12:  0-39% bilat ICA   Coronary artery disease    s/p BMS to RCA 2002;  LHC 2008: oLAD 40%, pRCA stent ok with 20%, EF 60%); Myoview scan in March 2011 which was negative for ischemia    Coronary atherosclerosis 12/25/2008   Qualifier: Diagnosis of  By: Russella Dar MD Bronson Curb T    COVID-19 virus infection 06/05/2019   Dizziness 04/26/2018   Essential hypertension 10/08/2007   Qualifier: Diagnosis of  By: Genelle Gather CMA, [place]     GASTROESOPHAGEAL REFLUX DISEASE 10/08/2007   Qualifier: Diagnosis of  By: Genelle Gather CMA, [place]     GERD (gastroesophageal reflux disease)    Hyperlipidemia    Hypersomnia with sleep apnea 12/05/2015   Hypertension    MYOCARDIAL INFARCTION 02/03/2008   Qualifier: History of  By: Thad Ranger LPN, Megan     Myocardial infarction [facility])     Obstructive sleep apnea 02/03/2008   NPSG; 02/04/2008-mild OSA-RDI 5.1 per hour CPAP 11 cm H2O.        OSA on CPAP    Plantar fasciitis, bilateral 12/13/2012   Injection Right heel Feb '14    Preoperative clearance 04/26/2018   Syncope 06/05/2019   Typical atrial flutter (HCC)    Unstable angina (HCC) 12/02/2015    Past Surgical History:  Procedure Laterality Date   ACHILLES TENDON LENGTHENING Left 11/28/2020   Procedure: ACHILLES TENDON LENGTHENING;  Surgeon: Toni Arthurs, MD;  Location: [facility];  Service: Orthopedics;  Laterality: Left;   ATRIAL FIBRILLATION ABLATION N/A 08/01/2019   Procedure: ATRIAL FIBRILLATION ABLATION;  Surgeon: Hillis Range, MD;  Location: [facility];  Service: Cardiovascular;  Laterality: N/A;   CARDIAC CATHETERIZATION  06/01/2007    EF of 65% -- Nonobstructive CAD with 40% ostial stenosis in the LAD, no significant obstruction in the circumflex artery, 20% narrowing within the stent in the proximal to mid right coronary and normal LV function   CARDIAC CATHETERIZATION  02/02/2006   EF of 50%   CARDIAC CATHETERIZATION  01/19/2002   EF of 60%   CARDIAC CATHETERIZATION N/A 12/03/2015   Procedure: Left Heart Cath and Coronary Angiography;  Surgeon: Kathleene Hazel, MD;  Location: [facility];  Service: Cardiovascular;  Laterality: N/A;   CAROTID ENDARTERECTOMY  06/26/2002   right   CORONARY ANGIOPLASTY WITH STENT PLACEMENT  03/08/2000   Bare-metal stent in RCA, in [place]   KNEE ARTHROSCOPY     right   METATARSAL OSTEOTOMY Left 11/28/2020   Procedure: Plantar fascial release and First metatarsal dorsiflexion osteotomy;  Surgeon: Toni Arthurs, MD;  Location: [facility];  Service: Orthopedics;  Laterality: Left;   MOLE REMOVAL     Prior moles removed   TOTAL ANKLE ARTHROPLASTY Left 11/28/2020   Procedure: Left total ankle arthroplasty;  Surgeon: Toni Arthurs, MD;  Location: [facility];  Service:  Orthopedics;  Laterality: Left;    Family History  Problem Relation Age of Onset   Heart failure Father    Heart disease Father    Peripheral vascular disease Mother        with pacemaker, and had a carotid endarterectomy   Heart disease Mother    Hypertension Brother    Hypertension Brother     Social History   Tobacco Use   Smoking status: Former    Current packs/day: 0.00    Average packs/day: 1 pack/day for 30.0 years (30.0 ttl pk-yrs)    Types: Cigarettes    Start date: 10/19/1970    Quit date: 10/19/2000    Years since quitting: 22.5   Smokeless tobacco: Current  Types: Snuff   Tobacco comments:    Dips snuff.   Substance Use Topics   Alcohol use: Yes    Alcohol/week: 1.0 standard drink of alcohol    Types: 1 Cans of beer per week    Comment: Occasional beer    Subjective:   Presents for yearly CPE; accompanied by wife;  Has seen cardiology for yearly check up; under care of rheumatology; also sees urology regularly;  Dermatology in [place] yearly;  Defers updating any type vaccines today;   Review of Systems  Constitutional: Negative.   HENT: Negative.    Eyes: Negative.   Respiratory: Negative.    Cardiovascular: Negative.   Gastrointestinal: Negative.   Genitourinary: Negative.   Musculoskeletal: Negative.   Skin: Negative.   Neurological: Negative.   Endo/Heme/Allergies: Negative.   Psychiatric/Behavioral: Negative.       Objective:  Vitals:   05/04/23 1411  BP: 128/62  Pulse: (!) 55  SpO2: 98%  Weight: 207 lb 3.2 oz (94 kg)  Height: 6\' 1"  (1.854 m)    General: Well developed, well nourished, in no acute distress  Skin : Warm and dry.  Head: Normocephalic and atraumatic  Eyes: Sclera and conjunctiva clear; pupils round and reactive to light; extraocular movements intact  Ears: External normal; canals clear; tympanic membranes normal  Oropharynx: Pink, supple. No suspicious lesions  Neck: Supple without thyromegaly, adenopathy  Lungs:  Respirations unlabored; clear to auscultation bilaterally without wheeze, rales, rhonchi  CVS exam: normal rate and regular rhythm.  Abdomen: Soft; nontender; nondistended; normoactive bowel sounds; no masses or hepatosplenomegaly  Musculoskeletal: No deformities; no active joint inflammation  Extremities: No edema, cyanosis, clubbing  Vessels: Symmetric bilaterally  Neurologic: Alert and oriented; speech intact; face symmetrical; moves all extremities well; CNII-XII intact without focal deficit   Assessment:  1. PE (physical exam), annual     Plan:  Majority of care is managed by specialists; reviewed labs done by rheumatology and cardiology- agree that do not need to update labs today;  Age appropriate preventive healthcare needs addressed; encouraged regular eye doctor and dental exams; encouraged regular exercise; will updaterefills as needed today; follow-up in 1 year, sooner prn.    Return in about 1 year (around 05/03/2024).  No orders of the defined types were placed in this encounter.   Requested Prescriptions    No prescriptions requested or ordered in this encounter

## 2023-07-15 DIAGNOSIS — L931 Subacute cutaneous lupus erythematosus: Secondary | ICD-10-CM | POA: Diagnosis not present

## 2023-07-15 DIAGNOSIS — L570 Actinic keratosis: Secondary | ICD-10-CM | POA: Diagnosis not present

## 2023-08-09 ENCOUNTER — Telehealth: Payer: Self-pay | Admitting: Adult Health

## 2023-08-09 ENCOUNTER — Encounter: Payer: Self-pay | Admitting: Anesthesiology

## 2023-08-09 NOTE — Telephone Encounter (Signed)
Thank you :)

## 2023-08-09 NOTE — Telephone Encounter (Signed)
Called and scheduled pt to be seen soon.

## 2023-08-09 NOTE — Telephone Encounter (Signed)
Please contact patient to schedule yearly CPAP follow-up visit, okay to be MyChart video visit if patient is okay with this. Thank you.

## 2023-08-09 NOTE — Progress Notes (Unsigned)
PATIENT: [name] DOB: October 20, 1950  REASON FOR VISIT: follow up HISTORY FROM: patient  Virtual Visit via Video Note  I connected with [name] on 08/10/23 at 10:45 AM EDT by a video enabled telemedicine application and verified that I am speaking with the correct person using two identifiers.  Location: Patient: at home Provider: in office   I discussed the limitations of evaluation and management by telemedicine and the availability of in person appointments. The patient expressed understanding and agreed to proceed.  Follow-up visit:  Prior visit: 07/13/2022   Brief HPI: [name] is a 72 y.o. male with PMH of CAD s/p PCI, RCEA, GERD, HLD, and HTN who is being followed in this office for OSA on CPAP management.  He was initially evaluated by Dr. Vickey Huger on 07/14/2018 with prior diagnosis of OSA on CPAP and due to recent diagnosis of atrial fibrillation, was referred for ongoing apnea management.  He underwent HST in 05/2018 which confirmed mild OSA with AHI at 13 and set up with new autoPAP in 09/2018.   At prior visit, compliance report showed satisfactory compliance and optimal residual AHI.  Continue to tolerate CPAP well. Noted leaks but not bothersome to patient.     Interval history:  Past 30-day CPAP compliance report shows excellent usage and optimal residual AHI.  Leaks remain elevated but not bothersome.  Continues to tolerate CPAP well.  Routinely follows with DME [employer] and up to date on supplies. Will be due for new machine in December, agrees with repeat sleep study around that time in order to obtain new machine. No questions or concerns today.              REVIEW OF SYSTEMS: Out of a complete 14 system review of symptoms, the patient complains only of the following symptoms, apnea and all other reviewed systems are negative.    ALLERGIES: Allergies  Allergen Reactions   Felodipine Swelling    Legs became swollen   Niacin Other (See  Comments)    Made patient feel flushed    HOME MEDICATIONS: Outpatient Medications Prior to Visit  Medication Sig Dispense Refill   amLODipine (NORVASC) 10 MG tablet TAKE 1 TABLET BY MOUTH DAILY 100 tablet 2   b complex vitamins capsule Take 1 capsule by mouth daily.     docusate sodium (COLACE) 100 MG capsule Take 1 capsule (100 mg total) by mouth 2 (two) times daily. While taking narcotic pain medicine. 30 capsule 0   doxazosin (CARDURA) 2 MG tablet TAKE 1 TABLET BY MOUTH AT  BEDTIME 90 tablet 3   ELIQUIS 5 MG TABS tablet TAKE 1 TABLET BY MOUTH TWICE  DAILY 200 tablet 2   ezetimibe (ZETIA) 10 MG tablet TAKE 1 TABLET BY MOUTH DAILY 100 tablet 2   finasteride (PROSCAR) 5 MG tablet TAKE 1 TABLET BY MOUTH DAILY 90 tablet 3   hydrochlorothiazide (HYDRODIURIL) 25 MG tablet TAKE 1 TABLET BY MOUTH DAILY 100 tablet 0   metoprolol tartrate (LOPRESSOR) 25 MG tablet TAKE 1 TABLET BY MOUTH DAILY AS  NEEDED FOR PALPITATIONS AND AFIB 100 tablet 2   nitroGLYCERIN (NITROSTAT) 0.4 MG SL tablet Place 1 tablet (0.4 mg total) under the tongue every 5 (five) minutes as needed. Call 9-1-1 if need more than 2. 25 tablet 0   pantoprazole (PROTONIX) 40 MG tablet TAKE 1 TABLET BY MOUTH DAILY 100 tablet 2   potassium chloride (KLOR-CON M) 10 MEQ tablet TAKE 1 TABLET BY  MOUTH DAILY 100 tablet 2   predniSONE (DELTASONE) 5 MG tablet Take 5 mg by mouth. Every other day     promethazine (PHENERGAN) 12.5 MG tablet Take 1 tablet by mouth every 6 (six) hours as needed.     ramipril (ALTACE) 10 MG capsule TAKE 1 CAPSULE BY MOUTH TWICE  DAILY 200 capsule 2   rosuvastatin (CRESTOR) 40 MG tablet TAKE 1 TABLET BY MOUTH DAILY 90 tablet 3   tiZANidine (ZANAFLEX) 2 MG tablet TAKE 1 TABLET BY MOUTH AT  BEDTIME AS NEEDED FOR PAIN IF  TOLERATING, OK TO TAKE TWICE  DAILY AS NEEDED 90 tablet 3   No facility-administered medications prior to visit.    PAST MEDICAL HISTORY: Past Medical History:  Diagnosis Date   Acute left ankle  pain 08/05/2018   AKI (acute kidney injury) (HCC) 06/05/2019   Atrial fibrillation (HCC)    Atrial fibrillation with RVR (HCC) 06/29/2019   Bradycardia    Bradycardia 04/26/2018   Carotid artery disease (HCC)    Right CEA;  dopplers 6/12:  0-39% bilat ICA   Coronary artery disease    s/p BMS to RCA 2002; LHC 2008: oLAD 40%, pRCA stent ok with 20%, EF 60%); Myoview scan in March 2011 which was negative for ischemia    Coronary atherosclerosis 12/25/2008   Qualifier: Diagnosis of  By: Russella Dar MD Bronson Curb T    COVID-19 virus infection 06/05/2019   Dizziness 04/26/2018   Essential hypertension 10/08/2007   Qualifier: Diagnosis of  By: Genelle Gather CMA, [place]     GASTROESOPHAGEAL REFLUX DISEASE 10/08/2007   Qualifier: Diagnosis of  By: Genelle Gather CMA, [place]     GERD (gastroesophageal reflux disease)    Hyperlipidemia    Hypersomnia with sleep apnea 12/05/2015   Hypertension    MYOCARDIAL INFARCTION 02/03/2008   Qualifier: History of  By: Thad Ranger LPN, Megan     Myocardial infarction [facility])    Obstructive sleep apnea 02/03/2008   NPSG; 02/04/2008-mild OSA-RDI 5.1 per hour CPAP 11 cm H2O.        OSA on CPAP    Plantar fasciitis, bilateral 12/13/2012   Injection Right heel Feb '14    Preoperative clearance 04/26/2018   Syncope 06/05/2019   Typical atrial flutter (HCC)    Unstable angina (HCC) 12/02/2015    PAST SURGICAL HISTORY: Past Surgical History:  Procedure Laterality Date   ACHILLES TENDON LENGTHENING Left 11/28/2020   Procedure: ACHILLES TENDON LENGTHENING;  Surgeon: Toni Arthurs, MD;  Location: [facility];  Service: Orthopedics;  Laterality: Left;   ATRIAL FIBRILLATION ABLATION N/A 08/01/2019   Procedure: ATRIAL FIBRILLATION ABLATION;  Surgeon: Hillis Range, MD;  Location: [facility];  Service: Cardiovascular;  Laterality: N/A;   CARDIAC CATHETERIZATION  06/01/2007    EF of 65% -- Nonobstructive CAD with 40% ostial stenosis in the LAD, no significant obstruction in the  circumflex artery, 20% narrowing within the stent in the proximal to mid right coronary and normal LV function   CARDIAC CATHETERIZATION  02/02/2006   EF of 50%   CARDIAC CATHETERIZATION  01/19/2002   EF of 60%   CARDIAC CATHETERIZATION N/A 12/03/2015   Procedure: Left Heart Cath and Coronary Angiography;  Surgeon: Kathleene Hazel, MD;  Location: [facility] INVAS[facility] CV LAB;  Service: Cardiovascular;  Laterality: N/A;   CAROTID ENDARTERECTOMY  06/26/2002   right   CORONARY ANGIOPLASTY WITH STENT PLACEMENT  03/08/2000   Bare-metal stent in RCA, in [place]   KNEE ARTHROSCOPY  right   METATARSAL OSTEOTOMY Left 11/28/2020   Procedure: Plantar fascial release and First metatarsal dorsiflexion osteotomy;  Surgeon: Toni Arthurs, MD;  Location: [facility];  Service: Orthopedics;  Laterality: Left;   MOLE REMOVAL     Prior moles removed   TOTAL ANKLE ARTHROPLASTY Left 11/28/2020   Procedure: Left total ankle arthroplasty;  Surgeon: Toni Arthurs, MD;  Location: [facility];  Service: Orthopedics;  Laterality: Left;    FAMILY HISTORY: Family History  Problem Relation Age of Onset   Heart failure Father    Heart disease Father    Peripheral vascular disease Mother        with pacemaker, and had a carotid endarterectomy   Heart disease Mother    Hypertension Brother    Hypertension Brother     SOCIAL HISTORY: Social History   Socioeconomic History   Marital status: Married    Spouse name: Not on file   Number of children: 3   Years of education: 12   Highest education level: High school graduate  Occupational History   Occupation: Holiday representative  Tobacco Use   Smoking status: Former    Current packs/day: 0.00    Average packs/day: 1 pack/day for 30.0 years (30.0 ttl pk-yrs)    Types: Cigarettes    Start date: 10/19/1970    Quit date: 10/19/2000    Years since quitting: 22.8   Smokeless tobacco: Current    Types: Snuff   Tobacco comments:    Dips  snuff.   Vaping Use   Vaping status: Never Used  Substance and Sexual Activity   Alcohol use: Yes    Alcohol/week: 1.0 standard drink of alcohol    Types: 1 Cans of beer per week    Comment: Occasional beer   Drug use: No   Sexual activity: Yes  Other Topics Concern   Not on file  Social History Narrative   Lives with wife in a one story home.  Has 3 children.  Works in Holiday representative.  Education: high school. Patient is right handed.   Social Determinants of Health   Financial Resource Strain: Low Risk  (02/18/2022)   Overall Financial Resource Strain (CARDIA)    Difficulty of Paying Living Expenses: Not hard at all  Food Insecurity: No Food Insecurity (02/23/2023)   Hunger Vital Sign    Worried About Running Out of Food in the Last Year: Never true    Ran Out of Food in the Last Year: Never true  Transportation Needs: No Transportation Needs (02/23/2023)   PRAPARE - Administrator, Civil Service (Medical): No    Lack of Transportation (Non-Medical): No  Physical Activity: Sufficiently Active (02/18/2022)   Exercise Vital Sign    Days of Exercise per Week: 7 days    Minutes of Exercise per Session: 60 min  Stress: No Stress Concern Present (02/18/2022)   [employer] of Occupational Health - Occupational Stress Questionnaire    Feeling of Stress : Not at all  Social Connections: Moderately Integrated (02/18/2022)   Social Connection and Isolation Panel [NHANES]    Frequency of Communication with Friends and Family: More than three times a week    Frequency of Social Gatherings with Friends and Family: More than three times a week    Attends Religious Services: Never    Database administrator or Organizations: Yes    Attends Engineer, structural: More than 4 times per year    Marital Status: Married  Intimate Partner Violence: Not At Risk (02/23/2023)   Humiliation, Afraid, Rape, and Kick questionnaire    Fear of Current or Ex-Partner: No    Emotionally Abused:  No    Physically Abused: No    Sexually Abused: No      PHYSICAL EXAM N/A d/t visit type      ASSESSMENT AND PLAN [name] is a 72 y.o. male here for follow up regarding use of CPAP for sleep apnea management.  Current AutoPap set up 09/2018.    1.  OSA on CPAP -Continue AutoPap setting 7-13 with EPR level 1 as apnea continues to be well managed   -Due for new machine 09/2023 - will place orders for repeat HST in order to obtain new machine -Discussed importance of continued nightly usage with greater than 4 hours per night for optimal benefit -He also continue to follow with DME company [employer] for any supplies or CPAP related concerns.    Follow up in 30-90 days after receiving new CPAP, can resume yearly follow up visits thereafter    CC: Olive Bass, FNP   I spent 15 minutes of face-to-face and non-face-to-face time with patient via MyChart video visit.  This included previsit chart review, lab review, study review, order entry, electronic health record documentation, patient education and discussion regarding above diagnoses and treatment plan and answered all other questions to patient's satisfaction  Ihor Austin, [facility] [address] [place], [place] [address]  Phone [phone] Fax [phone] Note: This document was prepared with digital dictation and possible smart phrase technology. Any transcriptional errors that result from this process are unintentional.

## 2023-08-10 ENCOUNTER — Telehealth (INDEPENDENT_AMBULATORY_CARE_PROVIDER_SITE_OTHER): Payer: Medicare Other | Admitting: Adult Health

## 2023-08-10 ENCOUNTER — Encounter: Payer: Self-pay | Admitting: Adult Health

## 2023-08-10 DIAGNOSIS — G4733 Obstructive sleep apnea (adult) (pediatric): Secondary | ICD-10-CM | POA: Diagnosis not present

## 2023-08-12 ENCOUNTER — Other Ambulatory Visit: Payer: Self-pay | Admitting: Cardiovascular Disease

## 2023-08-12 ENCOUNTER — Other Ambulatory Visit: Payer: Self-pay | Admitting: Family

## 2023-08-12 ENCOUNTER — Telehealth: Payer: Self-pay

## 2023-08-20 ENCOUNTER — Other Ambulatory Visit: Payer: Self-pay | Admitting: Family

## 2023-08-23 ENCOUNTER — Ambulatory Visit (INDEPENDENT_AMBULATORY_CARE_PROVIDER_SITE_OTHER): Payer: Medicare Other | Admitting: Neurology

## 2023-08-23 DIAGNOSIS — G4733 Obstructive sleep apnea (adult) (pediatric): Secondary | ICD-10-CM

## 2023-09-02 NOTE — Progress Notes (Signed)
[facility]  [name] 72 year old male 1951-04-15   HOME SLEEP TEST REPORT ( by Tamela Oddi OUT Watch PAT)   STUDY DATE:  09-02-2023   ORDERING CLINICIAN: Ihor Austin, NP  For  Brandon Novas, MD  REFERRING CLINICIAN: PCP   CLINICAL INFORMATION/HISTORY: [name], Virtual Visit via Video Note   I connected with [name] on 08/10/23 at 10:45 AM EDT by a video enabled telemedicine application-[name] is a 72 y.o. male with PMH of CAD s/p PCI, RCEA, GERD, HLD, and HTN who is being followed in this office for OSA on CPAP management.  He was initially evaluated by Dr. Vickey Huger on 07/14/2018 with prior diagnosis of OSA on CPAP , and due to recent diagnosis of atrial fibrillation, was referred for ongoing apnea management.  He underwent HST in 05/2018 which confirmed mild OSA with AHI at 13 and set up with new autoPAP in 09/2018.  7-13 cm water, 1 cm EPR and 95% pressure at 12 cm H20 ,  residual AHI of 1.1/h , mask not specified. Atrial fibrillation:  status post ablation- 2020.    Needing a new machine.        Epworth sleepiness score:  NA /24. FSS at NA/ 63 points, GDS at NA/ 15 points    BMI: XYZ kg/m   Neck Circumference: not measured    FINDINGS:   Sleep Summary:   Total Recording Time (hours, min): 9 hours 40 minutes      Total Sleep Time (hours, min): 7 hours 20 minutes               Percent REM (%):   20%                                     Respiratory Indices:   Calculated pAHI (per hour):   12.4/h by AASM criteria and 5.5/h following CMS criteria                          REM pAHI:    7.7/h by CMS criteria                                             NREM pAHI: 5/h by CMS criteria                             Supine AHI:    6.2/h by CMS criteria     Snoring reached a mean volume of 41 dB and was present for 31% of total sleep time, 136 minutes.                                            Oxygen Saturation Statistics:   O2 Saturation Range (%):  Between the nadir of 87% of the maximal saturation of 99% with a mean saturation of 94%  O2 Saturation (minutes) <89%:   0.1 minutes        Pulse Rate Statistics:   Pulse Mean (bpm):     57 bpm            Pulse Range:  between 48 and 105 bpm             IMPRESSION:  This HST confirms the presence of mild and all obstructive sleep apnea according to the [facility] device.  Since the patient last was evaluated he has ended Medicare age and a different scoring criteria had to be implemented by which he falls under the mild sleep apnea category.  He also underwent ablation for atrial fibrillation which could have reduced his sleep apnea index significantly.  I am unaware of his current BMI, neck circumference.    RECOMMENDATION: Continuation of CPAP therapy is usually recommended and even in mild apnea when the patient has underlying atrial fibrillation. For this reason I would like to continue with CPAP therapy at the following settings of 7 through 14 cm water pressure, 2 cm EPR, heated humidification and an interface of patient's choice. The patient can follow-up with the nurse practitioner Ihor Austin in 3 months time.    INTERPRETING PHYSICIAN:   Brandon Novas, MD [facility] [address] [place], [place] [address]

## 2023-09-05 NOTE — Procedures (Signed)
[facility]  [name] 72 year old male 05/08/1951   HOME SLEEP TEST REPORT ( by [name] OUT Watch PAT)   STUDY DATE:  09-02-2023   ORDERING CLINICIAN: Ihor Austin, NP  For  Brandon Novas, MD  REFERRING CLINICIAN: PCP   CLINICAL INFORMATION/HISTORY: [name], Virtual Visit via Video Note   I connected with [name] on 08/10/23 at 10:45 AM EDT by a video enabled telemedicine application-[name] is a 72 y.o. male with PMH of CAD s/p PCI, RCEA, GERD, HLD, and HTN who is being followed in this office for OSA on CPAP management.  He was initially evaluated by Dr. Vickey Reid on 07/14/2018 with prior diagnosis of OSA on CPAP , and due to recent diagnosis of atrial fibrillation, was referred for ongoing apnea management.  He underwent HST in 05/2018 which confirmed mild OSA with AHI at 13 and set up with new autoPAP in 09/2018.  7-13 cm water, 1 cm EPR and 95% pressure at 12 cm H20 ,  residual AHI of 1.1/h , mask not specified. Atrial fibrillation:  status post ablation- 2020.    Needing a new machine.        Epworth sleepiness score:  NA /24. FSS at NA/ 63 points, GDS at NA/ 15 points    BMI: XYZ kg/m   Neck Circumference: not measured    FINDINGS:   Sleep Summary:   Total Recording Time (hours, min): 9 hours 40 minutes      Total Sleep Time (hours, min): 7 hours 20 minutes               Percent REM (%):   20%                                     Respiratory Indices:   Calculated pAHI (per hour):   12.4/h by AASM criteria and 5.5/h following CMS criteria                          REM pAHI:    7.7/h by CMS criteria                                             NREM pAHI: 5/h by CMS criteria                             Supine AHI:    6.2/h by CMS criteria     Snoring reached a mean volume of 41 dB and was present for 31% of total sleep time, 136 minutes.                                            Oxygen Saturation Statistics:   O2 Saturation Range (%):  Between the nadir of 87% of the maximal saturation of 99% with a mean saturation of 94%                                     O2 Saturation (minutes) <  89%:   0.1 minutes        Pulse Rate Statistics:   Pulse Mean (bpm):     57 bpm            Pulse Range:  between 48 and 105 bpm             IMPRESSION:  This HST confirms the presence of mild and all obstructive sleep apnea according to the [facility] device.  Since the patient last was evaluated he has ended Medicare age and a different scoring criteria had to be implemented by which he falls under the mild sleep apnea category.  He also underwent ablation for atrial fibrillation which could have reduced his sleep apnea index significantly.  I am unaware of his current BMI, neck circumference.    RECOMMENDATION: Continuation of CPAP therapy is usually recommended and even in mild apnea when the patient has underlying atrial fibrillation. For this reason I would like to continue with CPAP therapy at the following settings of 7 through 14 cm water pressure, 2 cm EPR, heated humidification and an interface of patient's choice.  CC: The patient can follow-up with NP ( nurse practitioner) [name] in 3 months time. Please place device orders with DME.     INTERPRETING PHYSICIAN:   Brandon Novas, MD [facility] [address] [place], [place] [address]

## 2023-09-06 ENCOUNTER — Telehealth: Payer: Self-pay | Admitting: Adult Health

## 2023-09-06 DIAGNOSIS — G4733 Obstructive sleep apnea (adult) (pediatric): Secondary | ICD-10-CM

## 2023-09-06 NOTE — Telephone Encounter (Signed)
Please advise patient that recent repeat sleep study showed overall mild sleep apnea and it is recommended to continue with CPAP therapy.  He is due for a new CPAP machine next month. Will place orders to initiate process but may need to hold off on obtaining new CPAP machine until next month.

## 2023-09-06 NOTE — Telephone Encounter (Signed)
Called patient to discuss sleep study results. No answer at this time. LVM for the patient to call back.  Will send a mychart message as well. 

## 2023-09-23 DIAGNOSIS — L7211 Pilar cyst: Secondary | ICD-10-CM | POA: Diagnosis not present

## 2023-09-27 DIAGNOSIS — H5203 Hypermetropia, bilateral: Secondary | ICD-10-CM | POA: Diagnosis not present

## 2023-09-27 DIAGNOSIS — H2513 Age-related nuclear cataract, bilateral: Secondary | ICD-10-CM | POA: Diagnosis not present

## 2023-09-28 ENCOUNTER — Other Ambulatory Visit: Payer: Self-pay | Admitting: Family

## 2023-11-15 ENCOUNTER — Other Ambulatory Visit: Payer: Self-pay | Admitting: Family

## 2023-11-23 ENCOUNTER — Other Ambulatory Visit: Payer: Self-pay | Admitting: Family

## 2023-11-23 ENCOUNTER — Other Ambulatory Visit: Payer: Self-pay | Admitting: Cardiovascular Disease

## 2023-11-24 ENCOUNTER — Other Ambulatory Visit: Payer: Self-pay | Admitting: Family

## 2023-11-24 DIAGNOSIS — I10 Essential (primary) hypertension: Secondary | ICD-10-CM

## 2024-01-04 NOTE — Progress Notes (Signed)
 Cardiology Office Note:  .   Date:  01/18/2024  ID:  [name], DOB September 04, 1951, MRN [id number] PCP: Olive Bass, FNP  [facility] Providers Cardiologist:  Verne Carrow, MD    History of Present Illness: .   [name] is a 73 y.o. male  with history of PAF, sleep apnea, CAD, HTN, hyperlipidemia and carotid artery disease s/p right CEA.     In 2002 he had a bare metal stent placed in the right coronary artery. A catheterization in 2008 showed non-obstructive disease. Had a Myoview scan in March 2011 which was negative for ischemia. Cardiac cath February 2017 with patent RCA stent and mild non-obstructive disease in the RCA, Circumflex and LAD. Nuclear stress test July 2019 with no ischemia. Echo August 2019 with LVEF=60-65%, grade 2 diastolic dysfunction. No significant valve disease. Carotid artery dopplers 2021 with mild bilateral carotid artery disease. Cardiac monitor in 2019 with nocturnal sinus bradycardia and short 3 second runs of SVT. He was admitted to [facility] August 2019 with atrial fibrillation with rapid ventricular response and was started on Eliquis. He converted to sinus in the ED after one dose of IV metoprolol. He underwent atrial fibrillation ablation in October 2020. Echo August 2020 with LVEF=60-65%. He has not been on a beta blocker due to bradycardia.   Patient comes in with his wife. Denies chest pain, palpitations, dyspnea, uses CPAP daily. Plays corn hole, horse shoes, fishes a lot. No smoking. Having a terrible time with allergies. Lost his brother to cancer yesterday.   ROS:    Studies Reviewed: Marland Kitchen    EKG Interpretation Date/Time:  Tuesday January 18 2024 12:14:51 EDT Ventricular Rate:  78 PR Interval:  168 QRS Duration:  104 QT Interval:  384 QTC Calculation: 437 R Axis:   -69  Text Interpretation: Normal sinus rhythm Left axis deviation When compared with ECG of 29-Aug-2019 13:09, Premature atrial complexes are no longer Present QRS axis  Shifted left Confirmed by Jacolyn Reedy [phone]) on 01/18/2024 12:33:36 PM    Prior CV Studies:   Echo August 2020:  1. The left ventricle has normal systolic function, with an ejection  fraction of 60-65%. The cavity size was normal. There is mildly increased  left ventricular wall thickness. Left ventricular diastolic Doppler  parameters are consistent with impaired  relaxation. Indeterminate filling pressures The E/e' is 8-15. No evidence  of left ventricular regional wall motion abnormalities.   2. The right ventricle has normal systolc function. The cavity was  normal. There is no increase in right ventricular wall thickness.   3. The mitral valve is grossly normal.   4. The tricuspid valve was grossly normal.   5. The aortic valve is tricuspid Mild sclerosis of the aortic valve. No  stenosis of the aortic valve.      Risk Assessment/Calculations:    CHA2DS2-VASc Score = 3   This indicates a 3.2% annual risk of stroke. The patient's score is based upon: CHF History: 0 HTN History: 1 Diabetes History: 0 Stroke History: 0 Vascular Disease History: 1 Age Score: 1 Gender Score: 0            Physical Exam:   VS:  BP (!) 120/58 (BP Location: Right Arm)   Pulse 78   Ht 6\' 1"  (1.854 m)   Wt 192 lb 12.8 oz (87.5 kg)   SpO2 97%   BMI 25.44 kg/m    Wt Readings from Last 3 Encounters:  01/18/24 192 lb 12.8  oz (87.5 kg)  05/04/23 207 lb 3.2 oz (94 kg)  01/20/23 212 lb (96.2 kg)    GEN: Well nourished, well developed in no acute distress NECK: No JVD; No carotid bruits CARDIAC:  RRR, no murmurs, rubs, gallops RESPIRATORY:  Clear to auscultation without rales, wheezing or rhonchi  ABDOMEN: Soft, non-tender, non-distended EXTREMITIES:  No edema; No deformity   ASSESSMENT AND PLAN: .    CAD BMS to the RCA in 2002, 2008 nonobstructive disease, Myoview 2011 no ischemia, cath 2017 patent RCA stent and mild nonobstructive disease in the RCA circumflex and LAD, NST 2019 no  ischemia- no recent angina.  Recommend 150 minutes of exercise weekly.   PAF status post A.fib ablation 07/2019 remains on Eliquis-no bleeding problems-needs labs    Hypertension blood pressure controlled   OSA uses CPAP every night   HLD needs FLP On Crestor and Zetia   Carotid disease status post R CEA stable in 2024   CKD stage III renal needs labs   History of bradycardia resolved with stopping atenolol   Tobacco abuse-dips tobacco. Recommend quitting  Seasonal allergies-claritin/flonase recommended               Dispo: f/u in 1 yr  Signed, Jacolyn Reedy, PA-C

## 2024-01-07 ENCOUNTER — Other Ambulatory Visit: Payer: Self-pay | Admitting: Family

## 2024-01-12 NOTE — Telephone Encounter (Signed)
 Patient has not yet received a call from DME and has been unable to contact them.  Can we please follow-up on this to ensure he receives a new CPAP machine? Thank you!

## 2024-01-12 NOTE — Telephone Encounter (Signed)
 I have sent a message to [employer] to tell them to check on order status for the pt

## 2024-01-18 ENCOUNTER — Encounter: Payer: Self-pay | Admitting: Physician Assistant

## 2024-01-18 ENCOUNTER — Ambulatory Visit: Payer: Medicare Other | Attending: Physician Assistant | Admitting: Physician Assistant

## 2024-01-18 VITALS — BP 120/58 | HR 78 | Ht 73.0 in | Wt 192.8 lb

## 2024-01-18 DIAGNOSIS — G4733 Obstructive sleep apnea (adult) (pediatric): Secondary | ICD-10-CM | POA: Diagnosis not present

## 2024-01-18 DIAGNOSIS — I10 Essential (primary) hypertension: Secondary | ICD-10-CM

## 2024-01-18 DIAGNOSIS — I6523 Occlusion and stenosis of bilateral carotid arteries: Secondary | ICD-10-CM

## 2024-01-18 DIAGNOSIS — J302 Other seasonal allergic rhinitis: Secondary | ICD-10-CM | POA: Diagnosis not present

## 2024-01-18 DIAGNOSIS — E782 Mixed hyperlipidemia: Secondary | ICD-10-CM | POA: Diagnosis not present

## 2024-01-18 DIAGNOSIS — I480 Paroxysmal atrial fibrillation: Secondary | ICD-10-CM

## 2024-01-18 DIAGNOSIS — I2510 Atherosclerotic heart disease of native coronary artery without angina pectoris: Secondary | ICD-10-CM | POA: Diagnosis not present

## 2024-01-18 NOTE — Patient Instructions (Signed)
 Medication Instructions:  Your physician recommends that you continue on your current medications as directed. Please refer to the Current Medication list given to you today.  *If you need a refill on your cardiac medications before your next appointment, please call your pharmacy*  Lab Work: CMET, FASTING LIPIDS, TSH If you have labs (blood work) drawn today and your tests are completely normal, you will receive your results only by: MyChart Message (if you have MyChart) OR A paper copy in the mail If you have any lab test that is abnormal or we need to change your treatment, we will call you to review the results.  Testing/Procedures: [id number]  Follow-Up: At [facility], you and your health needs are our priority.  As part of our continuing mission to provide you with exceptional heart care, our providers are all part of one team.  This team includes your primary Cardiologist (physician) and Advanced Practice Providers or APPs (Physician Assistants and Nurse Practitioners) who all work together to provide you with the care you need, when you need it.  Your next appointment:   1 year(s)  Provider:   Verne Carrow, MD   We recommend signing up for the patient portal called "MyChart".  Sign up information is provided on this After Visit Summary.  MyChart is used to connect with patients for Virtual Visits (Telemedicine).  Patients are able to view lab/test results, encounter notes, upcoming appointments, etc.  Non-urgent messages can be sent to your provider as well.   To learn more about what you can do with MyChart, go to [email or web address].   Other Instructions       1st Floor: - Lobby - Registration  - Pharmacy  - Lab - Cafe  2nd Floor: - PV Lab - Diagnostic Testing (echo, CT, nuclear med)  3rd Floor: - Vacant  4th Floor: - TCTS (cardiothoracic surgery) - AFib Clinic - Structural Heart Clinic - Vascular Surgery  - Vascular Ultrasound  5th  Floor: - HeartCare Cardiology (general and EP) - Clinical Pharmacy for coumadin, hypertension, lipid, weight-loss medications, and med management appointments    Valet parking services will be available as well.

## 2024-01-20 ENCOUNTER — Other Ambulatory Visit: Payer: Self-pay | Admitting: Cardiovascular Disease

## 2024-01-20 DIAGNOSIS — L218 Other seborrheic dermatitis: Secondary | ICD-10-CM | POA: Diagnosis not present

## 2024-01-20 DIAGNOSIS — L814 Other melanin hyperpigmentation: Secondary | ICD-10-CM | POA: Diagnosis not present

## 2024-01-20 DIAGNOSIS — L931 Subacute cutaneous lupus erythematosus: Secondary | ICD-10-CM | POA: Diagnosis not present

## 2024-01-20 DIAGNOSIS — D225 Melanocytic nevi of trunk: Secondary | ICD-10-CM | POA: Diagnosis not present

## 2024-01-20 DIAGNOSIS — L821 Other seborrheic keratosis: Secondary | ICD-10-CM | POA: Diagnosis not present

## 2024-01-24 ENCOUNTER — Other Ambulatory Visit: Payer: Self-pay | Admitting: Cardiovascular Disease

## 2024-02-24 ENCOUNTER — Encounter (HOSPITAL_COMMUNITY): Payer: Self-pay

## 2024-03-02 ENCOUNTER — Ambulatory Visit

## 2024-03-02 ENCOUNTER — Telehealth: Payer: Self-pay

## 2024-03-02 NOTE — Telephone Encounter (Signed)
 Unsuccessful attempts to reach patient on preferred number listed in notes for scheduled AWV. Left message on voicemail okay to reschedule.

## 2024-03-09 ENCOUNTER — Ambulatory Visit: Payer: Medicare Other | Admitting: Cardiovascular Disease

## 2024-04-29 ENCOUNTER — Other Ambulatory Visit: Payer: Self-pay | Admitting: Family

## 2024-05-02 ENCOUNTER — Ambulatory Visit (INDEPENDENT_AMBULATORY_CARE_PROVIDER_SITE_OTHER)

## 2024-05-02 VITALS — Ht 73.0 in | Wt 208.0 lb

## 2024-05-02 DIAGNOSIS — Z0000 Encounter for general adult medical examination without abnormal findings: Secondary | ICD-10-CM | POA: Diagnosis not present

## 2024-05-02 DIAGNOSIS — Z1211 Encounter for screening for malignant neoplasm of colon: Secondary | ICD-10-CM | POA: Diagnosis not present

## 2024-05-02 NOTE — Patient Instructions (Signed)
 Mr. Stille , Thank you for taking time out of your busy schedule to complete your Annual Wellness Visit with me. I enjoyed our conversation and look forward to speaking with you again next year. I, as well as your care team,  appreciate your ongoing commitment to your health goals. Please review the following plan we discussed and let me know if I can assist you in the future. Your Game plan/ To Do List    Referrals: If you haven't heard from the office you've been referred to, please reach out to them at the phone provided.   Follow up Visits: Next Medicare AWV with our clinical staff: 05/08/25 @ 10:50a   Have you seen your provider in the last 6 months (3 months if uncontrolled diabetes)?  Next Office Visit with your provider: 05/04/24 @ 1:20p  Clinician Recommendations:  Aim for 30 minutes of exercise or brisk walking, 6-8 glasses of water, and 5 servings of fruits and vegetables each day.       This is a list of the screening recommended for you and due dates:  Health Maintenance  Topic Date Due   COVID-19 Vaccine (3 - Pfizer risk series) 12/20/2020   Cologuard (Stool DNA test)  11/01/2023   Flu Shot  12/05/2035*   DTaP/Tdap/Td vaccine (2 - Td or Tdap) 04/02/2025   Medicare Annual Wellness Visit  05/02/2025   Hepatitis C Screening  Completed   Hepatitis B Vaccine  Aged Out   HPV Vaccine  Aged Out   Meningitis B Vaccine  Aged Out   Pneumococcal Vaccine for age over 15  Discontinued   Zoster (Shingles) Vaccine  Discontinued  *Topic was postponed. The date shown is not the original due date.    Advanced directives: (Copy Requested) Please bring a copy of your health care power of attorney and living will to the office to be added to your chart at your convenience. You can mail to [facility] [address] [place], [place] [address] or email to [email or web address] Advance Care Planning is important because it:  [x]  Makes sure you receive the medical care that is  consistent with your values, goals, and preferences  [x]  It provides guidance to your family and loved ones and reduces their decisional burden about whether or not they are making the right decisions based on your wishes.  Follow the link provided in your after visit summary or read over the paperwork we have mailed to you to help you started getting your Advance Directives in place. If you need assistance in completing these, please reach out to us  so that we can help you!  See attachments for Preventive Care and Fall Prevention Tips.

## 2024-05-02 NOTE — Progress Notes (Signed)
 Subjective:   [name] is a 73 y.o. who presents for a Medicare Wellness preventive visit.  As a reminder, Annual Wellness Visits don't include a physical exam, and some assessments may be limited, especially if this visit is performed virtually. We may recommend an in-person follow-up visit with your provider if needed.  Visit Complete: Virtual I connected with  [name] on 05/02/24 by a audio enabled telemedicine application and verified that I am speaking with the correct person using two identifiers.  Patient Location: Home  Provider Location: Home Office  I discussed the limitations of evaluation and management by telemedicine. The patient expressed understanding and agreed to proceed.  Vital Signs: Because this visit was a virtual/telehealth visit, some criteria may be missing or patient reported. Any vitals not documented were not able to be obtained and vitals that have been documented are patient reported.    Persons Participating in Visit: Patient.  AWV Questionnaire: No: Patient Medicare AWV questionnaire was not completed prior to this visit.  Cardiac Risk Factors include: advanced age (>19men, >44 women);male gender;hypertension     Objective:    Today's Vitals   05/02/24 1100  Weight: 208 lb (94.3 kg)  Height: 6' 1 (1.854 m)   Body mass index is 27.44 kg/m.     05/02/2024   11:07 AM 02/23/2023    9:49 AM 02/18/2022    9:47 AM 11/28/2020    6:33 AM 11/21/2020    2:17 PM 04/01/2020   12:56 PM 08/01/2019    9:18 AM  Advanced Directives  Does Patient Have a Medical Advance Directive? Yes Yes No Yes Yes No Yes  Type of Estate agent of [place];Living will Healthcare Power of [place];Living will  Healthcare Power of [place];Living will   Living will  Does patient want to make changes to medical advance directive?    No - Patient declined No - Patient declined  No - Patient declined  Copy of Healthcare Power of Attorney in Chart? No -  copy requested No - copy requested  No - copy requested     Would patient like information on creating a medical advance directive?   No - Patient declined        Current Medications (verified) Outpatient Encounter Medications as of 05/02/2024  Medication Sig   amLODipine  (NORVASC ) 10 MG tablet TAKE 1 TABLET BY MOUTH DAILY   b complex vitamins capsule Take 1 capsule by mouth daily.   desonide (DESOWEN) 0.05 % cream Apply topically every other day.   docusate sodium  (COLACE) 100 MG capsule Take 1 capsule (100 mg total) by mouth 2 (two) times daily. While taking narcotic pain medicine.   doxazosin  (CARDURA ) 2 MG tablet TAKE 1 TABLET BY MOUTH AT  BEDTIME   ELIQUIS  5 MG TABS tablet TAKE 1 TABLET BY MOUTH TWICE  DAILY   ezetimibe  (ZETIA ) 10 MG tablet TAKE 1 TABLET BY MOUTH DAILY   finasteride  (PROSCAR ) 5 MG tablet TAKE 1 TABLET BY MOUTH DAILY   hydrochlorothiazide  (HYDRODIURIL ) 25 MG tablet TAKE 1 TABLET BY MOUTH DAILY   metoprolol  tartrate (LOPRESSOR ) 25 MG tablet TAKE 1 TABLET BY MOUTH DAILY AS  NEEDED FOR PALPITATIONS AND AFIB   nitroGLYCERIN  (NITROSTAT ) 0.4 MG SL tablet Place 1 tablet (0.4 mg total) under the tongue every 5 (five) minutes as needed. Call 9-1-1 if need more than 2.   pantoprazole  (PROTONIX ) 40 MG tablet TAKE 1 TABLET BY MOUTH DAILY   potassium chloride  (KLOR-CON  M) 10 MEQ  tablet TAKE 1 TABLET BY MOUTH DAILY   predniSONE (DELTASONE) 5 MG tablet Take 5 mg by mouth. Every other day   promethazine (PHENERGAN) 12.5 MG tablet Take 1 tablet by mouth every 6 (six) hours as needed.   ramipril  (ALTACE ) 10 MG capsule TAKE 1 CAPSULE BY MOUTH TWICE  DAILY   rosuvastatin  (CRESTOR ) 40 MG tablet TAKE 1 TABLET BY MOUTH DAILY   tiZANidine  (ZANAFLEX ) 2 MG tablet TAKE 1 TABLET BY MOUTH AT  BEDTIME AS NEEDED FOR PAIN IF  TOLERATING, OK TO TAKE TWICE  DAILY AS NEEDED   No facility-administered encounter medications on file as of 05/02/2024.    Allergies (verified) Felodipine and Niacin    History: Past Medical History:  Diagnosis Date   Acute left ankle pain 08/05/2018   AKI (acute kidney injury) (HCC) 06/05/2019   Atrial fibrillation (HCC)    Atrial fibrillation with RVR (HCC) 06/29/2019   Bradycardia    Bradycardia 04/26/2018   Carotid artery disease (HCC)    Right CEA;  dopplers 6/12:  0-39% bilat ICA   Coronary artery disease    s/p BMS to RCA 2002; LHC 2008: oLAD 40%, pRCA stent ok with 20%, EF 60%); Myoview  scan in March 2011 which was negative for ischemia    Coronary atherosclerosis 12/25/2008   Qualifier: Diagnosis of  By: Aneita MD NOLIA Gist T    COVID-19 virus infection 06/05/2019   Dizziness 04/26/2018   Essential hypertension 10/08/2007   Qualifier: Diagnosis of  By: Nickola CMA, [place]     GASTROESOPHAGEAL REFLUX DISEASE 10/08/2007   Qualifier: Diagnosis of  By: Nickola CMA, [place]     GERD (gastroesophageal reflux disease)    Hyperlipidemia    Hypersomnia with sleep apnea 12/05/2015   Hypertension    MYOCARDIAL INFARCTION 02/03/2008   Qualifier: History of  By: Germaine LPN, [place]     Myocardial infarction (HCC)    Obstructive sleep apnea 02/03/2008   NPSG; 02/04/2008-mild OSA-RDI 5.1 per hour CPAP 11 cm H2O.        OSA on CPAP    Plantar fasciitis, bilateral 12/13/2012   Injection Right heel Feb '14    Preoperative clearance 04/26/2018   Syncope 06/05/2019   Typical atrial flutter (HCC)    Unstable angina (HCC) 12/02/2015   Past Surgical History:  Procedure Laterality Date   ACHILLES TENDON LENGTHENING Left 11/28/2020   Procedure: ACHILLES TENDON LENGTHENING;  Surgeon: Kit Rush, MD;  Location: [facility];  Service: Orthopedics;  Laterality: Left;   ATRIAL FIBRILLATION ABLATION N/A 08/01/2019   Procedure: ATRIAL FIBRILLATION ABLATION;  Surgeon: Kelsie Agent, MD;  Location: [facility];  Service: Cardiovascular;  Laterality: N/A;   CARDIAC CATHETERIZATION  06/01/2007    EF of 65% -- Nonobstructive CAD with 40% ostial stenosis  in the LAD, no significant obstruction in the circumflex artery, 20% narrowing within the stent in the proximal to mid right coronary and normal LV function   CARDIAC CATHETERIZATION  02/02/2006   EF of 50%   CARDIAC CATHETERIZATION  01/19/2002   EF of 60%   CARDIAC CATHETERIZATION N/A 12/03/2015   Procedure: Left Heart Cath and Coronary Angiography;  Surgeon: Lonni JONETTA Cash, MD;  Location: [facility];  Service: Cardiovascular;  Laterality: N/A;   CAROTID ENDARTERECTOMY  06/26/2002   right   CORONARY ANGIOPLASTY WITH STENT PLACEMENT  03/08/2000   Bare-metal stent in RCA, in [place]   KNEE ARTHROSCOPY     right   METATARSAL OSTEOTOMY Left  11/28/2020   Procedure: Plantar fascial release and First metatarsal dorsiflexion osteotomy;  Surgeon: Kit Rush, MD;  Location: [facility];  Service: Orthopedics;  Laterality: Left;   MOLE REMOVAL     Prior moles removed   TOTAL ANKLE ARTHROPLASTY Left 11/28/2020   Procedure: Left total ankle arthroplasty;  Surgeon: Kit Rush, MD;  Location: [facility];  Service: Orthopedics;  Laterality: Left;   Family History  Problem Relation Age of Onset   Heart failure Father    Heart disease Father    Peripheral vascular disease Mother        with pacemaker, and had a carotid endarterectomy   Heart disease Mother    Hypertension Brother    Hypertension Brother    Social History   Socioeconomic History   Marital status: Married    Spouse name: Not on file   Number of children: 3   Years of education: 12   Highest education level: High school graduate  Occupational History   Occupation: Holiday representative  Tobacco Use   Smoking status: Former    Current packs/day: 0.00    Average packs/day: 1 pack/day for 30.0 years (30.0 ttl pk-yrs)    Types: Cigarettes    Start date: 10/19/1970    Quit date: 10/19/2000    Years since quitting: 23.5   Smokeless tobacco: Current    Types: Snuff   Tobacco comments:     Dips snuff.   Vaping Use   Vaping status: Never Used  Substance and Sexual Activity   Alcohol use: Yes    Alcohol/week: 1.0 standard drink of alcohol    Types: 1 Cans of beer per week    Comment: Occasional beer   Drug use: No   Sexual activity: Yes  Other Topics Concern   Not on file  Social History Narrative   Lives with wife in a one story home.  Has 3 children.  Works in Holiday representative.  Education: high school. Patient is right handed.   Social Drivers of Corporate investment banker Strain: Low Risk  (05/02/2024)   Overall Financial Resource Strain (CARDIA)    Difficulty of Paying Living Expenses: Not hard at all  Food Insecurity: No Food Insecurity (05/02/2024)   Hunger Vital Sign    Worried About Running Out of Food in the Last Year: Never true    Ran Out of Food in the Last Year: Never true  Transportation Needs: No Transportation Needs (05/02/2024)   PRAPARE - Administrator, Civil Service (Medical): No    Lack of Transportation (Non-Medical): No  Physical Activity: Sufficiently Active (05/02/2024)   Exercise Vital Sign    Days of Exercise per Week: 7 days    Minutes of Exercise per Session: 150+ min  Stress: No Stress Concern Present (05/02/2024)   [employer] of Occupational Health - Occupational Stress Questionnaire    Feeling of Stress: Not at all  Social Connections: Socially Integrated (05/02/2024)   Social Connection and Isolation Panel    Frequency of Communication with Friends and Family: More than three times a week    Frequency of Social Gatherings with Friends and Family: More than three times a week    Attends Religious Services: More than 4 times per year    Active Member of [employer] or Organizations: Yes    Attends Engineer, structural: More than 4 times per year    Marital Status: Married    Tobacco Counseling Ready to quit: No Counseling  given: Yes Tobacco comments: Dips snuff.     Clinical Intake:  Pre-visit preparation  completed: Yes  Pain : No/denies pain     Nutritional Risks: None Diabetes: No  Lab Results  Component Value Date   HGBA1C 6.2 04/01/2018     How often do you need to have someone help you when you read instructions, pamphlets, or other written materials from your doctor or pharmacy?: 1 - Never  Interpreter Needed?: No  Information entered by :: Rojelio Blush LPN   Activities of Daily Living     05/02/2024   11:06 AM  In your present state of health, do you have any difficulty performing the following activities:  Hearing? 0  Vision? 0  Difficulty concentrating or making decisions? 0  Walking or climbing stairs? 0  Dressing or bathing? 0  Doing errands, shopping? 0  Preparing Food and eating ? N  Using the Toilet? N  In the past six months, have you accidently leaked urine? N  Do you have problems with loss of bowel control? N  Managing your Medications? N  Managing your Finances? N  Housekeeping or managing your Housekeeping? N    Patient Care Team: Jason Leita Repine, FNP as PCP - General (Internal Medicine) Verlin Lonni BIRCH, MD as PCP - Cardiology (Cardiology) Patel, Donika K, DO as Consulting Physician (Neurology)  I have updated your Care Teams any recent Medical Services you may have received from other providers in the past year.     Assessment:   This is a routine wellness examination for [name].  Hearing/Vision screen Hearing Screening - Comments:: Denies hearing difficulties   Vision Screening - Comments:: Wears rx glasses - up to date with routine eye exams with  [facility]   Goals Addressed               This Visit's Progress     Remain active (pt-stated)         Depression Screen     05/02/2024   11:05 AM 02/23/2023    9:52 AM 04/14/2022    3:07 PM 02/18/2022    9:48 AM 09/30/2021    3:25 PM 04/01/2020   12:57 PM 10/21/2017    2:11 PM  PHQ 2/9 Scores  PHQ - 2 Score 0 0 0 0 0 0 0    Fall Risk     05/02/2024   11:06  AM 02/23/2023    9:51 AM 04/14/2022    3:07 PM 02/18/2022    9:47 AM 09/30/2021    3:25 PM  Fall Risk   Falls in the past year? 0 0 0 0 0  Number falls in past yr: 0 0 0 0 0  Injury with Fall? 0 0 0 0 0  Risk for fall due to : No Fall Risks No Fall Risks  No Fall Risks No Fall Risks  Follow up Falls evaluation completed Falls evaluation completed  Falls evaluation completed  Falls evaluation completed      Data saved with a previous flowsheet row definition    MEDICARE RISK AT HOME:  Medicare Risk at Home Any stairs in or around the home?: Yes If so, are there any without handrails?: No Home free of loose throw rugs in walkways, pet beds, electrical cords, etc?: Yes Adequate lighting in your home to reduce risk of falls?: Yes Life alert?: No Use of a cane, walker or w/c?: No Grab bars in the bathroom?: No Shower chair or bench in shower?: No  Elevated toilet seat or a handicapped toilet?: No  TIMED UP AND GO:  Was the test performed?  No  Cognitive Function: 6CIT completed        05/02/2024   11:07 AM 02/23/2023    9:57 AM 02/18/2022    9:52 AM  6CIT Screen  What Year? 0 points 0 points 0 points  What month? 0 points 0 points 0 points  What time? 0 points 0 points 0 points  Count back from 20 0 points 0 points 0 points  Months in reverse 0 points 0 points 4 points  Repeat phrase 0 points 2 points 2 points  Total Score 0 points 2 points 6 points    Immunizations Immunization History  Administered Date(s) Administered   PFIZER(Purple Top)SARS-COV-2 Vaccination 12/02/2019, 11/22/2020   Tdap 04/03/2015    Screening Tests Health Maintenance  Topic Date Due   COVID-19 Vaccine (3 - Pfizer risk series) 12/20/2020   Fecal DNA (Cologuard)  11/01/2023   INFLUENZA VACCINE  12/05/2035 (Originally 05/19/2024)   DTaP/Tdap/Td (2 - Td or Tdap) 04/02/2025   Medicare Annual Wellness (AWV)  05/02/2025   Hepatitis C Screening  Completed   Hepatitis B Vaccines  Aged Out   HPV VACCINES   Aged Out   Meningococcal B Vaccine  Aged Out   Pneumococcal Vaccine: 50+ Years  Discontinued   Zoster Vaccines- Shingrix  Discontinued    Health Maintenance  Health Maintenance Due  Topic Date Due   COVID-19 Vaccine (3 - Pfizer risk series) 12/20/2020   Fecal DNA (Cologuard)  11/01/2023   Health Maintenance Items Addressed: Cologuard Ordered  Additional Screening:  Vision Screening: Recommended annual ophthalmology exams for early detection of glaucoma and other disorders of the eye. Would you like a referral to an eye doctor? No    Dental Screening: Recommended annual dental exams for proper oral hygiene  Community Resource Referral / Chronic Care Management: CRR required this visit?  No   CCM required this visit?  No   Plan:    I have personally reviewed and noted the following in the patient's chart:   Medical and social history Use of alcohol, tobacco or illicit drugs  Current medications and supplements including opioid prescriptions. Patient is not currently taking opioid prescriptions. Functional ability and status Nutritional status Physical activity Advanced directives List of other physicians Hospitalizations, surgeries, and ER visits in previous 12 months Vitals Screenings to include cognitive, depression, and falls Referrals and appointments  In addition, I have reviewed and discussed with patient certain preventive protocols, quality metrics, and best practice recommendations. A written personalized care plan for preventive services as well as general preventive health recommendations were provided to patient.   Rojelio LELON Blush, LPN   2/84/7974   After Visit Summary: (MyChart) Due to this being a telephonic visit, the after visit summary with patients personalized plan was offered to patient via MyChart   Notes: Nothing significant to report at this time.

## 2024-05-04 ENCOUNTER — Encounter: Payer: Medicare Other | Admitting: Family

## 2024-06-04 LAB — COLOGUARD: COLOGUARD: NEGATIVE

## 2024-06-05 ENCOUNTER — Ambulatory Visit: Payer: Self-pay | Admitting: Family

## 2024-06-11 ENCOUNTER — Other Ambulatory Visit: Payer: Self-pay | Admitting: Family

## 2024-08-04 ENCOUNTER — Other Ambulatory Visit: Payer: Self-pay | Admitting: Family

## 2024-08-04 DIAGNOSIS — I10 Essential (primary) hypertension: Secondary | ICD-10-CM

## 2024-08-04 MED ORDER — METOPROLOL TARTRATE 25 MG PO TABS: ORAL_TABLET | ORAL | 2 refills | Status: AC

## 2024-08-04 MED ORDER — RAMIPRIL 10 MG PO CAPS: 10.0000 mg | ORAL_CAPSULE | Freq: Two times a day (BID) | ORAL | 2 refills | Status: AC

## 2024-08-04 MED ORDER — POTASSIUM CHLORIDE CRYS ER 10 MEQ PO TBCR: 10.0000 meq | EXTENDED_RELEASE_TABLET | Freq: Every day | ORAL | 2 refills | Status: AC

## 2024-08-04 NOTE — Telephone Encounter (Unsigned)
 Copied from CRM [id number]. Topic: Clinical - Medication Refill >> Aug 04, 2024  8:11 AM Franky GRADE wrote: Medication: potassium chloride  (KLOR-CON  M) 10 MEQ tablet [[id number]], pantoprazole  (PROTONIX ) 40 MG tablet [[id number]], ramipril  (ALTACE ) 10 MG capsule [[id number]],metoprolol  tartrate (LOPRESSOR ) 25 MG tablet [[id number]],$[id number]_[id number]$$[id number]_[id number]$  (ZETIA ) 10 MG tablet [[id number]]  Has the patient contacted their pharmacy? Yes, they asked patient to contact the office.  (Agent: If no, request that the patient contact the pharmacy for the refill. If patient does not wish to contact the pharmacy document the reason why and proceed with request.) (Agent: If yes, when and what did the pharmacy advise?)  This is the patient's preferred pharmacy:    [facility] - [place], [place] - [address] [facility] [address] [place] [address] [place] [address] Phone: [phone] Fax: [phone]   Is this the correct pharmacy for this prescription? Yes If no, delete pharmacy and type the correct one.   Has the prescription been filled recently? No  Is the patient out of the medication? No, patient has a weeks left  Has the patient been seen for an appointment in the last year OR does the patient have an upcoming appointment? Yes  Can we respond through MyChart? Yes  Agent: Please be advised that Rx refills may take up to 3 business days. We ask that you follow-up with your pharmacy.

## 2024-08-14 ENCOUNTER — Encounter: Payer: Self-pay | Admitting: Student

## 2024-08-14 DIAGNOSIS — I10 Essential (primary) hypertension: Secondary | ICD-10-CM

## 2024-09-25 DIAGNOSIS — N400 Enlarged prostate without lower urinary tract symptoms: Secondary | ICD-10-CM | POA: Insufficient documentation

## 2024-09-25 DIAGNOSIS — M329 Systemic lupus erythematosus, unspecified: Secondary | ICD-10-CM | POA: Insufficient documentation

## 2024-09-25 NOTE — Assessment & Plan Note (Deleted)
 On Statin and Zetia . Encourage heart healthy diet such as MIND or DASH diet, increase exercise, avoid trans fats, simple carbohydrates and processed foods, consider a krill or fish or flaxseed oil cap daily.

## 2024-09-25 NOTE — Assessment & Plan Note (Deleted)
 Follows with Rheumatology

## 2024-09-25 NOTE — Assessment & Plan Note (Deleted)
 On finasteride . Asymptomatic.

## 2024-09-25 NOTE — Assessment & Plan Note (Deleted)
 Stable on eliquis . Managed by cardiology.

## 2024-09-25 NOTE — Assessment & Plan Note (Deleted)
 Well controlled, no changes to meds. Encouraged heart healthy diet such as the DASH diet and exercise as tolerated.

## 2024-09-25 NOTE — Progress Notes (Deleted)
 Subjective:     Patient ID: [name], male    DOB: 10/17/1951, 73 y.o.   MRN: [id number]  No chief complaint on file.   HPI  Discussed the use of AI scribe software for clinical note transcription with the patient, who gave verbal consent to proceed.  History of Present Illness         Smoking? ETOH?  OSA- CPAP?  SLE-- rhumatology?  BPH- fnateride??---hx BPH?   Patient denies fever, chills, SOB, CP, palpitations, dyspnea, edema, HA, vision changes, N/V/D, abdominal pain, urinary symptoms, rash, weight changes, and recent illness or hospitalizations.   Health Maintenance Due  Topic Date Due   COVID-19 Vaccine (3 - Pfizer risk series) 12/20/2020    Past Medical History:  Diagnosis Date   Acute left ankle pain 08/05/2018   AKI (acute kidney injury) 06/05/2019   Atrial fibrillation (HCC)    Atrial fibrillation with RVR (HCC) 06/29/2019   Bradycardia    Bradycardia 04/26/2018   Carotid artery disease    Right CEA;  dopplers 6/12:  0-39% bilat ICA   Coronary artery disease    s/p BMS to RCA 2002; LHC 2008: oLAD 40%, pRCA stent ok with 20%, EF 60%); Myoview  scan in March 2011 which was negative for ischemia    Coronary atherosclerosis 12/25/2008   Qualifier: Diagnosis of  By: Aneita MD NOLIA Gist T    COVID-19 virus infection 06/05/2019   Dizziness 04/26/2018   Essential hypertension 10/08/2007   Qualifier: Diagnosis of  By: Nickola CMA, [place]     GASTROESOPHAGEAL REFLUX DISEASE 10/08/2007   Qualifier: Diagnosis of  By: Nickola CMA, [place]     GERD (gastroesophageal reflux disease)    Hyperlipidemia    Hypersomnia with sleep apnea 12/05/2015   Hypertension    MYOCARDIAL INFARCTION 02/03/2008   Qualifier: History of  By: Germaine LPN, Megan     Myocardial infarction (HCC)    Obstructive sleep apnea 02/03/2008   NPSG; 02/04/2008-mild OSA-RDI 5.1 per hour CPAP 11 cm H2O.        OSA on CPAP    Plantar fasciitis, bilateral 12/13/2012   Injection Right heel Feb '14     Preoperative clearance 04/26/2018   Syncope 06/05/2019   Typical atrial flutter (HCC)    Unstable angina (HCC) 12/02/2015    Past Surgical History:  Procedure Laterality Date   ACHILLES TENDON LENGTHENING Left 11/28/2020   Procedure: ACHILLES TENDON LENGTHENING;  Surgeon: Kit Rush, MD;  Location: [facility];  Service: Orthopedics;  Laterality: Left;   ATRIAL FIBRILLATION ABLATION N/A 08/01/2019   Procedure: ATRIAL FIBRILLATION ABLATION;  Surgeon: Kelsie Lynwood, MD;  Location: [facility];  Service: Cardiovascular;  Laterality: N/A;   CARDIAC CATHETERIZATION  06/01/2007    EF of 65% -- Nonobstructive CAD with 40% ostial stenosis in the LAD, no significant obstruction in the circumflex artery, 20% narrowing within the stent in the proximal to mid right coronary and normal LV function   CARDIAC CATHETERIZATION  02/02/2006   EF of 50%   CARDIAC CATHETERIZATION  01/19/2002   EF of 60%   CARDIAC CATHETERIZATION N/A 12/03/2015   Procedure: Left Heart Cath and Coronary Angiography;  Surgeon: Lonni JONETTA Cash, MD;  Location: [facility];  Service: Cardiovascular;  Laterality: N/A;   CAROTID ENDARTERECTOMY  06/26/2002   right   CORONARY ANGIOPLASTY WITH STENT PLACEMENT  03/08/2000   Bare-metal stent in RCA, in [place]   KNEE ARTHROSCOPY     right  METATARSAL OSTEOTOMY Left 11/28/2020   Procedure: Plantar fascial release and First metatarsal dorsiflexion osteotomy;  Surgeon: Kit Rush, MD;  Location: [facility];  Service: Orthopedics;  Laterality: Left;   MOLE REMOVAL     Prior moles removed   TOTAL ANKLE ARTHROPLASTY Left 11/28/2020   Procedure: Left total ankle arthroplasty;  Surgeon: Kit Rush, MD;  Location: [facility];  Service: Orthopedics;  Laterality: Left;    Family History  Problem Relation Age of Onset   Heart failure Father    Heart disease Father    Peripheral vascular disease Mother        with pacemaker,  and had a carotid endarterectomy   Heart disease Mother    Hypertension Brother    Hypertension Brother     Social History   Socioeconomic History   Marital status: Married    Spouse name: Not on file   Number of children: 3   Years of education: 12   Highest education level: High school graduate  Occupational History   Occupation: holiday representative  Tobacco Use   Smoking status: Former    Current packs/day: 0.00    Average packs/day: 1 pack/day for 30.0 years (30.0 ttl pk-yrs)    Types: Cigarettes    Start date: 10/19/1970    Quit date: 10/19/2000    Years since quitting: 23.9   Smokeless tobacco: Current    Types: Snuff   Tobacco comments:    Dips snuff.   Vaping Use   Vaping status: Never Used  Substance and Sexual Activity   Alcohol use: Yes    Alcohol/week: 1.0 standard drink of alcohol    Types: 1 Cans of beer per week    Comment: Occasional beer   Drug use: No   Sexual activity: Yes  Other Topics Concern   Not on file  Social History Narrative   Lives with wife in a one story home.  Has 3 children.  Works in holiday representative.  Education: high school. Patient is right handed.   Social Drivers of Corporate Investment Banker Strain: Low Risk  (05/02/2024)   Overall Financial Resource Strain (CARDIA)    Difficulty of Paying Living Expenses: Not hard at all  Food Insecurity: No Food Insecurity (05/02/2024)   Hunger Vital Sign    Worried About Running Out of Food in the Last Year: Never true    Ran Out of Food in the Last Year: Never true  Transportation Needs: No Transportation Needs (05/02/2024)   PRAPARE - Administrator, Civil Service (Medical): No    Lack of Transportation (Non-Medical): No  Physical Activity: Sufficiently Active (05/02/2024)   Exercise Vital Sign    Days of Exercise per Week: 7 days    Minutes of Exercise per Session: 150+ min  Stress: No Stress Concern Present (05/02/2024)   [employer] of Occupational Health - Occupational Stress  Questionnaire    Feeling of Stress: Not at all  Social Connections: Socially Integrated (05/02/2024)   Social Connection and Isolation Panel    Frequency of Communication with Friends and Family: More than three times a week    Frequency of Social Gatherings with Friends and Family: More than three times a week    Attends Religious Services: More than 4 times per year    Active Member of [employer] or Organizations: Yes    Attends Engineer, Structural: More than 4 times per year    Marital Status: Married  Catering Manager Violence:  Not At Risk (05/02/2024)   Humiliation, Afraid, Rape, and Kick questionnaire    Fear of Current or Ex-Partner: No    Emotionally Abused: No    Physically Abused: No    Sexually Abused: No    Outpatient Medications Prior to Visit  Medication Sig Dispense Refill   amLODipine  (NORVASC ) 10 MG tablet TAKE 1 TABLET BY MOUTH DAILY 100 tablet 0   b complex vitamins capsule Take 1 capsule by mouth daily.     desonide (DESOWEN) 0.05 % cream Apply topically every other day.     docusate sodium  (COLACE) 100 MG capsule Take 1 capsule (100 mg total) by mouth 2 (two) times daily. While taking narcotic pain medicine. 30 capsule 0   doxazosin  (CARDURA ) 2 MG tablet TAKE 1 TABLET BY MOUTH AT  BEDTIME 100 tablet 2   ELIQUIS  5 MG TABS tablet TAKE 1 TABLET BY MOUTH TWICE  DAILY 200 tablet 2   ezetimibe  (ZETIA ) 10 MG tablet Take 1 tablet (10 mg total) by mouth daily. 100 tablet 2   finasteride  (PROSCAR ) 5 MG tablet TAKE 1 TABLET BY MOUTH DAILY 100 tablet 2   hydrochlorothiazide  (HYDRODIURIL ) 25 MG tablet TAKE 1 TABLET BY MOUTH DAILY 100 tablet 3   metoprolol  tartrate (LOPRESSOR ) 25 MG tablet TAKE 1 TABLET BY MOUTH  DAILY AS NEEDED FOR  PALPITATIONS AND AFIB 100 tablet 2   nitroGLYCERIN  (NITROSTAT ) 0.4 MG SL tablet Place 1 tablet (0.4 mg total) under the tongue every 5 (five) minutes as needed. Call 9-1-1 if need more than 2. 25 tablet 0   pantoprazole  (PROTONIX ) 40 MG tablet  Take 1 tablet (40 mg total) by mouth daily. 100 tablet 2   potassium chloride  (KLOR-CON  M) 10 MEQ tablet Take 1 tablet (10 mEq total) by mouth daily. 100 tablet 2   predniSONE (DELTASONE) 5 MG tablet Take 5 mg by mouth. Every other day     promethazine (PHENERGAN) 12.5 MG tablet Take 1 tablet by mouth every 6 (six) hours as needed.     ramipril  (ALTACE ) 10 MG capsule Take 1 capsule (10 mg total) by mouth 2 (two) times daily. 200 capsule 2   rosuvastatin  (CRESTOR ) 40 MG tablet TAKE 1 TABLET BY MOUTH DAILY 100 tablet 2   tiZANidine  (ZANAFLEX ) 2 MG tablet TAKE 1 TABLET BY MOUTH AT  BEDTIME AS NEEDED FOR PAIN IF  TOLERATING, OK TO TAKE TWICE  DAILY AS NEEDED 90 tablet 3   No facility-administered medications prior to visit.    Allergies  Allergen Reactions   Felodipine Swelling    Legs became swollen   Niacin Other (See Comments)    Made patient feel flushed    ROS    See HPI Objective:    Physical Exam Vitals reviewed.  Constitutional:      General: He is not in acute distress.    Appearance: He is not toxic-appearing.  HENT:     Head: Normocephalic and atraumatic.     Mouth/Throat:     Mouth: Mucous membranes are moist.     Pharynx: Oropharynx is clear.  Eyes:     Pupils: Pupils are equal, round, and reactive to light.  Cardiovascular:     Rate and Rhythm: Normal rate and regular rhythm.     Pulses: Normal pulses.     Heart sounds: Normal heart sounds. No murmur heard. Pulmonary:     Effort: Pulmonary effort is normal. No respiratory distress.     Breath sounds: Normal breath sounds. No wheezing.  Musculoskeletal:        General: No swelling.     Cervical back: Neck supple.  Skin:    General: Skin is warm and dry.  Neurological:     General: No focal deficit present.     Mental Status: He is alert and oriented to person, place, and time.  Psychiatric:        Mood and Affect: Mood normal.        Behavior: Behavior normal.        Thought Content: Thought content  normal.        Judgment: Judgment normal.      There were no vitals taken for this visit. Wt Readings from Last 3 Encounters:  05/02/24 208 lb (94.3 kg)  01/18/24 192 lb 12.8 oz (87.5 kg)  05/04/23 207 lb 3.2 oz (94 kg)       Assessment & Plan:   Problem List Items Addressed This Visit     Atrial fibrillation (HCC) (Chronic)   Stable on eliquis . Managed by cardiology.      Benign prostatic hyperplasia   On finasteride . Asymptomatic.       Essential hypertension (Chronic)   Well controlled, no changes to meds. Encouraged heart healthy diet such as the DASH diet and exercise as tolerated.        Hyperlipidemia   On Statin and Zetia . Encourage heart healthy diet such as MIND or DASH diet, increase exercise, avoid trans fats, simple carbohydrates and processed foods, consider a krill or fish or flaxseed oil cap daily.        Obstructive sleep apnea   Systemic lupus erythematosus (HCC) - Primary   Follows with Rheumatology.      Other Visit Diagnoses       Screening for malignant neoplasm of prostate           I am having Jakaden A. Kellogg maintain his b complex vitamins, docusate sodium , nitroGLYCERIN , tiZANidine , predniSONE, promethazine, Eliquis , desonide, hydrochlorothiazide , finasteride , doxazosin , rosuvastatin , amLODipine , potassium chloride , pantoprazole , ramipril , metoprolol  tartrate, and ezetimibe .  No orders of the defined types were placed in this encounter.

## 2024-09-27 ENCOUNTER — Encounter: Admitting: Student

## 2024-09-27 DIAGNOSIS — G4733 Obstructive sleep apnea (adult) (pediatric): Secondary | ICD-10-CM

## 2024-09-27 DIAGNOSIS — I480 Paroxysmal atrial fibrillation: Secondary | ICD-10-CM

## 2024-09-27 DIAGNOSIS — E785 Hyperlipidemia, unspecified: Secondary | ICD-10-CM

## 2024-09-27 DIAGNOSIS — N400 Enlarged prostate without lower urinary tract symptoms: Secondary | ICD-10-CM

## 2024-09-27 DIAGNOSIS — M329 Systemic lupus erythematosus, unspecified: Secondary | ICD-10-CM

## 2024-09-27 DIAGNOSIS — Z125 Encounter for screening for malignant neoplasm of prostate: Secondary | ICD-10-CM

## 2024-09-27 DIAGNOSIS — I10 Essential (primary) hypertension: Secondary | ICD-10-CM

## 2024-11-14 ENCOUNTER — Encounter: Payer: Self-pay | Admitting: Student

## 2024-11-17 ENCOUNTER — Ambulatory Visit: Admitting: Student

## 2025-01-04 ENCOUNTER — Encounter: Admitting: Student

## 2025-05-08 ENCOUNTER — Ambulatory Visit
